# Patient Record
Sex: Female | Born: 1935
Health system: Southern US, Community
[De-identification: ages and names within clinical notes are randomized; demographics above are authoritative.]

## PROBLEM LIST (undated history)

## (undated) DIAGNOSIS — R233 Spontaneous ecchymoses: Secondary | ICD-10-CM

## (undated) DIAGNOSIS — M1990 Unspecified osteoarthritis, unspecified site: Secondary | ICD-10-CM

## (undated) DIAGNOSIS — E785 Hyperlipidemia, unspecified: Secondary | ICD-10-CM

## (undated) DIAGNOSIS — I2510 Atherosclerotic heart disease of native coronary artery without angina pectoris: Secondary | ICD-10-CM

## (undated) DIAGNOSIS — R5383 Other fatigue: Secondary | ICD-10-CM

## (undated) DIAGNOSIS — R0602 Shortness of breath: Secondary | ICD-10-CM

## (undated) DIAGNOSIS — I10 Essential (primary) hypertension: Secondary | ICD-10-CM

## (undated) DIAGNOSIS — R002 Palpitations: Secondary | ICD-10-CM

## (undated) DIAGNOSIS — R238 Other skin changes: Secondary | ICD-10-CM

## (undated) DIAGNOSIS — M25569 Pain in unspecified knee: Secondary | ICD-10-CM

## (undated) DIAGNOSIS — E669 Obesity, unspecified: Secondary | ICD-10-CM

## (undated) DIAGNOSIS — M549 Dorsalgia, unspecified: Secondary | ICD-10-CM

## (undated) HISTORY — DX: Essential (primary) hypertension: I10

## (undated) HISTORY — DX: Palpitations: R00.2

## (undated) HISTORY — DX: Other fatigue: R53.83

## (undated) HISTORY — DX: Hyperlipidemia, unspecified: E78.5

## (undated) HISTORY — PX: VAGINAL HYSTERECTOMY: SUR661

## (undated) HISTORY — DX: Spontaneous ecchymoses: R23.3

## (undated) HISTORY — DX: Other skin changes: R23.8

## (undated) HISTORY — DX: Dorsalgia, unspecified: M54.9

## (undated) HISTORY — DX: Pain in unspecified knee: M25.569

## (undated) HISTORY — DX: Unspecified osteoarthritis, unspecified site: M19.90

## (undated) HISTORY — DX: Atherosclerotic heart disease of native coronary artery without angina pectoris: I25.10

## (undated) HISTORY — PX: BREAST BIOPSY: SHX20

## (undated) HISTORY — DX: Shortness of breath: R06.02

## (undated) HISTORY — DX: Obesity, unspecified: E66.9

---

## 1980-12-18 HISTORY — PX: OTHER SURGICAL HISTORY: SHX169

## 1998-04-22 ENCOUNTER — Other Ambulatory Visit: Admission: RE | Admit: 1998-04-22 | Discharge: 1998-04-22 | Payer: Self-pay | Admitting: *Deleted

## 1998-08-17 ENCOUNTER — Ambulatory Visit (HOSPITAL_COMMUNITY): Admission: RE | Admit: 1998-08-17 | Discharge: 1998-08-17 | Payer: Self-pay | Admitting: *Deleted

## 1999-08-22 ENCOUNTER — Emergency Department (HOSPITAL_COMMUNITY): Admission: EM | Admit: 1999-08-22 | Discharge: 1999-08-22 | Payer: Self-pay | Admitting: Emergency Medicine

## 1999-08-22 ENCOUNTER — Encounter: Payer: Self-pay | Admitting: Emergency Medicine

## 1999-11-25 ENCOUNTER — Ambulatory Visit (HOSPITAL_COMMUNITY): Admission: RE | Admit: 1999-11-25 | Discharge: 1999-11-25 | Payer: Self-pay | Admitting: *Deleted

## 1999-11-25 ENCOUNTER — Encounter: Payer: Self-pay | Admitting: *Deleted

## 2000-06-12 ENCOUNTER — Ambulatory Visit (HOSPITAL_COMMUNITY): Admission: RE | Admit: 2000-06-12 | Discharge: 2000-06-12 | Payer: Self-pay | Admitting: Gastroenterology

## 2000-11-27 ENCOUNTER — Ambulatory Visit (HOSPITAL_COMMUNITY): Admission: RE | Admit: 2000-11-27 | Discharge: 2000-11-27 | Payer: Self-pay | Admitting: *Deleted

## 2000-12-26 ENCOUNTER — Ambulatory Visit (HOSPITAL_COMMUNITY): Admission: RE | Admit: 2000-12-26 | Discharge: 2000-12-26 | Payer: Self-pay | Admitting: Gastroenterology

## 2001-12-02 ENCOUNTER — Ambulatory Visit (HOSPITAL_COMMUNITY): Admission: RE | Admit: 2001-12-02 | Discharge: 2001-12-02 | Payer: Self-pay | Admitting: *Deleted

## 2002-12-03 ENCOUNTER — Ambulatory Visit (HOSPITAL_COMMUNITY): Admission: RE | Admit: 2002-12-03 | Discharge: 2002-12-03 | Payer: Self-pay | Admitting: Internal Medicine

## 2002-12-03 ENCOUNTER — Encounter: Payer: Self-pay | Admitting: Internal Medicine

## 2003-02-24 ENCOUNTER — Encounter: Payer: Self-pay | Admitting: Internal Medicine

## 2003-02-24 ENCOUNTER — Encounter: Admission: RE | Admit: 2003-02-24 | Discharge: 2003-02-24 | Payer: Self-pay | Admitting: Internal Medicine

## 2003-12-07 ENCOUNTER — Ambulatory Visit (HOSPITAL_COMMUNITY): Admission: RE | Admit: 2003-12-07 | Discharge: 2003-12-07 | Payer: Self-pay | Admitting: Gynecology

## 2004-12-08 ENCOUNTER — Ambulatory Visit (HOSPITAL_COMMUNITY): Admission: RE | Admit: 2004-12-08 | Discharge: 2004-12-08 | Payer: Self-pay | Admitting: Internal Medicine

## 2005-03-29 ENCOUNTER — Other Ambulatory Visit: Admission: RE | Admit: 2005-03-29 | Discharge: 2005-03-29 | Payer: Self-pay | Admitting: Gynecology

## 2006-01-26 ENCOUNTER — Ambulatory Visit (HOSPITAL_COMMUNITY): Admission: RE | Admit: 2006-01-26 | Discharge: 2006-01-26 | Payer: Self-pay | Admitting: Internal Medicine

## 2006-02-22 ENCOUNTER — Encounter: Admission: RE | Admit: 2006-02-22 | Discharge: 2006-02-22 | Payer: Self-pay | Admitting: Internal Medicine

## 2006-05-11 ENCOUNTER — Inpatient Hospital Stay (HOSPITAL_BASED_OUTPATIENT_CLINIC_OR_DEPARTMENT_OTHER): Admission: RE | Admit: 2006-05-11 | Discharge: 2006-05-11 | Payer: Self-pay | Admitting: Cardiology

## 2006-05-11 HISTORY — PX: CARDIAC CATHETERIZATION: SHX172

## 2007-03-19 ENCOUNTER — Encounter: Admission: RE | Admit: 2007-03-19 | Discharge: 2007-03-19 | Payer: Self-pay | Admitting: Internal Medicine

## 2008-03-19 ENCOUNTER — Encounter: Admission: RE | Admit: 2008-03-19 | Discharge: 2008-03-19 | Payer: Self-pay | Admitting: Internal Medicine

## 2008-08-29 ENCOUNTER — Encounter: Admission: RE | Admit: 2008-08-29 | Discharge: 2008-08-29 | Payer: Self-pay | Admitting: Orthopaedic Surgery

## 2008-09-18 ENCOUNTER — Encounter: Admission: RE | Admit: 2008-09-18 | Discharge: 2008-09-18 | Payer: Self-pay | Admitting: Orthopaedic Surgery

## 2008-09-22 ENCOUNTER — Ambulatory Visit (HOSPITAL_BASED_OUTPATIENT_CLINIC_OR_DEPARTMENT_OTHER): Admission: RE | Admit: 2008-09-22 | Discharge: 2008-09-22 | Payer: Self-pay | Admitting: Orthopaedic Surgery

## 2008-09-22 HISTORY — PX: KNEE ARTHROSCOPY: SUR90

## 2008-11-04 ENCOUNTER — Ambulatory Visit: Payer: Self-pay | Admitting: Vascular Surgery

## 2009-05-12 ENCOUNTER — Encounter: Admission: RE | Admit: 2009-05-12 | Discharge: 2009-05-12 | Payer: Self-pay | Admitting: Internal Medicine

## 2010-05-25 ENCOUNTER — Encounter: Admission: RE | Admit: 2010-05-25 | Discharge: 2010-05-25 | Payer: Self-pay | Admitting: Internal Medicine

## 2011-03-13 ENCOUNTER — Other Ambulatory Visit: Payer: Self-pay | Admitting: Cardiology

## 2011-03-16 NOTE — Telephone Encounter (Signed)
[facility] °

## 2011-04-14 ENCOUNTER — Other Ambulatory Visit: Payer: Self-pay | Admitting: Cardiology

## 2011-04-14 DIAGNOSIS — I10 Essential (primary) hypertension: Secondary | ICD-10-CM

## 2011-04-14 MED ORDER — METOPROLOL TARTRATE 100 MG PO TABS: 100.0000 mg | ORAL_TABLET | Freq: Two times a day (BID) | ORAL | Status: DC

## 2011-04-14 NOTE — Telephone Encounter (Signed)
LM on Home number that prescription for Metoprolol was called in to [facility] in [place].

## 2011-04-14 NOTE — Telephone Encounter (Signed)
PATIENT OUT OF METOPROLOL USES [facility] IN [place] [phone]. CALL PT AND LET HER KNOW. SAID SHE HAD ALREADY MENTIONED IT TO DR Deborah Chalk.

## 2011-05-02 NOTE — Op Note (Signed)
Tanya Ho, Tanya Ho                  ACCOUNT NO.:  [id number]   MEDICAL RECORD NO.:  [id number]          PATIENT TYPE:  AMB   LOCATION:  DSC                          FACILITY:  [facility]   PHYSICIAN:  Lubertha Basque. Dalldorf, M.D.DATE OF BIRTH:  [birth year]   DATE OF PROCEDURE:  09/22/2008  DATE OF DISCHARGE:                               OPERATIVE REPORT   PREOPERATIVE DIAGNOSES:  1. Right knee torn lateral and torn medial menisci.  2. Right knee chondromalacia patella.   POSTOPERATIVE DIAGNOSES:  1. Right knee torn lateral and torn medial menisci.  2. Right knee chondromalacia patella.   PROCEDURES:  1. Right knee partial medial and partial lateral meniscectomy.  2. Right knee abrasion chondroplasty, patellofemoral.   ANESTHESIA:  General.   ATTENDING SURGEON:  Lubertha Basque. Jerl Santos, MD   ASSISTANT:  Lindwood Qua, PA   INDICATIONS FOR PROCEDURE:  The patient is an 77 year old woman with a  long history of right knee pain.  This has persisted despite oral anti-  inflammatories and 2 injections.  By MRI scan, she has things amenable  to arthroscopy, though she does have some mild arthritic change on plain  films.  She has pain which limits her ability to rest and walk and she  is offered an arthroscopy.  Informed operative consent was obtained  after discussion of possible complications including reaction to  anesthesia and infection.   SUMMARY/FINDINGS/PROCEDURE:  Under general anesthesia, a right knee  arthroscopy was performed.  Suprapatellar pouch was benign while the  patellofemoral joint exhibited some grade 3 change across broad areas  and focal grade 4 change at the apex of the patella addressed with an  abrasion chondroplasty to bleeding bone in tiny areas.  The medial  compartment exhibited a degenerative tear of the posterior horn of the  medial meniscus addressed with a 5% partial medial meniscectomy.  The  lateral compartment exhibited a significant tear involving most of  the  meniscus.  The entire posterior horn was removed and a portion of the  middle horn taking about 50% of the lateral meniscus.  She had some  grade 4 change focally on the posterior aspect of the femoral condyle  addressed with a chondroplasty.  The ACL appeared to be intact.   DESCRIPTION OF PROCEDURE:  The patient was taken to the operating suite  where general anesthetic was applied without difficulty.  She was  positioned supine and prepped and draped in normal sterile fashion.  After the administration of IV Kefzol, an arthroscopy of the right knee  was performed through total of 2 portals.  Findings were as noted above  and procedure consisted of the medial and partial lateral meniscectomies  as described.  This was done with baskets and shavers.  I also performed  the chondroplasty and abrasion to bleeding bone, patellofemoral.  The  knee was thoroughly irrigated followed by placement of Marcaine with  epinephrine and morphine.  Adaptic was placed over the portals followed  by dry gauze and loose Ace wrap.  Estimated blood loss and  intraoperative fluids can be obtained from anesthesia  records.   DISPOSITION:  The patient was extubated in the operating room and taken  to recovery in stable addition.  She wished to go home same-day and  follow up in the office closely.  I will contact her by phone tonight.      Lubertha Basque Jerl Santos, M.D.  Electronically Signed     PGD/MEDQ  D:  09/22/2008  T:  09/22/2008  Job:  [id number]

## 2011-05-02 NOTE — Procedures (Signed)
CAROTID DUPLEX EXAM   INDICATION:  Right carotid bruit (per encounter form).   HISTORY:  Diabetes:  No.  Cardiac:  No.  Hypertension:  Yes.  Smoking:  No.  Previous Surgery:  No.  CV History:  Asymptomatic.  Amaurosis Fugax No, Paresthesias No, Hemiparesis No.                                       RIGHT             LEFT  Brachial systolic pressure:         138               142  Brachial Doppler waveforms:         Normal            Normal  Vertebral direction of flow:        Antegrade         Antegrade  DUPLEX VELOCITIES (cm/sec)  CCA peak systolic                   105               79  ECA peak systolic                   79                60  ICA peak systolic                   76                103  ICA end diastolic                   20                20  PLAQUE MORPHOLOGY:                  Mixed             Mixed  PLAQUE AMOUNT:                      Mild              Mild  PLAQUE LOCATION:                    ICA/ECA           ICA/ECA   IMPRESSION:  1. 1-39% stenosis of the bilateral internal carotid arteries.  2. No significant change in Doppler velocities noted when compared to      the previous examination on 07/31/2006.   Preliminary report was faxed to Dr. Ferd Hibbs office on 11/04/2008.   ___________________________________________  Larina Earthly, M.D.   CH/MEDQ  D:  11/04/2008  T:  11/04/2008  Job:  [id number]

## 2011-05-04 ENCOUNTER — Encounter: Payer: Self-pay | Admitting: Cardiology

## 2011-05-04 DIAGNOSIS — R233 Spontaneous ecchymoses: Secondary | ICD-10-CM | POA: Insufficient documentation

## 2011-05-04 DIAGNOSIS — I2510 Atherosclerotic heart disease of native coronary artery without angina pectoris: Secondary | ICD-10-CM | POA: Insufficient documentation

## 2011-05-04 DIAGNOSIS — R5383 Other fatigue: Secondary | ICD-10-CM | POA: Insufficient documentation

## 2011-05-04 DIAGNOSIS — M549 Dorsalgia, unspecified: Secondary | ICD-10-CM | POA: Insufficient documentation

## 2011-05-04 DIAGNOSIS — R238 Other skin changes: Secondary | ICD-10-CM | POA: Insufficient documentation

## 2011-05-04 DIAGNOSIS — M1990 Unspecified osteoarthritis, unspecified site: Secondary | ICD-10-CM | POA: Insufficient documentation

## 2011-05-04 DIAGNOSIS — E785 Hyperlipidemia, unspecified: Secondary | ICD-10-CM | POA: Insufficient documentation

## 2011-05-04 DIAGNOSIS — I10 Essential (primary) hypertension: Secondary | ICD-10-CM | POA: Insufficient documentation

## 2011-05-04 DIAGNOSIS — R002 Palpitations: Secondary | ICD-10-CM | POA: Insufficient documentation

## 2011-05-04 DIAGNOSIS — M25569 Pain in unspecified knee: Secondary | ICD-10-CM | POA: Insufficient documentation

## 2011-05-04 DIAGNOSIS — R0602 Shortness of breath: Secondary | ICD-10-CM | POA: Insufficient documentation

## 2011-05-04 DIAGNOSIS — E669 Obesity, unspecified: Secondary | ICD-10-CM | POA: Insufficient documentation

## 2011-05-05 ENCOUNTER — Encounter: Payer: Self-pay | Admitting: Cardiology

## 2011-05-05 ENCOUNTER — Ambulatory Visit (INDEPENDENT_AMBULATORY_CARE_PROVIDER_SITE_OTHER): Payer: Medicare Other | Admitting: Cardiology

## 2011-05-05 DIAGNOSIS — I2510 Atherosclerotic heart disease of native coronary artery without angina pectoris: Secondary | ICD-10-CM

## 2011-05-05 DIAGNOSIS — R002 Palpitations: Secondary | ICD-10-CM

## 2011-05-05 DIAGNOSIS — E669 Obesity, unspecified: Secondary | ICD-10-CM

## 2011-05-05 NOTE — Assessment & Plan Note (Signed)
She will remain on Plavix therapy.

## 2011-05-05 NOTE — Assessment & Plan Note (Signed)
I encouraged her to lose weight.

## 2011-05-05 NOTE — H&P (Signed)
Tanya Ho, Tanya Ho                  ACCOUNT NO.:  [id number]   MEDICAL RECORD NO.:  [id number]           PATIENT TYPE:   LOCATION:                                 FACILITY:   PHYSICIAN:  Colleen Can. Deborah Chalk, M.D.    DATE OF BIRTH:   DATE OF ADMISSION:  05/11/2006  DATE OF DISCHARGE:                                HISTORY & PHYSICAL   CHIEF COMPLAINT:  Palpitations and jaw pain.   HISTORY OF PRESENT ILLNESS:  Patient is a very pleasant 77 year old female  who has multiple cardiovascular risk factors.  She presented to our office  as a work-in appointment on May 09, 2006 from the gynecology office.  She  has had complaints of palpitations over the past two weeks off and on.  She  felt like it was secondary to stress and nerves.  It basically subsides with  the use of Xanax.  She has not been lightheaded or dizzy.  She has noted a  significant degree of weakness and fatigue.  She has also had some  intermittent left jaw pain.  She is not sure if it has been exertional in  nature.  She was subsequently seen in the office and referred for repeat  catheterization.   PAST MEDICAL HISTORY:  1.  Known ischemic heart disease.  She had catheterization in 1996 which      showed the right coronary to have a 40-50% narrowing in the proximal      portion.  There was mild disease otherwise with normal LV function.  2.  Chronic history of palpitations.  3.  Obesity.  4.  Hyperlipidemia.  5.  Chronic fatigue.  6.  History of childbirth.  7.  Nephrolithotomy in 1982.  8.  Breast biopsy in 1980.  9.  Vaginal hysterectomy in 1979.   ALLERGIES:  DEMEROL, MORPHINE, and CODEINE.   CURRENT MEDICATIONS:  1.  Reglan p.r.n.  2.  Xanax p.r.n.  3.  Nexium 40 mg a day.  4.  Lipitor 10 mg a day.  5.  Dyazide 37.5/25 a day.  6.  Aspirin daily.  7.  Lopressor 100 b.i.d.   FAMILY HISTORY:  Father died at 15 due to acute myocardial infarction.  Her  mother died at the age of 70.   SOCIAL HISTORY:  She  is married.  She has no current alcohol or tobacco use.   REVIEW OF SYSTEMS:  As noted above, is otherwise unremarkable.   PHYSICAL EXAMINATION:  GENERAL:  She is a very pleasant, somewhat anxious  white female.  She is overweight.  VITAL SIGNS:  Blood pressure 122/80 sitting, 130/80 standing, heart rate 65  and regular, respirations 18.  She is afebrile.  SKIN:  Warm and dry.  Color is unremarkable.  LUNGS:  Clear.  HEART:  Regular rhythm.  She is obese.  ABDOMEN:  Soft.  Positive bowel sounds.  Nontender.  EXTREMITIES:  Full with no significant edema.   Pertinent laboratories are pending.   OVERALL IMPRESSION:  1.  Multiple of somatic complaints.  2.  History of known ischemic  heart disease with previous catheterization 11      years ago.  3.  Obesity.  4.  Hyperlipidemia.  5.  Chronic palpitations.   PLAN:  Will proceed on with cardiac catheterization.  The procedure has been  reviewed in full detail and she is willing to proceed on Friday, May 11, 2006.      Sharlee Blew, N.P.      Colleen Can. Deborah Chalk, M.D.  Electronically Signed    LC/MEDQ  D:  05/09/2006  T:  05/09/2006  Job:  [id number]   cc:   Gwen Pounds, MD  Fax: [phone]

## 2011-05-05 NOTE — Assessment & Plan Note (Signed)
She will remain on the metoprololl 100 mg twice a day.in general, palpitations or nonsignificant on her medications.

## 2011-05-05 NOTE — Procedures (Signed)
[place]  Patient:    Tanya Ho, Tanya Ho                           MRN: [id number] Proc. Date: 06/12/00 Adm. Date:  [id number] Attending:  Orland Mustard CC:         Georg Ruddle. Viviann Spare, M.D.                           Procedure Report  PROCEDURE PERFORMED:  Colonoscopy and biopsy.  ENDOSCOPIST:  Llana Aliment. Randa Evens, M.D.  MEDICATIONS USED:  Fentanyl 140 mcg, Versed 10 mg IV.  INSTRUMENT:  Adult Olympus video colonoscope.  INDICATIONS:  Rectal bleeding.  DESCRIPTION OF PROCEDURE:  The procedure had been explained to the patient and consent obtained.  With the patient in the left lateral decubitus position, the Olympus adult video colonoscope was inserted and advanced under direct visualization.  The prep was quite good and we were able to advance to the sigmoid colon.  The patient had moderate diverticulosis in the sigmoid colon. After we advanced this, we advanced over the hepatic flexure and using abdominal pressure, we were able to advance down in cecum.   The ileocecal valve was seen.  The scope was withdrawn.  The cecum, ascending colon were normal.  The mucosa was completely normal throughout the entire colonoscopy, with no evidence of colitis, edema, etc.  No polyps were seen in the ascending colon, transverse colon, descending or sigmoid colon.  Moderate diverticular disease in the sigmoid colon.  Multiple random biopsies were taken throughout the entire colon.  The scope was withdrawn into the rectum.  Internal hemorrhoids were seen in the rectum upon removal of the scope.  Scope withdrawn, patient tolerated the procedure well.  Maintained on low flow oxygen and pulse oximeter throughout the procedure with no obvious problem.  ASSESSMENT: 1. Rectal bleeding probably due to internal hemorrhoids. 2. Diverticulosis of a moderate nature. 3. Colon grossly normal, no obvious cause for chronic diarrhea.  PLAN: 1. Will check the pathology  report for the random colon biopsies and see back    in the office in three to four weeks. 2. Will give instructions for hemorrhoids. DD:  06/12/00 TD:  06/13/00 Job: [id number]

## 2011-05-05 NOTE — Progress Notes (Signed)
Subjective:   Is as follows seen today for followup visit. In general, she's been doing reasonably well she does have occasional palpitations that have improved with chronic beta blocker therapy. She's been on chronic Plavix therapy for history of moderate diffuse coronary atherosclerosis with catheterization in 2007. Overall, she's done reasonably well since that time. She is history of hypertension with known LVH a 2-D echocardiogram since 2006. Followup 2-D echo in 2010 showed a septum of 13 mm and posterior wall thickness of 12 mm with mild aortic valve thickening. She also has a history of hyperlipemia and has been on  Atorvastatin.overall, she's feeling well except for some shortness of breath.  Current Outpatient Prescriptions  Medication Sig Dispense Refill  . ALPRAZolam (XANAX) 0.5 MG tablet Take 0.5 mg by mouth at bedtime.        . Ascorbic Acid (VITAMIN C PO) Take by mouth daily.        Marland Kitchen aspirin 81 MG tablet Take 81 mg by mouth daily.        Marland Kitchen atorvastatin (LIPITOR) 20 MG tablet Take 20 mg by mouth daily.        Marland Kitchen CALCIUM PO Take by mouth 2 (two) times daily.        . clopidogrel (PLAVIX) 75 MG tablet Take 75 mg by mouth daily.        . ergocalciferol (VITAMIN D2) 50000 UNITS capsule Take 50,000 Units by mouth once a week.        . esomeprazole (NEXIUM) 40 MG capsule Take 40 mg by mouth daily before breakfast.        . furosemide (LASIX) 20 MG tablet Take 20 mg by mouth 2 (two) times daily.        . Ibuprofen (ADVIL PO) Take 400 mg by mouth as needed.       . metoprolol (LOPRESSOR) 100 MG tablet Take 1 tablet (100 mg total) by mouth 2 (two) times daily.  60 tablet  6  . triamterene-hydrochlorothiazide (DYAZIDE) 37.5-25 MG per capsule Take 1 capsule by mouth every morning.        Marland Kitchen DISCONTD: Metoclopramide HCl (REGLAN PO) Take by mouth as needed.          Allergies  Allergen Reactions  . Codeine   . Demerol   . Morphine And Related     Patient Active Problem List  Diagnoses  .  SOB (shortness of breath)  . Coronary artery disease  . Palpitations  . Fatigue  . Back pain  . Knee pain  . Hyperlipidemia  . Bruises easily  . OA (osteoarthritis)  . Obesity  . Hypertension    History  Smoking status  . Never Smoker   Smokeless tobacco  . Never Used    History  Alcohol Use No    Family History  Problem Relation Age of Onset  . Heart disease Mother   . Stroke Mother   . Heart attack Father     Review of Systems:   The patient denies any heat or cold intolerance.  No weight gain or weight loss.  The patient denies headaches or blurry vision.  There is no cough or sputum production.  The patient denies dizziness.  There is no hematuria or hematochezia.  The patient denies any muscle aches or arthritis.  The patient denies any rash.  The patient denies frequent falling or instability.  There is no history of depression or anxiety.  All other systems were reviewed and are negative.  Physical Exam:   Blood pressure 118/66. R. Rate 64. Weight is 202 pounds. BMI is 32.6.The head is normocephalic and atraumatic.  Pupils are equally round and reactive to light.  Sclerae nonicteric.  Conjunctiva is clear.  Oropharynx is unremarkable.  There's adequate oral airway.  Neck is supple there are no masses.  Thyroid is not enlarged.  There is no lymphadenopathy.  Lungs are clear.  Chest is symmetric.  Heart shows a regular rate and rhythm.  S1 and S2 are normal.  There is a soft systolic outflow murmur.  Abdomen is soft normal bowel sounds.  There is no organomegaly.  Genital and rectal deferred.  Extremities are without edema.  Peripheral pulses are adequate.  Neurologically intact.  Full range of motion.  The patient is not depressed.  Skin is warm and dry. Assessment / Plan:

## 2011-05-05 NOTE — Cardiovascular Report (Signed)
Tanya Ho, Tanya Ho                  ACCOUNT NO.:  [id number]   MEDICAL RECORD NO.:  [id number]          PATIENT TYPE:  OIB   LOCATION:  1961                         FACILITY:  [facility]   PHYSICIAN:  Colleen Can. Deborah Chalk, M.D.DATE OF BIRTH:  06-05-1926   DATE OF PROCEDURE:  05/11/2006  DATE OF DISCHARGE:  05/11/2006                              CARDIAC CATHETERIZATION   HISTORY:  Tanya Ho presents with atypical chest pain, weakness and fatigue.  She had known coronary disease on a catheterization in 1996.  She has a  history of chronic palpitations, hypertension, obesity, chronic fatigue and  anxiety.  She had a negative stress test but continue to have symptoms.   PROCEDURE:  Left heart catheterization with selective coronary angiography,  left ventricular angiography.   CARDIOLOGIST:  Colleen Can. Deborah Chalk, M.D.   TYPE AND SITE OF ENTRY:  Percutaneous right femoral artery.   CATHETERS:  4-French four curved Judkins right coronary catheters, 4-French  pigtail ventriculographic catheter.   CONTRAST MATERIAL:  Omnipaque.   MEDICATIONS PRIOR TO PROCEDURE:  Medications given prior to the procedure  are Valium 10 mg p.o.   MEDICATIONS DURING PROCEDURE:  Verse 4 mg IV.   COMMENTS:  The patient tolerated the procedure well.   HEMODYNAMIC DATA:  The aortic pressure was 141/67, LV pressure 142/5-10.  There was no aortic valve gradient in all pullback although it was difficult  to cross the aortic valve.   LEFT VENTRICULAR ANGIOGRAM:  Left ventricular angiogram was performed in the  RAO position.  Overall cardiac size and silhouette are normal.  Overall  global ejection fraction can be estimated in the 60% range.  It is felt to  be regional wall motion.  There is no intracardiac calcification or abnormal  contraction pattern.   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   1.  Left anterior  descending.  The left anterior descending is a moderately      large vessel that crosses the apex.  There are two high diagonal      vessels.  There is lumpy-bumpy somewhat diffuse coronary      atherosclerosis scattered throughout the entire left anterior      descending.  There is no greater than 30-40% narrowing in the body of      the left anterior descending.  The second diagonal vessel may have a 60-      70% focal narrowing.  It is somewhat of a small vessel.   1.  Right coronary artery:  The right coronary artery is a dominant vessel.      There is calcification of the right coronary artery.  It has a somewhat      diffuse coronary atherosclerosis and calcification once again and      somewhat of a lump-bumpy fashion.  There appears to be satisfactory      flow and no greater than 50% narrowing as the vessels extends beyond the      acute margin of the heart before  the crux.   1.  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  It arises from the right      coronary artery ostium.  We could not selectively engage that.  Flow      appeared to be satisfactory in the multiple pictures.  There was normal      flow without obstructive disease.   OVERALL IMPRESSION:  1.  Normal left ventricular function.  2.  There is extensive somewhat diffuse coronary atherosclerosis with      calcification in the left anterior descending and right coronary artery      with somewhat diffuse scattered irregularities, with calcification.   DISCUSSION:  It is felt that Tanya Ho can be best managed medically.  It may  be that she would be a candidate for Plavix in addition to her aspirin  because of the somewhat diffuseness of her coronary atherosclerosis.  It is  not felt that she would have obstructive disease leading to angina but one  cannot exclude the possibility of platelet emboli given the diffuse nature  of the  atherosclerosis.      Colleen Can. Deborah Chalk, M.D.  Electronically Signed     SNT/MEDQ  D:  05/11/2006  T:  05/12/2006  Job:  [id number]

## 2011-05-05 NOTE — Procedures (Signed)
[facility]  Patient:    Tanya Ho, ARPS                      MRN: [id number] Proc. Date: 12/26/00 Adm. Date:  [id number] Attending:  Orland Mustard CC:         Lilia Pro, M.D.   Procedure Report  PROCEDURE:  Esophagogastroduodenoscopy with biopsy.  MEDICATIONS:  Fentanyl 67.5 mcg, Versed 6 mg IV, Cetacaine spray.  INDICATIONS:  Persistent dyspepsia, despite aggressive therapy with Nexium (was sent back for endoscopy).  DESCRIPTION OF PROCEDURE:  The procedure had been explained to the patient, consent was obtained.  With the patient in the left lateral decubitus position, the Olympus video endoscope was inserted and advanced under direct visualization.  The stomach was entered, the pylorus identified and passed, the duodenum, the bulb and the second portion were seen well and were unremarkable.  The scope was withdrawn back into the stomach.  There were several gastric erosions and marked erythema and irritation consistent with gastritis.  No ulcers were seen. The body was basically normal.  A biopsy was taken for rapid urease test for Helicobacter.  The fundus and cardia were seen well in the retroflex view and were normal.  There was a hiatal hernia, 2-3 cm in size, with a small ulcer right at the junction between the hiatal hernia and esophagus.  This was a very small ulcer but clearly was there and did appear to be healing.  The distal esophagus was somewhat reddened.  The scope was withdrawn.  The proximal esophagus was seen well upon withdrawal and was normal.  The patient tolerated the procedure well and was maintained on low-flow oxygen and pulse oximetry.  Throughout the procedure there were no obvious problems.  ASSESSMENT: 1. Esophageal ulcer, with esophagitis probably due to esophageal reflux    disease. 2. Gastritis with erosions.  Rule out Helicobacter.  PLAN:  Will continue Nexium.  Will add Reglan.  Give antireflux  instructions and see her back in our office in four to six weeks.  ASSESSMENT: 1. Normal colonoscopy, other than occasional internal hemorrhoids.  RECOMMEND:  Repeating colonoscopy in five years due to her family history. DD:  12/26/00 TD:  12/26/00 Job: [id number]

## 2011-05-10 ENCOUNTER — Other Ambulatory Visit: Payer: Self-pay | Admitting: Internal Medicine

## 2011-05-10 DIAGNOSIS — Z1231 Encounter for screening mammogram for malignant neoplasm of breast: Secondary | ICD-10-CM

## 2011-05-29 ENCOUNTER — Ambulatory Visit: Payer: Medicare Other

## 2011-05-29 ENCOUNTER — Ambulatory Visit
Admission: RE | Admit: 2011-05-29 | Discharge: 2011-05-29 | Disposition: A | Discharge status: Home / Self Care | Payer: Medicare Other | Source: Ambulatory Visit | Attending: Internal Medicine | Admitting: Internal Medicine

## 2011-05-29 DIAGNOSIS — Z1231 Encounter for screening mammogram for malignant neoplasm of breast: Secondary | ICD-10-CM

## 2011-09-19 LAB — BASIC METABOLIC PANEL
Calcium: 9.1
Chloride: 104
GFR calc Af Amer: >60
GFR calc non Af Amer: >60
Glucose, Bld: 134 — ABNORMAL HIGH
Potassium: 4.2

## 2011-12-18 ENCOUNTER — Other Ambulatory Visit: Payer: Self-pay | Admitting: Cardiology

## 2012-01-09 ENCOUNTER — Other Ambulatory Visit: Payer: Self-pay | Admitting: Dermatology

## 2012-01-09 DIAGNOSIS — D0460 Carcinoma in situ of skin of unspecified upper limb, including shoulder: Secondary | ICD-10-CM | POA: Diagnosis not present

## 2012-01-09 DIAGNOSIS — L570 Actinic keratosis: Secondary | ICD-10-CM | POA: Diagnosis not present

## 2012-01-09 DIAGNOSIS — D485 Neoplasm of uncertain behavior of skin: Secondary | ICD-10-CM | POA: Diagnosis not present

## 2012-01-09 DIAGNOSIS — L821 Other seborrheic keratosis: Secondary | ICD-10-CM | POA: Diagnosis not present

## 2012-01-09 DIAGNOSIS — Z85828 Personal history of other malignant neoplasm of skin: Secondary | ICD-10-CM | POA: Diagnosis not present

## 2012-03-04 DIAGNOSIS — D0460 Carcinoma in situ of skin of unspecified upper limb, including shoulder: Secondary | ICD-10-CM | POA: Diagnosis not present

## 2012-05-13 ENCOUNTER — Other Ambulatory Visit: Payer: Self-pay | Admitting: Nurse Practitioner

## 2012-05-14 DIAGNOSIS — R82998 Other abnormal findings in urine: Secondary | ICD-10-CM | POA: Diagnosis not present

## 2012-05-14 DIAGNOSIS — I10 Essential (primary) hypertension: Secondary | ICD-10-CM | POA: Diagnosis not present

## 2012-05-14 DIAGNOSIS — H3530 Unspecified macular degeneration: Secondary | ICD-10-CM | POA: Diagnosis not present

## 2012-05-14 DIAGNOSIS — R300 Dysuria: Secondary | ICD-10-CM | POA: Diagnosis not present

## 2012-05-14 DIAGNOSIS — R7301 Impaired fasting glucose: Secondary | ICD-10-CM | POA: Diagnosis not present

## 2012-05-14 DIAGNOSIS — E785 Hyperlipidemia, unspecified: Secondary | ICD-10-CM | POA: Diagnosis not present

## 2012-05-14 DIAGNOSIS — E669 Obesity, unspecified: Secondary | ICD-10-CM | POA: Diagnosis not present

## 2012-06-06 ENCOUNTER — Encounter: Payer: Self-pay | Admitting: Cardiovascular Disease

## 2012-06-06 ENCOUNTER — Ambulatory Visit (INDEPENDENT_AMBULATORY_CARE_PROVIDER_SITE_OTHER): Payer: Medicare Other | Admitting: Cardiovascular Disease

## 2012-06-06 VITALS — BP 134/72 | HR 75 | Ht 66.0 in | Wt 198.8 lb

## 2012-06-06 DIAGNOSIS — I2510 Atherosclerotic heart disease of native coronary artery without angina pectoris: Secondary | ICD-10-CM

## 2012-06-06 MED ORDER — METOPROLOL TARTRATE 100 MG PO TABS: 100.0000 mg | ORAL_TABLET | Freq: Two times a day (BID) | ORAL | Status: DC

## 2012-06-06 NOTE — Progress Notes (Addendum)
Tanya Ho Date of Birth  1926/01/28       [facility]    [place] [address]  [address] [place], [place]  [address]   [place], [place]  [address] [phone]     [phone]   Fax  [phone]    Fax [phone]  Problem List: 1. Mild coronary artery disease 2. Hypertension 3. Hyperlipidemia 4. Obesity 5. Osteoarthritis 6. Hiatal Hernia /  GERD  History of Present Illness:  Tanya Ho is an 77 y.o. female with hx as noted above.  She does her normal chores without difficulties but does not do any regular exercise.  She denies any chest pain or shortness breath. She denies any syncope or presyncope.  Current Outpatient Prescriptions on File Prior to Visit  Medication Sig Dispense Refill  . ALPRAZolam (XANAX) 0.5 MG tablet Take 0.5 mg by mouth at bedtime.        Marland Kitchen aspirin 81 MG tablet Take 81 mg by mouth daily.        Marland Kitchen atorvastatin (LIPITOR) 20 MG tablet Take 20 mg by mouth daily.        . clopidogrel (PLAVIX) 75 MG tablet TAKE 1 TABLET BY MOUTH ONCE A DAY  30 tablet  1  . ergocalciferol (VITAMIN D2) 50000 UNITS capsule Take 50,000 Units by mouth once a week.        . furosemide (LASIX) 20 MG tablet Take 20 mg by mouth 2 (two) times daily.        . Ibuprofen (ADVIL PO) Take 400 mg by mouth as needed.       . triamterene-hydrochlorothiazide (DYAZIDE) 37.5-25 MG per capsule Take 1 capsule by mouth every morning.        . metoprolol (LOPRESSOR) 100 MG tablet Take 1 tablet (100 mg total) by mouth 2 (two) times daily.  60 tablet  6    Allergies  Allergen Reactions  . Codeine   . Demerol   . Morphine And Related     Past Medical History  Diagnosis Date  . SOB (shortness of breath)   . Coronary artery disease     NONOBSTRUCTIVE  . Palpitations   . Fatigue   . Back pain   . Knee pain   . Hyperlipidemia   . Bruises easily   . OA (osteoarthritis)   . Obesity   . Hypertension     Past Surgical History  Procedure Date   . Cardiac catheterization 05/11/2006    OVERALL CARDIAC SIZE AND SILHOUETTE ARE NORMAL. EF ESTIMATED 60%  . Kidney stones 1982  . Breast biopsy     LEFT BREAST  . Vaginal hysterectomy   . Knee arthroscopy 09/22/2008    History  Smoking status  . Never Smoker   Smokeless tobacco  . Never Used    History  Alcohol Use No    Family History  Problem Relation Age of Onset  . Heart disease Mother   . Stroke Mother   . Heart attack Father     Reviw of Systems:  Reviewed in the HPI.  All other systems are negative.  Physical Exam: Blood pressure 134/72, pulse 75, height 5\' 6"  (1.676 m), weight 198 lb 12.8 oz (90.175 kg). General: Well developed, well nourished, in no acute distress.  Head: Normocephalic, atraumatic, sclera non-icteric, mucus membranes are moist,   Neck: Supple. Carotids are 2 + without bruits. No JVD  Lungs: Clear bilaterally to auscultation.  Heart: regular  rate.  normal  S1 S2. No murmurs, gallops or rubs.  Abdomen: Soft, non-tender, non-distended with normal bowel sounds. No hepatomegaly. No rebound/guarding. No masses.  Msk:  Strength and tone are normal  Extremities: No clubbing or cyanosis. No edema.  Distal pedal pulses are 2+ and equal bilaterally.  Neuro: Alert and oriented X 3. Moves all extremities spontaneously.  Psych:  Responds to questions appropriately with a normal affect.  ECG: 06/06/2012-normal sinus rhythm at 75 beats a minute. She has no ST or T wave changes.  Assessment / Plan:

## 2012-06-06 NOTE — Patient Instructions (Addendum)
Your physician wants you to follow-up in: 1 YEAR.  You will receive a reminder letter in the mail two months in advance. If you don't receive a letter, please call our office to schedule the follow-up appointment.  Your physician recommends that you continue on your current medications as directed. Please refer to the Current Medication list given to you today.  

## 2012-06-06 NOTE — Assessment & Plan Note (Signed)
Tanya Ho has moderate coronary artery disease but is relatively asymptomatic. She's had to go all of her normal activities without any significant problems. We'll continue with her same medications. I'll see her again in one year for followup office visit, EKG, and fasting lipids, and hepatic profile, and basic metabolic profile.

## 2012-07-09 ENCOUNTER — Other Ambulatory Visit: Payer: Self-pay | Admitting: Nurse Practitioner

## 2012-07-10 ENCOUNTER — Other Ambulatory Visit: Payer: Self-pay | Admitting: Internal Medicine

## 2012-07-10 DIAGNOSIS — Z1231 Encounter for screening mammogram for malignant neoplasm of breast: Secondary | ICD-10-CM

## 2012-07-19 DIAGNOSIS — H353190 Nonexudative age-related macular degeneration, unspecified eye, stage unspecified: Secondary | ICD-10-CM | POA: Diagnosis not present

## 2012-07-26 ENCOUNTER — Ambulatory Visit
Admission: RE | Admit: 2012-07-26 | Discharge: 2012-07-26 | Disposition: A | Discharge status: Home / Self Care | Payer: Medicare Other | Source: Ambulatory Visit | Attending: Internal Medicine | Admitting: Internal Medicine

## 2012-07-26 DIAGNOSIS — Z1231 Encounter for screening mammogram for malignant neoplasm of breast: Secondary | ICD-10-CM | POA: Diagnosis not present

## 2012-09-15 DIAGNOSIS — Z23 Encounter for immunization: Secondary | ICD-10-CM | POA: Diagnosis not present

## 2012-09-16 ENCOUNTER — Other Ambulatory Visit: Payer: Self-pay | Admitting: Cardiovascular Disease

## 2012-09-17 NOTE — Telephone Encounter (Signed)
Fax Received. Refill Completed. Tanya Ho (R.M.A)   

## 2012-11-13 DIAGNOSIS — I10 Essential (primary) hypertension: Secondary | ICD-10-CM | POA: Diagnosis not present

## 2012-11-13 DIAGNOSIS — M949 Disorder of cartilage, unspecified: Secondary | ICD-10-CM | POA: Diagnosis not present

## 2012-11-13 DIAGNOSIS — E785 Hyperlipidemia, unspecified: Secondary | ICD-10-CM | POA: Diagnosis not present

## 2012-11-13 DIAGNOSIS — I2510 Atherosclerotic heart disease of native coronary artery without angina pectoris: Secondary | ICD-10-CM | POA: Diagnosis not present

## 2012-11-13 DIAGNOSIS — R7301 Impaired fasting glucose: Secondary | ICD-10-CM | POA: Diagnosis not present

## 2012-11-13 DIAGNOSIS — R82998 Other abnormal findings in urine: Secondary | ICD-10-CM | POA: Diagnosis not present

## 2012-11-13 DIAGNOSIS — M899 Disorder of bone, unspecified: Secondary | ICD-10-CM | POA: Diagnosis not present

## 2012-11-18 ENCOUNTER — Other Ambulatory Visit: Payer: Self-pay | Admitting: *Deleted

## 2012-11-18 NOTE — Telephone Encounter (Signed)
Opened in Error.

## 2012-11-21 DIAGNOSIS — Z1212 Encounter for screening for malignant neoplasm of rectum: Secondary | ICD-10-CM | POA: Diagnosis not present

## 2012-11-25 DIAGNOSIS — K219 Gastro-esophageal reflux disease without esophagitis: Secondary | ICD-10-CM | POA: Diagnosis not present

## 2012-11-25 DIAGNOSIS — Z0000 Encounter for general adult medical examination without abnormal findings: Secondary | ICD-10-CM | POA: Diagnosis not present

## 2012-11-25 DIAGNOSIS — R7301 Impaired fasting glucose: Secondary | ICD-10-CM | POA: Diagnosis not present

## 2012-11-25 DIAGNOSIS — Z1331 Encounter for screening for depression: Secondary | ICD-10-CM | POA: Diagnosis not present

## 2013-03-10 DIAGNOSIS — M1710 Unilateral primary osteoarthritis, unspecified knee: Secondary | ICD-10-CM | POA: Diagnosis not present

## 2013-03-19 ENCOUNTER — Other Ambulatory Visit: Payer: Self-pay | Admitting: *Deleted

## 2013-03-19 ENCOUNTER — Other Ambulatory Visit: Payer: Self-pay | Admitting: Nurse Practitioner

## 2013-03-19 MED ORDER — CLOPIDOGREL BISULFATE 75 MG PO TABS: 75.0000 mg | ORAL_TABLET | Freq: Every day | ORAL | Status: DC

## 2013-04-29 DIAGNOSIS — C44529 Squamous cell carcinoma of skin of other part of trunk: Secondary | ICD-10-CM | POA: Diagnosis not present

## 2013-04-29 DIAGNOSIS — L570 Actinic keratosis: Secondary | ICD-10-CM | POA: Diagnosis not present

## 2013-04-29 DIAGNOSIS — D485 Neoplasm of uncertain behavior of skin: Secondary | ICD-10-CM | POA: Diagnosis not present

## 2013-04-29 DIAGNOSIS — L821 Other seborrheic keratosis: Secondary | ICD-10-CM | POA: Diagnosis not present

## 2013-05-13 DIAGNOSIS — C44529 Squamous cell carcinoma of skin of other part of trunk: Secondary | ICD-10-CM | POA: Diagnosis not present

## 2013-05-13 DIAGNOSIS — L570 Actinic keratosis: Secondary | ICD-10-CM | POA: Diagnosis not present

## 2013-05-26 DIAGNOSIS — K219 Gastro-esophageal reflux disease without esophagitis: Secondary | ICD-10-CM | POA: Diagnosis not present

## 2013-05-26 DIAGNOSIS — E669 Obesity, unspecified: Secondary | ICD-10-CM | POA: Diagnosis not present

## 2013-05-26 DIAGNOSIS — E785 Hyperlipidemia, unspecified: Secondary | ICD-10-CM | POA: Diagnosis not present

## 2013-05-26 DIAGNOSIS — M810 Age-related osteoporosis without current pathological fracture: Secondary | ICD-10-CM | POA: Diagnosis not present

## 2013-05-26 DIAGNOSIS — I2510 Atherosclerotic heart disease of native coronary artery without angina pectoris: Secondary | ICD-10-CM | POA: Diagnosis not present

## 2013-05-26 DIAGNOSIS — I10 Essential (primary) hypertension: Secondary | ICD-10-CM | POA: Diagnosis not present

## 2013-05-26 DIAGNOSIS — H3530 Unspecified macular degeneration: Secondary | ICD-10-CM | POA: Diagnosis not present

## 2013-05-26 DIAGNOSIS — F411 Generalized anxiety disorder: Secondary | ICD-10-CM | POA: Diagnosis not present

## 2013-06-04 DIAGNOSIS — M810 Age-related osteoporosis without current pathological fracture: Secondary | ICD-10-CM | POA: Diagnosis not present

## 2013-06-04 DIAGNOSIS — E559 Vitamin D deficiency, unspecified: Secondary | ICD-10-CM | POA: Diagnosis not present

## 2013-06-09 ENCOUNTER — Ambulatory Visit (INDEPENDENT_AMBULATORY_CARE_PROVIDER_SITE_OTHER): Payer: Medicare Other | Admitting: Nurse Practitioner

## 2013-06-09 ENCOUNTER — Encounter: Payer: Self-pay | Admitting: Nurse Practitioner

## 2013-06-09 VITALS — BP 136/85 | HR 72 | Ht 65.0 in | Wt 200.0 lb

## 2013-06-09 DIAGNOSIS — I2510 Atherosclerotic heart disease of native coronary artery without angina pectoris: Secondary | ICD-10-CM | POA: Diagnosis not present

## 2013-06-09 MED ORDER — NITROGLYCERIN 0.4 MG SL SUBL: 0.4000 mg | SUBLINGUAL_TABLET | SUBLINGUAL | Status: DC | PRN

## 2013-06-09 NOTE — Progress Notes (Addendum)
Tanya Ho Date of Birth: 1926-03-24 Medical Record #[id number]  History of Present Illness: Tanya Ho is seen back today for her one year check. Seen for Dr. Elease Hashimoto. Former patient of Dr. Ronnald Nian. Now 79 years of age. Has known mild/moderate CAD (maintained on chronic Plavix), HTN, HLD, palpitations, obesity, OA, hiatal hernia, GERD. She has her labs checked by Dr. Timothy Lasso.   Last seen in June of 2013 and was doing ok.   Comes in today. She is here alone. Doing well. Remains very active despite her age. Able to still keep her house, do her own shopping, cooking, cleaning, etc. Does not exercise due to knee pain but does remain active. Limited by back and knee pain. No falls. Tolerating her medicines. Does have lots of GI issues with chronic indigestion - mostly due to what she has eaten. Has used NTG on 2 occasions over the past 6 months - the last one was last night. She had eaten sausage and gravy however, then had some burping but the NTG did help. She is not interested in having "lots of tests" done. Overall, she feels like she is doing well and "does all she feels like doing".    Current Outpatient Prescriptions  Medication Sig Dispense Refill  . ALPRAZolam (XANAX) 0.5 MG tablet Take 1 mg by mouth at bedtime.       Marland Kitchen aspirin 81 MG tablet Take 81 mg by mouth daily.        . clopidogrel (PLAVIX) 75 MG tablet Take 1 tablet (75 mg total) by mouth daily.  30 tablet  1  . ergocalciferol (VITAMIN D2) 50000 UNITS capsule Take 50,000 Units by mouth once a week.        . furosemide (LASIX) 20 MG tablet Take 20 mg by mouth 2 (two) times daily.        . Ibuprofen (ADVIL PO) Take 400 mg by mouth as needed.       . metoprolol (LOPRESSOR) 100 MG tablet Take 1 tablet (100 mg total) by mouth 2 (two) times daily.  180 tablet  3  . pantoprazole (PROTONIX) 20 MG tablet Take 20 mg by mouth daily.      . simvastatin (ZOCOR) 20 MG tablet Take 20 mg by mouth every evening.      . triamterene-hydrochlorothiazide  (DYAZIDE) 37.5-25 MG per capsule Take 1 capsule by mouth every morning.         No current facility-administered medications for this visit.    Allergies  Allergen Reactions  . Codeine   . Demerol   . Morphine And Related     Past Medical History  Diagnosis Date  . SOB (shortness of breath)   . Coronary artery disease     NONOBSTRUCTIVE  . Palpitations   . Fatigue   . Back pain   . Knee pain   . Hyperlipidemia   . Bruises easily   . OA (osteoarthritis)   . Obesity   . Hypertension     Past Surgical History  Procedure Laterality Date  . Cardiac catheterization  05/11/2006    OVERALL CARDIAC SIZE AND SILHOUETTE ARE NORMAL. EF ESTIMATED 60%  . Kidney stones  1982  . Breast biopsy      LEFT BREAST  . Vaginal hysterectomy    . Knee arthroscopy  09/22/2008    History  Smoking status  . Never Smoker   Smokeless tobacco  . Never Used    History  Alcohol Use No    Family  History  Problem Relation Age of Onset  . Heart disease Mother   . Stroke Mother   . Heart attack Father     Review of Systems: The review of systems is per the HPI. All other systems were reviewed and are negative.  Physical Exam: BP 136/85  Pulse 72  Ht 5\' 5"  (1.651 m)  Wt 200 lb (90.719 kg)  BMI 33.28 kg/m2 Patient is very pleasant and in no acute distress. Skin is warm and dry. Color is normal.  HEENT is unremarkable. Normocephalic/atraumatic. PERRL. Sclera are nonicteric. Neck is supple. No masses. No JVD. Lungs are clear. Cardiac exam shows a regular rate and rhythm. Abdomen is soft. Extremities are without edema. Gait and ROM are intact. No gross neurologic deficits noted.  LABORATORY DATA:   Assessment / Plan: 1. CAD - managed medically. Has had some "indigestion" - back on her PPI therapy. This could be GI or heart related. Uses NTG on rare occasion of her husband's. I have sent a RX of her own in. She is to let me know if she has more spells. I will see her back in 6 months.    2. HTN - BP looks ok.   3. Palpitations - currently quiescent.   4. Advancing age - she looks good and overall is felt to be doing well. I will see her back in 6 months.   Patient is agreeable to this plan and will call if any problems develop in the interim.   Rosalio Macadamia, RN, ANP-C  [facility] [address] [place], [place]  [address]   EKG today is normal.

## 2013-06-09 NOTE — Patient Instructions (Addendum)
We will check an EKG today  For now, stay on your current medicines - I have sent in a prescription for NTG today  Use your NTG under your tongue for recurrent chest pain. May take one tablet every 5 minutes. If you are still having discomfort after 3 tablets in 15 minutes, call 911.  Let me know if you have more spells of chest discomfort and have to use more NTG  I will see you in 6 months.  Call the [facility] office at [phone] if you have any questions, problems or concerns.

## 2013-07-15 DIAGNOSIS — C44529 Squamous cell carcinoma of skin of other part of trunk: Secondary | ICD-10-CM | POA: Diagnosis not present

## 2013-07-15 DIAGNOSIS — L910 Hypertrophic scar: Secondary | ICD-10-CM | POA: Diagnosis not present

## 2013-08-19 DIAGNOSIS — L910 Hypertrophic scar: Secondary | ICD-10-CM | POA: Diagnosis not present

## 2013-09-22 DIAGNOSIS — Z23 Encounter for immunization: Secondary | ICD-10-CM | POA: Diagnosis not present

## 2013-10-02 ENCOUNTER — Other Ambulatory Visit: Payer: Self-pay

## 2013-10-02 DIAGNOSIS — Z1231 Encounter for screening mammogram for malignant neoplasm of breast: Secondary | ICD-10-CM

## 2013-10-23 ENCOUNTER — Ambulatory Visit
Admission: RE | Admit: 2013-10-23 | Discharge: 2013-10-23 | Disposition: A | Discharge status: Home / Self Care | Payer: Medicare Other | Source: Ambulatory Visit

## 2013-10-23 DIAGNOSIS — Z1231 Encounter for screening mammogram for malignant neoplasm of breast: Secondary | ICD-10-CM | POA: Diagnosis not present

## 2013-11-19 ENCOUNTER — Encounter: Payer: Self-pay | Admitting: Cardiology

## 2013-11-24 DIAGNOSIS — R7309 Other abnormal glucose: Secondary | ICD-10-CM | POA: Diagnosis not present

## 2013-11-24 DIAGNOSIS — I10 Essential (primary) hypertension: Secondary | ICD-10-CM | POA: Diagnosis not present

## 2013-11-24 DIAGNOSIS — E785 Hyperlipidemia, unspecified: Secondary | ICD-10-CM | POA: Diagnosis not present

## 2013-11-24 DIAGNOSIS — R82998 Other abnormal findings in urine: Secondary | ICD-10-CM | POA: Diagnosis not present

## 2013-11-24 DIAGNOSIS — E559 Vitamin D deficiency, unspecified: Secondary | ICD-10-CM | POA: Diagnosis not present

## 2013-11-24 DIAGNOSIS — R809 Proteinuria, unspecified: Secondary | ICD-10-CM | POA: Diagnosis not present

## 2013-11-26 ENCOUNTER — Encounter: Payer: Self-pay | Admitting: Nurse Practitioner

## 2013-11-26 ENCOUNTER — Encounter (INDEPENDENT_AMBULATORY_CARE_PROVIDER_SITE_OTHER): Payer: Self-pay

## 2013-11-26 ENCOUNTER — Ambulatory Visit (INDEPENDENT_AMBULATORY_CARE_PROVIDER_SITE_OTHER): Payer: Medicare Other | Admitting: Nurse Practitioner

## 2013-11-26 VITALS — BP 140/80 | HR 71 | Ht 66.0 in | Wt 195.1 lb

## 2013-11-26 DIAGNOSIS — I2510 Atherosclerotic heart disease of native coronary artery without angina pectoris: Secondary | ICD-10-CM

## 2013-11-26 NOTE — Patient Instructions (Signed)
Stay on your current medicines  I will see you in 6 months  Call the [facility] office at [phone] if you have any questions, problems or concerns.

## 2013-11-26 NOTE — Progress Notes (Signed)
Tanya Ho Date of Birth: 15-May-1926 Medical Record #[id number]  History of Present Illness: Tanya Ho is seen back today for a 6 month check. Seen for Dr. Elease Hashimoto. Former patient of Dr. Ronnald Nian. Now 79 years of age. Has known mild/moderate CAD (maintained on chronic Plavix), HTN, HLD, palpitations, obesity, OA, hiatal hernia, GERD. She has her labs checked by Dr. Timothy Lasso.   Last seen in June of 2014 and was doing ok for the most part.   Comes back today. Here with her husband. Doing ok. Has had 2 falls since she was last here. One she was actually going down the ladder from her hidden stairs. Pretty bruised up but no significant injury. No chest pain. Limited by her arthritis. Still doing her own housework, shopping, etc. Does fatigue easily. Feels like she is doing ok for her age. Sees Dr. Timothy Lasso next week and just had all of her labs done.  Current Outpatient Prescriptions  Medication Sig Dispense Refill  . ALPRAZolam (XANAX) 0.5 MG tablet Take 1 mg by mouth at bedtime.       Marland Kitchen aspirin 81 MG tablet Take 81 mg by mouth daily.        . clopidogrel (PLAVIX) 75 MG tablet Take 1 tablet (75 mg total) by mouth daily.  30 tablet  1  . ergocalciferol (VITAMIN D2) 50000 UNITS capsule Take 50,000 Units by mouth once a week.        . furosemide (LASIX) 20 MG tablet Take 20 mg by mouth 2 (two) times daily.        . Ibuprofen (ADVIL PO) Take 400 mg by mouth as needed.       . metoprolol (LOPRESSOR) 100 MG tablet Take 1 tablet (100 mg total) by mouth 2 (two) times daily.  180 tablet  3  . nitroGLYCERIN (NITROSTAT) 0.4 MG SL tablet Place 1 tablet (0.4 mg total) under the tongue every 5 (five) minutes as needed for chest pain.  25 tablet  3  . pantoprazole (PROTONIX) 20 MG tablet Take 20 mg by mouth daily.      . simvastatin (ZOCOR) 20 MG tablet Take 20 mg by mouth every evening.      . triamterene-hydrochlorothiazide (DYAZIDE) 37.5-25 MG per capsule Take 1 capsule by mouth every morning.         No current  facility-administered medications for this visit.    Allergies  Allergen Reactions  . Codeine   . Demerol   . Morphine And Related     Past Medical History  Diagnosis Date  . SOB (shortness of breath)   . Coronary artery disease     NONOBSTRUCTIVE  . Palpitations   . Fatigue   . Back pain   . Knee pain   . Hyperlipidemia   . Bruises easily   . OA (osteoarthritis)   . Obesity   . Hypertension     Past Surgical History  Procedure Laterality Date  . Cardiac catheterization  05/11/2006    OVERALL CARDIAC SIZE AND SILHOUETTE ARE NORMAL. EF ESTIMATED 60%  . Kidney stones  1982  . Breast biopsy      LEFT BREAST  . Vaginal hysterectomy    . Knee arthroscopy  09/22/2008    History  Smoking status  . Never Smoker   Smokeless tobacco  . Never Used    History  Alcohol Use No    Family History  Problem Relation Age of Onset  . Heart disease Mother   . Stroke Mother   .  Heart attack Father     Review of Systems: The review of systems is per the HPI.  All other systems were reviewed and are negative.  Physical Exam: BP 140/80  Pulse 71  Ht 5\' 6"  (1.676 m)  Wt 195 lb 1.9 oz (88.506 kg)  BMI 31.51 kg/m2  SpO2 95% Patient is very pleasant and in no acute distress. Skin is warm and dry. Color is normal.  HEENT is unremarkable. Normocephalic/atraumatic. PERRL. Sclera are nonicteric. Neck is supple. No masses. No JVD. Lungs are clear. Cardiac exam shows a regular rate and rhythm. Abdomen is soft. Extremities are without edema. Gait and ROM are intact. No gross neurologic deficits noted.  Wt Readings from Last 3 Encounters:  11/26/13 195 lb 1.9 oz (88.506 kg)  06/09/13 200 lb (90.719 kg)  06/06/12 198 lb 12.8 oz (90.175 kg)     LABORATORY DATA: Checked by PCP Lab Results  Component Value Date   HGB 13.3 POINT OF CARE RESULT 09/22/2008   GLUCOSE 134* 09/18/2008   NA 138 09/18/2008   K 4.2 09/18/2008   CL 104 09/18/2008   CREATININE 0.90 09/18/2008   BUN 11  09/18/2008   CO2 28 09/18/2008     Assessment / Plan: 1. CAD - no active chest pain. Managed medically.  2. HTN - BP is ok. No change in therapy  3. Palpitations - currently not an issue  4. Advancing age - falls - discussed in depth today.   5. HLD - labs checked by Dr. Timothy Lasso.  See her back in 6 months. No change with her medicines.   Patient is agreeable to this plan and will call if any problems develop in the interim.   Rosalio Macadamia, RN, ANP-C [facility] [address] [place], [place]  [address]

## 2013-12-01 DIAGNOSIS — I6529 Occlusion and stenosis of unspecified carotid artery: Secondary | ICD-10-CM | POA: Diagnosis not present

## 2013-12-01 DIAGNOSIS — R7309 Other abnormal glucose: Secondary | ICD-10-CM | POA: Diagnosis not present

## 2013-12-01 DIAGNOSIS — E669 Obesity, unspecified: Secondary | ICD-10-CM | POA: Diagnosis not present

## 2013-12-01 DIAGNOSIS — M1990 Unspecified osteoarthritis, unspecified site: Secondary | ICD-10-CM | POA: Diagnosis not present

## 2013-12-01 DIAGNOSIS — Z125 Encounter for screening for malignant neoplasm of prostate: Secondary | ICD-10-CM | POA: Diagnosis not present

## 2013-12-01 DIAGNOSIS — M810 Age-related osteoporosis without current pathological fracture: Secondary | ICD-10-CM | POA: Diagnosis not present

## 2013-12-01 DIAGNOSIS — Z0000 Encounter for general adult medical examination without abnormal findings: Secondary | ICD-10-CM | POA: Diagnosis not present

## 2013-12-01 DIAGNOSIS — Z23 Encounter for immunization: Secondary | ICD-10-CM | POA: Diagnosis not present

## 2013-12-01 DIAGNOSIS — E739 Lactose intolerance, unspecified: Secondary | ICD-10-CM | POA: Diagnosis not present

## 2013-12-01 DIAGNOSIS — I2510 Atherosclerotic heart disease of native coronary artery without angina pectoris: Secondary | ICD-10-CM | POA: Diagnosis not present

## 2013-12-01 DIAGNOSIS — Z1212 Encounter for screening for malignant neoplasm of rectum: Secondary | ICD-10-CM | POA: Diagnosis not present

## 2013-12-16 ENCOUNTER — Other Ambulatory Visit: Payer: Self-pay | Admitting: Cardiovascular Disease

## 2014-01-19 ENCOUNTER — Other Ambulatory Visit: Payer: Self-pay | Admitting: Cardiovascular Disease

## 2014-04-16 DIAGNOSIS — L570 Actinic keratosis: Secondary | ICD-10-CM | POA: Diagnosis not present

## 2014-04-16 DIAGNOSIS — D485 Neoplasm of uncertain behavior of skin: Secondary | ICD-10-CM | POA: Diagnosis not present

## 2014-04-17 DIAGNOSIS — B079 Viral wart, unspecified: Secondary | ICD-10-CM | POA: Diagnosis not present

## 2014-05-27 ENCOUNTER — Encounter: Payer: Self-pay | Admitting: Nurse Practitioner

## 2014-05-27 ENCOUNTER — Ambulatory Visit (INDEPENDENT_AMBULATORY_CARE_PROVIDER_SITE_OTHER): Payer: Medicare Other | Admitting: Nurse Practitioner

## 2014-05-27 VITALS — BP 150/90 | HR 67 | Ht 66.0 in | Wt 194.4 lb

## 2014-05-27 DIAGNOSIS — R002 Palpitations: Secondary | ICD-10-CM | POA: Diagnosis not present

## 2014-05-27 DIAGNOSIS — I2510 Atherosclerotic heart disease of native coronary artery without angina pectoris: Secondary | ICD-10-CM | POA: Diagnosis not present

## 2014-05-27 NOTE — Progress Notes (Signed)
Tanya Ho Date of Birth: 03-13-1926 Medical Record #[id number]  History of Present Illness: Tanya Ho is seen back today for a 6 month check. Seen for Dr. Acie Fredrickson. Former patient of Dr. Susa Simmonds. Now 80 years of age. Has known mild/moderate CAD (maintained on chronic Plavix), HTN, HLD, palpitations, obesity, OA, hiatal hernia, GERD. She has her labs checked by Dr. Virgina Jock.   Last seen in December of 2014 and was doing ok for the most part. Had had 2 falls.   Comes back today. Here with her husband. Doing ok. No chest pain. Limited by her arthritis/spasms in her back - worse with activity and relieved with rest and Advil.  Still doing her own housework, shopping, etc. Does fatigue easily. Feels like she is doing ok for her age. Sees Dr. Virgina Jock next week. BP ok and lower at home.  Very rare chest pain and very rarely uses NTG. Her biggest issue is her back.  Current Outpatient Prescriptions  Medication Sig Dispense Refill  . ALPRAZolam (XANAX) 0.5 MG tablet Take 1 mg by mouth at bedtime.       Marland Kitchen aspirin 81 MG tablet Take 81 mg by mouth daily.        . clopidogrel (PLAVIX) 75 MG tablet TAKE 1 TABLET BY MOUTH ONCE A DAY  30 tablet  4  . ergocalciferol (VITAMIN D2) 50000 UNITS capsule Take 50,000 Units by mouth once a week.        . furosemide (LASIX) 20 MG tablet Take 20 mg by mouth 2 (two) times daily.        . Ibuprofen (ADVIL PO) Take 400 mg by mouth as needed.       . metoprolol (LOPRESSOR) 100 MG tablet Take 1 tablet (100 mg total) by mouth 2 (two) times daily.  180 tablet  3  . nitroGLYCERIN (NITROSTAT) 0.4 MG SL tablet Place 1 tablet (0.4 mg total) under the tongue every 5 (five) minutes as needed for chest pain.  25 tablet  3  . pantoprazole (PROTONIX) 20 MG tablet Take 20 mg by mouth daily.      . simvastatin (ZOCOR) 20 MG tablet Take 20 mg by mouth every evening.      . triamterene-hydrochlorothiazide (DYAZIDE) 37.5-25 MG per capsule Take 1 capsule by mouth every morning.         No  current facility-administered medications for this visit.    Allergies  Allergen Reactions  . Codeine   . Demerol   . Morphine And Related     Past Medical History  Diagnosis Date  . SOB (shortness of breath)   . Coronary artery disease     NONOBSTRUCTIVE  . Palpitations   . Fatigue   . Back pain   . Knee pain   . Hyperlipidemia   . Bruises easily   . OA (osteoarthritis)   . Obesity   . Hypertension     Past Surgical History  Procedure Laterality Date  . Cardiac catheterization  05/11/2006    OVERALL CARDIAC SIZE AND SILHOUETTE ARE NORMAL. EF ESTIMATED 60%  . Kidney stones  1982  . Breast biopsy      LEFT BREAST  . Vaginal hysterectomy    . Knee arthroscopy  09/22/2008    History  Smoking status  . Never Smoker   Smokeless tobacco  . Never Used    History  Alcohol Use No    Family History  Problem Relation Age of Onset  . Heart disease Mother   .  Stroke Mother   . Heart attack Father     Review of Systems: The review of systems is per the HPI.  All other systems were reviewed and are negative.  Physical Exam: BP 150/90  Pulse 67  Ht 5\' 6"  (1.676 m)  Wt 194 lb 6.4 oz (88.179 kg)  BMI 31.39 kg/m2  SpO2 95% Patient is very pleasant and in no acute distress. Weight down 6 pounds over the past year.Skin is warm and dry. Color is normal.  HEENT is unremarkable. Normocephalic/atraumatic. PERRL. Sclera are nonicteric. Neck is supple. No masses. No JVD. Lungs are clear. Cardiac exam shows a regular rate and rhythm. Abdomen is soft. Extremities are without edema. Gait and ROM are intact. No gross neurologic deficits noted.  Wt Readings from Last 3 Encounters:  05/27/14 194 lb 6.4 oz (88.179 kg)  11/26/13 195 lb 1.9 oz (88.506 kg)  06/09/13 200 lb (90.719 kg)     LABORATORY DATA:  Lab Results  Component Value Date   HGB 13.3 POINT OF CARE RESULT 09/22/2008   GLUCOSE 134* 09/18/2008   NA 138 09/18/2008   K 4.2 09/18/2008   CL 104 09/18/2008   CREATININE  0.90 09/18/2008   BUN 11 09/18/2008   CO2 28 09/18/2008    Assessment / Plan:  1. CAD - no active chest pain. Managed medically. EKG today ok.   2. HTN - BP is ok. No change in therapy   3. Palpitations - currently not an issue   4. Advancing age   58. Back pain - this seems to be her most pressing issue - she will discuss with Dr. Virgina Jock next week.   6. HLD - labs checked by Dr. Virgina Jock.  See her back in 6 months. No change with her medicines.   Patient is agreeable to this plan and will call if any problems develop in the interim.   Burtis Junes, RN, [place]  [address] [place]  [place], [place] [address]

## 2014-05-27 NOTE — Patient Instructions (Signed)
Stay on your current medicines  See me in 6 months  Stay safe  Call the [place] office at [phone] if you have any questions, problems or concerns.

## 2014-05-28 DIAGNOSIS — Z6831 Body mass index (BMI) 31.0-31.9, adult: Secondary | ICD-10-CM | POA: Diagnosis not present

## 2014-05-28 DIAGNOSIS — I10 Essential (primary) hypertension: Secondary | ICD-10-CM | POA: Diagnosis not present

## 2014-05-28 DIAGNOSIS — K219 Gastro-esophageal reflux disease without esophagitis: Secondary | ICD-10-CM | POA: Diagnosis not present

## 2014-06-02 DIAGNOSIS — Z1331 Encounter for screening for depression: Secondary | ICD-10-CM | POA: Diagnosis not present

## 2014-06-02 DIAGNOSIS — R059 Cough, unspecified: Secondary | ICD-10-CM | POA: Diagnosis not present

## 2014-06-02 DIAGNOSIS — I2510 Atherosclerotic heart disease of native coronary artery without angina pectoris: Secondary | ICD-10-CM | POA: Diagnosis not present

## 2014-06-02 DIAGNOSIS — R05 Cough: Secondary | ICD-10-CM | POA: Diagnosis not present

## 2014-06-02 DIAGNOSIS — H3530 Unspecified macular degeneration: Secondary | ICD-10-CM | POA: Diagnosis not present

## 2014-06-02 DIAGNOSIS — E785 Hyperlipidemia, unspecified: Secondary | ICD-10-CM | POA: Diagnosis not present

## 2014-06-02 DIAGNOSIS — I10 Essential (primary) hypertension: Secondary | ICD-10-CM | POA: Diagnosis not present

## 2014-06-02 DIAGNOSIS — G4700 Insomnia, unspecified: Secondary | ICD-10-CM | POA: Diagnosis not present

## 2014-06-02 DIAGNOSIS — K219 Gastro-esophageal reflux disease without esophagitis: Secondary | ICD-10-CM | POA: Diagnosis not present

## 2014-06-02 DIAGNOSIS — R7309 Other abnormal glucose: Secondary | ICD-10-CM | POA: Diagnosis not present

## 2014-06-02 DIAGNOSIS — R0602 Shortness of breath: Secondary | ICD-10-CM | POA: Diagnosis not present

## 2014-06-11 DIAGNOSIS — H353190 Nonexudative age-related macular degeneration, unspecified eye, stage unspecified: Secondary | ICD-10-CM | POA: Diagnosis not present

## 2014-07-07 DIAGNOSIS — Z85828 Personal history of other malignant neoplasm of skin: Secondary | ICD-10-CM | POA: Diagnosis not present

## 2014-07-07 DIAGNOSIS — L570 Actinic keratosis: Secondary | ICD-10-CM | POA: Diagnosis not present

## 2014-07-07 DIAGNOSIS — L820 Inflamed seborrheic keratosis: Secondary | ICD-10-CM | POA: Diagnosis not present

## 2014-08-20 ENCOUNTER — Other Ambulatory Visit: Payer: Self-pay | Admitting: Cardiovascular Disease

## 2014-08-27 ENCOUNTER — Ambulatory Visit: Payer: Self-pay | Admitting: Ophthalmology

## 2014-08-27 DIAGNOSIS — Z01812 Encounter for preprocedural laboratory examination: Secondary | ICD-10-CM | POA: Diagnosis not present

## 2014-08-27 DIAGNOSIS — H2510 Age-related nuclear cataract, unspecified eye: Secondary | ICD-10-CM | POA: Diagnosis not present

## 2014-08-27 LAB — POTASSIUM: POTASSIUM: 4 mmol/L (ref 3.5–5.1)

## 2014-09-08 ENCOUNTER — Ambulatory Visit: Payer: Self-pay | Admitting: Ophthalmology

## 2014-09-08 DIAGNOSIS — Z85828 Personal history of other malignant neoplasm of skin: Secondary | ICD-10-CM | POA: Diagnosis not present

## 2014-09-08 DIAGNOSIS — H2510 Age-related nuclear cataract, unspecified eye: Secondary | ICD-10-CM | POA: Diagnosis not present

## 2014-09-08 DIAGNOSIS — Z885 Allergy status to narcotic agent status: Secondary | ICD-10-CM | POA: Diagnosis not present

## 2014-09-08 DIAGNOSIS — I10 Essential (primary) hypertension: Secondary | ICD-10-CM | POA: Diagnosis not present

## 2014-09-08 DIAGNOSIS — K219 Gastro-esophageal reflux disease without esophagitis: Secondary | ICD-10-CM | POA: Diagnosis not present

## 2014-09-08 DIAGNOSIS — H269 Unspecified cataract: Secondary | ICD-10-CM | POA: Diagnosis not present

## 2014-09-24 ENCOUNTER — Ambulatory Visit: Payer: Self-pay | Admitting: Ophthalmology

## 2014-09-24 DIAGNOSIS — H2513 Age-related nuclear cataract, bilateral: Secondary | ICD-10-CM | POA: Diagnosis not present

## 2014-09-24 DIAGNOSIS — Z01812 Encounter for preprocedural laboratory examination: Secondary | ICD-10-CM | POA: Diagnosis not present

## 2014-09-24 DIAGNOSIS — H2510 Age-related nuclear cataract, unspecified eye: Secondary | ICD-10-CM | POA: Diagnosis not present

## 2014-09-24 LAB — POTASSIUM: Potassium: 4.3 mmol/L (ref 3.5–5.1)

## 2014-09-29 ENCOUNTER — Ambulatory Visit: Payer: Self-pay | Admitting: Ophthalmology

## 2014-09-29 DIAGNOSIS — H2512 Age-related nuclear cataract, left eye: Secondary | ICD-10-CM | POA: Diagnosis not present

## 2014-09-29 DIAGNOSIS — I10 Essential (primary) hypertension: Secondary | ICD-10-CM | POA: Diagnosis not present

## 2014-09-29 DIAGNOSIS — Z85828 Personal history of other malignant neoplasm of skin: Secondary | ICD-10-CM | POA: Diagnosis not present

## 2014-09-29 DIAGNOSIS — Z885 Allergy status to narcotic agent status: Secondary | ICD-10-CM | POA: Diagnosis not present

## 2014-09-29 DIAGNOSIS — H269 Unspecified cataract: Secondary | ICD-10-CM | POA: Diagnosis not present

## 2014-10-05 DIAGNOSIS — Z23 Encounter for immunization: Secondary | ICD-10-CM | POA: Diagnosis not present

## 2014-11-24 ENCOUNTER — Ambulatory Visit (INDEPENDENT_AMBULATORY_CARE_PROVIDER_SITE_OTHER): Payer: Medicare Other | Admitting: Nurse Practitioner

## 2014-11-24 VITALS — BP 110/60 | HR 76 | Ht 66.5 in | Wt 195.0 lb

## 2014-11-24 DIAGNOSIS — I10 Essential (primary) hypertension: Secondary | ICD-10-CM

## 2014-11-24 DIAGNOSIS — R002 Palpitations: Secondary | ICD-10-CM

## 2014-11-24 DIAGNOSIS — I2510 Atherosclerotic heart disease of native coronary artery without angina pectoris: Secondary | ICD-10-CM | POA: Diagnosis not present

## 2014-11-24 NOTE — Progress Notes (Signed)
[name] Date of Birth: 1926-04-08 Medical Record #[id number]  History of Present Illness: Tanya Ho is seen back today for a 6 month check. Seen for Dr. Acie Fredrickson. Former patient of Dr. Susa Simmonds. Soon to be 80 years of age. Has known mild/moderate CAD (maintained on chronic Plavix), HTN, HLD, palpitations, obesity, OA, hiatal hernia, GERD. She has her labs checked by Dr. Virgina Jock.   Last seen in June of 2015 and was doing ok for the most part. Mostly limited by arthritis - especially in her back.   Comes back today. Here with her husband. Doing ok. She remains just as active as ever. Cooking, cleaning, shopping - all as she had before. Limited by her back. No falls. BP is good. Overall she is happy with how she is doing.  Current Outpatient Prescriptions  Medication Sig Dispense Refill  . ALPRAZolam (XANAX) 0.5 MG tablet Take 1 mg by mouth at bedtime.     . clopidogrel (PLAVIX) 75 MG tablet TAKE 1 TABLET BY MOUTH ONCE A DAY 30 tablet 6  . ergocalciferol (VITAMIN D2) 50000 UNITS capsule Take 50,000 Units by mouth once a week.      . furosemide (LASIX) 20 MG tablet Take 20 mg by mouth 2 (two) times daily.      . Ibuprofen (ADVIL PO) Take 400 mg by mouth as needed.     . metoprolol (LOPRESSOR) 100 MG tablet Take 1 tablet (100 mg total) by mouth 2 (two) times daily. 180 tablet 3  . nitroGLYCERIN (NITROSTAT) 0.4 MG SL tablet Place 1 tablet (0.4 mg total) under the tongue every 5 (five) minutes as needed for chest pain. 25 tablet 3  . pantoprazole (PROTONIX) 20 MG tablet Take 20 mg by mouth daily.    . ranitidine (ZANTAC) 150 MG tablet Take 150 mg by mouth at bedtime.    . simvastatin (ZOCOR) 20 MG tablet Take 20 mg by mouth every evening.    . triamterene-hydrochlorothiazide (DYAZIDE) 37.5-25 MG per capsule Take 1 capsule by mouth every morning.       No current facility-administered medications for this visit.    Allergies  Allergen Reactions  . Codeine   . Demerol   . Morphine And Related       Past Medical History  Diagnosis Date  . SOB (shortness of breath)   . Coronary artery disease     NONOBSTRUCTIVE  . Palpitations   . Fatigue   . Back pain   . Knee pain   . Hyperlipidemia   . Bruises easily   . OA (osteoarthritis)   . Obesity   . Hypertension     Past Surgical History  Procedure Laterality Date  . Cardiac catheterization  05/11/2006    OVERALL CARDIAC SIZE AND SILHOUETTE ARE NORMAL. EF ESTIMATED 60%  . Kidney stones  1982  . Breast biopsy      LEFT BREAST  . Vaginal hysterectomy    . Knee arthroscopy  09/22/2008    History  Smoking status  . Never Smoker   Smokeless tobacco  . Never Used    History  Alcohol Use No    Family History  Problem Relation Age of Onset  . Heart disease Mother   . Stroke Mother   . Heart attack Father     Review of Systems: The review of systems is per the HPI.  All other systems were reviewed and are negative.  Physical Exam: BP 110/60 mmHg  Pulse 76  Ht 5' 6.5" (1.689  m)  Wt 195 lb (88.451 kg)  BMI 31.01 kg/m2  SpO2 97%  BP is 130/60 by me. Patient is very pleasant and in no acute distress. She is obese. Weight is stable.  Skin is warm and dry. Color is normal.  HEENT is unremarkable. Normocephalic/atraumatic. PERRL. Sclera are nonicteric. Neck is supple. No masses. No JVD. Lungs are clear. Cardiac exam shows a regular rate and rhythm. Abdomen is soft. Extremities are without edema. Gait and ROM are intact. No gross neurologic deficits noted.  Wt Readings from Last 3 Encounters:  11/24/14 195 lb (88.451 kg)  05/27/14 194 lb 6.4 oz (88.179 kg)  11/26/13 195 lb 1.9 oz (88.506 kg)    LABORATORY DATA/PROCEDURES:  Lab Results  Component Value Date   HGB 13.3 POINT OF CARE RESULT 09/22/2008   GLUCOSE 134* 09/18/2008   NA 138 09/18/2008   K 4.2 09/18/2008   CL 104 09/18/2008   CREATININE 0.90 09/18/2008   BUN 11 09/18/2008   CO2 28 09/18/2008    BNP (last 3 results) No results for input(s): PROBNP  in the last 8760 hours.   Assessment / Plan:  1. CAD - no active chest pain. Managed medically. EKG today ok.   2. HTN - BP looks great. No change in therapy   3. Palpitations - currently not an issue   4. Advancing age   75. Back pain - still her most pressing issue.   6. HLD - labs checked by Dr. Virgina Jock.  See back in 6 months. Continue with her current treatment plan.   Patient is agreeable to this plan and will call if any problems develop in the interim.   Burtis Junes, RN, [place] [address] [place], [place]  [address] [phone]

## 2014-11-24 NOTE — Patient Instructions (Signed)
Stay on your current medicines  See me in 6 months  Call the [facility] office at [phone] if you have any questions, problems or concerns.   

## 2015-01-06 ENCOUNTER — Other Ambulatory Visit: Payer: Self-pay

## 2015-01-06 MED ORDER — NITROGLYCERIN 0.4 MG SL SUBL: 0.4000 mg | SUBLINGUAL_TABLET | SUBLINGUAL | Status: DC | PRN

## 2015-02-16 DIAGNOSIS — M1712 Unilateral primary osteoarthritis, left knee: Secondary | ICD-10-CM | POA: Diagnosis not present

## 2015-02-16 DIAGNOSIS — M1711 Unilateral primary osteoarthritis, right knee: Secondary | ICD-10-CM | POA: Diagnosis not present

## 2015-02-16 DIAGNOSIS — M25561 Pain in right knee: Secondary | ICD-10-CM | POA: Diagnosis not present

## 2015-02-16 DIAGNOSIS — M25562 Pain in left knee: Secondary | ICD-10-CM | POA: Diagnosis not present

## 2015-03-11 ENCOUNTER — Ambulatory Visit (HOSPITAL_COMMUNITY)
Admission: RE | Admit: 2015-03-11 | Discharge: 2015-03-11 | Disposition: A | Discharge status: Home / Self Care | Payer: Medicare Other | Source: Ambulatory Visit | Attending: Vascular Surgery | Admitting: Vascular Surgery

## 2015-03-11 ENCOUNTER — Other Ambulatory Visit (HOSPITAL_COMMUNITY): Payer: Self-pay | Admitting: Internal Medicine

## 2015-03-11 DIAGNOSIS — R600 Localized edema: Secondary | ICD-10-CM

## 2015-03-17 DIAGNOSIS — M25562 Pain in left knee: Secondary | ICD-10-CM | POA: Diagnosis not present

## 2015-03-17 DIAGNOSIS — M25561 Pain in right knee: Secondary | ICD-10-CM | POA: Diagnosis not present

## 2015-03-17 DIAGNOSIS — S86812A Strain of other muscle(s) and tendon(s) at lower leg level, left leg, initial encounter: Secondary | ICD-10-CM | POA: Diagnosis not present

## 2015-03-17 DIAGNOSIS — M1711 Unilateral primary osteoarthritis, right knee: Secondary | ICD-10-CM | POA: Diagnosis not present

## 2015-04-10 NOTE — Op Note (Signed)
PATIENT NAME:  Tanya Ho, Tanya Ho MR#:  [id number] DATE OF BIRTH:  [birth year]  DATE OF PROCEDURE:  09/29/2014  PREOPERATIVE DIAGNOSIS:  Visually significant cataract of the left eye.   POSTOPERATIVE DIAGNOSIS:  Visually significant cataract of the left eye.   OPERATIVE PROCEDURE:  Cataract extraction by phacoemulsification with implant of intraocular lens to left eye.   SURGEON:  Birder Robson, MD  ANESTHESIA:  1. Managed anesthesia care.  2. Topical tetracaine drops followed by 2% Xylocaine jelly applied in the preoperative holding area.   COMPLICATIONS:  None.   TECHNIQUE:  Stop and chop.   DESCRIPTION OF PROCEDURE: The patient was examined and consented in the preoperative holding area where the aforementioned topical anesthesia was applied to the left eye and then brought back to the Operating Room where the left eye was prepped and draped in the usual sterile ophthalmic fashion and a lid speculum was placed. A paracentesis was created with the side port blade and the anterior chamber was filled with viscoelastic. A near clear corneal incision was performed with the steel keratome. A continuous curvilinear capsulorrhexis was performed with a cystotome followed by the capsulorrhexis forceps. Hydrodissection and hydrodelineation were carried out with BSS on a blunt cannula. The lens was removed in a stop and chop technique and the remaining cortical material was removed with the irrigation-aspiration handpiece. The capsular bag was inflated with viscoelastic and the Tecnis ZCB00, 21.5-diopter lens, serial number [id number], was placed in the capsular bag without complication. The remaining viscoelastic was removed from the eye with the irrigation-aspiration handpiece. The wounds were hydrated. The anterior chamber was flushed with Miostat and the eye was inflated to physiologic pressure. 0.1 mL of cefuroxime concentration 10 mg/mL was placed in the anterior chamber. The wounds were found to be  water tight. The eye was dressed with Vigamox. The patient was given protective glasses to wear throughout the day and a shield with which to sleep tonight. The patient was also given drops with which to begin a drop regimen today and will follow-up with me in one day.    ____________________________ Livingston Diones. Ronte Parker, MD wlp:nb D: 09/29/2014 21:42:42 ET T: 09/30/2014 00:13:45 ET JOB#: [id number]  cc: Aymee Fomby L. Arvie Villarruel, MD, <Dictator> Livingston Diones Jareli Highland MD ELECTRONICALLY SIGNED 09/30/2014 10:19

## 2015-04-10 NOTE — Op Note (Signed)
PATIENT NAME:  Tanya Ho, Tanya Ho MR#:  [id number] DATE OF BIRTH:  1926/06/18  DATE OF PROCEDURE:  09/08/2014  PREOPERATIVE DIAGNOSIS: Visually significant cataract of the right eye.   POSTOPERATIVE DIAGNOSIS: Visually significant cataract of the right eye.   OPERATIVE PROCEDURE: Cataract extraction by phacoemulsification with implant of intraocular lens to right eye.   SURGEON: Birder Robson, MD.   ANESTHESIA:  1. Managed anesthesia care.  2. Topical tetracaine drops followed by 2% Xylocaine jelly applied in the preoperative holding area.   COMPLICATIONS: None.   TECHNIQUE:  Stop and chop.   DESCRIPTION OF PROCEDURE: The patient was examined and consented in the preoperative holding area where the aforementioned topical anesthesia was applied to the right eye and then brought back to the Operating Room where the right eye was prepped and draped in the usual sterile ophthalmic fashion and a lid speculum was placed. A paracentesis was created with the side port blade and the anterior chamber was filled with viscoelastic. A near clear corneal incision was performed with the steel keratome. A continuous curvilinear capsulorrhexis was performed with a cystotome followed by the capsulorrhexis forceps. Hydrodissection and hydrodelineation were carried out with BSS on a blunt cannula. The lens was removed in a stop and chop technique and the remaining cortical material was removed with the irrigation-aspiration handpiece. The capsular bag was inflated with viscoelastic and the Tecnis ZCB00 21.5-diopter lens, serial number [id number] was placed in the capsular bag without complication. The remaining viscoelastic was removed from the eye with the irrigation-aspiration handpiece. The wounds were hydrated. The anterior chamber was flushed with Miostat and the eye was inflated to physiologic pressure. 0.1 mL of cefuroxime concentration 10 mg/mL was placed in the anterior chamber. The wounds were found to be  water tight. The eye was dressed with Vigamox. The patient was given protective glasses to wear throughout the day and a shield with which to sleep tonight. The patient was also given drops with which to begin a drop regimen today and will follow-up with me in one day.    ____________________________ Livingston Diones. Kalum Minner, MD wlp:lt D: 09/08/2014 21:12:00 ET T: 09/08/2014 22:07:36 ET JOB#: [id number]  cc: Dawsyn Ramsaran L. Ryland Smoots, MD, <Dictator>    Livingston Diones Marykatherine Sherwood MD ELECTRONICALLY SIGNED 09/09/2014 10:43

## 2015-06-02 ENCOUNTER — Ambulatory Visit
Admission: RE | Admit: 2015-06-02 | Discharge: 2015-06-02 | Disposition: A | Discharge status: Home / Self Care | Payer: Medicare Other | Source: Ambulatory Visit | Attending: Nurse Practitioner | Admitting: Nurse Practitioner

## 2015-06-02 ENCOUNTER — Encounter: Payer: Self-pay | Admitting: Nurse Practitioner

## 2015-06-02 ENCOUNTER — Ambulatory Visit (INDEPENDENT_AMBULATORY_CARE_PROVIDER_SITE_OTHER): Payer: Medicare Other | Admitting: Nurse Practitioner

## 2015-06-02 VITALS — BP 140/70 | HR 66 | Ht 65.0 in | Wt 192.8 lb

## 2015-06-02 DIAGNOSIS — W19XXXA Unspecified fall, initial encounter: Secondary | ICD-10-CM

## 2015-06-02 DIAGNOSIS — S299XXA Unspecified injury of thorax, initial encounter: Secondary | ICD-10-CM | POA: Diagnosis not present

## 2015-06-02 DIAGNOSIS — R0781 Pleurodynia: Secondary | ICD-10-CM | POA: Diagnosis not present

## 2015-06-02 DIAGNOSIS — I10 Essential (primary) hypertension: Secondary | ICD-10-CM | POA: Diagnosis not present

## 2015-06-02 NOTE — Progress Notes (Signed)
CARDIOLOGY OFFICE NOTE  Date:  06/02/2015    [name] Date of Birth: Jun 10, 1926 Medical Record #[id number]  PCP:  Precious Reel, MD  Cardiologist:  Nahser    Chief Complaint  Patient presents with  . Hypertension    6 month check - seen for Dr. Acie Fredrickson    History of Present Illness: Tanya STONEROCK is a 81 y.o. female who presents today for a 6 month check. Seen for Dr. Acie Fredrickson. Former patient of Dr. Susa Simmonds.  Has known mild/moderate CAD (maintained on chronic Plavix), HTN, HLD, palpitations, obesity, OA, hiatal hernia, GERD. She has her labs checked by Dr. Virgina Jock.   Last seen in December of 2015 and was doing ok for the most part. Mostly limited by arthritis - especially in her back.  Her husband has just passed away earlier this month.    Comes back today. Here with her daughter today [name]. Her husband died a little over a week ago. She is just devastated. He was at [facility] - only there about 3 hours - sounds like had massive MI with multisystem organ failure. She fell actually in the ER - she did not seek medical attention due to the nature of the circumstances. She is now having more pleuritic chest pain over the left lumbar area. Using some aspirin and Tramadol. No active chest pain. Kids are sleeping with her at night. She has not been alone. Has not seen Dr. Virgina Jock in quite some time.    Past Medical History  Diagnosis Date  . SOB (shortness of breath)   . Coronary artery disease     NONOBSTRUCTIVE  . Palpitations   . Fatigue   . Back pain   . Knee pain   . Hyperlipidemia   . Bruises easily   . OA (osteoarthritis)   . Obesity   . Hypertension     Past Surgical History  Procedure Laterality Date  . Cardiac catheterization  05/11/2006    OVERALL CARDIAC SIZE AND SILHOUETTE ARE NORMAL. EF ESTIMATED 60%  . Kidney stones  1982  . Breast biopsy      LEFT BREAST  . Vaginal hysterectomy    . Knee arthroscopy  09/22/2008     Medications: Current Outpatient  Prescriptions  Medication Sig Dispense Refill  . ALPRAZolam (XANAX) 0.5 MG tablet Take 1 mg by mouth at bedtime.     . clopidogrel (PLAVIX) 75 MG tablet TAKE 1 TABLET BY MOUTH ONCE A DAY 30 tablet 6  . ergocalciferol (VITAMIN D2) 50000 UNITS capsule Take 50,000 Units by mouth once a week.      . furosemide (LASIX) 20 MG tablet Take 20 mg by mouth 2 (two) times daily.      . Ibuprofen (ADVIL PO) Take 400 mg by mouth as needed.     . metoprolol (LOPRESSOR) 100 MG tablet Take 1 tablet (100 mg total) by mouth 2 (two) times daily. 180 tablet 3  . nitroGLYCERIN (NITROSTAT) 0.4 MG SL tablet Place 1 tablet (0.4 mg total) under the tongue every 5 (five) minutes as needed for chest pain. 25 tablet 3  . pantoprazole (PROTONIX) 20 MG tablet Take 20 mg by mouth daily.    . ranitidine (ZANTAC) 150 MG tablet Take 150 mg by mouth at bedtime.    . simvastatin (ZOCOR) 20 MG tablet Take 20 mg by mouth every evening.    . traMADol (ULTRAM) 50 MG tablet TAKE 1-2 TABLETS BY MOUTH EVERY 6-8 HOURS AS NEEDED  FOR PAIN  0  . triamterene-hydrochlorothiazide (DYAZIDE) 37.5-25 MG per capsule Take 1 capsule by mouth every morning.       No current facility-administered medications for this visit.    Allergies: Allergies  Allergen Reactions  . Codeine   . Demerol   . Morphine And Related     Social History: The patient  reports that she has never smoked. She has never used smokeless tobacco. She reports that she does not drink alcohol or use illicit drugs.   Family History: The patient's family history includes Heart attack in her father; Heart disease in her mother; Stroke in her mother.   Review of Systems: Please see the history of present illness.   Otherwise, the review of systems is positive for none.   All other systems are reviewed and negative.   Physical Exam: VS:  BP 140/70 mmHg  Pulse 66  Ht 5\' 5"  (1.651 m)  Wt 192 lb 12.8 oz (87.454 kg)  BMI 32.08 kg/m2  SpO2 97% .  BMI Body mass index is 32.08  kg/(m^2).  Wt Readings from Last 3 Encounters:  06/02/15 192 lb 12.8 oz (87.454 kg)  11/24/14 195 lb (88.451 kg)  05/27/14 194 lb 6.4 oz (88.179 kg)    General: Pleasant. Well developed, well nourished and in no acute distress.  HEENT: Normal. Neck: Supple, no JVD, carotid bruits, or masses noted.  Cardiac: Regular rate and rhythm. No murmurs, rubs, or gallops. No edema.  Respiratory:  Lungs are clear to auscultation bilaterally with normal work of breathing.  GI: Soft and nontender.  MS: No deformity or atrophy. Gait and ROM intact. Skin: Warm and dry. Color is normal. She has tenderness over the left lumbar area.  Neuro:  Strength and sensation are intact and no gross focal deficits noted.  Psych: Alert, appropriate and with normal affect.   LABORATORY DATA:  EKG:  EKG is not ordered today.   Lab Results  Component Value Date   HGB 13.3 POINT OF CARE RESULT 09/22/2008   GLUCOSE 134* 09/18/2008   NA 138 09/18/2008   K 4.3 09/24/2014   CL 104 09/18/2008   CREATININE 0.90 09/18/2008   BUN 11 09/18/2008   CO2 28 09/18/2008    BNP (last 3 results) No results for input(s): BNP in the last 8760 hours.  ProBNP (last 3 results) No results for input(s): PROBNP in the last 8760 hours.   Other Studies Reviewed Today:   Assessment/Plan: 1. CAD - no active chest pain. Managed medically.   2. HTN - BP looks great. No change in therapy   3. Situational stress - discussed at length.  4. Fall - ?rib fracture - will send for Xrays today - she has Tramadol.   Current medicines are reviewed with the patient today.  The patient does not have concerns regarding medicines other than what has been noted above.  The following changes have been made:  See above.  Labs/ tests ordered today include:    Orders Placed This Encounter  Procedures  . DG Chest 2 View  . DG Ribs Bilateral     Disposition:   FU with me in 2 months.   Patient is agreeable to this plan and will  call if any problems develop in the interim.   Signed: Burtis Junes, RN, ANP-C 06/02/2015 12:45 PM  [facility] [address] [place], [place]  [address] Phone: [phone] Fax: [phone]

## 2015-06-02 NOTE — Patient Instructions (Signed)
We will be checking the following labs today - NONE   Medication Instructions:    Continue with your current medicines.     Testing/Procedures To Be Arranged:  Go to [facility] for Xrays - you may walk in  Follow-Up:   I will see you back at the end of August    Other Special Instructions:   N/A  Call the [place] office at [phone] if you have any questions, problems or concerns.

## 2015-06-08 DIAGNOSIS — M25562 Pain in left knee: Secondary | ICD-10-CM | POA: Diagnosis not present

## 2015-06-08 DIAGNOSIS — M1712 Unilateral primary osteoarthritis, left knee: Secondary | ICD-10-CM | POA: Diagnosis not present

## 2015-06-08 DIAGNOSIS — G8929 Other chronic pain: Secondary | ICD-10-CM | POA: Diagnosis not present

## 2015-06-08 DIAGNOSIS — M25561 Pain in right knee: Secondary | ICD-10-CM | POA: Diagnosis not present

## 2015-06-08 DIAGNOSIS — M170 Bilateral primary osteoarthritis of knee: Secondary | ICD-10-CM | POA: Diagnosis not present

## 2015-06-18 DIAGNOSIS — M1712 Unilateral primary osteoarthritis, left knee: Secondary | ICD-10-CM | POA: Diagnosis not present

## 2015-06-22 DIAGNOSIS — M1712 Unilateral primary osteoarthritis, left knee: Secondary | ICD-10-CM | POA: Diagnosis not present

## 2015-07-20 DIAGNOSIS — M25562 Pain in left knee: Secondary | ICD-10-CM | POA: Diagnosis not present

## 2015-07-20 DIAGNOSIS — M79605 Pain in left leg: Secondary | ICD-10-CM | POA: Diagnosis not present

## 2015-07-20 DIAGNOSIS — M1712 Unilateral primary osteoarthritis, left knee: Secondary | ICD-10-CM | POA: Diagnosis not present

## 2015-07-28 DIAGNOSIS — G8929 Other chronic pain: Secondary | ICD-10-CM | POA: Diagnosis not present

## 2015-07-28 DIAGNOSIS — M25562 Pain in left knee: Secondary | ICD-10-CM | POA: Diagnosis not present

## 2015-07-28 DIAGNOSIS — M79605 Pain in left leg: Secondary | ICD-10-CM | POA: Diagnosis not present

## 2015-08-06 DIAGNOSIS — M25562 Pain in left knee: Secondary | ICD-10-CM | POA: Diagnosis not present

## 2015-08-06 DIAGNOSIS — M1712 Unilateral primary osteoarthritis, left knee: Secondary | ICD-10-CM | POA: Diagnosis not present

## 2015-08-06 DIAGNOSIS — M79605 Pain in left leg: Secondary | ICD-10-CM | POA: Diagnosis not present

## 2015-08-06 DIAGNOSIS — S83272A Complex tear of lateral meniscus, current injury, left knee, initial encounter: Secondary | ICD-10-CM | POA: Diagnosis not present

## 2015-08-11 ENCOUNTER — Ambulatory Visit (INDEPENDENT_AMBULATORY_CARE_PROVIDER_SITE_OTHER): Payer: Medicare Other | Admitting: Nurse Practitioner

## 2015-08-11 ENCOUNTER — Encounter: Payer: Self-pay | Admitting: Nurse Practitioner

## 2015-08-11 VITALS — BP 160/80 | HR 64 | Ht 66.0 in | Wt 190.8 lb

## 2015-08-11 DIAGNOSIS — I2510 Atherosclerotic heart disease of native coronary artery without angina pectoris: Secondary | ICD-10-CM | POA: Diagnosis not present

## 2015-08-11 DIAGNOSIS — I10 Essential (primary) hypertension: Secondary | ICD-10-CM | POA: Diagnosis not present

## 2015-08-11 DIAGNOSIS — F4321 Adjustment disorder with depressed mood: Secondary | ICD-10-CM | POA: Diagnosis not present

## 2015-08-11 MED ORDER — NITROGLYCERIN 0.4 MG SL SUBL: 0.4000 mg | SUBLINGUAL_TABLET | SUBLINGUAL | Status: DC | PRN

## 2015-08-11 NOTE — Progress Notes (Signed)
CARDIOLOGY OFFICE NOTE  Date:  08/11/2015    Tanya Ho Date of Birth: January 15, 1926 Medical Record #[id number]  PCP:  Precious Reel, MD  Cardiologist:  Nahser    Chief Complaint  Patient presents with  . Coronary Artery Disease    Follow up visit - seen for Dr. Acie Fredrickson    History of Present Illness: Tanya Ho is a 81 y.o. female who presents today for a follow up visit. Seen for Dr. Acie Fredrickson. Former patient of Dr. Susa Simmonds. Has known mild/moderate CAD (maintained on chronic Plavix), HTN, HLD, palpitations, obesity, OA, hiatal hernia, GERD. She has her labs checked by Dr. Virgina Jock.   Last seen in December of 2015 and was doing ok for the most part. Mostly limited by arthritis - especially in her back.  I saw her back in June. Her husband had just passed away earlier that month. I saw her back shortly thereafter.   Comes back today. Here alone today. Kids are taking turns spending the night with her. She just does not like being alone at night - gets too scared. She will have some fleeting chest pain - does use NTG on rare occasion - needs refill. Breathing is ok. She is lonely. Her knees hurt. Has seen Dr. Delilah Shan recently and had MRI of her knee - has a tear noted. She is not interested in surgery. Has not seen Dr. Virgina Jock yet.   Past Medical History  Diagnosis Date  . SOB (shortness of breath)   . Coronary artery disease     NONOBSTRUCTIVE  . Palpitations   . Fatigue   . Back pain   . Knee pain   . Hyperlipidemia   . Bruises easily   . OA (osteoarthritis)   . Obesity   . Hypertension     Past Surgical History  Procedure Laterality Date  . Cardiac catheterization  05/11/2006    OVERALL CARDIAC SIZE AND SILHOUETTE ARE NORMAL. EF ESTIMATED 60%  . Kidney stones  1982  . Breast biopsy      LEFT BREAST  . Vaginal hysterectomy    . Knee arthroscopy  09/22/2008     Medications: Current Outpatient Prescriptions  Medication Sig Dispense Refill  . ALPRAZolam (XANAX) 0.5  MG tablet Take 1 mg by mouth at bedtime.     . clopidogrel (PLAVIX) 75 MG tablet TAKE 1 TABLET BY MOUTH ONCE A DAY 30 tablet 6  . ergocalciferol (VITAMIN D2) 50000 UNITS capsule Take 50,000 Units by mouth once a week.      . furosemide (LASIX) 20 MG tablet Take 20 mg by mouth 2 (two) times daily.      . Ibuprofen (ADVIL PO) Take 400 mg by mouth as needed.     . metoprolol (LOPRESSOR) 100 MG tablet Take 1 tablet (100 mg total) by mouth 2 (two) times daily. 180 tablet 3  . nitroGLYCERIN (NITROSTAT) 0.4 MG SL tablet Place 1 tablet (0.4 mg total) under the tongue every 5 (five) minutes as needed for chest pain. 25 tablet 3  . pantoprazole (PROTONIX) 20 MG tablet Take 20 mg by mouth daily.    . ranitidine (ZANTAC) 150 MG tablet Take 150 mg by mouth at bedtime.    . simvastatin (ZOCOR) 20 MG tablet Take 20 mg by mouth every evening.    . traMADol (ULTRAM) 50 MG tablet TAKE 1-2 TABLETS BY MOUTH EVERY 6-8 HOURS AS NEEDED FOR PAIN  0  . triamterene-hydrochlorothiazide (DYAZIDE) 37.5-25 MG per capsule Take  1 capsule by mouth every morning.       No current facility-administered medications for this visit.    Allergies: Allergies  Allergen Reactions  . Codeine   . Demerol   . Morphine And Related     Social History: The patient  reports that she has never smoked. She has never used smokeless tobacco. She reports that she does not drink alcohol or use illicit drugs.   Family History: The patient's family history includes Heart attack in her father; Heart disease in her mother; Stroke in her mother.   Review of Systems: Please see the history of present illness.   Otherwise, the review of systems is positive for none.   All other systems are reviewed and negative.   Physical Exam: VS:  BP 160/80 mmHg  Pulse 64  Ht 5\' 6"  (1.676 m)  Wt 190 lb 12.8 oz (86.546 kg)  BMI 30.81 kg/m2  SpO2 97% .  BMI Body mass index is 30.81 kg/(m^2).  Wt Readings from Last 3 Encounters:  08/11/15 190 lb 12.8  oz (86.546 kg)  06/02/15 192 lb 12.8 oz (87.454 kg)  11/24/14 195 lb (88.451 kg)    General: Pleasant. Well developed, well nourished and in no acute distress.  HEENT: Normal. Neck: Supple, no JVD, carotid bruits, or masses noted.  Cardiac: Regular rate and rhythm. No murmurs, rubs, or gallops. No edema.  Respiratory:  Lungs are clear to auscultation bilaterally with normal work of breathing.  GI: Soft and nontender.  MS: No deformity or atrophy. Gait and ROM intact. Skin: Warm and dry. Color is normal.  Neuro:  Strength and sensation are intact and no gross focal deficits noted.  Psych: Alert, appropriate and with normal affect.   LABORATORY DATA:  EKG:  EKG is not ordered today.   Lab Results  Component Value Date   HGB 13.3 POINT OF CARE RESULT 09/22/2008   GLUCOSE 134* 09/18/2008   NA 138 09/18/2008   K 4.3 09/24/2014   CL 104 09/18/2008   CREATININE 0.90 09/18/2008   BUN 11 09/18/2008   CO2 28 09/18/2008    BNP (last 3 results) No results for input(s): BNP in the last 8760 hours.  ProBNP (last 3 results) No results for input(s): PROBNP in the last 8760 hours.   Other Studies Reviewed Today:   Assessment/Plan: 1. CAD - Managed medically.   2. HTN - BP looks ok - recheck by me is 135/85.  No change in therapy   3. Situational stress/grief - she seems to be doing ok for the most part.   4. OA with recent MRI findings.   Current medicines are reviewed with the patient today.  The patient does not have concerns regarding medicines other than what has been noted above.  The following changes have been made:  See above.  Labs/ tests ordered today include:   No orders of the defined types were placed in this encounter.     Disposition:   FU with me in 6 months.   Patient is agreeable to this plan and will call if any problems develop in the interim.   Signed: Burtis Junes, RN, ANP-C 08/11/2015 12:16 PM  [facility] [address] [place], [place]  [address] Phone: [phone] Fax: [phone]

## 2015-08-11 NOTE — Patient Instructions (Addendum)
We will be checking the following labs today - NONE   Medication Instructions:    Continue with your current medicines.     Testing/Procedures To Be Arranged:  N/A  Follow-Up:   See me in 6 months    Other Special Instructions:   Call Dr. Virgina Jock for an appointment!  Call the [place] office at [phone] if you have any questions, problems or concerns.

## 2015-08-24 DIAGNOSIS — H3530 Unspecified macular degeneration: Secondary | ICD-10-CM | POA: Diagnosis not present

## 2015-08-24 DIAGNOSIS — Z6831 Body mass index (BMI) 31.0-31.9, adult: Secondary | ICD-10-CM | POA: Diagnosis not present

## 2015-08-24 DIAGNOSIS — R7302 Impaired glucose tolerance (oral): Secondary | ICD-10-CM | POA: Diagnosis not present

## 2015-08-24 DIAGNOSIS — E669 Obesity, unspecified: Secondary | ICD-10-CM | POA: Diagnosis not present

## 2015-08-24 DIAGNOSIS — R739 Hyperglycemia, unspecified: Secondary | ICD-10-CM | POA: Diagnosis not present

## 2015-08-24 DIAGNOSIS — M1990 Unspecified osteoarthritis, unspecified site: Secondary | ICD-10-CM | POA: Diagnosis not present

## 2015-08-24 DIAGNOSIS — F4320 Adjustment disorder, unspecified: Secondary | ICD-10-CM | POA: Diagnosis not present

## 2015-08-24 DIAGNOSIS — I10 Essential (primary) hypertension: Secondary | ICD-10-CM | POA: Diagnosis not present

## 2015-08-24 DIAGNOSIS — M810 Age-related osteoporosis without current pathological fracture: Secondary | ICD-10-CM | POA: Diagnosis not present

## 2015-08-24 DIAGNOSIS — E785 Hyperlipidemia, unspecified: Secondary | ICD-10-CM | POA: Diagnosis not present

## 2015-08-24 DIAGNOSIS — I119 Hypertensive heart disease without heart failure: Secondary | ICD-10-CM | POA: Diagnosis not present

## 2015-08-24 DIAGNOSIS — Z23 Encounter for immunization: Secondary | ICD-10-CM | POA: Diagnosis not present

## 2015-08-27 DIAGNOSIS — Z961 Presence of intraocular lens: Secondary | ICD-10-CM | POA: Diagnosis not present

## 2015-09-10 DIAGNOSIS — R7302 Impaired glucose tolerance (oral): Secondary | ICD-10-CM | POA: Diagnosis not present

## 2015-09-10 DIAGNOSIS — I2510 Atherosclerotic heart disease of native coronary artery without angina pectoris: Secondary | ICD-10-CM | POA: Diagnosis not present

## 2015-09-10 DIAGNOSIS — I10 Essential (primary) hypertension: Secondary | ICD-10-CM | POA: Diagnosis not present

## 2015-09-10 DIAGNOSIS — E785 Hyperlipidemia, unspecified: Secondary | ICD-10-CM | POA: Diagnosis not present

## 2015-09-10 DIAGNOSIS — M810 Age-related osteoporosis without current pathological fracture: Secondary | ICD-10-CM | POA: Diagnosis not present

## 2015-09-29 DIAGNOSIS — Z1212 Encounter for screening for malignant neoplasm of rectum: Secondary | ICD-10-CM | POA: Diagnosis not present

## 2016-01-14 ENCOUNTER — Encounter: Payer: Self-pay | Admitting: *Deleted

## 2016-02-15 ENCOUNTER — Ambulatory Visit: Payer: Medicare Other | Admitting: Nurse Practitioner

## 2016-02-23 ENCOUNTER — Ambulatory Visit (INDEPENDENT_AMBULATORY_CARE_PROVIDER_SITE_OTHER): Payer: Medicare Other | Admitting: Nurse Practitioner

## 2016-02-23 ENCOUNTER — Encounter: Payer: Self-pay | Admitting: Nurse Practitioner

## 2016-02-23 VITALS — BP 140/70 | HR 68 | Ht 66.0 in | Wt 185.8 lb

## 2016-02-23 DIAGNOSIS — I2510 Atherosclerotic heart disease of native coronary artery without angina pectoris: Secondary | ICD-10-CM | POA: Diagnosis not present

## 2016-02-23 DIAGNOSIS — I10 Essential (primary) hypertension: Secondary | ICD-10-CM | POA: Diagnosis not present

## 2016-02-23 DIAGNOSIS — F4321 Adjustment disorder with depressed mood: Secondary | ICD-10-CM

## 2016-02-23 NOTE — Progress Notes (Addendum)
CARDIOLOGY OFFICE NOTE  Date:  02/23/2016    Tanya Ho Date of Birth: 1926-04-22 Medical Record [id number]  PCP:  Tanya Reel, MD  Cardiologist:  Tanya Ho    Chief Complaint  Patient presents with  . Coronary Artery Disease  . Hyperlipidemia  . Hypertension    Follow up visit - seen for Tanya Ho    History of Present Illness: Tanya Ho is a 82 y.o. female who presents today for a 6 month check. Seen for Tanya Ho. Former patient of Tanya Ho. Has known mild/moderate CAD (maintained on chronic Plavix), HTN, HLD, palpitations, obesity, OA, hiatal hernia, GERD. She has her labs checked by Tanya Ho.   I saw her back in June of 2016. Her husband had just passed away earlier that month. I saw her back shortly thereafter. She has had issues with grief/coping. Last seen by me back in August.    Comes back today. Here alone today. She will have some fleeting chest pain - does use NTG on rare occasion - maybe twice since last seen here in August. Lots of GI issues as well. Breathing is ok. She is lonely. Some days she does not get out of her pajamas. Not sleeping well. She does not feel like is crying as much. Still limited by her knees/arthritis. Notes that she has gotten more forgetful - has actually gotten "mixed up" while driving. Sees Tanya Ho next month.   Past Medical History  Diagnosis Date  . SOB (shortness of breath)   . Coronary artery disease     NONOBSTRUCTIVE  . Palpitations   . Fatigue   . Back pain   . Knee pain   . Hyperlipidemia   . Bruises easily   . OA (osteoarthritis)   . Obesity   . Hypertension     Past Surgical History  Procedure Laterality Date  . Cardiac catheterization  05/11/2006    OVERALL CARDIAC SIZE AND SILHOUETTE ARE NORMAL. EF ESTIMATED 60%  . Kidney stones  1982  . Breast biopsy      LEFT BREAST  . Vaginal hysterectomy    . Knee arthroscopy  09/22/2008     Medications: Current Outpatient Prescriptions  Medication Sig  Dispense Refill  . ALPRAZolam (XANAX) 0.5 MG tablet Take 1 mg by mouth at bedtime.     . clopidogrel (PLAVIX) 75 MG tablet TAKE 1 TABLET BY MOUTH ONCE A DAY 30 tablet 6  . ergocalciferol (VITAMIN D2) 50000 UNITS capsule Take 50,000 Units by mouth once a week.      . furosemide (LASIX) 20 MG tablet Take 20 mg by mouth 2 (two) times daily.      . Ibuprofen (ADVIL PO) Take 400 mg by mouth as needed.     . metoprolol (LOPRESSOR) 100 MG tablet Take 1 tablet (100 mg total) by mouth 2 (two) times daily. 180 tablet 3  . nitroGLYCERIN (NITROSTAT) 0.4 MG SL tablet Place 1 tablet (0.4 mg total) under the tongue every 5 (five) minutes as needed for chest pain. 100 tablet 3  . pantoprazole (PROTONIX) 20 MG tablet Take 20 mg by mouth daily.    . pantoprazole (PROTONIX) 40 MG tablet Take 40 mg by mouth daily.     . ranitidine (ZANTAC) 300 MG tablet Take 300 mg by mouth at bedtime.     . simvastatin (ZOCOR) 20 MG tablet Take 20 mg by mouth every evening.    . traMADol (ULTRAM) 50 MG tablet TAKE 1-2  TABLETS BY MOUTH EVERY 6-8 HOURS AS NEEDED FOR PAIN  0  . triamterene-hydrochlorothiazide (DYAZIDE) 37.5-25 MG per capsule Take 1 capsule by mouth every morning.       No current facility-administered medications for this visit.    Allergies: Allergies  Allergen Reactions  . Codeine   . Demerol   . Morphine And Related     Social History: The patient  reports that she has never smoked. She has never used smokeless tobacco. She reports that she does not drink alcohol or use illicit drugs.   Family History: The patient's family history includes Heart attack in her father; Heart disease in her mother; Stroke in her mother.   Review of Systems: Please see the history of present illness.   Otherwise, the review of systems is positive for none.   All other systems are reviewed and negative.   Physical Exam: VS:  BP 140/70 mmHg  Pulse 68  Ht 5\' 6"  (1.676 m)  Wt 185 lb 12.8 oz (84.278 kg)  BMI 30.00 kg/m2  .  BMI Body mass index is 30 kg/(m^2).  Wt Readings from Last 3 Encounters:  02/23/16 185 lb 12.8 oz (84.278 kg)  08/11/15 190 lb 12.8 oz (86.546 kg)  06/02/15 192 lb 12.8 oz (87.454 kg)    General: Pleasant but she is depressed. She is alert and in no acute distress. She has lost weight - down 5 pounds since last visit.   HEENT: Normal. Neck: Supple, no JVD, carotid bruits, or masses noted.  Cardiac: Regular rate and rhythm. No murmurs, rubs, or gallops. No edema.  Respiratory:  Lungs are clear to auscultation bilaterally with normal work of breathing.  GI: Soft and nontender.  MS: No deformity or atrophy. Gait and ROM intact. Skin: Warm and dry. Color is normal.  Neuro:  Strength and sensation are intact and no gross focal deficits noted.  Psych: Alert, appropriate and with normal affect.   LABORATORY DATA:  EKG:  EKG is not ordered today.  Lab Results  Component Value Date   HGB 13.3 POINT OF CARE RESULT 09/22/2008   GLUCOSE 134* 09/18/2008   NA 138 09/18/2008   K 4.3 09/24/2014   CL 104 09/18/2008   CREATININE 0.90 09/18/2008   BUN 11 09/18/2008   CO2 28 09/18/2008    BNP (last 3 results) No results for input(s): BNP in the last 8760 hours.  ProBNP (last 3 results) No results for input(s): PROBNP in the last 8760 hours.   Other Studies Reviewed Today:   Assessment/Plan: 1. CAD - Managed medically. No real change in her symptoms. Would continue with her current regimen.   2. HTN - BP looks ok. No change in therapy   3. Situational stress/grief - she seems more depressed to me today - seeing Tanya Ho next month - I asked her to talk to him about antidepressant therapy.   4. OA with recent MRI findings.   Current medicines are reviewed with the patient today.  The patient does not have concerns regarding medicines other than what has been noted above.  The following changes have been made:  See above.  Labs/ tests ordered today include:   No orders of  the defined types were placed in this encounter.     Disposition:   FU with me in 6 months.   Patient is agreeable to this plan and will call if any problems develop in the interim.   Signed: Burtis Junes, RN, ANP-C 02/23/2016 12:08  [facility] [address] [place], [place]  [address] Phone: [phone] Fax: [phone]

## 2016-02-23 NOTE — Patient Instructions (Addendum)
We will be checking the following labs today - NONE   Medication Instructions:    Continue with your current medicines.     Testing/Procedures To Be Arranged:  N/A  Follow-Up:   See me in 6 months.     Other Special Instructions:   I will send a note to Dr. Virgina Jock today    If you need a refill on your cardiac medications before your next appointment, please call your pharmacy.   Call the [place] office at [phone] if you have any questions, problems or concerns.

## 2016-03-02 DIAGNOSIS — D225 Melanocytic nevi of trunk: Secondary | ICD-10-CM | POA: Diagnosis not present

## 2016-03-02 DIAGNOSIS — L728 Other follicular cysts of the skin and subcutaneous tissue: Secondary | ICD-10-CM | POA: Diagnosis not present

## 2016-03-02 DIAGNOSIS — L570 Actinic keratosis: Secondary | ICD-10-CM | POA: Diagnosis not present

## 2016-03-02 DIAGNOSIS — L821 Other seborrheic keratosis: Secondary | ICD-10-CM | POA: Diagnosis not present

## 2016-08-08 ENCOUNTER — Encounter: Payer: Self-pay | Admitting: Nurse Practitioner

## 2016-08-08 DIAGNOSIS — M1712 Unilateral primary osteoarthritis, left knee: Secondary | ICD-10-CM | POA: Diagnosis not present

## 2016-08-08 DIAGNOSIS — M1711 Unilateral primary osteoarthritis, right knee: Secondary | ICD-10-CM | POA: Diagnosis not present

## 2016-08-08 DIAGNOSIS — M25561 Pain in right knee: Secondary | ICD-10-CM | POA: Diagnosis not present

## 2016-08-08 DIAGNOSIS — M25562 Pain in left knee: Secondary | ICD-10-CM | POA: Diagnosis not present

## 2016-08-23 ENCOUNTER — Encounter: Payer: Self-pay | Admitting: Nurse Practitioner

## 2016-08-23 ENCOUNTER — Ambulatory Visit (INDEPENDENT_AMBULATORY_CARE_PROVIDER_SITE_OTHER): Payer: Medicare Other | Admitting: Nurse Practitioner

## 2016-08-23 VITALS — BP 140/76 | HR 66 | Wt 186.4 lb

## 2016-08-23 DIAGNOSIS — I2510 Atherosclerotic heart disease of native coronary artery without angina pectoris: Secondary | ICD-10-CM

## 2016-08-23 DIAGNOSIS — I10 Essential (primary) hypertension: Secondary | ICD-10-CM | POA: Diagnosis not present

## 2016-08-23 NOTE — Patient Instructions (Addendum)
We will be checking the following labs today - NONE   Medication Instructions:    Continue with your current medicines.     Testing/Procedures To Be Arranged:  N/A  Follow-Up:   See me in 6 months.    Other Special Instructions:   N/A    If you need a refill on your cardiac medications before your next appointment, please call your pharmacy.   Call the [facility] office at [phone] if you have any questions, problems or concerns.      

## 2016-08-23 NOTE — Progress Notes (Signed)
CARDIOLOGY OFFICE NOTE  Date:  08/23/2016    Tanya Ho Date of Birth: November 13, 1926 Medical Record [id number]  PCP:  Tanya Reel, MD  Cardiologist:  Tanya Ho  Chief Complaint  Patient presents with  . Coronary Artery Disease    Follow up visit - seen for Dr. Acie Ho    History of Present Illness: Tanya Ho is a 82 y.o. female who presents today for a follow up visit. Seen for Dr. Acie Ho. Former patient of Dr. Susa Ho.   Has known mild/moderate CAD (maintained on chronic Plavix), HTN, HLD, palpitations, obesity, OA, hiatal hernia, GERD. She has her labs checked by Dr. Virgina Ho.   I saw her back in June of 2016. Her husband had just passed away earlier that month. I saw her back shortly thereafter. She has had issues with grief/coping. Last seen by me back in March and she was doing ok. Less grief. Was getting more forgetful.    Comes back today. Here alone today. Still nervous about staying by herself at night. Lots of body aches. Had some knee injections. Does not really know her medicines. Just got back from a week at the beach. Has good days and bad days. Her back seems to be her most limiting factor. Seeing Dr. Virgina Ho later this month. Says he gave her something for depression - she does not know what it was called - a nurse friend told her it would cause hallucinations and advised something else. She only took it one night and thought she heard some noises behind her bed. She has not taken it anymore. Her friend did not feel like should be on this. She tends to always hear things at night - has even without taking the medicine. Getting by without it.   Past Medical History:  Diagnosis Date  . Back pain   . Bruises easily   . Coronary artery disease    NONOBSTRUCTIVE  . Fatigue   . Hyperlipidemia   . Hypertension   . Knee pain   . OA (osteoarthritis)   . Obesity   . Palpitations   . SOB (shortness of breath)     Past Surgical History:  Procedure  Laterality Date  . BREAST BIOPSY     LEFT BREAST  . CARDIAC CATHETERIZATION  05/11/2006   OVERALL CARDIAC SIZE AND SILHOUETTE ARE NORMAL. EF ESTIMATED 60%  . KIDNEY STONES  1982  . KNEE ARTHROSCOPY  09/22/2008  . VAGINAL HYSTERECTOMY       Medications: Current Outpatient Prescriptions  Medication Sig Dispense Refill  . ALPRAZolam (XANAX) 0.5 MG tablet Take 1 mg by mouth at bedtime.     . clopidogrel (PLAVIX) 75 MG tablet TAKE 1 TABLET BY MOUTH ONCE A DAY 30 tablet 6  . ergocalciferol (VITAMIN D2) 50000 UNITS capsule Take 50,000 Units by mouth once a week.      . furosemide (LASIX) 20 MG tablet Take 20 mg by mouth 2 (two) times daily.      . Ibuprofen (ADVIL PO) Take 400 mg by mouth as needed.     . metoprolol (LOPRESSOR) 100 MG tablet Take 1 tablet (100 mg total) by mouth 2 (two) times daily. 180 tablet 3  . nitroGLYCERIN (NITROSTAT) 0.4 MG SL tablet Place 1 tablet (0.4 mg total) under the tongue every 5 (five) minutes as needed for chest pain. 100 tablet 3  . pantoprazole (PROTONIX) 20 MG tablet Take 20 mg by mouth daily.    . pantoprazole (PROTONIX)  40 MG tablet Take 40 mg by mouth daily.     . ranitidine (ZANTAC) 300 MG tablet Take 300 mg by mouth at bedtime.     . simvastatin (ZOCOR) 20 MG tablet Take 20 mg by mouth every evening.    . traMADol (ULTRAM) 50 MG tablet TAKE 1-2 TABLETS BY MOUTH EVERY 6-8 HOURS AS NEEDED FOR PAIN  0  . triamterene-hydrochlorothiazide (DYAZIDE) 37.5-25 MG per capsule Take 1 capsule by mouth every morning.       No current facility-administered medications for this visit.     Allergies: Allergies  Allergen Reactions  . Codeine   . Demerol   . Morphine And Related     Social History: The patient  reports that she has never smoked. She has never used smokeless tobacco. She reports that she does not drink alcohol or use drugs.   Family History: The patient's family history includes Heart attack in her father; Heart disease in her mother; Stroke in  her mother.   Review of Systems: Please see the history of present illness.   Otherwise, the review of systems is positive for none.   All other systems are reviewed and negative.   Physical Exam: VS:  BP 140/76   Pulse 66   Wt 186 lb 6.4 oz (84.6 kg)   SpO2 97% Comment: at rest  BMI 30.09 kg/m  .  BMI Body mass index is 30.09 kg/m.  Wt Readings from Last 3 Encounters:  08/23/16 186 lb 6.4 oz (84.6 kg)  02/23/16 185 lb 12.8 oz (84.3 kg)  08/11/15 190 lb 12.8 oz (86.5 kg)    General: Pleasant. Elderly female who is alert and in no acute distress.   HEENT: Normal.  Neck: Supple, no JVD, carotid bruits, or masses noted.  Cardiac: Regular rate and rhythm. No murmurs, rubs, or gallops. No edema.  Respiratory:  Lungs are clear to auscultation bilaterally with normal work of breathing.  GI: Soft and nontender.  MS: No deformity or atrophy. Gait and ROM intact.  Skin: Warm and dry. Color is normal.  Neuro:  Strength and sensation are intact and no gross focal deficits noted.  Psych: Alert, appropriate and with normal affect.   LABORATORY DATA:  EKG:  EKG is not ordered today.  Lab Results  Component Value Date   HGB 13.3 POINT OF CARE RESULT 09/22/2008   GLUCOSE 134 (H) 09/18/2008   NA 138 09/18/2008   K 4.3 09/24/2014   CL 104 09/18/2008   CREATININE 0.90 09/18/2008   BUN 11 09/18/2008   CO2 28 09/18/2008    BNP (last 3 results) No results for input(s): BNP in the last 8760 hours.  ProBNP (last 3 results) No results for input(s): PROBNP in the last 8760 hours.   Other Studies Reviewed Today:   Assessment/Plan: 1. CAD - Managed medically. No real change in her symptoms. Would continue with her current regimen.   2. HTN - BP looks ok. No change in therapy   3. Situational stress/grief - seems a little better today. Will defer further treatment to Dr. Virgina Ho.   4. OA with recent MRI findings.   Current medicines are reviewed with the patient today.  The  patient does not have concerns regarding medicines other than what has been noted above.  The following changes have been made:  See above.  Labs/ tests ordered today include:   No orders of the defined types were placed in this encounter.    Disposition:  FU with me in 6 months.   Patient is agreeable to this plan and will call if any problems develop in the interim.   Signed: Burtis Junes, RN, ANP-C 08/23/2016 11:54 AM  [facility] [address] [place], [place]  [address] Phone: [phone] Fax: [phone]

## 2016-08-28 DIAGNOSIS — H43813 Vitreous degeneration, bilateral: Secondary | ICD-10-CM | POA: Diagnosis not present

## 2016-09-11 DIAGNOSIS — E784 Other hyperlipidemia: Secondary | ICD-10-CM | POA: Diagnosis not present

## 2016-09-11 DIAGNOSIS — E559 Vitamin D deficiency, unspecified: Secondary | ICD-10-CM | POA: Diagnosis not present

## 2016-09-11 DIAGNOSIS — I10 Essential (primary) hypertension: Secondary | ICD-10-CM | POA: Diagnosis not present

## 2016-09-11 DIAGNOSIS — R8299 Other abnormal findings in urine: Secondary | ICD-10-CM | POA: Diagnosis not present

## 2016-09-11 DIAGNOSIS — R7302 Impaired glucose tolerance (oral): Secondary | ICD-10-CM | POA: Diagnosis not present

## 2016-09-11 DIAGNOSIS — R7881 Bacteremia: Secondary | ICD-10-CM | POA: Diagnosis not present

## 2016-12-23 IMAGING — CR DG RIBS BILAT 3V
4 series · 4 of 4 positions shown · non-contrast
Comparison: Chest x-ray of 09/18/2008

CLINICAL DATA: Fell on 05/24/2015 with left lateral posterior rib
pain

EXAM:
BILATERAL RIBS - 3+ VIEW

[view not recorded (1 of 4)]
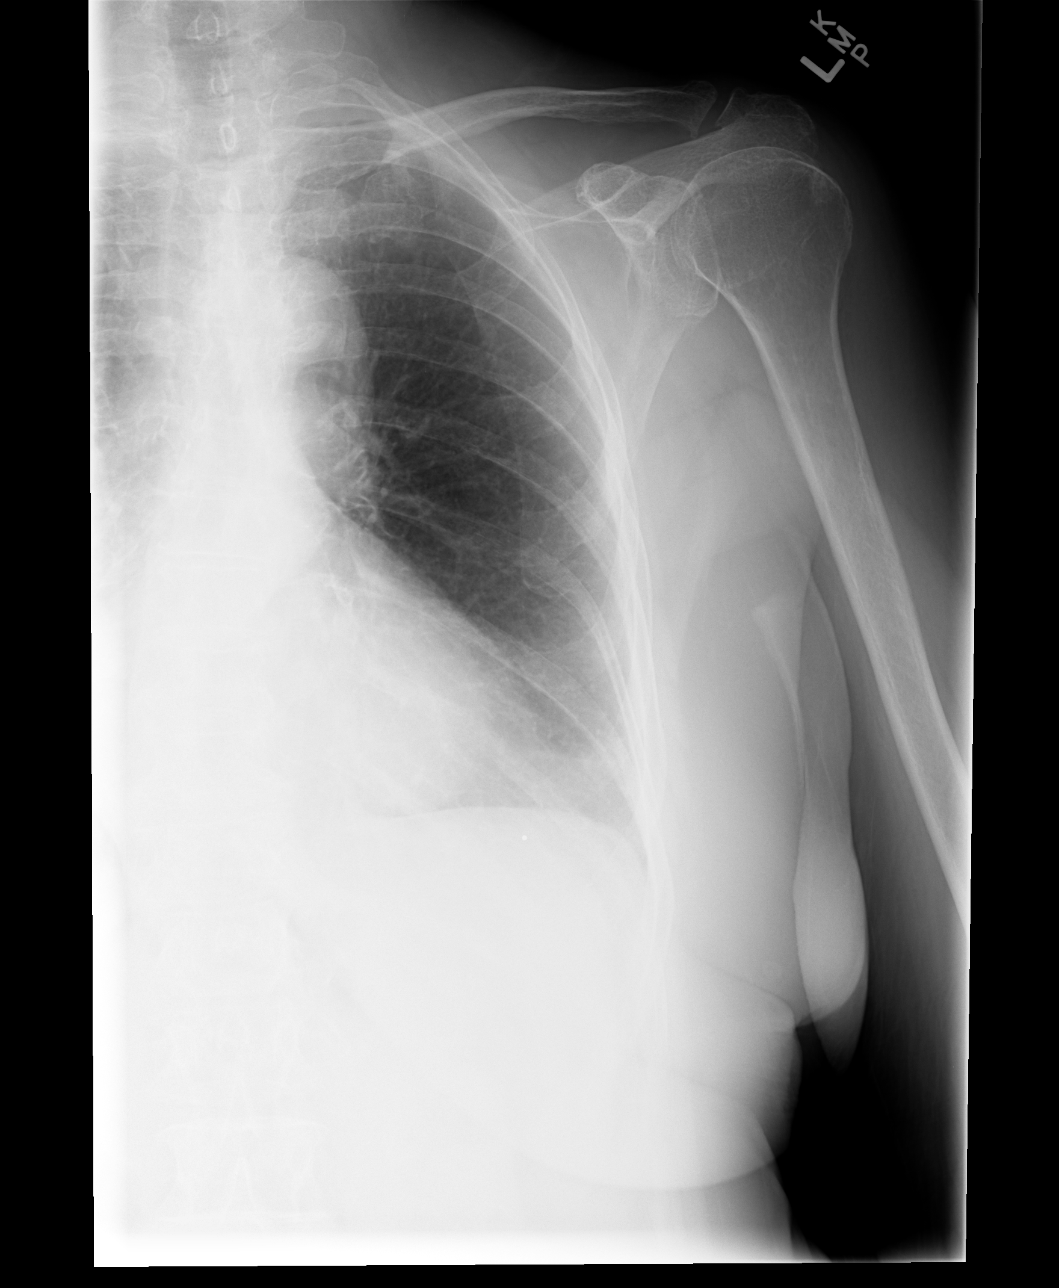

[view not recorded (2 of 4)]
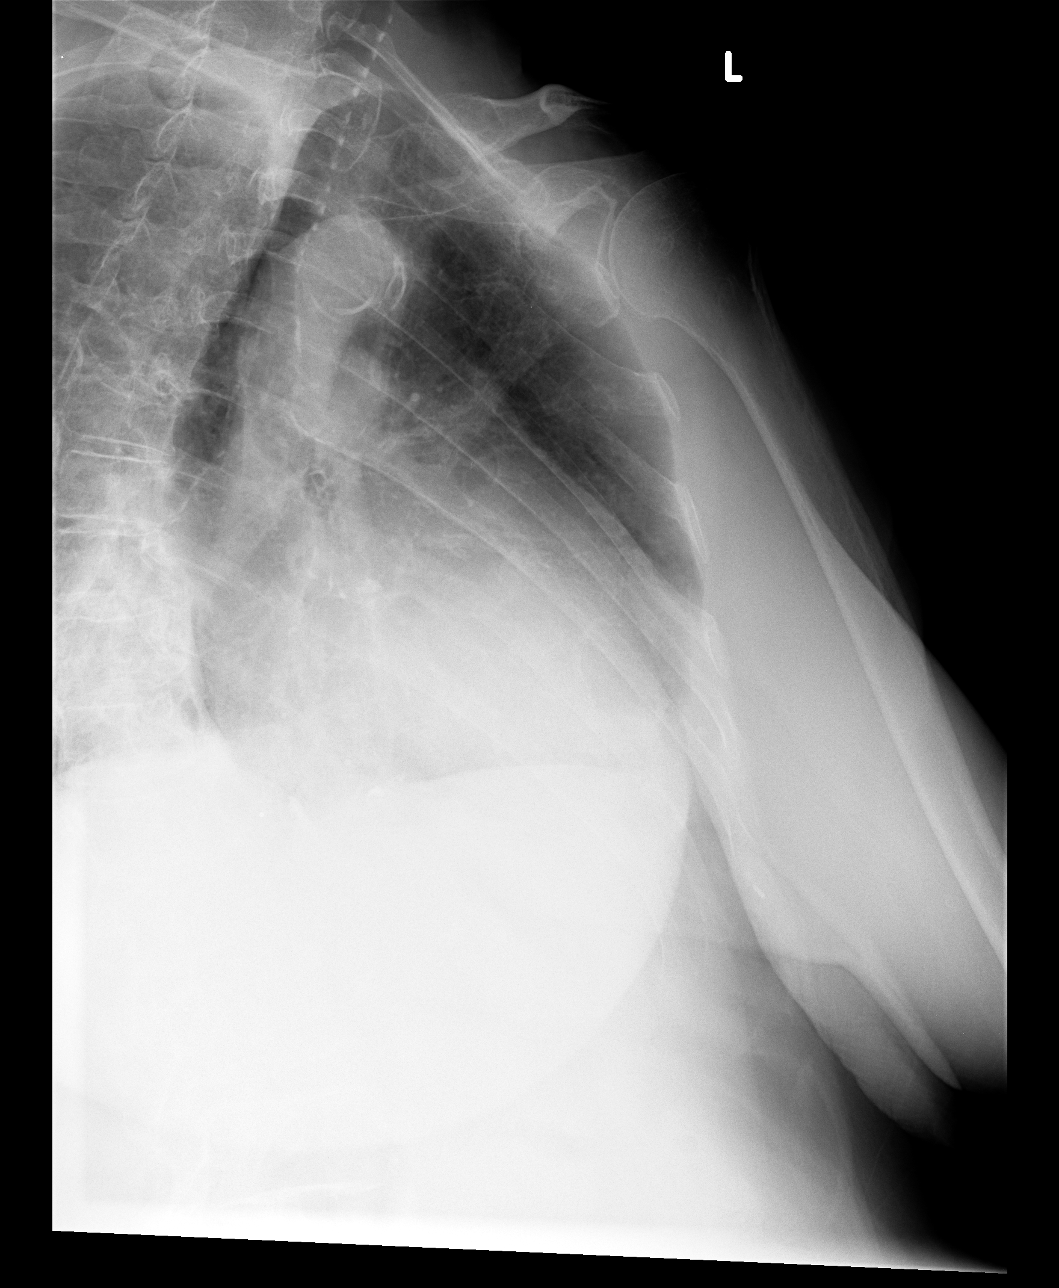

[view not recorded (3 of 4)]
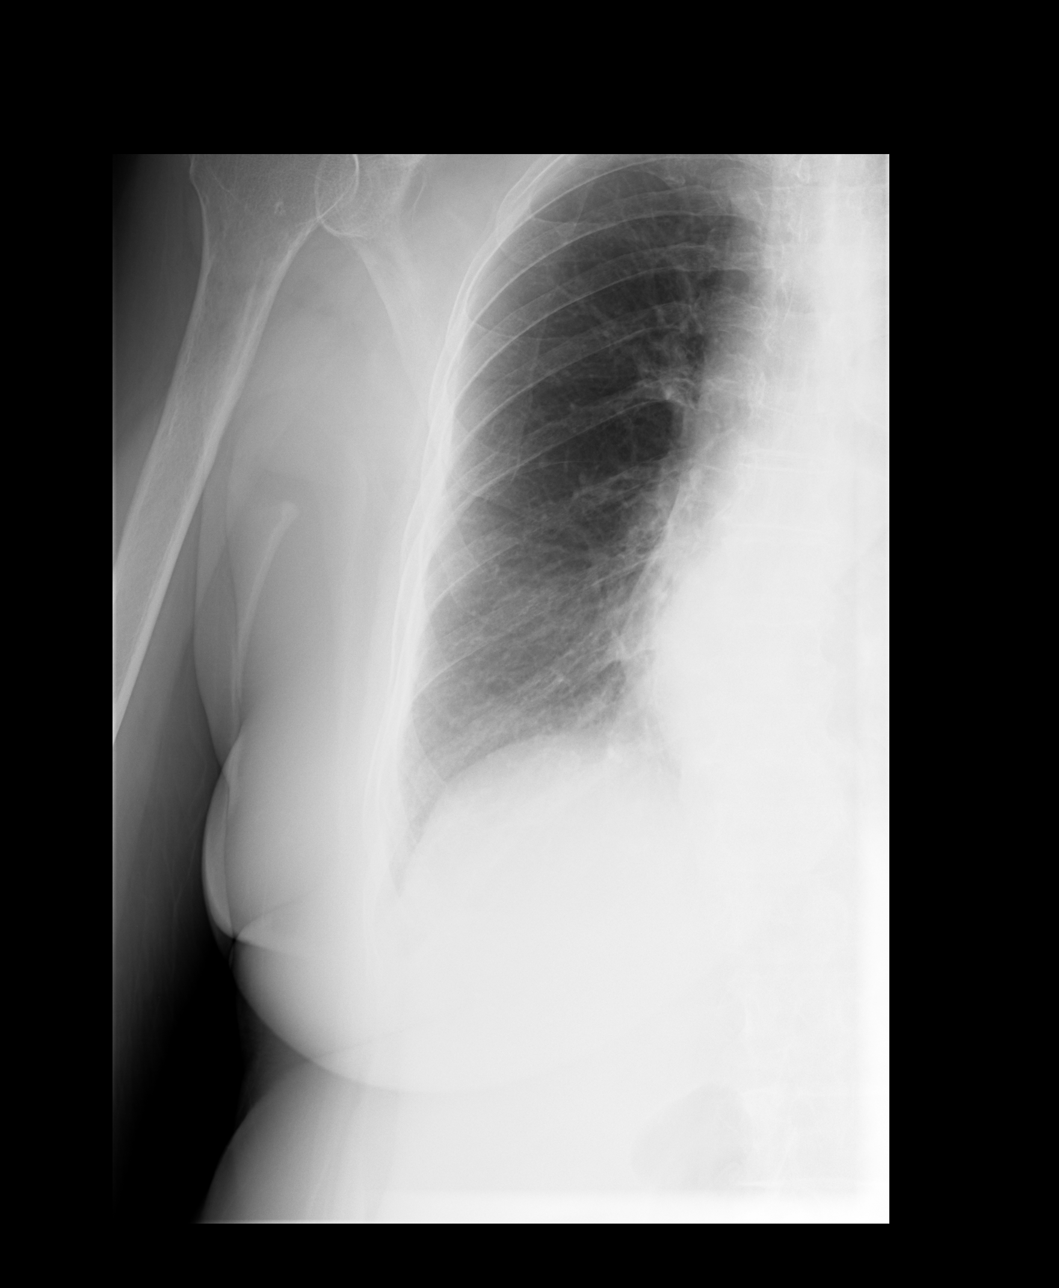

[view not recorded (4 of 4)]
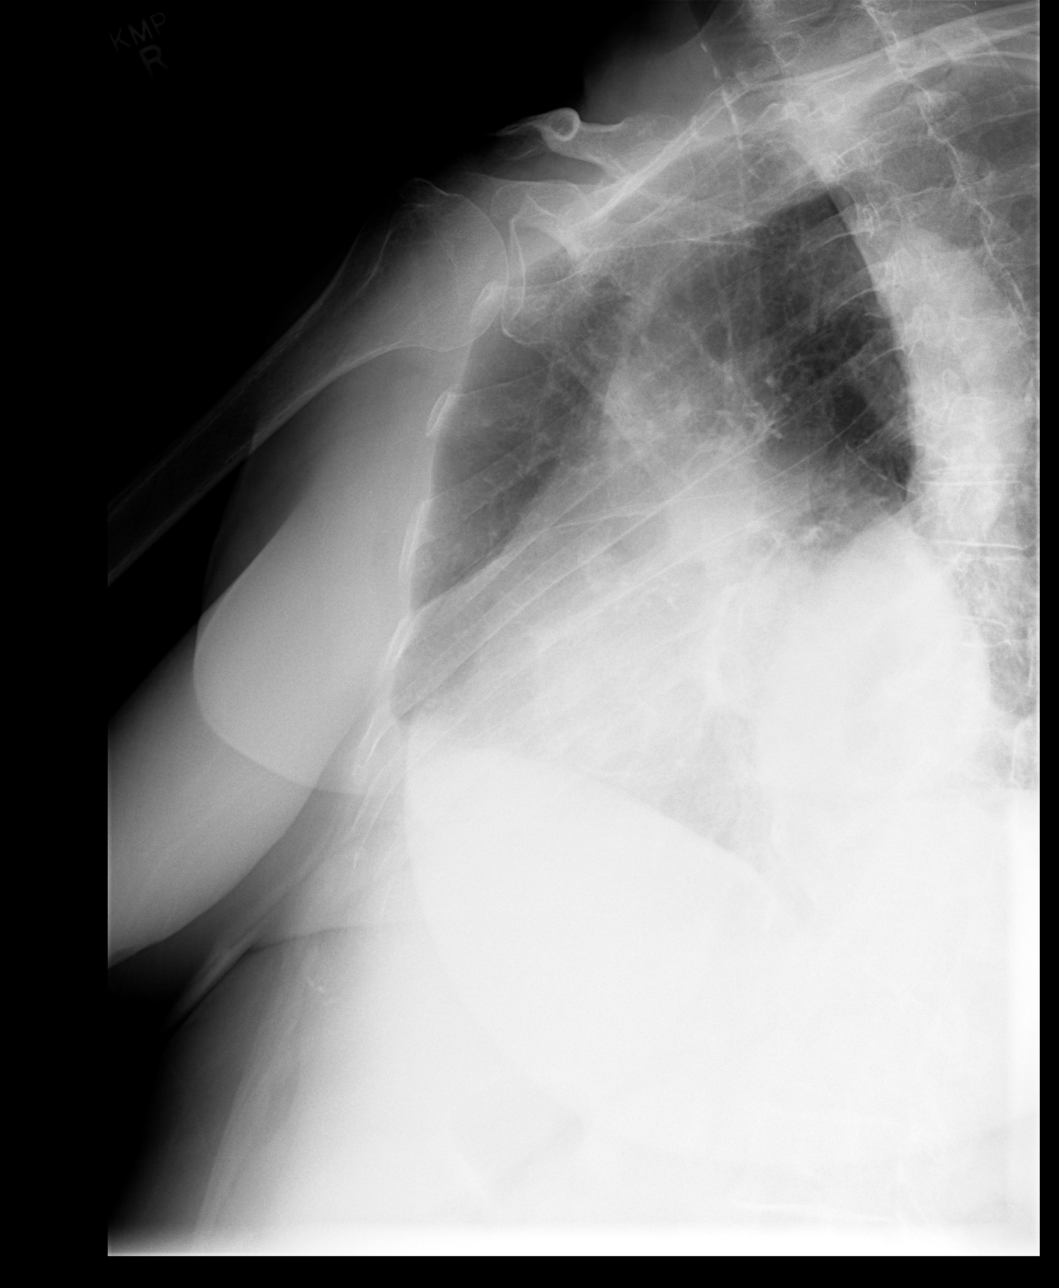

[4 of 4 positions shown; findings below may reference images not displayed]

FINDINGS: Bilateral rib detail films are rather light in technique. However no
acute fracture is seen. The lungs appear clear.
IMPRESSION: Negative bilateral rib detail.

## 2017-02-26 NOTE — Progress Notes (Deleted)
CARDIOLOGY OFFICE NOTE  Date:  02/27/2017    [name] Date of Birth: 22-Jan-1926 Medical Record #[id number]  PCP:  Precious Reel, MD  Cardiologist:  [facility]  No chief complaint on file.   History of Present Illness: Tanya Ho is a 83 y.o. female who presents today for a 6 month check. Seen for Dr. Acie Fredrickson. Former patient of Dr. Susa Simmonds.   Has known mild/moderate CAD (maintained on chronic Plavix), HTN, HLD, palpitations, obesity, OA, hiatal hernia, GERD. She has her labs checked by Dr. Virgina Jock.   I saw her back in June of 2016. Her husband had just passed away earlier that month. I saw her back shortly thereafter. She has had issues with grief/coping. Last seen by me back in September and she was doing ok for the most part. Less grief. Was getting more forgetful. Still nervous about staying by herself at night. Seemed more limited by back pain and general health issues - her cardiac status was ok.   Comes back today. Here alone today.   Past Medical History:  Diagnosis Date  . Back pain   . Bruises easily   . Coronary artery disease    NONOBSTRUCTIVE  . Fatigue   . Hyperlipidemia   . Hypertension   . Knee pain   . OA (osteoarthritis)   . Obesity   . Palpitations   . SOB (shortness of breath)     Past Surgical History:  Procedure Laterality Date  . BREAST BIOPSY     LEFT BREAST  . CARDIAC CATHETERIZATION  05/11/2006   OVERALL CARDIAC SIZE AND SILHOUETTE ARE NORMAL. EF ESTIMATED 60%  . KIDNEY STONES  1982  . KNEE ARTHROSCOPY  09/22/2008  . VAGINAL HYSTERECTOMY       Medications: Current Outpatient Prescriptions  Medication Sig Dispense Refill  . ALPRAZolam (XANAX) 0.5 MG tablet Take 1 mg by mouth at bedtime.     . clopidogrel (PLAVIX) 75 MG tablet TAKE 1 TABLET BY MOUTH ONCE A DAY 30 tablet 6  . ergocalciferol (VITAMIN D2) 50000 UNITS capsule Take 50,000 Units by mouth once a week.      . furosemide (LASIX) 20 MG tablet Take 20 mg by mouth  2 (two) times daily.      . Ibuprofen (ADVIL PO) Take 400 mg by mouth as needed.     . metoprolol (LOPRESSOR) 100 MG tablet Take 1 tablet (100 mg total) by mouth 2 (two) times daily. 180 tablet 3  . nitroGLYCERIN (NITROSTAT) 0.4 MG SL tablet Place 1 tablet (0.4 mg total) under the tongue every 5 (five) minutes as needed for chest pain. 100 tablet 3  . pantoprazole (PROTONIX) 20 MG tablet Take 20 mg by mouth daily.    . pantoprazole (PROTONIX) 40 MG tablet Take 40 mg by mouth daily.     . ranitidine (ZANTAC) 300 MG tablet Take 300 mg by mouth at bedtime.     . simvastatin (ZOCOR) 20 MG tablet Take 20 mg by mouth every evening.    . traMADol (ULTRAM) 50 MG tablet TAKE 1-2 TABLETS BY MOUTH EVERY 6-8 HOURS AS NEEDED FOR PAIN  0  . triamterene-hydrochlorothiazide (DYAZIDE) 37.5-25 MG per capsule Take 1 capsule by mouth every morning.       No current facility-administered medications for this visit.     Allergies: Allergies  Allergen Reactions  . Codeine   . Demerol   . Morphine And Related     Social History: The  patient  reports that she has never smoked. She has never used smokeless tobacco. She reports that she does not drink alcohol or use drugs.   Family History: The patient's family history includes Heart attack in her father; Heart disease in her mother; Stroke in her mother.   Review of Systems: Please see the history of present illness.   Otherwise, the review of systems is positive for none.   All other systems are reviewed and negative.   Physical Exam: VS:  There were no vitals taken for this visit. Marland Kitchen  BMI There is no height or weight on file to calculate BMI.  Wt Readings from Last 3 Encounters:  08/23/16 186 lb 6.4 oz (84.6 kg)  02/23/16 185 lb 12.8 oz (84.3 kg)  08/11/15 190 lb 12.8 oz (86.5 kg)    General: Pleasant. Well developed, well nourished and in no acute distress.   HEENT: Normal.  Neck: Supple, no JVD, carotid bruits, or masses noted.  Cardiac: Regular  rate and rhythm. No murmurs, rubs, or gallops. No edema.  Respiratory:  Lungs are clear to auscultation bilaterally with normal work of breathing.  GI: Soft and nontender.  MS: No deformity or atrophy. Gait and ROM intact.  Skin: Warm and dry. Color is normal.  Neuro:  Strength and sensation are intact and no gross focal deficits noted.  Psych: Alert, appropriate and with normal affect.   LABORATORY DATA:  EKG:  EKG is not ordered today.  Lab Results  Component Value Date   HGB 13.3 POINT OF CARE RESULT 09/22/2008   GLUCOSE 134 (H) 09/18/2008   NA 138 09/18/2008   K 4.3 09/24/2014   CL 104 09/18/2008   CREATININE 0.90 09/18/2008   BUN 11 09/18/2008   CO2 28 09/18/2008    BNP (last 3 results) No results for input(s): BNP in the last 8760 hours.  ProBNP (last 3 results) No results for input(s): PROBNP in the last 8760 hours.   Other Studies Reviewed Today:   Assessment/Plan:  1. CAD - Managed medically. No real change in her symptoms. Would continue with her current regimen.   2. HTN - BP looks ok. No change in therapy   3. Situational stress/grief - seems a little better today. Will defer further treatment to Dr. Virgina Jock.   4. OA with recent MRI findings.   Current medicines are reviewed with the patient today.  The patient does not have concerns regarding medicines other than what has been noted above.  The following changes have been made:  See above.  Labs/ tests ordered today include:   No orders of the defined types were placed in this encounter.    Disposition:   FU with me in 6 months.   Patient is agreeable to this plan and will call if any problems develop in the interim.   SignedTruitt Merle, NP  02/27/2017 10:53 AM  [facility] [address] [place], [place]  [address] Phone: [phone] Fax: [phone]

## 2017-02-27 ENCOUNTER — Ambulatory Visit: Payer: Medicare Other | Admitting: Nurse Practitioner

## 2017-03-13 NOTE — Progress Notes (Signed)
CARDIOLOGY OFFICE NOTE  Date:  03/14/2017    Tanya Ho Date of Birth: 1926-03-04 Medical Record #[id number]  PCP:  Precious Reel, MD  Cardiologist:  [facility]    Chief Complaint  Patient presents with  . Coronary Artery Disease    6 month check - seen for Dr. Acie Fredrickson    History of Present Illness: Tanya Ho is a 83 y.o. female who presents today for a 6 month check. Seen for Dr. Acie Fredrickson. Former patient of Dr. Susa Simmonds.   Has known mild/moderate CAD (maintained on chronic Plavix) from cardiac cath back in 2007, HTN, HLD, palpitations, obesity, OA, hiatal hernia, GERD. She has her labs checked by Dr. Virgina Jock.   I saw her back in June of 2016. Her husband had just passed away earlier that month. I saw her back shortly thereafter. She has had issues with grief/coping. Last seen by me back in March and she was doing ok. Less grief. Was getting more forgetful. Last seen back in September and overall she was doing ok - still quite depressed.   Comes back today. Here alone today. She just saw Dr. Virgina Jock and had labs. She has stopped her Lasix - says it "hurts my bones" and is tired of the urinary frequency that it causes. Limited by shoulder pain, back pain - very debilitating for her - has to rest and uses ice/pain medicines. Sounds like she may be having more chest pain. She will use NTG. Had chest pain yesterday - this was after eating barbeque - she took 4 TUMS - ended up using NTG and got relief. Has used several NTGs since last here - admits she gets scared easily. She is on chronic PPI and Zantac. BP typically high during these spells. She is calling her family during the night to see if she needs to go to the hospital. No headache with NTG noted. She still hears things at night and is frightened easily. Dr. Virgina Jock gave her something (sounds like Trazadone) but she is afraid to take.   Past Medical History:  Diagnosis Date  . Back pain   . Bruises easily   . Coronary  artery disease    NONOBSTRUCTIVE  . Fatigue   . Hyperlipidemia   . Hypertension   . Knee pain   . OA (osteoarthritis)   . Obesity   . Palpitations   . SOB (shortness of breath)     Past Surgical History:  Procedure Laterality Date  . BREAST BIOPSY     LEFT BREAST  . CARDIAC CATHETERIZATION  05/11/2006   OVERALL CARDIAC SIZE AND SILHOUETTE ARE NORMAL. EF ESTIMATED 60%  . KIDNEY STONES  1982  . KNEE ARTHROSCOPY  09/22/2008  . VAGINAL HYSTERECTOMY       Medications: Current Outpatient Prescriptions  Medication Sig Dispense Refill  . ALPRAZolam (XANAX) 0.5 MG tablet Take 1 mg by mouth at bedtime.     . clopidogrel (PLAVIX) 75 MG tablet TAKE 1 TABLET BY MOUTH ONCE A DAY 30 tablet 6  . ergocalciferol (VITAMIN D2) 50000 UNITS capsule Take 50,000 Units by mouth once a week.      . Ibuprofen (ADVIL PO) Take 400 mg by mouth as needed.     . metoprolol (LOPRESSOR) 100 MG tablet Take 1 tablet (100 mg total) by mouth 2 (two) times daily. 180 tablet 3  . nitroGLYCERIN (NITROSTAT) 0.4 MG SL tablet Place 1 tablet (0.4 mg total) under the tongue every 5 (five) minutes  as needed for chest pain. 100 tablet 3  . pantoprazole (PROTONIX) 40 MG tablet Take 40 mg by mouth daily.     . ranitidine (ZANTAC) 300 MG tablet Take 300 mg by mouth at bedtime.     . simvastatin (ZOCOR) 20 MG tablet Take 20 mg by mouth every evening.    . traMADol (ULTRAM) 50 MG tablet TAKE 1-2 TABLETS BY MOUTH EVERY 6-8 HOURS AS NEEDED FOR PAIN  0  . triamterene-hydrochlorothiazide (DYAZIDE) 37.5-25 MG per capsule Take 1 capsule by mouth every morning.      . isosorbide mononitrate (IMDUR) 30 MG 24 hr tablet Take 1 tablet (30 mg total) by mouth daily. 90 tablet 3   No current facility-administered medications for this visit.     Allergies: Allergies  Allergen Reactions  . Codeine   . Demerol   . Morphine And Related     Social History: The patient  reports that she has never smoked. She has never used smokeless  tobacco. She reports that she does not drink alcohol or use drugs.   Family History: The patient's family history includes Heart attack in her father; Heart disease in her mother; Stroke in her mother.   Review of Systems: Please see the history of present illness.   Otherwise, the review of systems is positive for none.   All other systems are reviewed and negative.   Physical Exam: VS:  BP 130/84   Pulse 65   Ht 5\' 7"  (1.702 m)   Wt 188 lb 1.9 oz (85.3 kg)   BMI 29.46 kg/m  .  BMI Body mass index is 29.46 kg/m.  Wt Readings from Last 3 Encounters:  03/14/17 188 lb 1.9 oz (85.3 kg)  08/23/16 186 lb 6.4 oz (84.6 kg)  02/23/16 185 lb 12.8 oz (84.3 kg)    General: Pleasant. Elderly female who is alert and in no acute distress.   HEENT: Normal.  Neck: Supple, no JVD, carotid bruits, or masses noted.  Cardiac: Regular rate and rhythm. No murmurs, rubs, or gallops. No edema.  Respiratory:  Lungs are clear to auscultation bilaterally with normal work of breathing.  GI: Soft and nontender.  MS: No deformity or atrophy. Gait and ROM intact.  Skin: Warm and dry. Color is normal.  Neuro:  Strength and sensation are intact and no gross focal deficits noted.  Psych: Alert, appropriate and with normal affect.   LABORATORY DATA:  EKG:  EKG is ordered today. This shows NSR.   Lab Results  Component Value Date   HGB 13.3 POINT OF CARE RESULT 09/22/2008   GLUCOSE 134 (H) 09/18/2008   NA 138 09/18/2008   K 4.3 09/24/2014   CL 104 09/18/2008   CREATININE 0.90 09/18/2008   BUN 11 09/18/2008   CO2 28 09/18/2008    BNP (last 3 results) No results for input(s): BNP in the last 8760 hours.  ProBNP (last 3 results) No results for input(s): PROBNP in the last 8760 hours.   Other Studies Reviewed Today:   Assessment/Plan:  1. CAD - Managed medically. She may be having more symptoms. She is on Zantac as well as chronic PPI - should not be having reflux in my opinion. Will add low  dose Imdur and see back in about a month.   2. HTN - BP looks ok. No change in therapy   3. Situational stress/grief - [name] has been dead now almost 2 years. She still struggles. This probably drives a lot of her  symptoms.  I don't have an issue with her trying Trazadone.   4. OA   Current medicines are reviewed with the patient today.  The patient does not have concerns regarding medicines other than what has been noted above.  The following changes have been made:  See above.  Labs/ tests ordered today include:    Orders Placed This Encounter  Procedures  . EKG 12-Lead     Disposition:   FU with me in 6 months.   Patient is agreeable to this plan and will call if any problems develop in the interim.   SignedTruitt Merle, NP  03/14/2017 12:08 PM  [facility] [address] [place], [place]  [address] Phone: [phone] Fax: [phone]

## 2017-03-14 ENCOUNTER — Encounter: Payer: Self-pay | Admitting: Nurse Practitioner

## 2017-03-14 ENCOUNTER — Ambulatory Visit (INDEPENDENT_AMBULATORY_CARE_PROVIDER_SITE_OTHER): Payer: Medicare Other | Admitting: Nurse Practitioner

## 2017-03-14 VITALS — BP 130/84 | HR 65 | Ht 67.0 in | Wt 188.1 lb

## 2017-03-14 DIAGNOSIS — I10 Essential (primary) hypertension: Secondary | ICD-10-CM

## 2017-03-14 DIAGNOSIS — I2510 Atherosclerotic heart disease of native coronary artery without angina pectoris: Secondary | ICD-10-CM

## 2017-03-14 DIAGNOSIS — F4320 Adjustment disorder, unspecified: Secondary | ICD-10-CM

## 2017-03-14 DIAGNOSIS — F4321 Adjustment disorder with depressed mood: Secondary | ICD-10-CM

## 2017-03-14 MED ORDER — ISOSORBIDE MONONITRATE ER 30 MG PO TB24: 30.0000 mg | ORAL_TABLET | Freq: Every day | ORAL | 3 refills | Status: DC

## 2017-03-14 NOTE — Patient Instructions (Addendum)
We will be checking the following labs today - NONE   Medication Instructions:    Continue with your current medicines. BUT  I am adding Imdur 30 mg to take once a dose.   I would consider taking the medicine that Dr. Virgina Jock gave you for depression.     Testing/Procedures To Be Arranged:  N/A  Follow-Up:   See me in 1 month   Other Special Instructions:   N/A    If you need a refill on your cardiac medications before your next appointment, please call your pharmacy.   Call the [place] office at [phone] if you have any questions, problems or concerns.

## 2017-03-23 ENCOUNTER — Encounter: Payer: Self-pay | Admitting: Nurse Practitioner

## 2017-04-09 ENCOUNTER — Telehealth: Payer: Self-pay | Admitting: Nurse Practitioner

## 2017-04-09 NOTE — Telephone Encounter (Signed)
04/09/2017 10:45 Writer spoke with Truitt Merle who stated it was ok for patient to stop Imdur and return to office as scheduled. Georgana Curio MHA RN CCM

## 2017-04-09 NOTE — Telephone Encounter (Signed)
New Message    Pt c/o medication issue:  1. Name of Medication:  isosorbide mononitrate (IMDUR) 30 MG 24 hr tablet Take 1 tablet (30 mg total) by mouth daily     2. How are you currently taking this medication (dosage and times per day)? Stopped taking it   3. Are you having a reaction (difficulty breathing--STAT)? Severe headache  4. What is your medication issue? Pt stopped taking the medication it gave her severe headache and make her very doped up , that she was staggering and could not get up out of bed and depression

## 2017-04-11 ENCOUNTER — Ambulatory Visit: Payer: Medicare Other | Admitting: Nurse Practitioner

## 2017-04-17 NOTE — Progress Notes (Signed)
CARDIOLOGY OFFICE NOTE  Date:  04/18/2017    Tanya Ho Date of Birth: 1926/10/10 Medical Record #[id number]  PCP:  Tanya Reel, MD  Cardiologist:  [facility]are [facility]    Chief Complaint  Patient presents with  . Coronary Artery Disease    One month check - seen for Dr. Acie Fredrickson    History of Present Illness: Tanya Ho is a 83 y.o. female who presents today for a follow up visit. Seen for Dr. Acie Fredrickson. Former patient of Dr. Susa Simmonds.   Has known mild/moderate CAD (maintained on chronic Plavix) from cardiac cath back in 2007, HTN, HLD, palpitations, obesity, OA, hiatal hernia, GERD. She has her labs checked by Dr. Virgina Jock.   I saw her back in June of 2016. Her husband had just passed away earlier that month. I saw her back shortly thereafter. She has had issues with grief/coping. Last seen by me back in March and she was doing ok. Less grief. Was getting more forgetful. Has remained pretty depressed.   I saw her back towards the end of this past March - lots of symptoms - sounded like she was having more chest pain - tried to add Imdur - this was not tolerated due to headache. I saw her daughter in the interim who told me that she thought polypharmacy was playing a big issue with her mom's care. She had taken some Advil PM in the daytime on the day I last saw her.   Comes back today. Here alone today. She says she remains anxious. Weight is up. Did not tolerate the Imdur. Had headache and did not feel well while taking - thought she might have gotten dizzy. Only tried for 2 days.  Still has periodic chest pain. Still has some indigestion. She is on both Protonix and Zantac. Diet is bad. Does have the head of her bed elevated. Did use some NTG a few nights ago but this "made me burn all over" and then she got anxious. Overall, she feels like she is doing ok - says she is "making it". She is asking about trying 1/2 dose of the Imdur.   Past Medical History:  Diagnosis Date  . Back  pain   . Bruises easily   . Coronary artery disease    NONOBSTRUCTIVE  . Fatigue   . Hyperlipidemia   . Hypertension   . Knee pain   . OA (osteoarthritis)   . Obesity   . Palpitations   . SOB (shortness of breath)     Past Surgical History:  Procedure Laterality Date  . BREAST BIOPSY     LEFT BREAST  . CARDIAC CATHETERIZATION  05/11/2006   OVERALL CARDIAC SIZE AND SILHOUETTE ARE NORMAL. EF ESTIMATED 60%  . KIDNEY STONES  1982  . KNEE ARTHROSCOPY  09/22/2008  . VAGINAL HYSTERECTOMY       Medications: Current Outpatient Prescriptions  Medication Sig Dispense Refill  . ALPRAZolam (XANAX) 0.5 MG tablet Take 1 mg by mouth at bedtime.     . clopidogrel (PLAVIX) 75 MG tablet TAKE 1 TABLET BY MOUTH ONCE A DAY 30 tablet 6  . ergocalciferol (VITAMIN D2) 50000 UNITS capsule Take 50,000 Units by mouth once a week.      . Ibuprofen (ADVIL PO) Take 400 mg by mouth as needed.     . metoprolol (LOPRESSOR) 100 MG tablet Take 1 tablet (100 mg total) by mouth 2 (two) times daily. 180 tablet 3  . nitroGLYCERIN (NITROSTAT) 0.4  MG SL tablet Place 1 tablet (0.4 mg total) under the tongue every 5 (five) minutes as needed for chest pain. 100 tablet 3  . pantoprazole (PROTONIX) 40 MG tablet Take 40 mg by mouth daily.     . ranitidine (ZANTAC) 300 MG tablet Take 300 mg by mouth at bedtime.     . simvastatin (ZOCOR) 20 MG tablet Take 20 mg by mouth every evening.    . traMADol (ULTRAM) 50 MG tablet TAKE 1-2 TABLETS BY MOUTH EVERY 6-8 HOURS AS NEEDED FOR PAIN  0  . triamterene-hydrochlorothiazide (DYAZIDE) 37.5-25 MG per capsule Take 1 capsule by mouth every morning.      . isosorbide mononitrate (IMDUR) 30 MG 24 hr tablet Take 0.5 tablets (15 mg total) by mouth daily. 90 tablet 3   No current facility-administered medications for this visit.     Allergies: Allergies  Allergen Reactions  . Codeine   . Demerol   . Morphine And Related     Social History: The patient  reports that she has never  smoked. She has never used smokeless tobacco. She reports that she does not drink alcohol or use drugs.   Family History: The patient's family history includes Heart attack in her father; Heart disease in her mother; Stroke in her mother.   Review of Systems: Please see the history of present illness.   Otherwise, the review of systems is positive for none.   All other systems are reviewed and negative.   Physical Exam: VS:  BP (!) 150/80 (BP Location: Left Arm, Patient Position: Sitting, Cuff Size: Large)   Pulse 75   Ht 5\' 7"  (1.702 m)   Wt 193 lb 6.4 oz (87.7 kg)   SpO2 95% Comment: at rest  BMI 30.29 kg/m  .  BMI Body mass index is 30.29 kg/m.  Wt Readings from Last 3 Encounters:  04/18/17 193 lb 6.4 oz (87.7 kg)  03/14/17 188 lb 1.9 oz (85.3 kg)  08/23/16 186 lb 6.4 oz (84.6 kg)    General: Pleasant. Well developed, well nourished and in no acute distress.   HEENT: Normal.  Neck: Supple, no JVD, carotid bruits, or masses noted.  Cardiac: Regular rate and rhythm. No murmurs, rubs, or gallops. No edema.  Respiratory:  Lungs are clear to auscultation bilaterally with normal work of breathing.  GI: Soft and nontender.  MS: No deformity or atrophy. Gait and ROM intact.  Skin: Warm and dry. Color is normal.  Neuro:  Strength and sensation are intact and no gross focal deficits noted.  Psych: Alert, appropriate and with normal affect.   LABORATORY DATA:  EKG:  EKG is not ordered today.  Lab Results  Component Value Date   HGB 13.3 POINT OF CARE RESULT 09/22/2008   GLUCOSE 134 (H) 09/18/2008   NA 138 09/18/2008   K 4.3 09/24/2014   CL 104 09/18/2008   CREATININE 0.90 09/18/2008   BUN 11 09/18/2008   CO2 28 09/18/2008    BNP (last 3 results) No results for input(s): BNP in the last 8760 hours.  ProBNP (last 3 results) No results for input(s): PROBNP in the last 8760 hours.   Other Studies Reviewed Today:   Assessment/Plan:  1. CAD - Managed medically. She  has not tolerated Imdur - but willing to try 1/2 dose. She will use NTG prn - reminded that if she was to ever use 3 NTG she is advised to call EMS. I think our overall options are limited. Try conservative  management.   2. HTN - BP is fair. No change in therapy   3. Situational stress/grief - [name] has been dead now almost 2 years. She still struggles. This probably drives a lot of her symptoms.  I don't have an issue with her trying Trazadone but there is a concern for polypharmacy. This was not discussed today.   4. OA     Current medicines are reviewed with the patient today.  The patient does not have concerns regarding medicines other than what has been noted above.  Current medicines are reviewed with the patient today.  The patient does not have concerns regarding medicines other than what has been noted above.  The following changes have been made:  See above.  Labs/ tests ordered today include:   No orders of the defined types were placed in this encounter.    Disposition:   FU with me in 3 to 4 months.   Patient is agreeable to this plan and will call if any problems develop in the interim.   SignedTruitt Merle, NP  04/18/2017 2:01 PM  [facility] [address] [place], [place]  [address] Phone: [phone] Fax: [phone]

## 2017-04-18 ENCOUNTER — Encounter: Payer: Self-pay | Admitting: Nurse Practitioner

## 2017-04-18 ENCOUNTER — Ambulatory Visit (INDEPENDENT_AMBULATORY_CARE_PROVIDER_SITE_OTHER): Payer: Medicare Other | Admitting: Nurse Practitioner

## 2017-04-18 ENCOUNTER — Encounter (INDEPENDENT_AMBULATORY_CARE_PROVIDER_SITE_OTHER): Payer: Self-pay

## 2017-04-18 VITALS — BP 150/80 | HR 75 | Ht 67.0 in | Wt 193.4 lb

## 2017-04-18 DIAGNOSIS — I2510 Atherosclerotic heart disease of native coronary artery without angina pectoris: Secondary | ICD-10-CM | POA: Diagnosis not present

## 2017-04-18 DIAGNOSIS — F4321 Adjustment disorder with depressed mood: Secondary | ICD-10-CM

## 2017-04-18 DIAGNOSIS — F4320 Adjustment disorder, unspecified: Secondary | ICD-10-CM | POA: Diagnosis not present

## 2017-04-18 DIAGNOSIS — I10 Essential (primary) hypertension: Secondary | ICD-10-CM

## 2017-04-18 MED ORDER — ISOSORBIDE MONONITRATE ER 30 MG PO TB24: 15.0000 mg | ORAL_TABLET | Freq: Every day | ORAL | 3 refills | Status: DC

## 2017-04-18 NOTE — Patient Instructions (Addendum)
We will be checking the following labs today - NONE   Medication Instructions:    Continue with your current medicines.   Ok to try 1/2 Imdur in the morning     Testing/Procedures To Be Arranged:  N/A  Follow-Up:   See me in 3 to 4 months    Other Special Instructions:   N/A    If you need a refill on your cardiac medications before your next appointment, please call your pharmacy.   Call the [place] office at [phone] if you have any questions, problems or concerns.

## 2017-04-25 DIAGNOSIS — R600 Localized edema: Secondary | ICD-10-CM | POA: Diagnosis not present

## 2017-04-25 DIAGNOSIS — R339 Retention of urine, unspecified: Secondary | ICD-10-CM | POA: Diagnosis not present

## 2017-04-25 DIAGNOSIS — N390 Urinary tract infection, site not specified: Secondary | ICD-10-CM | POA: Diagnosis not present

## 2017-04-25 DIAGNOSIS — Z6831 Body mass index (BMI) 31.0-31.9, adult: Secondary | ICD-10-CM | POA: Diagnosis not present

## 2017-04-25 DIAGNOSIS — R8290 Unspecified abnormal findings in urine: Secondary | ICD-10-CM | POA: Diagnosis not present

## 2017-04-25 DIAGNOSIS — R5383 Other fatigue: Secondary | ICD-10-CM | POA: Diagnosis not present

## 2017-05-03 DIAGNOSIS — M1712 Unilateral primary osteoarthritis, left knee: Secondary | ICD-10-CM | POA: Diagnosis not present

## 2017-05-03 DIAGNOSIS — M1711 Unilateral primary osteoarthritis, right knee: Secondary | ICD-10-CM | POA: Diagnosis not present

## 2017-05-03 DIAGNOSIS — M170 Bilateral primary osteoarthritis of knee: Secondary | ICD-10-CM | POA: Diagnosis not present

## 2017-08-08 ENCOUNTER — Encounter: Payer: Self-pay | Admitting: Nurse Practitioner

## 2017-08-29 ENCOUNTER — Encounter: Payer: Self-pay | Admitting: Nurse Practitioner

## 2017-08-29 ENCOUNTER — Ambulatory Visit (INDEPENDENT_AMBULATORY_CARE_PROVIDER_SITE_OTHER): Payer: Medicare Other | Admitting: Nurse Practitioner

## 2017-08-29 VITALS — BP 130/80 | HR 89 | Ht 66.0 in | Wt 194.1 lb

## 2017-08-29 DIAGNOSIS — F4321 Adjustment disorder with depressed mood: Secondary | ICD-10-CM

## 2017-08-29 DIAGNOSIS — I2510 Atherosclerotic heart disease of native coronary artery without angina pectoris: Secondary | ICD-10-CM | POA: Diagnosis not present

## 2017-08-29 DIAGNOSIS — F4320 Adjustment disorder, unspecified: Secondary | ICD-10-CM

## 2017-08-29 DIAGNOSIS — I10 Essential (primary) hypertension: Secondary | ICD-10-CM | POA: Diagnosis not present

## 2017-08-29 NOTE — Progress Notes (Signed)
CARDIOLOGY OFFICE NOTE  Date:  08/29/2017    Tanya Ho Date of Birth: 01-27-1926 Medical Record #[id number]  PCP:  Shon Baton, MD  Cardiologist:  Servando Snare Nahser    Chief Complaint  Patient presents with  . Coronary Artery Disease    4 month check - seen for Dr. Acie Fredrickson    History of Present Illness: Tanya Ho is a 83 y.o. female who presents today for a 4 month check. Seen for Dr. Acie Fredrickson. Former patient of Dr. Susa Simmonds.   Has known mild/moderate CAD (maintained on chronic Plavix) from cardiac cath back in 2007, HTN, HLD, palpitations, obesity, OA, hiatal hernia, GERD. She has her labs checked by Dr. Virgina Jock.   I saw her back in June of 2016. Her husband had just passed away earlier that month. I saw her back shortly thereafter. She has had issues with grief/coping. Last seen by me back in March and she was doing ok. Less grief. Was getting more forgetful. Has remained pretty depressed.   I saw her back towards the end of this past March - lots of symptoms - sounded like she was having more chest pain - tried to add Imdur - this was not tolerated due to headache. I saw her daughter in the interim who told me that she thought polypharmacy was playing a big issue with her mom's care. She had taken some Advil PM in the daytime on the day I last saw her. Last seen by me back in May - remained anxious in a general fashion. Did not tolerate Imdur but was willing to try 1/2 dose. Still with some periodic chest pain - our options felt to be limited.   Comes back today. Here alone today. She says she is doing ok. Still anxious but better than she has been. Still will have a night or so - heart "doesn't beat right" and BP up.  Only had maybe 2 NTG since last visit here. Not able to tolerate the Imdur again. Having her physical with Dr. Virgina Jock later this month.   Past Medical History:  Diagnosis Date  . Back pain   . Bruises easily   . Coronary artery disease    NONOBSTRUCTIVE    . Fatigue   . Hyperlipidemia   . Hypertension   . Knee pain   . OA (osteoarthritis)   . Obesity   . Palpitations   . SOB (shortness of breath)     Past Surgical History:  Procedure Laterality Date  . BREAST BIOPSY     LEFT BREAST  . CARDIAC CATHETERIZATION  05/11/2006   OVERALL CARDIAC SIZE AND SILHOUETTE ARE NORMAL. EF ESTIMATED 60%  . KIDNEY STONES  1982  . KNEE ARTHROSCOPY  09/22/2008  . VAGINAL HYSTERECTOMY       Medications: Current Meds  Medication Sig  . ALPRAZolam (XANAX) 0.5 MG tablet Take 1 mg by mouth at bedtime.   . clopidogrel (PLAVIX) 75 MG tablet TAKE 1 TABLET BY MOUTH ONCE A DAY  . ergocalciferol (VITAMIN D2) 50000 UNITS capsule Take 50,000 Units by mouth once a week.    . Ibuprofen (ADVIL PO) Take 400 mg by mouth as needed.   . metoprolol (LOPRESSOR) 100 MG tablet Take 1 tablet (100 mg total) by mouth 2 (two) times daily.  . nitroGLYCERIN (NITROSTAT) 0.4 MG SL tablet Place 1 tablet (0.4 mg total) under the tongue every 5 (five) minutes as needed for chest pain.  . pantoprazole (PROTONIX) 40 MG  tablet Take 40 mg by mouth daily.   . ranitidine (ZANTAC) 300 MG tablet Take 300 mg by mouth at bedtime.   . simvastatin (ZOCOR) 20 MG tablet Take 20 mg by mouth every evening.  . traMADol (ULTRAM) 50 MG tablet TAKE 1-2 TABLETS BY MOUTH EVERY 6-8 HOURS AS NEEDED FOR PAIN  . triamterene-hydrochlorothiazide (DYAZIDE) 37.5-25 MG per capsule Take 1 capsule by mouth every morning.       Allergies: Allergies  Allergen Reactions  . Codeine   . Demerol   . Morphine And Related     Social History: The patient  reports that she has never smoked. She has never used smokeless tobacco. She reports that she does not drink alcohol or use drugs.   Family History: The patient's family history includes Heart attack in her father; Heart disease in her mother; Stroke in her mother.   Review of Systems: Please see the history of present illness.   Otherwise, the review of  systems is positive for none.   All other systems are reviewed and negative.   Physical Exam: VS:  BP 130/80   Pulse 89   Ht 5\' 6"  (1.676 m)   Wt 194 lb 1.9 oz (88.1 kg)   BMI 31.33 kg/m  .  BMI Body mass index is 31.33 kg/m.  Wt Readings from Last 3 Encounters:  08/29/17 194 lb 1.9 oz (88.1 kg)  04/18/17 193 lb 6.4 oz (87.7 kg)  03/14/17 188 lb 1.9 oz (85.3 kg)    General: Pleasant. Elderly lady - looks younger than her stated age. She is alert and in no acute distress.   HEENT: Normal.  Neck: Supple, no JVD, carotid bruits, or masses noted.  Cardiac: Regular rate and rhythm. No murmurs, rubs, or gallops. No edema.  Respiratory:  Lungs are clear to auscultation bilaterally with normal work of breathing.  GI: Soft and nontender.  MS: No deformity or atrophy. Gait and ROM intact.  Skin: Warm and dry. Color is normal.  Neuro:  Strength and sensation are intact and no gross focal deficits noted.  Psych: Alert, appropriate and with normal affect.   LABORATORY DATA:  EKG:  EKG is not ordered today.  Lab Results  Component Value Date   HGB 13.3 POINT OF CARE RESULT 09/22/2008   GLUCOSE 134 (H) 09/18/2008   NA 138 09/18/2008   K 4.3 09/24/2014   CL 104 09/18/2008   CREATININE 0.90 09/18/2008   BUN 11 09/18/2008   CO2 28 09/18/2008     BNP (last 3 results) No results for input(s): BNP in the last 8760 hours.  ProBNP (last 3 results) No results for input(s): PROBNP in the last 8760 hours.   Other Studies Reviewed Today:   Assessment/Plan: 1. CAD - Managed medically. She has not tolerated Imdur - she does use NTG on a rare occasion. I think overall this is acceptable. Would favor more of a conservative management and her options are limited.   2. HTN - BP is ok here today. No change in therapy   3. Situational stress/grief - [name] has been dead now over 2 years. She still struggles. This probably drives a lot of her symptoms. Still worry about concern for  polypharmacy. This was not discussed today.   4. OA - seems to be her most limiting factor.    Current medicines are reviewed with the patient today.  The patient does not have concerns regarding medicines other than what has been noted above.  The  following changes have been made:  See above.  Labs/ tests ordered today include:   No orders of the defined types were placed in this encounter.    Disposition:   FU with me in 6 months.   Patient is agreeable to this plan and will call if any problems develop in the interim.   SignedTruitt Merle, NP  08/29/2017 2:30 PM  [facility] [address] [place],   [address] Phone: [phone] Fax: [phone]

## 2017-08-29 NOTE — Patient Instructions (Addendum)
We will be checking the following labs today - NONE   Medication Instructions:    Continue with your current medicines.     Testing/Procedures To Be Arranged:  N/A  Follow-Up:   See me in 6 months.    Other Special Instructions:   N/A    If you need a refill on your cardiac medications before your next appointment, please call your pharmacy.   Call the [facility] office at [phone] if you have any questions, problems or concerns.      

## 2017-09-17 DIAGNOSIS — E7849 Other hyperlipidemia: Secondary | ICD-10-CM | POA: Diagnosis not present

## 2017-09-17 DIAGNOSIS — R8271 Bacteriuria: Secondary | ICD-10-CM | POA: Diagnosis not present

## 2017-09-17 DIAGNOSIS — R7302 Impaired glucose tolerance (oral): Secondary | ICD-10-CM | POA: Diagnosis not present

## 2017-09-17 DIAGNOSIS — I10 Essential (primary) hypertension: Secondary | ICD-10-CM | POA: Diagnosis not present

## 2017-09-17 DIAGNOSIS — E559 Vitamin D deficiency, unspecified: Secondary | ICD-10-CM | POA: Diagnosis not present

## 2017-09-21 DIAGNOSIS — Z23 Encounter for immunization: Secondary | ICD-10-CM | POA: Diagnosis not present

## 2017-11-12 DIAGNOSIS — D485 Neoplasm of uncertain behavior of skin: Secondary | ICD-10-CM | POA: Diagnosis not present

## 2017-11-12 DIAGNOSIS — L570 Actinic keratosis: Secondary | ICD-10-CM | POA: Diagnosis not present

## 2017-11-12 DIAGNOSIS — Z23 Encounter for immunization: Secondary | ICD-10-CM | POA: Diagnosis not present

## 2017-11-12 DIAGNOSIS — L821 Other seborrheic keratosis: Secondary | ICD-10-CM | POA: Diagnosis not present

## 2017-12-07 DIAGNOSIS — H353131 Nonexudative age-related macular degeneration, bilateral, early dry stage: Secondary | ICD-10-CM | POA: Diagnosis not present

## 2018-01-08 DIAGNOSIS — M170 Bilateral primary osteoarthritis of knee: Secondary | ICD-10-CM | POA: Diagnosis not present

## 2018-02-21 DIAGNOSIS — L821 Other seborrheic keratosis: Secondary | ICD-10-CM | POA: Diagnosis not present

## 2018-02-21 DIAGNOSIS — L570 Actinic keratosis: Secondary | ICD-10-CM | POA: Diagnosis not present

## 2018-02-21 DIAGNOSIS — Z23 Encounter for immunization: Secondary | ICD-10-CM | POA: Diagnosis not present

## 2018-02-26 ENCOUNTER — Ambulatory Visit: Payer: Medicare Other | Admitting: Nurse Practitioner

## 2018-02-26 DIAGNOSIS — R0989 Other specified symptoms and signs involving the circulatory and respiratory systems: Secondary | ICD-10-CM

## 2018-02-26 NOTE — Progress Notes (Deleted)
CARDIOLOGY OFFICE NOTE  Date:  02/26/2018    Tanya Ho Date of Birth: March 22, 1926 Medical Record #[id number]  PCP:  Shon Baton, MD  Cardiologist:  Servando Snare & Nahser    No chief complaint on file.   History of Present Illness: Tanya Ho is a 84 y.o. female who presents today for a 6 month check. Seen for Dr. Acie Fredrickson. Former patient of Dr. Susa Simmonds.   Has known mild/moderate CAD (maintained on chronic Plavix) from cardiac cath back in 03/25/2006, HTN, HLD, palpitations, obesity, OA, hiatal hernia, GERD. She has her labs checked by Dr. Virgina Jock. She is chronically anxious.   I have seen her over the past several years. Her husband died in 2015-03-26 - she has had a really hard time coping with this and depression/grief are very significant. Back in March 25, 2017 - lots of symptoms - sounded like chest pain - tried to add Imdur - not tolerated. I saw her daughter in the interim who shared with me that she thought polypharmacy was playing a big role. Our options for her chest pain felt to be limited. She was not interested in further testing/evaluation.  Comes back today. Here alone today.   Past Medical History:  Diagnosis Date  . Back pain   . Bruises easily   . Coronary artery disease    NONOBSTRUCTIVE  . Fatigue   . Hyperlipidemia   . Hypertension   . Knee pain   . OA (osteoarthritis)   . Obesity   . Palpitations   . SOB (shortness of breath)     Past Surgical History:  Procedure Laterality Date  . BREAST BIOPSY     LEFT BREAST  . CARDIAC CATHETERIZATION  05/11/2006   OVERALL CARDIAC SIZE AND SILHOUETTE ARE NORMAL. EF ESTIMATED 60%  . KIDNEY STONES  1982  . KNEE ARTHROSCOPY  09/22/2008  . VAGINAL HYSTERECTOMY       Medications: No outpatient medications have been marked as taking for the 02/26/18 encounter (Appointment) with Burtis Junes, NP.     Allergies: Allergies  Allergen Reactions  . Codeine   . Demerol   . Morphine And Related     Social  History: The patient  reports that  has never smoked. she has never used smokeless tobacco. She reports that she does not drink alcohol or use drugs.   Family History: The patient's family history includes Heart attack in her father; Heart disease in her mother; Stroke in her mother.   Review of Systems: Please see the history of present illness.   Otherwise, the review of systems is positive for none.   All other systems are reviewed and negative.   Physical Exam: VS:  There were no vitals taken for this visit. Marland Kitchen  BMI There is no height or weight on file to calculate BMI.  Wt Readings from Last 3 Encounters:  08/29/17 194 lb 1.9 oz (88.1 kg)  04/18/17 193 lb 6.4 oz (87.7 kg)  03/14/17 188 lb 1.9 oz (85.3 kg)    General: Pleasant. Well developed, well nourished and in no acute distress.   HEENT: Normal.  Neck: Supple, no JVD, carotid bruits, or masses noted.  Cardiac: Regular rate and rhythm. No murmurs, rubs, or gallops. No edema.  Respiratory:  Lungs are clear to auscultation bilaterally with normal work of breathing.  GI: Soft and nontender.  MS: No deformity or atrophy. Gait and ROM intact.  Skin: Warm and dry. Color is normal.  Neuro:  Strength and sensation are intact and no gross focal deficits noted.  Psych: Alert, appropriate and with normal affect.   LABORATORY DATA:  EKG:  EKG is ordered today. This demonstrates .  Lab Results  Component Value Date   HGB 13.3 POINT OF CARE RESULT 09/22/2008   GLUCOSE 134 (H) 09/18/2008   NA 138 09/18/2008   K 4.3 09/24/2014   CL 104 09/18/2008   CREATININE 0.90 09/18/2008   BUN 11 09/18/2008   CO2 28 09/18/2008     BNP (last 3 results) No results for input(s): BNP in the last 8760 hours.  ProBNP (last 3 results) No results for input(s): PROBNP in the last 8760 hours.   Other Studies Reviewed Today:   Assessment/Plan:  1. CAD - Managed medically. She has not tolerated Imdur - she does use NTG on a rare occasion. I  think overall this is acceptable. Would favor more of a conservative management and her options are limited.   2. HTN - BP is ok here today. No change in therapy   3. Situational stress/grief - [name] has been dead now over 2 years. She still struggles. This probably drives a lot of her symptoms. Still worry about concern for polypharmacy. This was not discussed today.   4. OA - seems to be her most limiting factor.   Current medicines are reviewed with the patient today.  The patient does not have concerns regarding medicines other than what has been noted above.  The following changes have been made:  See above.  Labs/ tests ordered today include:   No orders of the defined types were placed in this encounter.    Disposition:   FU with me in 6 months.   Patient is agreeable to this plan and will call if any problems develop in the interim.   SignedTruitt Merle, NP  02/26/2018 7:47 AM  [facility] [address] [place], [place]  [address] Phone: [phone] Fax: [phone]

## 2018-02-27 ENCOUNTER — Encounter: Payer: Self-pay | Admitting: Nurse Practitioner

## 2018-02-27 ENCOUNTER — Telehealth: Payer: Self-pay | Admitting: *Deleted

## 2018-02-27 NOTE — Telephone Encounter (Signed)
S/w pt, pt missed appt yesterday and [name] wanted to make sure pt was ok.  Pt did call in office today and t/w Jeanann Lewandowsky, RMA, and made appt. Switched pt's appt due to being to early.  Pt is having HR issues.  Sent message to [name] to [place].

## 2018-03-04 ENCOUNTER — Encounter: Payer: Self-pay | Admitting: Nurse Practitioner

## 2018-03-04 ENCOUNTER — Ambulatory Visit (INDEPENDENT_AMBULATORY_CARE_PROVIDER_SITE_OTHER): Payer: Medicare Other | Admitting: Nurse Practitioner

## 2018-03-04 ENCOUNTER — Ambulatory Visit: Payer: Medicare Other | Admitting: Nurse Practitioner

## 2018-03-04 VITALS — BP 130/80 | HR 65 | Ht 66.0 in | Wt 191.8 lb

## 2018-03-04 DIAGNOSIS — I2510 Atherosclerotic heart disease of native coronary artery without angina pectoris: Secondary | ICD-10-CM | POA: Diagnosis not present

## 2018-03-04 DIAGNOSIS — I480 Paroxysmal atrial fibrillation: Secondary | ICD-10-CM

## 2018-03-04 DIAGNOSIS — I10 Essential (primary) hypertension: Secondary | ICD-10-CM | POA: Diagnosis not present

## 2018-03-04 MED ORDER — APIXABAN 5 MG PO TABS: 5.0000 mg | ORAL_TABLET | Freq: Two times a day (BID) | ORAL | 6 refills | Status: DC

## 2018-03-04 NOTE — Progress Notes (Signed)
CARDIOLOGY OFFICE NOTE  Date:  03/04/2018    Tanya Ho Date of Birth: 11/10/1926 Medical Record #[id number]  PCP:  Tanya Baton, MD  Cardiologist:  Tanya Ho    Chief Complaint  Patient presents with  . Coronary Artery Disease  . Hypertension  . Hyperlipidemia    Follow up visit - seen for Dr. Acie Ho    History of Present Illness: Tanya Ho is a 84 y.o. female who presents today for a follow up visit. Seen for Dr. Acie Ho. Former patient of Dr. Susa Ho.   Has known mild/moderate CAD (maintained on chronic Plavix) from cardiac cath back in 2007, HTN, HLD, palpitations, obesity, OA, hiatal hernia, GERD. She has her labs checked by Dr. Virgina Ho.   I saw her back in June of 2016. Her husband had just passed away earlier that month. I saw her back shortly thereafter. She has had issues with grief/coping. Last seen by me back in March and she was doing ok. Less grief. Was getting more forgetful. Has remained pretty depressed.   I saw her back towards the end of this pastMarch - lots of symptoms - sounded like she was having more chest pain - tried to add Imdur - this was not tolerated due to headache. I saw her daughter in the interim who told me that she thought polypharmacy was playing a big issue with her mom's care. She had taken some Advil PM in the daytime on the day I last saw her. Last seen by me back in May - remained anxious in a general fashion. Did not tolerate Imdur but was willing to try 1/2 dose. Still with some periodic chest pain - our options felt to be limited.   Comes back today. Here alone today. She says she is doing ok. Still anxious but better than she has been. Still will have a night or so - heart "doesn't beat right" and BP up.  Only had maybe 2 NTG since last visit here. Not able to tolerate the Imdur again. Having her physical with Dr. Virgina Ho later this month.   Comes in today. Here alone. Tanya Ho is in the waiting room. She says she is doing  ok but her heart was acting up "on the right side" yesterday. She tried to take a nap yesterday but noted the discomfort - then moved over more to the middle of her chest. She took NTG with no relief - she is not sure if that was an old tablet - she then took one of her new one's - that seemed to help but she is still not sure. Ended up taking her nighttime medicines and tried to go on to bed. Poor sleeper. Worse since Tanya Ho died - she stays scared and frightened "all night long". Typically has not been using NTG. Then tells me that she had several days where she felt "uncomfortable" and could not get a deep breath. This "un nerved me". She tried taking deep breaths and that helped but she did not feel well for a few days. BP was up during this time.   Admits her memory is an issue - "can't think as well". She admits she gets scared and this just makes the situation worse. She has never had to use 3 NTG. She did not tolerate Imdur. She has been less active with the winter. Not getting out much. Still limited by back pain.   She notes that after the EKG - she endorses palpitations.  Past Medical History:  Diagnosis Date  . Back pain   . Bruises easily   . Coronary artery disease    NONOBSTRUCTIVE  . Fatigue   . Hyperlipidemia   . Hypertension   . Knee pain   . OA (osteoarthritis)   . Obesity   . Palpitations   . SOB (shortness of breath)     Past Surgical History:  Procedure Laterality Date  . BREAST BIOPSY     LEFT BREAST  . CARDIAC CATHETERIZATION  05/11/2006   OVERALL CARDIAC SIZE AND SILHOUETTE ARE NORMAL. EF ESTIMATED 60%  . KIDNEY STONES  1982  . KNEE ARTHROSCOPY  09/22/2008  . VAGINAL HYSTERECTOMY       Medications: Current Meds  Medication Sig  . ALPRAZolam (XANAX) 0.5 MG tablet Take 1 mg by mouth at bedtime.   . ergocalciferol (VITAMIN D2) 50000 UNITS capsule Take 50,000 Units by mouth once a week.    . Ibuprofen (ADVIL PO) Take 400 mg by mouth as needed.   . metoprolol  (LOPRESSOR) 100 MG tablet Take 1 tablet (100 mg total) by mouth 2 (two) times daily.  . nitroGLYCERIN (NITROSTAT) 0.4 MG SL tablet Place 1 tablet (0.4 mg total) under the tongue every 5 (five) minutes as needed for chest pain.  . pantoprazole (PROTONIX) 40 MG tablet Take 40 mg by mouth daily.   . ranitidine (ZANTAC) 300 MG tablet Take 300 mg by mouth at bedtime.   . simvastatin (ZOCOR) 20 MG tablet Take 20 mg by mouth every evening.  . traMADol (ULTRAM) 50 MG tablet TAKE 1-2 TABLETS BY MOUTH EVERY 6-8 HOURS AS NEEDED FOR PAIN  . triamterene-hydrochlorothiazide (DYAZIDE) 37.5-25 MG per capsule Take 1 capsule by mouth every morning.    . [DISCONTINUED] clopidogrel (PLAVIX) 75 MG tablet TAKE 1 TABLET BY MOUTH ONCE A DAY     Allergies: Allergies  Allergen Reactions  . Codeine   . Demerol   . Morphine And Related     Social History: The patient  reports that  has never smoked. she has never used smokeless tobacco. She reports that she does not drink alcohol or use drugs.   Family History: The patient's family history includes Heart attack in her father; Heart disease in her mother; Stroke in her mother.   Review of Systems: Please see the history of present illness.   Otherwise, the review of systems is positive for none.   All other systems are reviewed and negative.   Physical Exam: VS:  BP 130/80 (BP Location: Left Arm, Patient Position: Sitting, Cuff Size: Normal)   Pulse 65   Ht 5\' 6"  (1.676 m)   Wt 191 lb 12.8 oz (87 kg)   SpO2 97% Comment: at rest  BMI 30.96 kg/m  .  BMI Body mass index is 30.96 kg/m.  Wt Readings from Last 3 Encounters:  03/04/18 191 lb 12.8 oz (87 kg)  08/29/17 194 lb 1.9 oz (88.1 kg)  04/18/17 193 lb 6.4 oz (87.7 kg)    General: Pleasant. Elderly female. She looks younger than her stated age. She is alert and in no acute distress.   HEENT: Normal.  Neck: Supple, no JVD, carotid bruits, or masses noted.  Cardiac: Somewhat irregular rhythm. Her rate  is fine.  No murmurs, rubs, or gallops. No edema.  Respiratory:  Lungs are clear to auscultation bilaterally with normal work of breathing.  GI: Soft and nontender.  MS: No deformity or atrophy. Gait and ROM intact.  Skin: Warm  and dry. Color is normal.  Neuro:  Strength and sensation are intact and no gross focal deficits noted.  Psych: Alert, appropriate and with normal affect.   LABORATORY DATA:  EKG:  EKG is ordered today. This demonstrates new onset AF with controlled VR - reviewed with Dr. Saunders Revel.   Lab Results  Component Value Date   HGB 13.3 POINT OF CARE RESULT 09/22/2008   GLUCOSE 134 (H) 09/18/2008   NA 138 09/18/2008   K 4.3 09/24/2014   CL 104 09/18/2008   CREATININE 0.90 09/18/2008   BUN 11 09/18/2008   CO2 28 09/18/2008     BNP (last 3 results) No results for input(s): BNP in the last 8760 hours.  ProBNP (last 3 results) No results for input(s): PROBNP in the last 8760 hours.   Other Studies Reviewed Today:   Assessment/Plan:  1. CAD - Managed medically. She has not tolerated Imdur - she does use NTG on occasion. It is hard to get an accurate picture on how she is doing. I think her anxiety is a big driving factor. She does not wish to have further testing and has opted for a conservative approach. We have agreed that in the event she ever had to take 3 NTG without relief, then to call EMS.   2. HTN - BP is ok here today. Her readings from home are basically ok. No changes made today.   3. Situational stress/grief - Tanya Ho has been dead almost 3 years. She still struggles. This probably drives a lot of her symptoms. Still worry about concern for polypharmacy. I think this is still the biggest factor. I spoke to her daughter about this as well - she agrees.   4. OA - chronic issue.  5. New onset AF - hard to say how symptomatic she is - discussed with Dr. Saunders Revel - will stop Plavix, start Eliquis 5 mg BID. Echo to be obtained. Lab today to include TSH. She is  not interested in cardioversion but will address on return.   Current medicines are reviewed with the patient today.  The patient does not have concerns regarding medicines other than what has been noted above.  The following changes have been made:  See above.  Labs/ tests ordered today include:    Orders Placed This Encounter  Procedures  . Basic metabolic panel  . CBC  . Hepatic function panel  . Lipid panel  . TSH  . EKG 12-Lead  . ECHOCARDIOGRAM COMPLETE     Disposition:   FU with me in 4 weeks.  Patient is agreeable to this plan and will call if any problems develop in the interim.   SignedTruitt Merle, NP  03/04/2018 3:17 PM  [facility] [address] [place], [place]  [address] Phone: [phone] Fax: [phone]

## 2018-03-04 NOTE — Patient Instructions (Addendum)
We will be checking the following labs today - BMET, CBC, HPF, TSH and lipids   Medication Instructions:    Continue with your current medicines. BUT  I am stopping the Plavix  I am adding Eliquis 5 mg to take twice a day - I've sent this to the pharmacy    Testing/Procedures To Be Arranged:  Echocardiogram  Follow-Up:   See me in 1 months   Other Special Instructions:   If you were to need 3 NTG and still did not have relief - then call EMS    If you need a refill on your cardiac medications before your next appointment, please call your pharmacy.     Apixaban oral tablets What is this medicine? APIXABAN (a PIX a ban) is an anticoagulant (blood thinner). It is used to lower the chance of stroke in people with a medical condition called atrial fibrillation. It is also used to treat or prevent blood clots in the lungs or in the veins. This medicine may be used for other purposes; ask your health care provider or pharmacist if you have questions. COMMON BRAND NAME(S): Eliquis What should I tell my health care provider before I take this medicine? They need to know if you have any of these conditions: -bleeding disorders -bleeding in the brain -blood in your stools (black or tarry stools) or if you have blood in your vomit -history of stomach bleeding -kidney disease -liver disease -mechanical heart valve -an unusual or allergic reaction to apixaban, other medicines, foods, dyes, or preservatives -pregnant or trying to get pregnant -breast-feeding How should I use this medicine? Take this medicine by mouth with a glass of water. Follow the directions on the prescription label. You can take it with or without food. If it upsets your stomach, take it with food. Take your medicine at regular intervals. Do not take it more often than directed. Do not stop taking except on your doctor's advice. Stopping this medicine may increase your risk of a blot clot. Be sure to refill  your prescription before you run out of medicine. Talk to your pediatrician regarding the use of this medicine in children. Special care may be needed. Overdosage: If you think you have taken too much of this medicine contact a poison control center or emergency room at once. NOTE: This medicine is only for you. Do not share this medicine with others. What if I miss a dose? If you miss a dose, take it as soon as you can. If it is almost time for your next dose, take only that dose. Do not take double or extra doses. What may interact with this medicine? This medicine may interact with the following: -aspirin and aspirin-like medicines -certain medicines for fungal infections like ketoconazole and itraconazole -certain medicines for seizures like carbamazepine and phenytoin -certain medicines that treat or prevent blood clots like warfarin, enoxaparin, and dalteparin -clarithromycin -NSAIDs, medicines for pain and inflammation, like ibuprofen or naproxen -rifampin -ritonavir -St. John's wort This list may not describe all possible interactions. Give your health care provider a list of all the medicines, herbs, non-prescription drugs, or dietary supplements you use. Also tell them if you smoke, drink alcohol, or use illegal drugs. Some items may interact with your medicine. What should I watch for while using this medicine? Visit your doctor or health care professional for regular checks on your progress. Notify your doctor or health care professional and seek emergency treatment if you develop breathing problems; changes in vision; chest  pain; severe, sudden headache; pain, swelling, warmth in the leg; trouble speaking; sudden numbness or weakness of the face, arm or leg. These can be signs that your condition has gotten worse. If you are going to have surgery or other procedure, tell your doctor that you are taking this medicine. What side effects may I notice from receiving this  medicine? Side effects that you should report to your doctor or health care professional as soon as possible: -allergic reactions like skin rash, itching or hives, swelling of the face, lips, or tongue -signs and symptoms of bleeding such as bloody or black, tarry stools; red or dark-brown urine; spitting up blood or brown material that looks like coffee grounds; red spots on the skin; unusual bruising or bleeding from the eye, gums, or nose This list may not describe all possible side effects. Call your doctor for medical advice about side effects. You may report side effects to FDA at 1-800-FDA-1088. Where should I keep my medicine? Keep out of the reach of children. Store at room temperature between 20 and 25 degrees C (68 and 77 degrees F). Throw away any unused medicine after the expiration date. NOTE: This sheet is a summary. It may not cover all possible information. If you have questions about this medicine, talk to your doctor, pharmacist, or health care provider.  2018 Elsevier/Gold Standard (2016-06-26 11:54:23)   Call the [place] office at [phone] if you have any questions, problems or concerns.

## 2018-03-05 ENCOUNTER — Telehealth: Payer: Self-pay | Admitting: *Deleted

## 2018-03-05 LAB — LIPID PANEL
Chol/HDL Ratio: 4.3 ratio (ref 0.0–4.4)
Cholesterol, Total: 187 mg/dL (ref 100–199)
HDL: 44 mg/dL (ref >39)
LDL Calculated: 90 mg/dL (ref 0–99)
Triglycerides: 267 mg/dL — ABNORMAL HIGH (ref 0–149)
VLDL Cholesterol Cal: 53 mg/dL — ABNORMAL HIGH (ref 5–40)

## 2018-03-05 LAB — BASIC METABOLIC PANEL
BUN/Creatinine Ratio: 17 (ref 12–28)
BUN: 19 mg/dL (ref 10–36)
CO2: 22 mmol/L (ref 20–29)
Calcium: 9.8 mg/dL (ref 8.7–10.3)
Chloride: 98 mmol/L (ref 96–106)
Creatinine, Ser: 1.09 mg/dL — ABNORMAL HIGH (ref 0.57–1.00)
GFR calc Af Amer: 52 mL/min/{1.73_m2} — ABNORMAL LOW (ref >59)
GFR calc non Af Amer: 45 mL/min/{1.73_m2} — ABNORMAL LOW (ref >59)
Glucose: 95 mg/dL (ref 65–99)
Potassium: 4 mmol/L (ref 3.5–5.2)
Sodium: 140 mmol/L (ref 134–144)

## 2018-03-05 LAB — CBC
Hematocrit: 46.4 % (ref 34.0–46.6)
Hemoglobin: 15.1 g/dL (ref 11.1–15.9)
MCH: 30.3 pg (ref 26.6–33.0)
MCHC: 32.5 g/dL (ref 31.5–35.7)
MCV: 93 fL (ref 79–97)
Platelets: 233 10*3/uL (ref 150–379)
RBC: 4.99 x10E6/uL (ref 3.77–5.28)
RDW: 14.3 % (ref 12.3–15.4)
WBC: 9.6 10*3/uL (ref 3.4–10.8)

## 2018-03-05 LAB — HEPATIC FUNCTION PANEL
ALT: 20 IU/L (ref 0–32)
AST: 23 IU/L (ref 0–40)
Albumin: 4.3 g/dL (ref 3.2–4.6)
Alkaline Phosphatase: 54 IU/L (ref 39–117)
Bilirubin Total: 0.4 mg/dL (ref 0.0–1.2)
Bilirubin, Direct: 0.12 mg/dL (ref 0.00–0.40)
Total Protein: 7.2 g/dL (ref 6.0–8.5)

## 2018-03-05 LAB — TSH: TSH: 2.92 u[IU]/mL (ref 0.450–4.500)

## 2018-03-05 NOTE — Telephone Encounter (Signed)
Pt called in to see if pt can take advil pm, pt is advised per [name] to only take tylenol pm.

## 2018-03-14 ENCOUNTER — Other Ambulatory Visit: Payer: Self-pay

## 2018-03-14 ENCOUNTER — Ambulatory Visit (HOSPITAL_COMMUNITY): Payer: Medicare Other | Attending: Cardiology

## 2018-03-14 DIAGNOSIS — I2510 Atherosclerotic heart disease of native coronary artery without angina pectoris: Secondary | ICD-10-CM | POA: Insufficient documentation

## 2018-03-14 DIAGNOSIS — E785 Hyperlipidemia, unspecified: Secondary | ICD-10-CM | POA: Insufficient documentation

## 2018-03-14 DIAGNOSIS — I071 Rheumatic tricuspid insufficiency: Secondary | ICD-10-CM | POA: Diagnosis not present

## 2018-03-14 DIAGNOSIS — R0600 Dyspnea, unspecified: Secondary | ICD-10-CM | POA: Insufficient documentation

## 2018-03-14 DIAGNOSIS — I10 Essential (primary) hypertension: Secondary | ICD-10-CM | POA: Diagnosis not present

## 2018-03-14 DIAGNOSIS — I480 Paroxysmal atrial fibrillation: Secondary | ICD-10-CM | POA: Diagnosis not present

## 2018-03-14 DIAGNOSIS — E669 Obesity, unspecified: Secondary | ICD-10-CM | POA: Insufficient documentation

## 2018-03-14 DIAGNOSIS — R002 Palpitations: Secondary | ICD-10-CM | POA: Insufficient documentation

## 2018-03-18 ENCOUNTER — Telehealth: Payer: Self-pay | Admitting: *Deleted

## 2018-03-18 NOTE — Telephone Encounter (Signed)
Pt called in due to echo results.  When given results stated was given a different appt date.  Did not see anything listed in pt charts but advised pt of 4/30 appt.

## 2018-03-21 DIAGNOSIS — I1310 Hypertensive heart and chronic kidney disease without heart failure, with stage 1 through stage 4 chronic kidney disease, or unspecified chronic kidney disease: Secondary | ICD-10-CM | POA: Diagnosis not present

## 2018-03-21 DIAGNOSIS — N390 Urinary tract infection, site not specified: Secondary | ICD-10-CM | POA: Diagnosis not present

## 2018-03-25 DIAGNOSIS — L570 Actinic keratosis: Secondary | ICD-10-CM | POA: Diagnosis not present

## 2018-04-16 ENCOUNTER — Ambulatory Visit (INDEPENDENT_AMBULATORY_CARE_PROVIDER_SITE_OTHER): Payer: Medicare Other | Admitting: Nurse Practitioner

## 2018-04-16 ENCOUNTER — Encounter (INDEPENDENT_AMBULATORY_CARE_PROVIDER_SITE_OTHER): Payer: Self-pay

## 2018-04-16 ENCOUNTER — Encounter: Payer: Self-pay | Admitting: Nurse Practitioner

## 2018-04-16 VITALS — BP 130/90 | HR 71 | Ht 66.0 in | Wt 195.8 lb

## 2018-04-16 DIAGNOSIS — I10 Essential (primary) hypertension: Secondary | ICD-10-CM

## 2018-04-16 DIAGNOSIS — I480 Paroxysmal atrial fibrillation: Secondary | ICD-10-CM

## 2018-04-16 DIAGNOSIS — I2510 Atherosclerotic heart disease of native coronary artery without angina pectoris: Secondary | ICD-10-CM | POA: Diagnosis not present

## 2018-04-16 NOTE — Patient Instructions (Addendum)
We will be checking the following labs today - BMET & CBC   Medication Instructions:    Continue with your current medicines.     Testing/Procedures To Be Arranged:  N/A  Follow-Up:   See me in 3 months    Other Special Instructions:   N/A    If you need a refill on your cardiac medications before your next appointment, please call your pharmacy.   Call the [place] office at [phone] if you have any questions, problems or concerns.

## 2018-04-16 NOTE — Progress Notes (Signed)
CARDIOLOGY OFFICE NOTE  Date:  04/16/2018    Tanya Ho Date of Birth: [birth year] Medical Record #[id number]  PCP:  Shon Baton, MD  Cardiologist:  Servando Snare Nahser    Chief Complaint  Patient presents with  . Atrial Fibrillation    Follow up visit - seen for Dr. Acie Fredrickson    History of Present Illness: Tanya Ho is a 84 y.o. female who presents today for a follow up visit. Seen for Dr. Acie Fredrickson.  Former patient of Dr. Susa Simmonds.   She has known mild/moderate CAD (maintained on chronic Plavix) from cardiac cath back in 2007, HTN, HLD, palpitations, obesity, OA, hiatal hernia, GERD. She has her labs checked by Dr. Virgina Jock.   I saw her back in June of 2016. Her husband had just passed away earlier that month. I saw her back shortly thereafter. She has had issues with grief/coping. She has been getting more forgetful. Has remained pretty depressed. She has tended to have issues with polypharmacy. She has had intermittent chest pain - has not tolerated Imdur. Remains anxious as a general rule.   Last seen in March - found to have new onset AF - started on Eliquis.   Comes back today. Here alone today - Tanya Ho her daughter - is in the waiting room. Tanya Ho seems to be doing ok. She is tired. She says she is doing what she wants and when she wants. She is eating what she wants. Says I do "no more than I have to". Only had one spell of chest pain/indigestion - she had had hotdogs and pistachios among other foods. No relief with NTG but used multiple TUMS and her anxiety medicine. She had a fall this past Saturday - was in her yard - in her slippers - fell backwards with just trying to move backwards. She did not pass out. She did not seek medical attention. She did not sustain any injury. Still admits that she gets pretty fearful at night. Otherwise, she seems to be ok.   Past Medical History:  Diagnosis Date  . Back pain   . Bruises easily   . Coronary artery disease    NONOBSTRUCTIVE  . Fatigue   . Hyperlipidemia   . Hypertension   . Knee pain   . OA (osteoarthritis)   . Obesity   . Palpitations   . SOB (shortness of breath)     Past Surgical History:  Procedure Laterality Date  . BREAST BIOPSY     LEFT BREAST  . CARDIAC CATHETERIZATION  05/11/2006   OVERALL CARDIAC SIZE AND SILHOUETTE ARE NORMAL. EF ESTIMATED 60%  . KIDNEY STONES  1982  . KNEE ARTHROSCOPY  09/22/2008  . VAGINAL HYSTERECTOMY       Medications: Current Meds  Medication Sig  . ALPRAZolam (XANAX) 0.5 MG tablet Take 1 mg by mouth at bedtime.   . ALPRAZolam (XANAX) 1 MG tablet Take 1 mg by mouth at bedtime as needed.  Marland Kitchen apixaban (ELIQUIS) 5 MG TABS tablet Take 1 tablet (5 mg total) by mouth 2 (two) times daily.  . ergocalciferol (VITAMIN D2) 50000 UNITS capsule Take 50,000 Units by mouth once a week.    . Ibuprofen (ADVIL PO) Take 400 mg by mouth as needed.   . metoprolol (LOPRESSOR) 100 MG tablet Take 1 tablet (100 mg total) by mouth 2 (two) times daily.  . nitroGLYCERIN (NITROSTAT) 0.4 MG SL tablet Place 1 tablet (0.4 mg total) under the tongue every 5 (five) minutes  as needed for chest pain.  . pantoprazole (PROTONIX) 40 MG tablet Take 40 mg by mouth daily.   . ranitidine (ZANTAC) 300 MG tablet Take 300 mg by mouth at bedtime.   . simvastatin (ZOCOR) 20 MG tablet Take 20 mg by mouth every evening.  . simvastatin (ZOCOR) 40 MG tablet Take 40 mg by mouth daily at 6 PM.   . traMADol (ULTRAM) 50 MG tablet TAKE 1-2 TABLETS BY MOUTH EVERY 6-8 HOURS AS NEEDED FOR PAIN  . triamterene-hydrochlorothiazide (DYAZIDE) 37.5-25 MG per capsule Take 1 capsule by mouth every morning.       Allergies: Allergies  Allergen Reactions  . Codeine   . Demerol   . Morphine And Related     Social History: The patient  reports that she has never smoked. She has never used smokeless tobacco. She reports that she does not drink alcohol or use drugs.   Family History: The patient's family  history includes Heart attack in her father; Heart disease in her mother; Stroke in her mother.   Review of Systems: Please see the history of present illness.   Otherwise, the review of systems is positive for none.   All other systems are reviewed and negative.   Physical Exam: VS:  BP 130/90 (BP Location: Left Arm, Patient Position: Sitting, Cuff Size: Normal)   Pulse 71   Ht 5\' 6"  (1.676 m)   Wt 195 lb 12.8 oz (88.8 kg)   BMI 31.60 kg/m  .  BMI Body mass index is 31.6 kg/m.  Wt Readings from Last 3 Encounters:  04/16/18 195 lb 12.8 oz (88.8 kg)  03/04/18 191 lb 12.8 oz (87 kg)  08/29/17 194 lb 1.9 oz (88.1 kg)    General: Pleasant. Well developed, well nourished and in no acute distress.   HEENT: Normal.  Neck: Supple, no JVD, carotid bruits, or masses noted.  Cardiac: Irregular irregular rhythm. Rate is ok. No murmurs, rubs, or gallops. No edema.  Respiratory:  Lungs are clear to auscultation bilaterally with normal work of breathing.  GI: Soft and nontender.  MS: No deformity or atrophy. Gait and ROM intact.  Skin: Warm and dry. Color is normal.  Neuro:  Strength and sensation are intact and no gross focal deficits noted.  Psych: Alert, appropriate and with normal affect.   LABORATORY DATA:  EKG:  EKG is ordered today. This demonstrates AF with controlled VR.  Lab Results  Component Value Date   WBC 9.6 03/04/2018   HGB 15.1 03/04/2018   HCT 46.4 03/04/2018   PLT 233 03/04/2018   GLUCOSE 95 03/04/2018   CHOL 187 03/04/2018   TRIG 267 (H) 03/04/2018   HDL 44 03/04/2018   LDLCALC 90 03/04/2018   ALT 20 03/04/2018   AST 23 03/04/2018   NA 140 03/04/2018   K 4.0 03/04/2018   CL 98 03/04/2018   CREATININE 1.09 (H) 03/04/2018   BUN 19 03/04/2018   CO2 22 03/04/2018   TSH 2.920 03/04/2018     BNP (last 3 results) No results for input(s): BNP in the last 8760 hours.  ProBNP (last 3 results) No results for input(s): PROBNP in the last 8760 hours.   Other  Studies Reviewed Today:  Echo Study Conclusions 02/2018  - Left ventricle: The cavity size was normal. Wall thickness was   normal. Systolic function was normal. The estimated ejection   fraction was in the range of 55% to 60%. - Mitral valve: Calcified annulus. - Right atrium: The  atrium was mildly dilated. - Tricuspid valve: There was mild-moderate regurgitation. - Pulmonary arteries: PA peak pressure: 37 mm Hg (S).   Assessment/Plan:  1. Recent fall - fortunately no injury - she was reminded about seeking medical attention/CT scan if hits her head. She is cautioned about what to look for.   2. Persistent AF - managed with rate control and continued anticoagulation with Eliquis  3. High risk medicine - checking labs  4. Situational stress/grief - always an issue.   5. CAD - managed medically - has not tolerated Imdur in the past - would favor conservative management.   6. HTN - BP ok today.   Current medicines are reviewed with the patient today.  The patient does not have concerns regarding medicines other than what has been noted above.  The following changes have been made:  See above.  Labs/ tests ordered today include:    Orders Placed This Encounter  Procedures  . Basic metabolic panel  . CBC  . EKG 12-Lead     Disposition:   FU with me in 3 months.   Patient is agreeable to this plan and will call if any problems develop in the interim.   SignedTruitt Merle, NP  04/16/2018 2:32 PM  [facility] [address] [place], [place]  [address] Phone: [phone] Fax: [phone]

## 2018-04-17 LAB — BASIC METABOLIC PANEL
BUN/Creatinine Ratio: 12 (ref 12–28)
BUN: 14 mg/dL (ref 10–36)
CO2: 24 mmol/L (ref 20–29)
Calcium: 9.4 mg/dL (ref 8.7–10.3)
Chloride: 98 mmol/L (ref 96–106)
Creatinine, Ser: 1.21 mg/dL — ABNORMAL HIGH (ref 0.57–1.00)
GFR calc Af Amer: 46 mL/min/{1.73_m2} — ABNORMAL LOW (ref >59)
GFR calc non Af Amer: 39 mL/min/{1.73_m2} — ABNORMAL LOW (ref >59)
Glucose: 208 mg/dL — ABNORMAL HIGH (ref 65–99)
Potassium: 4.7 mmol/L (ref 3.5–5.2)
Sodium: 141 mmol/L (ref 134–144)

## 2018-04-17 LAB — CBC
Hematocrit: 42.5 % (ref 34.0–46.6)
Hemoglobin: 14.4 g/dL (ref 11.1–15.9)
MCH: 30.6 pg (ref 26.6–33.0)
MCHC: 33.9 g/dL (ref 31.5–35.7)
MCV: 90 fL (ref 79–97)
Platelets: 235 10*3/uL (ref 150–379)
RBC: 4.71 x10E6/uL (ref 3.77–5.28)
RDW: 14 % (ref 12.3–15.4)
WBC: 9.7 10*3/uL (ref 3.4–10.8)

## 2018-06-26 DIAGNOSIS — Z6833 Body mass index (BMI) 33.0-33.9, adult: Secondary | ICD-10-CM | POA: Diagnosis not present

## 2018-06-26 DIAGNOSIS — N3020 Other chronic cystitis without hematuria: Secondary | ICD-10-CM | POA: Diagnosis not present

## 2018-06-26 DIAGNOSIS — K219 Gastro-esophageal reflux disease without esophagitis: Secondary | ICD-10-CM | POA: Diagnosis not present

## 2018-06-26 DIAGNOSIS — R600 Localized edema: Secondary | ICD-10-CM | POA: Diagnosis not present

## 2018-06-26 DIAGNOSIS — I10 Essential (primary) hypertension: Secondary | ICD-10-CM | POA: Diagnosis not present

## 2018-06-26 DIAGNOSIS — R05 Cough: Secondary | ICD-10-CM | POA: Diagnosis not present

## 2018-07-01 ENCOUNTER — Ambulatory Visit: Payer: Medicare Other | Admitting: Nurse Practitioner

## 2018-07-08 DIAGNOSIS — R300 Dysuria: Secondary | ICD-10-CM | POA: Diagnosis not present

## 2018-07-08 DIAGNOSIS — N390 Urinary tract infection, site not specified: Secondary | ICD-10-CM | POA: Diagnosis not present

## 2018-07-16 ENCOUNTER — Encounter (INDEPENDENT_AMBULATORY_CARE_PROVIDER_SITE_OTHER): Payer: Self-pay

## 2018-07-16 ENCOUNTER — Telehealth: Payer: Self-pay | Admitting: *Deleted

## 2018-07-16 ENCOUNTER — Ambulatory Visit (INDEPENDENT_AMBULATORY_CARE_PROVIDER_SITE_OTHER): Payer: Medicare Other | Admitting: Nurse Practitioner

## 2018-07-16 ENCOUNTER — Encounter: Payer: Self-pay | Admitting: Nurse Practitioner

## 2018-07-16 ENCOUNTER — Other Ambulatory Visit: Payer: Self-pay | Admitting: *Deleted

## 2018-07-16 VITALS — BP 130/80 | HR 77 | Ht 66.0 in | Wt 200.4 lb

## 2018-07-16 DIAGNOSIS — I480 Paroxysmal atrial fibrillation: Secondary | ICD-10-CM | POA: Diagnosis not present

## 2018-07-16 DIAGNOSIS — I481 Persistent atrial fibrillation: Secondary | ICD-10-CM | POA: Diagnosis not present

## 2018-07-16 DIAGNOSIS — I10 Essential (primary) hypertension: Secondary | ICD-10-CM

## 2018-07-16 DIAGNOSIS — F4321 Adjustment disorder with depressed mood: Secondary | ICD-10-CM

## 2018-07-16 DIAGNOSIS — I4819 Other persistent atrial fibrillation: Secondary | ICD-10-CM

## 2018-07-16 DIAGNOSIS — I2510 Atherosclerotic heart disease of native coronary artery without angina pectoris: Secondary | ICD-10-CM | POA: Diagnosis not present

## 2018-07-16 NOTE — Patient Instructions (Addendum)
We will be checking the following labs today - NONE  Medication Instructions:    Continue with your current medicines. BUT  I am stopping the Simvastatin    Testing/Procedures To Be Arranged:  N/A  Follow-Up:   See me in October    Other Special Instructions:   Call us with all your medicines when you get home.     If you need a refill on your cardiac medications before your next appointment, please call your pharmacy.   Call the [place] office at [phone] if you have any questions, problems or concerns.

## 2018-07-16 NOTE — Progress Notes (Signed)
CARDIOLOGY OFFICE NOTE  Date:  07/16/2018    Tanya Ho Date of Birth: 02/04/1926 Medical Record #[id number]  PCP:  Tanya Baton, MD  Cardiologist:  Tanya Ho    Chief Complaint  Patient presents with  . Hypertension  . Atrial Fibrillation    3 month check - seen for Dr. Acie Ho    History of Present Illness: Tanya Ho is a 84 y.o. female who presents today for a follow up visit. This is a 3 month check. Seen for Dr. Acie Ho. Former patient of Dr. Susa Ho.   She has known mild/moderate CAD (maintained on chronic Plavix) from cardiac cath back in 2007, HTN, HLD, palpitations, obesity, OA, hiatal hernia, GERD. She has her labs checked by Dr. Virgina Ho.   I saw her back in June of 2016. Her husband had just passed away earlier that month. I saw her back shortly thereafter. She has had issues with grief/coping. She has been getting more forgetful. Has remained pretty depressed. She has tended to have issues with polypharmacy. She has had intermittent chest pain - has not tolerated Imdur. Remains anxious as a general rule.   When seen in March of 2019 - found to have new onset AF - started on Eliquis. Last seen back in April - she was doing ok. Had had a fall. Still pretty anxious and fearful. She has had intermittent bouts of chest pain/indigestion. She has not tolerated Imdur in the past.   Comes back today. Here with Tanya Ho her daughter today.  She has had recent UTI and bronchitis. She has finished antibiotics. She is quite unsure about her medicines. Says she is alternating her Dyazide with Lasix?? But that the dyazide (she thinks) hurts her bladder. Her legs are swelling. She is eating out - just finished at [employer]. She notes her memory is getting worse. She is unsure about the dose of Zocor. She has lots of joint issues. She thinks the Eliquis makes her tired. She says she knows that she is "slowing down". Doing less. Getting out less.   Past Medical History:    Diagnosis Date  . Back pain   . Bruises easily   . Coronary artery disease    NONOBSTRUCTIVE  . Fatigue   . Hyperlipidemia   . Hypertension   . Knee pain   . OA (osteoarthritis)   . Obesity   . Palpitations   . SOB (shortness of breath)     Past Surgical History:  Procedure Laterality Date  . BREAST BIOPSY     LEFT BREAST  . CARDIAC CATHETERIZATION  05/11/2006   OVERALL CARDIAC SIZE AND SILHOUETTE ARE NORMAL. EF ESTIMATED 60%  . KIDNEY STONES  1982  . KNEE ARTHROSCOPY  09/22/2008  . VAGINAL HYSTERECTOMY       Medications: Current Meds  Medication Sig  . ALPRAZolam (XANAX) 1 MG tablet Take 1 mg by mouth at bedtime as needed.  Marland Kitchen apixaban (ELIQUIS) 5 MG TABS tablet Take 1 tablet (5 mg total) by mouth 2 (two) times daily.  . ergocalciferol (VITAMIN D2) 50000 UNITS capsule Take 50,000 Units by mouth once a week.    . Ibuprofen (ADVIL PO) Take 400 mg by mouth as needed.   . metoprolol (LOPRESSOR) 100 MG tablet Take 1 tablet (100 mg total) by mouth 2 (two) times daily.  . nitroGLYCERIN (NITROSTAT) 0.4 MG SL tablet Place 1 tablet (0.4 mg total) under the tongue every 5 (five) minutes as needed for chest pain.  Marland Kitchen  pantoprazole (PROTONIX) 40 MG tablet Take 40 mg by mouth daily.   . ranitidine (ZANTAC) 300 MG tablet Take 300 mg by mouth at bedtime.   . traMADol (ULTRAM) 50 MG tablet TAKE 1-2 TABLETS BY MOUTH EVERY 6-8 HOURS AS NEEDED FOR PAIN  . triamterene-hydrochlorothiazide (DYAZIDE) 37.5-25 MG per capsule Take 1 capsule by mouth every morning.    . [DISCONTINUED] simvastatin (ZOCOR) 20 MG tablet Take 20 mg by mouth every evening.  . [DISCONTINUED] simvastatin (ZOCOR) 40 MG tablet Take 40 mg by mouth daily at 6 PM.      Allergies: Allergies  Allergen Reactions  . Codeine   . Demerol   . Morphine And Related     Social History: The patient  reports that she has never smoked. She has never used smokeless tobacco. She reports that she does not drink alcohol or use drugs.    Family History: The patient's family history includes Heart attack in her father; Heart disease in her mother; Stroke in her mother.   Review of Systems: Please see the history of present illness.   Otherwise, the review of systems is positive for none.   All other systems are reviewed and negative.   Physical Exam: VS:  BP 130/80 (BP Location: Left Arm, Patient Position: Sitting, Cuff Size: Normal)   Pulse 77   Ht 5\' 6"  (1.676 m)   Wt 200 lb 6.4 oz (90.9 kg)   SpO2 98% Comment: at rest  BMI 32.35 kg/m  .  BMI Body mass index is 32.35 kg/m.  Wt Readings from Last 3 Encounters:  07/16/18 200 lb 6.4 oz (90.9 kg)  04/16/18 195 lb 12.8 oz (88.8 kg)  03/04/18 191 lb 12.8 oz (87 kg)    General: Pleasant. Well developed, well nourished and in no acute distress.  She has gained weight.  HEENT: Normal.  Neck: Supple, no JVD, carotid bruits, or masses noted.  Cardiac: Irregular irregular rhythm. Rate is ok. Trace edema.  Respiratory:  Lungs are clear to auscultation bilaterally with normal work of breathing.  GI: Soft and nontender.  MS: No deformity or atrophy. Gait and ROM intact.  Skin: Warm and dry. Color is normal.  Neuro:  Strength and sensation are intact and no gross focal deficits noted.  Psych: Alert, appropriate and with normal affect.   LABORATORY DATA:  EKG:  EKG is not ordered today.  Lab Results  Component Value Date   WBC 9.7 04/16/2018   HGB 14.4 04/16/2018   HCT 42.5 04/16/2018   PLT 235 04/16/2018   GLUCOSE 208 (H) 04/16/2018   CHOL 187 03/04/2018   TRIG 267 (H) 03/04/2018   HDL 44 03/04/2018   LDLCALC 90 03/04/2018   ALT 20 03/04/2018   AST 23 03/04/2018   NA 141 04/16/2018   K 4.7 04/16/2018   CL 98 04/16/2018   CREATININE 1.21 (H) 04/16/2018   BUN 14 04/16/2018   CO2 24 04/16/2018   TSH 2.920 03/04/2018     BNP (last 3 results) No results for input(s): BNP in the last 8760 hours.  ProBNP (last 3 results) No results for input(s): PROBNP in  the last 8760 hours.   Other Studies Reviewed Today:   Echo Study Conclusions 02/2018  - Left ventricle: The cavity size was normal. Wall thickness was normal. Systolic function was normal. The estimated ejection fraction was in the range of 55% to 60%. - Mitral valve: Calcified annulus. - Right atrium: The atrium was mildly dilated. - Tricuspid valve:  There was mild-moderate regurgitation. - Pulmonary arteries: PA peak pressure: 37 mm Hg (S).   Assessment/Plan:  1. Persistent AF - managed with rate control and continued anticoagulation with Eliquis - I think she is doing ok. No changes made today - we do need to get her medicines clarified.   2. High risk medicine - need to clarify. She is going to call back when she gets home.   3. Situational stress/grief - always an issue. I think she is depressed - I do not see this changing.   4. CAD - managed medically - has not tolerated Imdur in the past - would favor conservative management. She does not really mention this today.   5. HTN - BP ok today. No changes made  6. HLD - with lots of somatic complaints - will stop the statin.    Current medicines are reviewed with the patient today.  The patient does not have concerns regarding medicines other than what has been noted above.  The following changes have been made:  See above.  Labs/ tests ordered today include:   No orders of the defined types were placed in this encounter.    Disposition:   FU with me in October as planned.   Patient is agreeable to this plan and will call if any problems develop in the interim.   SignedTruitt Merle, NP  07/16/2018 2:16 PM  [facility] [address] [place], [place]  [address] Phone: [phone] Fax: [phone]

## 2018-07-16 NOTE — Telephone Encounter (Signed)
Pt calling in today to clarify medications.  All medications updated.

## 2018-09-17 DIAGNOSIS — M1711 Unilateral primary osteoarthritis, right knee: Secondary | ICD-10-CM | POA: Diagnosis not present

## 2018-09-17 DIAGNOSIS — M1712 Unilateral primary osteoarthritis, left knee: Secondary | ICD-10-CM | POA: Diagnosis not present

## 2018-09-20 DIAGNOSIS — R82998 Other abnormal findings in urine: Secondary | ICD-10-CM | POA: Diagnosis not present

## 2018-09-20 DIAGNOSIS — M810 Age-related osteoporosis without current pathological fracture: Secondary | ICD-10-CM | POA: Diagnosis not present

## 2018-09-20 DIAGNOSIS — E7849 Other hyperlipidemia: Secondary | ICD-10-CM | POA: Diagnosis not present

## 2018-09-20 DIAGNOSIS — R7302 Impaired glucose tolerance (oral): Secondary | ICD-10-CM | POA: Diagnosis not present

## 2018-09-20 DIAGNOSIS — I10 Essential (primary) hypertension: Secondary | ICD-10-CM | POA: Diagnosis not present

## 2018-09-27 DIAGNOSIS — Z6831 Body mass index (BMI) 31.0-31.9, adult: Secondary | ICD-10-CM | POA: Diagnosis not present

## 2018-09-27 DIAGNOSIS — D692 Other nonthrombocytopenic purpura: Secondary | ICD-10-CM | POA: Diagnosis not present

## 2018-09-27 DIAGNOSIS — E7849 Other hyperlipidemia: Secondary | ICD-10-CM | POA: Diagnosis not present

## 2018-09-27 DIAGNOSIS — Z23 Encounter for immunization: Secondary | ICD-10-CM | POA: Diagnosis not present

## 2018-09-27 DIAGNOSIS — N183 Chronic kidney disease, stage 3 (moderate): Secondary | ICD-10-CM | POA: Diagnosis not present

## 2018-09-27 DIAGNOSIS — I2721 Secondary pulmonary arterial hypertension: Secondary | ICD-10-CM | POA: Diagnosis not present

## 2018-09-27 DIAGNOSIS — I1310 Hypertensive heart and chronic kidney disease without heart failure, with stage 1 through stage 4 chronic kidney disease, or unspecified chronic kidney disease: Secondary | ICD-10-CM | POA: Diagnosis not present

## 2018-09-27 DIAGNOSIS — I480 Paroxysmal atrial fibrillation: Secondary | ICD-10-CM | POA: Diagnosis not present

## 2018-09-27 DIAGNOSIS — Z1389 Encounter for screening for other disorder: Secondary | ICD-10-CM | POA: Diagnosis not present

## 2018-09-27 DIAGNOSIS — Z0000 Encounter for general adult medical examination without abnormal findings: Secondary | ICD-10-CM | POA: Diagnosis not present

## 2018-09-27 DIAGNOSIS — E668 Other obesity: Secondary | ICD-10-CM | POA: Diagnosis not present

## 2018-09-27 DIAGNOSIS — I2510 Atherosclerotic heart disease of native coronary artery without angina pectoris: Secondary | ICD-10-CM | POA: Diagnosis not present

## 2018-09-27 DIAGNOSIS — E119 Type 2 diabetes mellitus without complications: Secondary | ICD-10-CM | POA: Diagnosis not present

## 2018-10-02 ENCOUNTER — Other Ambulatory Visit: Payer: Self-pay | Admitting: Nurse Practitioner

## 2018-10-02 NOTE — Telephone Encounter (Signed)
Pt saw Tera Helper. NP on 04/16/18. Weight at that visit was 90.9Kg. Last noted SCr was 1.21 on 04/16/18. With specified criteria will order Eliquis 5mg  BID.

## 2018-10-14 NOTE — Progress Notes (Signed)
CARDIOLOGY OFFICE NOTE  Date:  10/15/2018    Tanya Ho Date of Birth: 08-14-1926 Medical Record #[id number]  PCP:  Shon Baton, MD  Cardiologist:  Servando Snare Nahser    Chief Complaint  Patient presents with  . Hypertension  . Hyperlipidemia  . Chest Pain    Follow up visit - seen for Dr. Acie Fredrickson    History of Present Illness: Tanya Ho is a 84 y.o. female who presents today for a 6 month check.  Seen for Dr. Acie Fredrickson.Former patient of Dr. Susa Simmonds.Primarily follows with me.    She has known mild/moderate CAD (maintained on chronic Plavix) from cardiac cath back in 2007, HTN, HLD, palpitations, obesity, OA, hiatal hernia, GERD. She has her labs checked by Dr. Virgina Jock.   I saw her back in June of 2016. Her husband had just passed away earlier that month. I saw her back shortly thereafter. She has had issues with grief/coping. She has beengetting more forgetful. Has remained pretty depressed. She has tended to have issues with polypharmacy. She has had intermittent chest pain - has not tolerated Imdur. Remains anxious as a general rule.  When seen in March of 2019 - found to have new onset AF - started on Eliquis.Still pretty anxious and fearful. She has had intermittent bouts of chest pain/indigestion. She has not tolerated Imdur in the past. Last seen in July - cardiac status stable but seemed to be in a general decline overall.   Comes back today. Here with Normaher daughter today. She "has popped a NTG" a few nights ago. She will do this on an occasion. Certainly not out of her pattern. Took a Benadryl this morning for her allergies - very unsteady on her feet - has fallen - almost fell on the scales today. She tries to be as active as she can - her back limits her. She brought me copies of her recent labs. She is no longer on statin. No real major concerns noted.   Past Medical History:  Diagnosis Date  . Back pain   . Bruises easily   . Coronary artery disease     NONOBSTRUCTIVE  . Fatigue   . Hyperlipidemia   . Hypertension   . Knee pain   . OA (osteoarthritis)   . Obesity   . Palpitations   . SOB (shortness of breath)     Past Surgical History:  Procedure Laterality Date  . BREAST BIOPSY     LEFT BREAST  . CARDIAC CATHETERIZATION  05/11/2006   OVERALL CARDIAC SIZE AND SILHOUETTE ARE NORMAL. EF ESTIMATED 60%  . KIDNEY STONES  1982  . KNEE ARTHROSCOPY  09/22/2008  . VAGINAL HYSTERECTOMY       Medications: Current Meds  Medication Sig  . ALPRAZolam (XANAX) 1 MG tablet Take 1 mg by mouth at bedtime as needed.  . ergocalciferol (VITAMIN D2) 50000 UNITS capsule Take 50,000 Units by mouth once a week.    . Ibuprofen (ADVIL PO) Take 400 mg by mouth as needed.   . metoprolol (LOPRESSOR) 100 MG tablet Take 1 tablet (100 mg total) by mouth 2 (two) times daily.  . nitroGLYCERIN (NITROSTAT) 0.4 MG SL tablet Place 1 tablet (0.4 mg total) under the tongue every 5 (five) minutes as needed for chest pain.  . pantoprazole (PROTONIX) 40 MG tablet Take 40 mg by mouth daily.   . ranitidine (ZANTAC) 300 MG tablet Take 300 mg by mouth at bedtime.   . traMADol (ULTRAM) 50  MG tablet TAKE 1-2 TABLETS BY MOUTH EVERY 6-8 HOURS AS NEEDED FOR PAIN  . triamterene-hydrochlorothiazide (DYAZIDE) 37.5-25 MG per capsule Take 1 capsule by mouth every morning.    . [DISCONTINUED] ELIQUIS 5 MG TABS tablet TAKE 1 TABLET BY MOUTH TWICE A DAY     Allergies: Allergies  Allergen Reactions  . Codeine   . Demerol   . Morphine And Related     Social History: The patient  reports that she has never smoked. She has never used smokeless tobacco. She reports that she does not drink alcohol or use drugs.   Family History: The patient's family history includes Heart attack in her father; Heart disease in her mother; Stroke in her mother.   Review of Systems: Please see the history of present illness.   Otherwise, the review of systems is positive for none.   All other  systems are reviewed and negative.   Physical Exam: VS:  BP 120/80 (BP Location: Left Arm, Patient Position: Sitting, Cuff Size: Large)   Pulse 64   Ht 5\' 6"  (1.676 m)   Wt 197 lb 6.4 oz (89.5 kg)   SpO2 96% Comment: at rest  BMI 31.86 kg/m  .  BMI Body mass index is 31.86 kg/m.  Wt Readings from Last 3 Encounters:  10/15/18 197 lb 6.4 oz (89.5 kg)  07/16/18 200 lb 6.4 oz (90.9 kg)  04/16/18 195 lb 12.8 oz (88.8 kg)    General: Pleasant. Elderly - she looks younger than his stated age. Alert and in no acute distress.   HEENT: Normal.  Neck: Supple, no JVD, carotid bruits, or masses noted.  Cardiac: Irregular irregular rhythm. Rate is ok. Trace edema.  Respiratory:  Lungs are clear to auscultation bilaterally with normal work of breathing.  GI: Soft and nontender.  MS: No deformity or atrophy. Gait and ROM intact.  Skin: Warm and dry. Color is normal.  Neuro:  Strength and sensation are intact and no gross focal deficits noted.  Psych: Alert, appropriate and with normal affect.   LABORATORY DATA:  EKG:  EKG is not ordered today.  Lab Results  Component Value Date   WBC 9.7 04/16/2018   HGB 14.4 04/16/2018   HCT 42.5 04/16/2018   PLT 235 04/16/2018   GLUCOSE 208 (H) 04/16/2018   CHOL 187 03/04/2018   TRIG 267 (H) 03/04/2018   HDL 44 03/04/2018   LDLCALC 90 03/04/2018   ALT 20 03/04/2018   AST 23 03/04/2018   NA 141 04/16/2018   K 4.7 04/16/2018   CL 98 04/16/2018   CREATININE 1.21 (H) 04/16/2018   BUN 14 04/16/2018   CO2 24 04/16/2018   TSH 2.920 03/04/2018       BNP (last 3 results) No results for input(s): BNP in the last 8760 hours.  ProBNP (last 3 results) No results for input(s): PROBNP in the last 8760 hours.   Other Studies Reviewed Today:  EchoStudy Conclusions3/2019  - Left ventricle: The cavity size was normal. Wall thickness was normal. Systolic function was normal. The estimated ejection fraction was in the range of 55% to  60%. - Mitral valve: Calcified annulus. - Right atrium: The atrium was mildly dilated. - Tricuspid valve: There was mild-moderate regurgitation. - Pulmonary arteries: PA peak pressure: 37 mm Hg (S).   Assessment/Plan:  1. Persistent AF - she is managed with rate control and anticoagulation with Eliquis. No problems noted. No changes made today.   2. High risk medicine - no problems  noted.   3. Situational stress/grief - always an issue. I do not think this will change.   4. CAD - she has been managed medically - she has not tolerated Imdur in the past - she will use NTG on occasion. Her pattern is unchanged.   5. HTN - BP looks ok - no changes made today.   6. HLD - no longer on her statin.   7. Advanced age.   Current medicines are reviewed with the patient today.  The patient does not have concerns regarding medicines other than what has been noted above.  The following changes have been made:  See above.  Labs/ tests ordered today include:   No orders of the defined types were placed in this encounter.    Disposition:   FU with me in 6 months.   Patient is agreeable to this plan and will call if any problems develop in the interim.   SignedTruitt Merle, NP  10/15/2018 12:41 PM  [facility] [address] [place], [place]  [address] Phone: [phone] Fax: [phone]

## 2018-10-15 ENCOUNTER — Other Ambulatory Visit: Payer: Self-pay | Admitting: *Deleted

## 2018-10-15 ENCOUNTER — Encounter: Payer: Self-pay | Admitting: Nurse Practitioner

## 2018-10-15 ENCOUNTER — Ambulatory Visit (INDEPENDENT_AMBULATORY_CARE_PROVIDER_SITE_OTHER): Payer: Medicare Other | Admitting: Nurse Practitioner

## 2018-10-15 VITALS — BP 120/80 | HR 64 | Ht 66.0 in | Wt 197.4 lb

## 2018-10-15 DIAGNOSIS — I480 Paroxysmal atrial fibrillation: Secondary | ICD-10-CM | POA: Diagnosis not present

## 2018-10-15 DIAGNOSIS — I2510 Atherosclerotic heart disease of native coronary artery without angina pectoris: Secondary | ICD-10-CM

## 2018-10-15 DIAGNOSIS — Z79899 Other long term (current) drug therapy: Secondary | ICD-10-CM

## 2018-10-15 DIAGNOSIS — F4321 Adjustment disorder with depressed mood: Secondary | ICD-10-CM

## 2018-10-15 DIAGNOSIS — I10 Essential (primary) hypertension: Secondary | ICD-10-CM | POA: Diagnosis not present

## 2018-10-15 MED ORDER — APIXABAN 5 MG PO TABS: 5.0000 mg | ORAL_TABLET | Freq: Two times a day (BID) | ORAL | 2 refills | Status: DC

## 2018-10-15 NOTE — Patient Instructions (Addendum)
We will be checking the following labs today - NONE    Medication Instructions:    Continue with your current medicines.    If you need a refill on your cardiac medications before your next appointment, please call your pharmacy.     Testing/Procedures To Be Arranged:  N/A  Follow-Up:   See me in 6 months    At [facility], you and your health needs are our priority.  As part of our continuing mission to provide you with exceptional heart care, we have created designated Provider Care Teams.  These Care Teams include your primary Cardiologist (physician) and Advanced Practice Providers (APPs -  Physician Assistants and Nurse Practitioners) who all work together to provide you with the care you need, when you need it.  Special Instructions:  . None  Call the [facility] office at [phone] if you have any questions, problems or concerns.       

## 2019-01-13 ENCOUNTER — Other Ambulatory Visit: Payer: Self-pay

## 2019-01-13 MED ORDER — APIXABAN 5 MG PO TABS: 5.0000 mg | ORAL_TABLET | Freq: Two times a day (BID) | ORAL | 2 refills | Status: DC

## 2019-01-13 NOTE — Telephone Encounter (Signed)
Pt's son called in wanting to know if we could send it a 90 day prescription of Eliquis to [facility]. He states that [facility] called and stated that they have a prescription for eliquis but it is for a 30 day supply. Pt says that she does not like to go pick up her medication every 30 days she prefers to have them come through the mail.

## 2019-01-13 NOTE — Telephone Encounter (Signed)
Pt last saw Truitt Merle, NP on 10/15/18, last labs 09/20/18 Creat 1.1, age 85, weight 89.5kg, based on specified criteria pt is on appropriate dosage of Eliquis 5mg  BID.  Will refill rx.

## 2019-04-10 ENCOUNTER — Telehealth: Payer: Self-pay | Admitting: *Deleted

## 2019-04-10 NOTE — Telephone Encounter (Signed)

## 2019-04-14 NOTE — Progress Notes (Signed)
Telehealth Visit      Virtual Visit via Video Note   This visit type was conducted due to national recommendations for restrictions regarding the COVID-19 Pandemic (e.g. social distancing) in an effort to limit this patient's exposure and mitigate transmission in our community.  Due to her co-morbid illnesses, this patient is at least at moderate risk for complications without adequate follow up.  This format is felt to be most appropriate for this patient at this time.  All issues noted in this document were discussed and addressed.  A limited physical exam was performed with this format.  Please refer to the patient's chart for her consent to telehealth for [facility].   Evaluation Performed:  Follow-up visit  This visit type was conducted due to national recommendations for restrictions regarding the COVID-19 Pandemic (e.g. social distancing).  This format is felt to be most appropriate for this patient at this time.  All issues noted in this document were discussed and addressed.  No physical exam was performed (except for noted visual exam findings with Video Visits).  Please refer to the patient's chart (MyChart message for video visits and phone note for telephone visits) for the patient's consent to telehealth for [facility].  Date:  04/15/2019   ID:  Tanya Ho, DOB [birth year], MRN [id number]  Patient Location:  Home  Provider location:   Home  PCP:  Shon Baton, MD  Cardiologist:  Servando Snare Nahser Electrophysiologist:  None   Chief Complaint:  Follow up  History of Present Illness:    Tanya Ho is a 85 y.o. female who presents via audio/video conferencing for a telehealth visit today.  Seen for Dr. Acie Fredrickson.Former patient of Dr. Susa Simmonds.Primarily follows with me.    She has known mild/moderate CAD (maintained on chronic Plavix) from cardiac cath back in 2007, HTN, HLD, palpitations, obesity, OA, hiatal hernia, GERD. She has her labs checked by Dr. Virgina Jock.   I  saw her back in June of 2016. Her husband had just passed away earlier that month. I saw her back shortly thereafter. She has had issues with grief/coping. She has beengetting more forgetful. Has remained pretty depressed. She has tended to have issues with polypharmacy. She has had intermittent chest pain - has not tolerated Imdur. Remains anxious as a general rule.  When seen in March of 2019 -found to have new onset AF - started on Eliquis.Still pretty anxious and fearful - this has been chronic - worse with her husband's death.  She has had intermittent bouts of chest pain/indigestion. She has not tolerated Imdur in the past.Last seen in October of 2019 - cardiac status ok - more limited by back pain. Had gotten more unsteady. No longer on her statin.   The patient does not have symptoms concerning for COVID-19 infection (fever, chills, cough, or new shortness of breath).   Seen today via Doximity video on her daughter's phone. Tanya Ho has consented for this visit. She will be having her labs with Dr. Virgina Jock in June. She has been on long standing Zantac - she is now going to finish that up and switch over to Pepcid and Protonix due to the recall but she notes Dr. Virgina Jock really was not concerned about this due to her age - I agree. She continues to have issues with not sleeping. Few spells of hard to get her breath and has had noted her heart was beating fast. Last spell was about 2 weeks ago - BP was elevated 174/128 (?  accurate). HR was in the 50's. Today BP is 134/85 with HR 65.  She has had a few spells of where she felt like she had AF - had to call her family during the night - used extra Metoprolol. She is staying home. She still has lots of back issues - this is chronic - she will use heating pad - not using NSAID but will use an occasional baby aspirin. Overall she says she has been a "good girl".   Past Medical History:  Diagnosis Date  . Back pain   . Bruises easily   . Coronary artery  disease    NONOBSTRUCTIVE  . Fatigue   . Hyperlipidemia   . Hypertension   . Knee pain   . OA (osteoarthritis)   . Obesity   . Palpitations   . SOB (shortness of breath)    Past Surgical History:  Procedure Laterality Date  . BREAST BIOPSY     LEFT BREAST  . CARDIAC CATHETERIZATION  05/11/2006   OVERALL CARDIAC SIZE AND SILHOUETTE ARE NORMAL. EF ESTIMATED 60%  . KIDNEY STONES  1982  . KNEE ARTHROSCOPY  09/22/2008  . VAGINAL HYSTERECTOMY       Current Meds  Medication Sig  . ALPRAZolam (XANAX) 1 MG tablet Take 1 mg by mouth at bedtime as needed.  Marland Kitchen apixaban (ELIQUIS) 5 MG TABS tablet Take 1 tablet (5 mg total) by mouth 2 (two) times daily.  . ergocalciferol (VITAMIN D2) 50000 UNITS capsule Take 50,000 Units by mouth once a week.    . famotidine (PEPCID) 20 MG tablet Take 20 mg by mouth daily.   . Ibuprofen (ADVIL PO) Take 400 mg by mouth as needed.   . metoprolol (LOPRESSOR) 100 MG tablet Take 1 tablet (100 mg total) by mouth 2 (two) times daily.  . nitroGLYCERIN (NITROSTAT) 0.4 MG SL tablet Place 1 tablet (0.4 mg total) under the tongue every 5 (five) minutes as needed for chest pain.  . pantoprazole (PROTONIX) 40 MG tablet Take 40 mg by mouth daily.   . traMADol (ULTRAM) 50 MG tablet TAKE 1-2 TABLETS BY MOUTH EVERY 6-8 HOURS AS NEEDED FOR PAIN  . triamterene-hydrochlorothiazide (DYAZIDE) 37.5-25 MG per capsule Take 1 capsule by mouth every morning.       Allergies:   Codeine; Demerol; and Morphine and related   Social History   Tobacco Use  . Smoking status: Never Smoker  . Smokeless tobacco: Never Used  Substance Use Topics  . Alcohol use: No  . Drug use: No     Family Hx: The patient's family history includes Heart attack in her father; Heart disease in her mother; Stroke in her mother.  ROS:   Please see the history of present illness.   All other systems reviewed are negative.    Objective:    Vital Signs:  BP 134/85   Pulse 65   Wt 193 lb 9.6 oz (87.8  kg)   BMI 31.25 kg/m    Wt Readings from Last 3 Encounters:  04/15/19 193 lb 9.6 oz (87.8 kg)  10/15/18 197 lb 6.4 oz (89.5 kg)  07/16/18 200 lb 6.4 oz (90.9 kg)    Alert female in no acute distress. Not short of breath with conversation. Appropriate in responses. She has lost a few pounds.    Labs/Other Tests and Data Reviewed:    Lab Results  Component Value Date   WBC 9.7 04/16/2018   HGB 14.4 04/16/2018   HCT 42.5 04/16/2018  PLT 235 04/16/2018   GLUCOSE 208 (H) 04/16/2018   CHOL 187 03/04/2018   TRIG 267 (H) 03/04/2018   HDL 44 03/04/2018   LDLCALC 90 03/04/2018   ALT 20 03/04/2018   AST 23 03/04/2018   NA 141 04/16/2018   K 4.7 04/16/2018   CL 98 04/16/2018   CREATININE 1.21 (H) 04/16/2018   BUN 14 04/16/2018   CO2 24 04/16/2018   TSH 2.920 03/04/2018       BNP (last 3 results) No results for input(s): BNP in the last 8760 hours.  ProBNP (last 3 results) No results for input(s): PROBNP in the last 8760 hours.    Prior CV studies:    The following studies were reviewed today:  EchoStudy Conclusions3/2019  - Left ventricle: The cavity size was normal. Wall thickness was normal. Systolic function was normal. The estimated ejection fraction was in the range of 55% to 60%. - Mitral valve: Calcified annulus. - Right atrium: The atrium was mildly dilated. - Tricuspid valve: There was mild-moderate regurgitation. - Pulmonary arteries: PA peak pressure: 37 mm Hg (S).    ASSESSMENT & PLAN:     1. Persistent AF - she is managed with rate control and anticoagulation with Eliquis. She has had a few episodes of where she notes heart racing - does not sound like it correlates to what is on her BP machine - for now, no changes with her current regimen.  No changes made today.   2. High risk medicine -no problems noted. No bleeding or excessive bruising noted. She will be having labs in June with Dr. Virgina Jock.   3. Situational stress/grief - always  an issue.I do not think this will change.   4. CAD - she has been managed medically - she has not tolerated Imdur in the past - she will use NTG on occasion. Her pattern is unchanged. Not really endorsed today.   5. HTN - BP today is ok - little lability - will follow for now.   6. HLD - no longer on her statin.   7. Advanced age. Seems more limited now by her orthopedic issues.   8. COVID-19 Education: The signs and symptoms of COVID-19 were discussed with the patient and how to seek care for testing (follow up with PCP or arrange E-visit).  The importance of social distancing, staying at home and hand hygiene were discussed today.  Patient Risk:   After full review of this patient's clinical status, I feel that they are at least moderate risk at this time.  Time:   Today, I have spent 11 minutes with the patient with telehealth technology discussing the above issues.     Medication Adjustments/Labs and Tests Ordered: Current medicines are reviewed at length with the patient today.  Concerns regarding medicines are outlined above.   Tests Ordered: No orders of the defined types were placed in this encounter.   Medication Changes: No orders of the defined types were placed in this encounter.   Disposition:  FU with me in September - will check EKG on return.    Patient is agreeable to this plan and will call if any problems develop in the interim.   Amie Critchley, NP  04/15/2019 10:30 AM    [place]

## 2019-04-15 ENCOUNTER — Other Ambulatory Visit: Payer: Self-pay

## 2019-04-15 ENCOUNTER — Encounter: Payer: Self-pay | Admitting: Nurse Practitioner

## 2019-04-15 ENCOUNTER — Telehealth (INDEPENDENT_AMBULATORY_CARE_PROVIDER_SITE_OTHER): Payer: Medicare Other | Admitting: Nurse Practitioner

## 2019-04-15 VITALS — BP 134/85 | HR 65 | Wt 193.6 lb

## 2019-04-15 DIAGNOSIS — I10 Essential (primary) hypertension: Secondary | ICD-10-CM

## 2019-04-15 DIAGNOSIS — Z7189 Other specified counseling: Secondary | ICD-10-CM

## 2019-04-15 DIAGNOSIS — I2510 Atherosclerotic heart disease of native coronary artery without angina pectoris: Secondary | ICD-10-CM | POA: Diagnosis not present

## 2019-04-15 DIAGNOSIS — I4819 Other persistent atrial fibrillation: Secondary | ICD-10-CM

## 2019-04-15 DIAGNOSIS — Z79899 Other long term (current) drug therapy: Secondary | ICD-10-CM

## 2019-04-15 NOTE — Patient Instructions (Addendum)
After Visit Summary:  We will be checking the following labs today - NONE  Lab with Dr. Virgina Jock in June    Medication Instructions:    Continue with your current medicines.    If you need a refill on your cardiac medications before your next appointment, please call your pharmacy.     Testing/Procedures To Be Arranged:  N/A  Follow-Up:   See me in September with EKG    At [facility], you and your health needs are our priority.  As part of our continuing mission to provide you with exceptional heart care, we have created designated Provider Care Teams.  These Care Teams include your primary Cardiologist (physician) and Advanced Practice Providers (APPs -  Physician Assistants and Nurse Practitioners) who all work together to provide you with the care you need, when you need it.  Special Instructions:  . Stay safe, stay home and wash your hands for at least 20 seconds! . It was good to talk with you today .  Call the [place] office at [phone] if you have any questions, problems or concerns.

## 2019-07-21 DIAGNOSIS — N390 Urinary tract infection, site not specified: Secondary | ICD-10-CM | POA: Diagnosis not present

## 2019-07-21 DIAGNOSIS — R339 Retention of urine, unspecified: Secondary | ICD-10-CM | POA: Diagnosis not present

## 2019-07-21 DIAGNOSIS — I1310 Hypertensive heart and chronic kidney disease without heart failure, with stage 1 through stage 4 chronic kidney disease, or unspecified chronic kidney disease: Secondary | ICD-10-CM | POA: Diagnosis not present

## 2019-07-21 DIAGNOSIS — E1122 Type 2 diabetes mellitus with diabetic chronic kidney disease: Secondary | ICD-10-CM | POA: Diagnosis not present

## 2019-08-18 DIAGNOSIS — R42 Dizziness and giddiness: Secondary | ICD-10-CM | POA: Diagnosis not present

## 2019-08-18 DIAGNOSIS — I1310 Hypertensive heart and chronic kidney disease without heart failure, with stage 1 through stage 4 chronic kidney disease, or unspecified chronic kidney disease: Secondary | ICD-10-CM | POA: Diagnosis not present

## 2019-08-18 DIAGNOSIS — I6521 Occlusion and stenosis of right carotid artery: Secondary | ICD-10-CM | POA: Diagnosis not present

## 2019-08-18 DIAGNOSIS — N183 Chronic kidney disease, stage 3 (moderate): Secondary | ICD-10-CM | POA: Diagnosis not present

## 2019-08-18 DIAGNOSIS — M542 Cervicalgia: Secondary | ICD-10-CM | POA: Diagnosis not present

## 2019-09-01 ENCOUNTER — Telehealth (HOSPITAL_COMMUNITY): Payer: Self-pay | Admitting: Rehabilitation

## 2019-09-01 NOTE — Telephone Encounter (Signed)
Attempted to contact patient to schedule Carotid Duplex ordered by Dr. Virgina Jock. The  Patient did not answer the call, so I left a message with my contact number requesting the patient to call back to make an appointment.

## 2019-09-02 ENCOUNTER — Ambulatory Visit: Payer: Medicare Other | Admitting: Nurse Practitioner

## 2019-09-10 ENCOUNTER — Other Ambulatory Visit: Payer: Self-pay | Admitting: Cardiovascular Disease

## 2019-09-11 NOTE — Telephone Encounter (Signed)
Pt last saw Truitt Merle, NP on 04/15/19, last labs 09/20/18 Creat 1.1, age 85, weight 87.8kg, based on specified criteria pt is on appropriate dosage of Eliquis 5mg  BID.  Will refill rx.

## 2019-09-19 DIAGNOSIS — Z23 Encounter for immunization: Secondary | ICD-10-CM | POA: Diagnosis not present

## 2019-09-23 DIAGNOSIS — M1711 Unilateral primary osteoarthritis, right knee: Secondary | ICD-10-CM | POA: Diagnosis not present

## 2019-09-23 DIAGNOSIS — M1712 Unilateral primary osteoarthritis, left knee: Secondary | ICD-10-CM | POA: Diagnosis not present

## 2019-09-23 DIAGNOSIS — M170 Bilateral primary osteoarthritis of knee: Secondary | ICD-10-CM | POA: Diagnosis not present

## 2019-09-26 DIAGNOSIS — E1122 Type 2 diabetes mellitus with diabetic chronic kidney disease: Secondary | ICD-10-CM | POA: Diagnosis not present

## 2019-09-26 DIAGNOSIS — E7849 Other hyperlipidemia: Secondary | ICD-10-CM | POA: Diagnosis not present

## 2019-09-26 DIAGNOSIS — E559 Vitamin D deficiency, unspecified: Secondary | ICD-10-CM | POA: Diagnosis not present

## 2019-09-26 DIAGNOSIS — E669 Obesity, unspecified: Secondary | ICD-10-CM | POA: Diagnosis not present

## 2019-10-03 DIAGNOSIS — I2721 Secondary pulmonary arterial hypertension: Secondary | ICD-10-CM | POA: Diagnosis not present

## 2019-10-03 DIAGNOSIS — Z0000 Encounter for general adult medical examination without abnormal findings: Secondary | ICD-10-CM | POA: Diagnosis not present

## 2019-10-03 DIAGNOSIS — R600 Localized edema: Secondary | ICD-10-CM | POA: Diagnosis not present

## 2019-10-03 DIAGNOSIS — I480 Paroxysmal atrial fibrillation: Secondary | ICD-10-CM | POA: Diagnosis not present

## 2019-10-03 DIAGNOSIS — I1310 Hypertensive heart and chronic kidney disease without heart failure, with stage 1 through stage 4 chronic kidney disease, or unspecified chronic kidney disease: Secondary | ICD-10-CM | POA: Diagnosis not present

## 2019-10-03 DIAGNOSIS — H3530 Unspecified macular degeneration: Secondary | ICD-10-CM | POA: Diagnosis not present

## 2019-10-03 DIAGNOSIS — R42 Dizziness and giddiness: Secondary | ICD-10-CM | POA: Diagnosis not present

## 2019-10-03 DIAGNOSIS — R82998 Other abnormal findings in urine: Secondary | ICD-10-CM | POA: Diagnosis not present

## 2019-10-03 DIAGNOSIS — N1831 Chronic kidney disease, stage 3a: Secondary | ICD-10-CM | POA: Diagnosis not present

## 2019-10-03 DIAGNOSIS — I2510 Atherosclerotic heart disease of native coronary artery without angina pectoris: Secondary | ICD-10-CM | POA: Diagnosis not present

## 2019-10-03 DIAGNOSIS — I10 Essential (primary) hypertension: Secondary | ICD-10-CM | POA: Diagnosis not present

## 2019-10-03 DIAGNOSIS — D692 Other nonthrombocytopenic purpura: Secondary | ICD-10-CM | POA: Diagnosis not present

## 2019-10-03 DIAGNOSIS — E785 Hyperlipidemia, unspecified: Secondary | ICD-10-CM | POA: Diagnosis not present

## 2019-10-03 DIAGNOSIS — Z1331 Encounter for screening for depression: Secondary | ICD-10-CM | POA: Diagnosis not present

## 2019-10-03 DIAGNOSIS — E1122 Type 2 diabetes mellitus with diabetic chronic kidney disease: Secondary | ICD-10-CM | POA: Diagnosis not present

## 2019-10-06 DIAGNOSIS — Z1212 Encounter for screening for malignant neoplasm of rectum: Secondary | ICD-10-CM | POA: Diagnosis not present

## 2019-10-08 DIAGNOSIS — H353131 Nonexudative age-related macular degeneration, bilateral, early dry stage: Secondary | ICD-10-CM | POA: Diagnosis not present

## 2020-01-05 NOTE — Progress Notes (Signed)
CARDIOLOGY OFFICE NOTE  Date:  01/06/2020    Tanya Ho Date of Birth: 06/12/1926 Medical Record [id number]  PCP:  Shon Baton, MD  Cardiologist:  Servando Snare Nahser  Chief Complaint  Patient presents with  . Follow-up    Seen for Dr. Acie Fredrickson    History of Present Illness: Tanya Ho is a 86 y.o. female who presents today for a work in visit. Seen for Dr. Acie Fredrickson.Former patient of Dr. Susa Simmonds.Primarily follows with me.  She has known mild/moderate CAD (previously was maintained on chronic Plavix) from cardiac cath back in Mar 16, 2006, HTN, HLD, palpitations, obesity, OA, hiatal hernia, GERD. She has her labs checked by Dr. Virgina Jock.   I have followed her thru the years. Husband died in 03-17-15 - she continues to grieve. Family spends the nights withher. She has tended to have issues with polypharmacy. She has developed AF - now on Eliquis and off Plavix. Has had chronic bouts of chest pain - has not tolerated Imdur in the past. Remains anxious as a general rule.   We did a telehealth visit back in April - several somatic issues - still not sleeping. Few spells of hard to breath but primarily limited by her back pain.   I saw her daughter yesterday who had some concerns - thus given appointment for today.   The patient does not have symptoms concerning for COVID-19 infection (fever, chills, cough, or new shortness of breath).   Comes in today. Here with Tanya Ho her daughter today. She actually looks pretty good. She remains limited by her back. She is frustrated with the pandemic and staying at home. She feels more short of breath with wearing a mask. She has lots of GERD - lots of chest pain. Lots of sleep issues. Still pretty anxious - seems worse - especially at night. She is hearing someone knock on her door or make a noise on her window - very frightening for her. Can't get back to sleep - this makes her indigestion and chest feel worse. She is taking TUMS and NTG - she admits she  probably waits too long to take these agents. She is using her Xanax - only one a day is all she is allowed. She admits she is anxious and depressed.   Past Medical History:  Diagnosis Date  . Back pain   . Bruises easily   . Coronary artery disease    NONOBSTRUCTIVE  . Fatigue   . Hyperlipidemia   . Hypertension   . Knee pain   . OA (osteoarthritis)   . Obesity   . Palpitations   . SOB (shortness of breath)     Past Surgical History:  Procedure Laterality Date  . BREAST BIOPSY     LEFT BREAST  . CARDIAC CATHETERIZATION  05/11/2006   OVERALL CARDIAC SIZE AND SILHOUETTE ARE NORMAL. EF ESTIMATED 60%  . KIDNEY STONES  1982  . KNEE ARTHROSCOPY  09/22/2008  . VAGINAL HYSTERECTOMY       Medications: Current Meds  Medication Sig  . ALPRAZolam (XANAX) 1 MG tablet Take 1 mg by mouth at bedtime as needed.  Marland Kitchen ELIQUIS 5 MG TABS tablet TAKE 1 TABLET TWICE DAILY  . ergocalciferol (VITAMIN D2) 50000 UNITS capsule Take 50,000 Units by mouth once a week.    . famotidine (PEPCID) 20 MG tablet Take 20 mg by mouth daily.   . Ibuprofen (ADVIL PO) Take 400 mg by mouth as needed.   . metoprolol (LOPRESSOR) 100  MG tablet Take 1 tablet (100 mg total) by mouth 2 (two) times daily.  . nitroGLYCERIN (NITROSTAT) 0.4 MG SL tablet Place 1 tablet (0.4 mg total) under the tongue every 5 (five) minutes as needed for chest pain.  . pantoprazole (PROTONIX) 40 MG tablet Take 40 mg by mouth daily.   . traMADol (ULTRAM) 50 MG tablet TAKE 1-2 TABLETS BY MOUTH EVERY 6-8 HOURS AS NEEDED FOR PAIN  . triamterene-hydrochlorothiazide (DYAZIDE) 37.5-25 MG per capsule Take 1 capsule by mouth every morning.       Allergies: Allergies  Allergen Reactions  . Codeine   . Demerol   . Morphine And Related     Social History: The patient  reports that she has never smoked. She has never used smokeless tobacco. She reports that she does not drink alcohol or use drugs.   Family History: The patient's family history  includes Heart attack in her father; Heart disease in her mother; Stroke in her mother.   Review of Systems: Please see the history of present illness.   All other systems are reviewed and negative.   Physical Exam: VS:  BP 128/66   Pulse 69   Ht 5\' 6"  (1.676 m)   Wt 193 lb (87.5 kg)   SpO2 96%   BMI 31.15 kg/m  .  BMI Body mass index is 31.15 kg/m.  Wt Readings from Last 3 Encounters:  01/06/20 193 lb (87.5 kg)  04/15/19 193 lb 9.6 oz (87.8 kg)  10/15/18 197 lb 6.4 oz (89.5 kg)    General: Pleasant. She looks good - younger than her stated age and alert and in no acute distress.   HEENT: Normal.  Neck: Supple, no JVD, carotid bruits, or masses noted.  Cardiac: Irregular irregular rhythm. Rate is controlled. Trace edema.  Respiratory:  Lungs are clear to auscultation bilaterally with normal work of breathing.  GI: Soft and nontender.  MS: No deformity or atrophy. Gait and ROM intact.  Skin: Warm and dry. Color is normal.  Neuro:  Strength and sensation are intact and no gross focal deficits noted.  Psych: Alert, appropriate and with normal affect.   LABORATORY DATA:  EKG:  EKG is ordered today. This demonstrates AF with controlled VR - HR is 69.  Lab Results  Component Value Date   WBC 9.7 04/16/2018   HGB 14.4 04/16/2018   HCT 42.5 04/16/2018   PLT 235 04/16/2018   GLUCOSE 208 (H) 04/16/2018   CHOL 187 03/04/2018   TRIG 267 (H) 03/04/2018   HDL 44 03/04/2018   LDLCALC 90 03/04/2018   ALT 20 03/04/2018   AST 23 03/04/2018   NA 141 04/16/2018   K 4.7 04/16/2018   CL 98 04/16/2018   CREATININE 1.21 (H) 04/16/2018   BUN 14 04/16/2018   CO2 24 04/16/2018   TSH 2.920 03/04/2018       BNP (last 3 results) No results for input(s): BNP in the last 8760 hours.  ProBNP (last 3 results) No results for input(s): PROBNP in the last 8760 hours.   Other Studies Reviewed Today:  EchoStudy Conclusions3/2019  - Left ventricle: The cavity size was normal. Wall  thickness was normal. Systolic function was normal. The estimated ejection fraction was in the range of 55% to 60%. - Mitral valve: Calcified annulus. - Right atrium: The atrium was mildly dilated. - Tricuspid valve: There was mild-moderate regurgitation. - Pulmonary arteries: PA peak pressure: 37 mm Hg (S).    ASSESSMENT & PLAN:  1. Persistent AF - managed with rate control and Eliquis - EKG ok today. Rate is ok. I think a lot of her symptoms are more driven from her anxiety and being alone at night.   2. Chronic anticoagulation - lab from October noted.   3. Anxiety and depression - long standing - maybe she needs additional agent - daughter is going to call Dr. Virgina Jock and discuss. I think this is driving most of her symptoms - especially being alone at night - no real good options for anyone to be with her.   4. Long standing grief - unfortunately, I do not see this changing.   5. CAD -she has beenmanaged medically -she has not tolerated Imdur in the past - she will use NTG on occasion. Her pattern really seems unchanged to me - I did encourage her to use her NTG earlier rather than later.   6. HLD -no longer on her statin. Not discussed.   7. Advanced age.Seems more limited now by her orthopedic issues.   8. COVID-19 Education: The signs and symptoms of COVID-19 were discussed with the patient and how to seek care for testing (follow up with PCP or arrange E-visit).  The importance of social distancing, staying at home, hand hygiene and wearing a mask when out in public were discussed today. She is getting her first COVID vaccine tomorrow.   Current medicines are reviewed with the patient today.  The patient does not have concerns regarding medicines other than what has been noted above.  The following changes have been made:  See above.  Labs/ tests ordered today include:    Orders Placed This Encounter  Procedures  . EKG 12-Lead     Disposition:    FU with me in 3 to 4 months.   Patient is agreeable to this plan and will call if any problems develop in the interim.   SignedTruitt Merle, NP  01/06/2020 2:13 PM  [facility] [address] [place], [place]  [address] Phone: [phone] Fax: [phone]

## 2020-01-06 ENCOUNTER — Encounter: Payer: Self-pay | Admitting: Nurse Practitioner

## 2020-01-06 ENCOUNTER — Ambulatory Visit (INDEPENDENT_AMBULATORY_CARE_PROVIDER_SITE_OTHER): Payer: Medicare Other | Admitting: Nurse Practitioner

## 2020-01-06 ENCOUNTER — Other Ambulatory Visit: Payer: Self-pay

## 2020-01-06 VITALS — BP 128/66 | HR 69 | Ht 66.0 in | Wt 193.0 lb

## 2020-01-06 DIAGNOSIS — I2510 Atherosclerotic heart disease of native coronary artery without angina pectoris: Secondary | ICD-10-CM

## 2020-01-06 DIAGNOSIS — I4819 Other persistent atrial fibrillation: Secondary | ICD-10-CM | POA: Diagnosis not present

## 2020-01-06 DIAGNOSIS — Z7189 Other specified counseling: Secondary | ICD-10-CM

## 2020-01-06 DIAGNOSIS — I10 Essential (primary) hypertension: Secondary | ICD-10-CM

## 2020-01-06 DIAGNOSIS — Z79899 Other long term (current) drug therapy: Secondary | ICD-10-CM

## 2020-01-06 NOTE — Patient Instructions (Addendum)
After Visit Summary:  We will be checking the following labs today - NONE   Medication Instructions:    Continue with your current medicines.    If you need a refill on your cardiac medications before your next appointment, please call your pharmacy.     Testing/Procedures To Be Arranged:  N/A  Follow-Up:   See me in about 3 to 4 months.     At [facility] you and your health needs are our priority.  As part of our continuing mission to provide you with exceptional heart care, we have created designated Provider Care Teams.  These Care Teams include your primary Cardiologist (physician) and Advanced Practice Providers (APPs -  Physician Assistants and Nurse Practitioners) who all work together to provide you with the care you need, when you need it.  Special Instructions:  . Stay safe, stay home, wash your hands for at least 20 seconds and wear a mask when out in public.  . It was good to talk with you today.  . Have Norma call Dr. Virgina Jock and talk about what is going on at night.    Call the [place] office at [phone] if you have any questions, problems or concerns.

## 2020-01-07 ENCOUNTER — Ambulatory Visit: Payer: Medicare Other | Attending: Internal Medicine

## 2020-01-07 DIAGNOSIS — Z23 Encounter for immunization: Secondary | ICD-10-CM | POA: Diagnosis not present

## 2020-01-07 NOTE — Progress Notes (Signed)
   Covid-19 Vaccination Clinic  Name:  Tanya Ho    MRN: [id number] DOB: 1926-01-11  01/07/2020  Tanya Ho was observed post Covid-19 immunization for 15 minutes without incidence. She was provided with Vaccine Information Sheet and instruction to access the V-Safe system.   Tanya Ho was instructed to call 911 with any severe reactions post vaccine: Marland Kitchen Difficulty breathing  . Swelling of your face and throat  . A fast heartbeat  . A bad rash all over your body  . Dizziness and weakness    Immunizations Administered    Name Date Dose VIS Date Route   Pfizer COVID-19 Vaccine 01/07/2020  5:58 PM 0.3 mL 11/28/2019 Intramuscular   Manufacturer: [place]   Lot: [id number]   [place]: [id number]

## 2020-01-27 ENCOUNTER — Ambulatory Visit: Payer: Medicare Other | Attending: Internal Medicine

## 2020-01-27 DIAGNOSIS — Z23 Encounter for immunization: Secondary | ICD-10-CM | POA: Insufficient documentation

## 2020-01-27 NOTE — Progress Notes (Signed)
   Covid-19 Vaccination Clinic  Name:  Tanya Ho    MRN: [id number] DOB: 05-Aug-1926  01/27/2020  [name] was observed post Covid-19 immunization for 15 minutes without incidence. She was provided with Vaccine Information Sheet and instruction to access the V-Safe system.   [name] was instructed to call 911 with any severe reactions post vaccine: Marland Kitchen Difficulty breathing  . Swelling of your face and throat  . A fast heartbeat  . A bad rash all over your body  . Dizziness and weakness    Immunizations Administered    Name Date Dose VIS Date Route   Pfizer COVID-19 Vaccine 01/27/2020 11:41 AM 0.3 mL 11/28/2019 Intramuscular   Manufacturer: [place]   Lot: [id number]   [place]: [id number]

## 2020-03-08 ENCOUNTER — Other Ambulatory Visit: Payer: Self-pay | Admitting: Cardiovascular Disease

## 2020-03-08 NOTE — Telephone Encounter (Signed)
Prescription refill request for Eliquis received.  Last office visit: Tanya Ho 01/06/2020 Scr: 1.0 09/26/2019 Age: 86 y.o. Weight: 87.5 kg   Prescription refill sent.

## 2020-03-09 ENCOUNTER — Other Ambulatory Visit: Payer: Self-pay | Admitting: *Deleted

## 2020-03-09 ENCOUNTER — Telehealth: Payer: Self-pay | Admitting: *Deleted

## 2020-03-09 MED ORDER — AMLODIPINE BESYLATE 2.5 MG PO TABS: 2.5000 mg | ORAL_TABLET | Freq: Every day | ORAL | 3 refills | Status: DC

## 2020-03-09 NOTE — Telephone Encounter (Signed)
Pt calling in today due to bp readings, could not give dates but has been going on since last ov. Bp readings are as follows:  165/106, 147/107, 145 101, 132/95.  Pt stated fatigued and leg cramps.  Denies dizziness, lightheaded, SOB,CP and falling. Stated son told pt to call [name].  Woke up numb twice and stated " If I don't move I am going to die".  Pt called son a couple of times due to indigestion, son comes over to sit with pt,  pt takes tums and does not want to take nitro. Asked pt if wanted an ov appt with [name] stated " whatever [name] thinks but does know if pt can get here". Has mentioned son Kasandra Knudsen stated maybe meds need to be changed.  Pt does remain anxious and before bye was said on phone and that would call pt back pt hung up,  This is out of character for pt.  Will send to [name] to advise.

## 2020-03-09 NOTE — Telephone Encounter (Signed)
Let's add low dose Norvasc 2.5 mg a day   Let's try to see her next week.   Tanya Ho

## 2020-03-09 NOTE — Telephone Encounter (Signed)
S/w pt is aware of [name]'s recommendations and agreeable to treatment plan. Pt will start Norvasc one tablet ( 2.5 mg ) daily sent in to requested pharmacy.  Pt will keep log of bp and bring in to ov next week along with bp cuff to check. Appt time and date given.

## 2020-03-10 NOTE — Progress Notes (Signed)
CARDIOLOGY OFFICE NOTE  Date:  03/16/2020    Tanya Ho Date of Birth: December 30, 1925 Medical Record [id number]  PCP:  Tanya Baton, MD  Cardiologist:  Tanya Ho   Chief Complaint  Patient presents with  . Follow-up    Seen for Dr. Acie Ho    History of Present Illness: Tanya Ho is a 86 y.o. female who presents today for a work in visit. Seen for Dr. Acie Ho.Former patient of Dr. Susa Ho.Primarily follows with me.  She has known mild/moderate CAD (previously was maintained on chronic Plavix) from cardiac cath back in Mar 28, 2006, HTN, HLD, palpitations, obesity, OA, hiatal hernia, GERD. She has her labs checked by Tanya Ho.   I have followed her thru the years. Husband died in 03-29-15 - she continues to grieve. Family spends the nights withher. She has tended to have issues with polypharmacy. She has developed AF - now on Eliquis and off Plavix. Has had chronic bouts of chest pain - has not tolerated Imdur in the past. Remains anxious as a general rule.   We did a telehealth visit back in April - several somatic issues - still not sleeping. Few spells of hard to breath but primarily limited by her back pain. Last seen in the office back in January - looked pretty good - still limited by her back - was frustrated with the pandemic. Lots of sleep/anxiety issues which are chronic. Using TUMS and NTG.   Called last week - lots of issues again - BP was up - I started low dose Norvasc.   The patient does not have symptoms concerning for COVID-19 infection (fever, chills, cough, or new shortness of breath).   Comes in today. Here with Tanya Ho. BP seems to be better. Chest pain is better. She had had some neck pain - this has not recurred. She does not some swelling in her legs - she does not really restrict her salt. Her BP cuff is not accurate - running about 20 points higher. She admits she is anxious. She gets very scared at night. This is chronic - I do not see this changing  unfortunately.   Past Medical History:  Diagnosis Date  . Back pain   . Bruises easily   . Coronary artery disease    NONOBSTRUCTIVE  . Fatigue   . Hyperlipidemia   . Hypertension   . Knee pain   . OA (osteoarthritis)   . Obesity   . Palpitations   . SOB (shortness of breath)     Past Surgical History:  Procedure Laterality Date  . BREAST BIOPSY     LEFT BREAST  . CARDIAC CATHETERIZATION  05/11/2006   OVERALL CARDIAC SIZE AND SILHOUETTE ARE NORMAL. EF ESTIMATED 60%  . KIDNEY STONES  1982  . KNEE ARTHROSCOPY  09/22/2008  . VAGINAL HYSTERECTOMY       Medications: Current Meds  Medication Sig  . ALPRAZolam (XANAX) 1 MG tablet Take 1 mg by mouth at bedtime as needed.  Marland Kitchen amLODipine (NORVASC) 2.5 MG tablet Take 1 tablet (2.5 mg total) by mouth daily.  Marland Kitchen ELIQUIS 5 MG TABS tablet TAKE 1 TABLET TWICE DAILY  . ergocalciferol (VITAMIN D2) 50000 UNITS capsule Take 50,000 Units by mouth once a week.    . famotidine (PEPCID) 20 MG tablet Take 20 mg by mouth daily.   . Ibuprofen (ADVIL PO) Take 400 mg by mouth as needed.   . metoprolol (LOPRESSOR) 100 MG tablet Take 1 tablet (100 mg  total) by mouth 2 (two) times daily.  . nitroGLYCERIN (NITROSTAT) 0.4 MG SL tablet Place 1 tablet (0.4 mg total) under the tongue every 5 (five) minutes as needed for chest pain.  . pantoprazole (PROTONIX) 40 MG tablet Take 40 mg by mouth daily.   . traMADol (ULTRAM) 50 MG tablet TAKE 1-2 TABLETS BY MOUTH EVERY 6-8 HOURS AS NEEDED FOR PAIN  . triamterene-hydrochlorothiazide (DYAZIDE) 37.5-25 MG per capsule Take 1 capsule by mouth every morning.       Allergies: Allergies  Allergen Reactions  . Codeine   . Demerol   . Morphine And Related     Social History: The patient  reports that she has never smoked. She has never used smokeless tobacco. She reports that she does not drink alcohol or use drugs.   Family History: The patient's family history includes Heart attack in her father; Heart disease  in her mother; Stroke in her mother.   Review of Systems: Please see the history of present illness.   All other systems are reviewed and negative.   Physical Exam: VS:  BP 126/70   Pulse 62   Ht 5\' 6"  (1.676 m)   Wt 196 lb 8 oz (89.1 kg)   SpO2 96%   BMI 31.72 kg/m  .  BMI Body mass index is 31.72 kg/m.  Wt Readings from Last 3 Encounters:  03/16/20 196 lb 8 oz (89.1 kg)  01/06/20 193 lb (87.5 kg)  04/15/19 193 lb 9.6 oz (87.8 kg)    General: Pleasant. She is alert and in no acute distress.   Cardiac: Regular rate and rhythm. No murmurs, rubs, or gallops. Trace edema.  Respiratory:  Lungs are clear to auscultation bilaterally with normal work of breathing.  GI: Soft and nontender.  MS: No deformity or atrophy. Gait and ROM intact.  Skin: Warm and dry. Color is normal.  Neuro:  Strength and sensation are intact and no gross focal deficits noted.  Psych: Alert, appropriate and with normal affect.   LABORATORY DATA:  EKG:  EKG is not ordered today.    Lab Results  Component Value Date   WBC 9.7 04/16/2018   HGB 14.4 04/16/2018   HCT 42.5 04/16/2018   PLT 235 04/16/2018   GLUCOSE 208 (H) 04/16/2018   CHOL 187 03/04/2018   TRIG 267 (H) 03/04/2018   HDL 44 03/04/2018   LDLCALC 90 03/04/2018   ALT 20 03/04/2018   AST 23 03/04/2018   NA 141 04/16/2018   K 4.7 04/16/2018   CL 98 04/16/2018   CREATININE 1.21 (H) 04/16/2018   BUN 14 04/16/2018   CO2 24 04/16/2018   TSH 2.920 03/04/2018       BNP (last 3 results) No results for input(s): BNP in the last 8760 hours.  ProBNP (last 3 results) No results for input(s): PROBNP in the last 8760 hours.   Other Studies Reviewed Today:  EchoStudy Conclusions3/2019  - Left ventricle: The cavity size was normal. Wall thickness was normal. Systolic function was normal. The estimated ejection fraction was in the range of 55% to 60%. - Mitral valve: Calcified annulus. - Right atrium: The atrium was mildly  dilated. - Tricuspid valve: There was mild-moderate regurgitation. - Pulmonary arteries: PA peak pressure: 37 mm Hg (S).    ASSESSMENT & PLAN:   1. Atypical chest pain - has had some elevation in her BP as well - her machine does not correlate - seems better with the addition of Norvasc. I think  anxiety is the biggest factor.   2. Persistent AF - managed with rate control and continued anticoagulation with Eliquis.   3. HTN - BP looks good - her monitor does not correlate. I think there is so much angst at the thought of her checking her BP at home - would favor not monitoring at home. Not sure she can follow thru with this.   4. Chronic anticoagulation - no problems noted.   5. Anxiety and depression - I think this is the driving factor.   4. Long standing grief - unfortunately, I do not see this changing.   5. CAD - managed medically - has not tolerated Imdur in the past - now on low dose Norvasc.   6. COVID-19 Education: The signs and symptoms of COVID-19 were discussed with the patient and how to seek care for testing (follow up with PCP or arrange E-visit).  The importance of social distancing, staying at home, hand hygiene and wearing a mask when out in public were discussed today. She has been vaccinated.   Current medicines are reviewed with the patient today.  The patient does not have concerns regarding medicines other than what has been noted above.  The following changes have been made:  See above.  Labs/ tests ordered today include:   No orders of the defined types were placed in this encounter.    Disposition:   FU with me as planned in May.    Patient is agreeable to this plan and will call if any problems develop in the interim.   SignedTruitt Merle, NP  03/16/2020 12:15 PM  [facility] [address] [place], [place]  [address] Phone: [phone] Fax: [phone]

## 2020-03-16 ENCOUNTER — Other Ambulatory Visit: Payer: Self-pay

## 2020-03-16 ENCOUNTER — Encounter: Payer: Self-pay | Admitting: Nurse Practitioner

## 2020-03-16 ENCOUNTER — Ambulatory Visit (INDEPENDENT_AMBULATORY_CARE_PROVIDER_SITE_OTHER): Payer: Medicare Other | Admitting: Nurse Practitioner

## 2020-03-16 VITALS — BP 126/70 | HR 62 | Ht 66.0 in | Wt 196.5 lb

## 2020-03-16 DIAGNOSIS — Z7189 Other specified counseling: Secondary | ICD-10-CM

## 2020-03-16 DIAGNOSIS — I4819 Other persistent atrial fibrillation: Secondary | ICD-10-CM

## 2020-03-16 DIAGNOSIS — I10 Essential (primary) hypertension: Secondary | ICD-10-CM

## 2020-03-16 DIAGNOSIS — I2510 Atherosclerotic heart disease of native coronary artery without angina pectoris: Secondary | ICD-10-CM

## 2020-03-16 DIAGNOSIS — Z79899 Other long term (current) drug therapy: Secondary | ICD-10-CM

## 2020-03-16 NOTE — Patient Instructions (Addendum)
After Visit Summary:  We will be checking the following labs today - NONE   Medication Instructions:    Continue with your current medicines.    If you need a refill on your cardiac medications before your next appointment, please call your pharmacy.     Testing/Procedures To Be Arranged:  N/A  Follow-Up:   See me as planned in May     At [facility], you and your health needs are our priority.  As part of our continuing mission to provide you with exceptional heart care, we have created designated Provider Care Teams.  These Care Teams include your primary Cardiologist (physician) and Advanced Practice Providers (APPs -  Physician Assistants and Nurse Practitioners) who all work together to provide you with the care you need, when you need it.  Special Instructions:  . Stay safe, stay home, wash your hands for at least 20 seconds and wear a mask when out in public.  . It was good to talk with you today.    Call the [place] office at [phone] if you have any questions, problems or concerns.

## 2020-04-05 DIAGNOSIS — I2510 Atherosclerotic heart disease of native coronary artery without angina pectoris: Secondary | ICD-10-CM | POA: Diagnosis not present

## 2020-04-05 DIAGNOSIS — N1831 Chronic kidney disease, stage 3a: Secondary | ICD-10-CM | POA: Diagnosis not present

## 2020-04-05 DIAGNOSIS — D692 Other nonthrombocytopenic purpura: Secondary | ICD-10-CM | POA: Diagnosis not present

## 2020-04-05 DIAGNOSIS — R600 Localized edema: Secondary | ICD-10-CM | POA: Diagnosis not present

## 2020-04-05 DIAGNOSIS — I1310 Hypertensive heart and chronic kidney disease without heart failure, with stage 1 through stage 4 chronic kidney disease, or unspecified chronic kidney disease: Secondary | ICD-10-CM | POA: Diagnosis not present

## 2020-04-05 DIAGNOSIS — I2721 Secondary pulmonary arterial hypertension: Secondary | ICD-10-CM | POA: Diagnosis not present

## 2020-04-05 DIAGNOSIS — I480 Paroxysmal atrial fibrillation: Secondary | ICD-10-CM | POA: Diagnosis not present

## 2020-04-05 DIAGNOSIS — E559 Vitamin D deficiency, unspecified: Secondary | ICD-10-CM | POA: Diagnosis not present

## 2020-04-05 DIAGNOSIS — R339 Retention of urine, unspecified: Secondary | ICD-10-CM | POA: Diagnosis not present

## 2020-04-05 DIAGNOSIS — R42 Dizziness and giddiness: Secondary | ICD-10-CM | POA: Diagnosis not present

## 2020-04-05 DIAGNOSIS — E1122 Type 2 diabetes mellitus with diabetic chronic kidney disease: Secondary | ICD-10-CM | POA: Diagnosis not present

## 2020-04-05 DIAGNOSIS — F4320 Adjustment disorder, unspecified: Secondary | ICD-10-CM | POA: Diagnosis not present

## 2020-04-07 NOTE — Progress Notes (Signed)
CARDIOLOGY OFFICE NOTE  Date:  04/20/2020    Tanya Ho Date of Birth: [birth year] Medical Record [id number]  PCP:  Tanya Baton, MD  Cardiologist:  Tanya Ho  Chief Complaint  Patient presents with  . Follow-up    History of Present Illness: Tanya Ho is a 86 y.o. female who presents today for a follow up visit. Seen for Dr. Acie Ho.Former patient of Dr. Susa Ho.Primarily follows with me.  She has known mild/moderate CAD (previously wasmaintained on chronic Plavix) from cardiac cath back in Mar 29, 2006, HTN, HLD, palpitations, obesity, OA, hiatal hernia, GERD. She has her labs checked by Tanya Ho.  I have followed her thru the years. Husband died in 03/30/2015 - she continues to grieve. Family spends the nights withher. She has tended to have issues with polypharmacy. She has developed AF - now on Eliquisand off Plavix. Has had chronic bouts of chest pain -has not tolerated Imdur in the past.Remains anxious as a general rule.   We did a telehealth visit back in April - several somatic issues - still not sleeping. Few spells of hard to breath but primarily limited by her back pain. Last seen in the office back in January - looked pretty good - still limited by her back - was frustrated with the pandemic. Lots of sleep/anxiety issues which are chronic. Using TUMS and NTG. Seen as a work in last month after calling with lots of issues - I had added Norvasc - BP was better at follow up along with her chest pain. BP cuff was not correlating. Remains very anxious and scared as a general rule.   The patient does not have symptoms concerning for COVID-19 infection (fever, chills, cough, or new shortness of breath).   Comes in today. Here with Tanya Ho her daughter. Seems to be about the same. No real chest pain. Sleep pattern is still bad. Nervousness continues. She does seem to be getting out more - fatigued afterward and with lots of back and knee pain. BP is fine. She has seen  Tanya Ho since her visit with me.    Past Medical History:  Diagnosis Date  . Back pain   . Bruises easily   . Coronary artery disease    NONOBSTRUCTIVE  . Fatigue   . Hyperlipidemia   . Hypertension   . Knee pain   . OA (osteoarthritis)   . Obesity   . Palpitations   . SOB (shortness of breath)     Past Surgical History:  Procedure Laterality Date  . BREAST BIOPSY     LEFT BREAST  . CARDIAC CATHETERIZATION  05/11/2006   OVERALL CARDIAC SIZE AND SILHOUETTE ARE NORMAL. EF ESTIMATED 60%  . KIDNEY STONES  1982  . KNEE ARTHROSCOPY  09/22/2008  . VAGINAL HYSTERECTOMY       Medications: Current Meds  Medication Sig  . ALPRAZolam (XANAX) 1 MG tablet Take 1 mg by mouth at bedtime as needed.  Marland Kitchen amLODipine (NORVASC) 2.5 MG tablet Take 1 tablet (2.5 mg total) by mouth daily.  Marland Kitchen ELIQUIS 5 MG TABS tablet TAKE 1 TABLET TWICE DAILY  . ergocalciferol (VITAMIN D2) 50000 UNITS capsule Take 50,000 Units by mouth once a week.    . famotidine (PEPCID) 20 MG tablet Take 20 mg by mouth daily.   . Ibuprofen (ADVIL PO) Take 400 mg by mouth as needed.   . metoprolol (LOPRESSOR) 100 MG tablet Take 1 tablet (100 mg total) by mouth 2 (two) times daily.  Marland Kitchen  nitroGLYCERIN (NITROSTAT) 0.4 MG SL tablet Place 1 tablet (0.4 mg total) under the tongue every 5 (five) minutes as needed for chest pain.  . traMADol (ULTRAM) 50 MG tablet TAKE 1-2 TABLETS BY MOUTH EVERY 6-8 HOURS AS NEEDED FOR PAIN  . triamterene-hydrochlorothiazide (DYAZIDE) 37.5-25 MG per capsule Take 1 capsule by mouth every morning.       Allergies: Allergies  Allergen Reactions  . Codeine   . Demerol   . Morphine And Related     Social History: The patient  reports that she has never smoked. She has never used smokeless tobacco. She reports that she does not drink alcohol or use drugs.   Family History: The patient's family history includes Heart attack in her father; Heart disease in her mother; Stroke in her mother.   Review  of Systems: Please see the history of present illness.   All other systems are reviewed and negative.   Physical Exam: VS:  BP 134/86   Pulse 73   Ht 5' 5.5" (1.664 m)   Wt 196 lb 12.8 oz (89.3 kg)   SpO2 99%   BMI 32.25 kg/m  .  BMI Body mass index is 32.25 kg/m.  Wt Readings from Last 3 Encounters:  04/20/20 196 lb 12.8 oz (89.3 kg)  03/16/20 196 lb 8 oz (89.1 kg)  01/06/20 193 lb (87.5 kg)    General: Alert and in no acute distress.   Cardiac: Regular rate and rhythm. No murmurs, rubs, or gallops. Trace pedal edema.  Respiratory:  Lungs are clear to auscultation bilaterally with normal work of breathing.  GI: Soft and nontender.  MS: No deformity or atrophy. Gait and ROM intact.  Skin: Warm and dry. Color is normal.  Neuro:  Strength and sensation are intact and no gross focal deficits noted.  Psych: Alert, appropriate and with normal affect.   LABORATORY DATA:  EKG:  EKG is not ordered today.   Lab Results  Component Value Date   WBC 9.7 04/16/2018   HGB 14.4 04/16/2018   HCT 42.5 04/16/2018   PLT 235 04/16/2018   GLUCOSE 208 (H) 04/16/2018   CHOL 187 03/04/2018   TRIG 267 (H) 03/04/2018   HDL 44 03/04/2018   LDLCALC 90 03/04/2018   ALT 20 03/04/2018   AST 23 03/04/2018   NA 141 04/16/2018   K 4.7 04/16/2018   CL 98 04/16/2018   CREATININE 1.21 (H) 04/16/2018   BUN 14 04/16/2018   CO2 24 04/16/2018   TSH 2.920 03/04/2018       BNP (last 3 results) No results for input(s): BNP in the last 8760 hours.  ProBNP (last 3 results) No results for input(s): PROBNP in the last 8760 hours.   Other Studies Reviewed Today:  EchoStudy Conclusions3/2019  - Left ventricle: The cavity size was normal. Wall thickness was normal. Systolic function was normal. The estimated ejection fraction was in the range of 55% to 60%. - Mitral valve: Calcified annulus. - Right atrium: The atrium was mildly dilated. - Tricuspid valve: There was mild-moderate  regurgitation. - Pulmonary arteries: PA peak pressure: 37 mm Hg (S).    ASSESSMENT & PLAN:   1. Atypical chest pain - on low dose Norvasc - this seems better but I still think that anxiety is the driving factor.   2. Persistent AF - managed with rate control and continued anticoagulation with Eliquis.   3. HTN - BP fine. Have asked her to stop monitoring due to too much angst.  4. Chronic anticoagulation - no problems noted.   5. Anxiety and depression - I think this is the driving factor.   4. Long standing grief - unfortunately, I do not see this changing.  5. CAD - managed medically - has not tolerated Imdur in the past - now on low dose Norvasc.   6.  COVID-19 Education: The signs and symptoms of COVID-19 were discussed with the patient and how to seek care for testing (follow up with PCP or arrange E-visit).  The importance of social distancing, staying at home, hand hygiene and wearing a mask when out in public were discussed today.  Current medicines are reviewed with the patient today.  The patient does not have concerns regarding medicines other than what has been noted above.  The following changes have been made:  See above.  Labs/ tests ordered today include:   No orders of the defined types were placed in this encounter.    Disposition:   FU with me in 4 months. For now, no changes.    Patient is agreeable to this plan and will call if any problems develop in the interim.   SignedTruitt Merle, NP  04/20/2020 1:03 PM  [facility] [address] [place], [place]  [address] Phone: [phone] Fax: [phone]

## 2020-04-20 ENCOUNTER — Other Ambulatory Visit: Payer: Self-pay

## 2020-04-20 ENCOUNTER — Ambulatory Visit (INDEPENDENT_AMBULATORY_CARE_PROVIDER_SITE_OTHER): Payer: Medicare Other | Admitting: Nurse Practitioner

## 2020-04-20 ENCOUNTER — Encounter: Payer: Self-pay | Admitting: Nurse Practitioner

## 2020-04-20 VITALS — BP 134/86 | HR 73 | Ht 65.5 in | Wt 196.8 lb

## 2020-04-20 DIAGNOSIS — Z7189 Other specified counseling: Secondary | ICD-10-CM

## 2020-04-20 DIAGNOSIS — Z79899 Other long term (current) drug therapy: Secondary | ICD-10-CM | POA: Diagnosis not present

## 2020-04-20 DIAGNOSIS — I480 Paroxysmal atrial fibrillation: Secondary | ICD-10-CM | POA: Diagnosis not present

## 2020-04-20 DIAGNOSIS — I10 Essential (primary) hypertension: Secondary | ICD-10-CM | POA: Diagnosis not present

## 2020-04-20 DIAGNOSIS — I4819 Other persistent atrial fibrillation: Secondary | ICD-10-CM | POA: Diagnosis not present

## 2020-04-20 DIAGNOSIS — I2510 Atherosclerotic heart disease of native coronary artery without angina pectoris: Secondary | ICD-10-CM | POA: Diagnosis not present

## 2020-04-20 NOTE — Patient Instructions (Addendum)
After Visit Summary:  We will be checking the following labs today - NONE   Medication Instructions:    Continue with your current medicines.    If you need a refill on your cardiac medications before your next appointment, please call your pharmacy.     Testing/Procedures To Be Arranged:  N/A  Follow-Up:   See me in 4 months    At [facility], you and your health needs are our priority.  As part of our continuing mission to provide you with exceptional heart care, we have created designated Provider Care Teams.  These Care Teams include your primary Cardiologist (physician) and Advanced Practice Providers (APPs -  Physician Assistants and Nurse Practitioners) who all work together to provide you with the care you need, when you need it.  Special Instructions:  . Stay safe, stay home, wash your hands for at least 20 seconds and wear a mask when out in public.  . It was good to talk with you today.    Call the [facility] office at [phone] if you have any questions, problems or concerns.       

## 2020-06-01 ENCOUNTER — Other Ambulatory Visit: Payer: Self-pay | Admitting: Nurse Practitioner

## 2020-06-02 ENCOUNTER — Ambulatory Visit (INDEPENDENT_AMBULATORY_CARE_PROVIDER_SITE_OTHER): Payer: Medicare Other | Admitting: Nurse Practitioner

## 2020-06-02 ENCOUNTER — Telehealth: Payer: Self-pay | Admitting: Nurse Practitioner

## 2020-06-02 ENCOUNTER — Other Ambulatory Visit: Payer: Self-pay

## 2020-06-02 ENCOUNTER — Encounter: Payer: Self-pay | Admitting: Nurse Practitioner

## 2020-06-02 VITALS — BP 140/82 | HR 67 | Ht 65.5 in | Wt 195.8 lb

## 2020-06-02 DIAGNOSIS — F4321 Adjustment disorder with depressed mood: Secondary | ICD-10-CM | POA: Diagnosis not present

## 2020-06-02 DIAGNOSIS — F419 Anxiety disorder, unspecified: Secondary | ICD-10-CM | POA: Diagnosis not present

## 2020-06-02 DIAGNOSIS — I4819 Other persistent atrial fibrillation: Secondary | ICD-10-CM | POA: Diagnosis not present

## 2020-06-02 DIAGNOSIS — I2510 Atherosclerotic heart disease of native coronary artery without angina pectoris: Secondary | ICD-10-CM

## 2020-06-02 DIAGNOSIS — I10 Essential (primary) hypertension: Secondary | ICD-10-CM | POA: Diagnosis not present

## 2020-06-02 DIAGNOSIS — F32A Depression, unspecified: Secondary | ICD-10-CM

## 2020-06-02 DIAGNOSIS — F329 Major depressive disorder, single episode, unspecified: Secondary | ICD-10-CM | POA: Diagnosis not present

## 2020-06-02 NOTE — Telephone Encounter (Signed)
Patient's daughter called answering service. Received the following message:  Having some pain under left arm, also aching when she turns over in bed, all this began on Sunday also some pain in chest comes and goes wants an appt today

## 2020-06-02 NOTE — Patient Instructions (Addendum)
After Visit Summary:  We will be checking the following labs today - NONE   Medication Instructions:    Continue with your current medicines.    If you need a refill on your cardiac medications before your next appointment, please call your pharmacy.     Testing/Procedures To Be Arranged:  N/A  Follow-Up:   See me back as planned    At [facility], you and your health needs are our priority.  As part of our continuing mission to provide you with exceptional heart care, we have created designated Provider Care Teams.  These Care Teams include your primary Cardiologist (physician) and Advanced Practice Providers (APPs -  Physician Assistants and Nurse Practitioners) who all work together to provide you with the care you need, when you need it.  Special Instructions:  . Stay safe, wash your hands for at least 20 seconds and wear a mask when needed.  . It was good to talk with you today.    Call the [place] office at [phone] if you have any questions, problems or concerns.

## 2020-06-02 NOTE — Telephone Encounter (Signed)
S/w pt's daughter and pt will come in today to see Truitt Merle, NP.

## 2020-06-02 NOTE — Progress Notes (Signed)
CARDIOLOGY OFFICE NOTE  Date:  06/02/2020    Tanya Ho Date of Birth: [birth year] Medical Record #[id number]  PCP:  Shon Baton, MD  Cardiologist:  Servando Snare Nahser   Chief Complaint  Patient presents with  . Chest Pain    History of Present Illness: Tanya Ho is a 86 y.o. female who presents today for a work in visit. Seen for Dr. Acie Fredrickson.Former patient of Dr. Susa Simmonds.Primarily follows with me.  She has known mild/moderate CAD (previously wasmaintained on chronic Plavix) from cardiac cath back in 04/07/06, HTN, HLD, palpitations, obesity, OA, hiatal hernia, GERD. She has her labs checked by Dr. Virgina Jock.  I have followed her thru the years. Husband died in Apr 08, 2015 - she continues to grieve. Family spends the nights withher. She has tended to have issues with polypharmacy. She has developed AF - now on Eliquisand off Plavix. Has had chronic bouts of chest pain -has not tolerated Imdur in the past.Remains anxious as a general rule.   We did a telehealth visit back in April - several somatic issues - still not sleeping. Few spells of hard to breath but primarily limited by her back pain.Last seen in the office back in January - looked pretty good - still limited by her back - was frustrated with the pandemic. Lots of sleep/anxiety issues which are chronic. Using TUMS and NTG. Seen as a work in last month after calling with lots of issues - I had added Norvasc - BP was better at follow up along with her chest pain. BP cuff was not correlating. Remains very anxious and scared as a general rule. Last seen in early May - she seemed about the same.    Phone call today - "Patient's daughter called answering service. Received the following message: Having some pain under left arm, also aching when she turns over in bed, all this began on April 08, 2023 also some pain in chest comes and goes wants an appt today". Thus added to my schedule for today.   The patient does not have symptoms  concerning for COVID-19 infection (fever, chills, cough, or new shortness of breath).   Comes in today. Here with Tanya Ho. Normal augments the history. Has not felt well since 2023/04/08 - more spells of chest pain, arm pain - somewhat atypical. More weak. Anxious. Woke up this morning - could not turn over in the bed - felt like her right side was paralyzed. She could move her right arm and it was not paralyzed. She was able to get up and take her BP. She is weak. She is wobbly. Has had pain under her left arm - more so in the last few days. Feels lifeless. Using Tylenol. Took NTG yesterday - may have helped she is not sure. Tanya Ho says she did this on 04/08/2023. BP in the 160 to 170's. She feels like this is anxiety. Tanya Ho feels this is anxiety - getting worse. Tanya Ho stayed with her Monday - she did fine that night with no issues. She is not happy with the idea of someone coming and staying with her at night. She is not happy with the idea of going to like assisted living. Very difficult situation. She is not interested in having any tests. She does not wish to go to the hospital.   Past Medical History:  Diagnosis Date  . Back pain   . Bruises easily   . Coronary artery disease    NONOBSTRUCTIVE  . Fatigue   .  Hyperlipidemia   . Hypertension   . Knee pain   . OA (osteoarthritis)   . Obesity   . Palpitations   . SOB (shortness of breath)     Past Surgical History:  Procedure Laterality Date  . BREAST BIOPSY     LEFT BREAST  . CARDIAC CATHETERIZATION  05/11/2006   OVERALL CARDIAC SIZE AND SILHOUETTE ARE NORMAL. EF ESTIMATED 60%  . KIDNEY STONES  1982  . KNEE ARTHROSCOPY  09/22/2008  . VAGINAL HYSTERECTOMY       Medications: Current Meds  Medication Sig  . ALPRAZolam (XANAX) 1 MG tablet Take 1 mg by mouth at bedtime as needed.  Marland Kitchen amLODipine (NORVASC) 2.5 MG tablet TAKE 1 TABLET BY MOUTH EVERY DAY  . ELIQUIS 5 MG TABS tablet TAKE 1 TABLET TWICE DAILY  . ergocalciferol (VITAMIN D2) 50000  UNITS capsule Take 50,000 Units by mouth once a week.    . famotidine (PEPCID) 20 MG tablet Take 20 mg by mouth daily.   . Ibuprofen (ADVIL PO) Take 400 mg by mouth as needed.   . metoprolol (LOPRESSOR) 100 MG tablet Take 1 tablet (100 mg total) by mouth 2 (two) times daily.  . nitroGLYCERIN (NITROSTAT) 0.4 MG SL tablet Place 1 tablet (0.4 mg total) under the tongue every 5 (five) minutes as needed for chest pain.  . traMADol (ULTRAM) 50 MG tablet TAKE 1-2 TABLETS BY MOUTH EVERY 6-8 HOURS AS NEEDED FOR PAIN  . triamterene-hydrochlorothiazide (DYAZIDE) 37.5-25 MG per capsule Take 1 capsule by mouth every morning.       Allergies: Allergies  Allergen Reactions  . Codeine   . Demerol   . Morphine And Related     Social History: The patient  reports that she has never smoked. She has never used smokeless tobacco. She reports that she does not drink alcohol and does not use drugs.   Family History: The patient's family history includes Heart attack in her father; Heart disease in her mother; Stroke in her mother.   Review of Systems: Please see the history of present illness.   All other systems are reviewed and negative.   Physical Exam: VS:  BP 140/82   Pulse 67   Ht 5' 5.5" (1.664 m)   Wt 195 lb 12.8 oz (88.8 kg)   SpO2 97%   BMI 32.09 kg/m  .  BMI Body mass index is 32.09 kg/m.  Wt Readings from Last 3 Encounters:  06/02/20 195 lb 12.8 oz (88.8 kg)  04/20/20 196 lb 12.8 oz (89.3 kg)  03/16/20 196 lb 8 oz (89.1 kg)    General: Elderly. Alert and in no acute distress.   HEENT: Normal.  Neck: Supple, no JVD, carotid bruits, or masses noted.  Cardiac: Irregular irregular rhythm. Her rate is ok. Trace edema.  Respiratory:  Lungs are clear to auscultation bilaterally with normal work of breathing.  GI: Soft and nontender.  MS: No deformity or atrophy. Gait and ROM intact.  Skin: Warm and dry. Color is normal.  Neuro:  Strength and sensation are intact and no gross focal  deficits noted.  Psych: Alert, appropriate and with normal affect.   LABORATORY DATA:  EKG:  EKG is ordered today.  Personally reviewed by me. This demonstrates atrial fib with controlled VR. No acute changes.   Lab Results  Component Value Date   WBC 9.7 04/16/2018   HGB 14.4 04/16/2018   HCT 42.5 04/16/2018   PLT 235 04/16/2018   GLUCOSE 208 (H) 04/16/2018  CHOL 187 03/04/2018   TRIG 267 (H) 03/04/2018   HDL 44 03/04/2018   LDLCALC 90 03/04/2018   ALT 20 03/04/2018   AST 23 03/04/2018   NA 141 04/16/2018   K 4.7 04/16/2018   CL 98 04/16/2018   CREATININE 1.21 (H) 04/16/2018   BUN 14 04/16/2018   CO2 24 04/16/2018   TSH 2.920 03/04/2018       BNP (last 3 results) No results for input(s): BNP in the last 8760 hours.  ProBNP (last 3 results) No results for input(s): PROBNP in the last 8760 hours.   Other Studies Reviewed Today:  EchoStudy Conclusions3/2019  - Left ventricle: The cavity size was normal. Wall thickness was normal. Systolic function was normal. The estimated ejection fraction was in the range of 55% to 60%. - Mitral valve: Calcified annulus. - Right atrium: The atrium was mildly dilated. - Tricuspid valve: There was mild-moderate regurgitation. - Pulmonary arteries: PA peak pressure: 37 mm Hg (S).    ASSESSMENT & PLAN:   1. Continued bouts of anxiety/weakness/atypical chest pain - seems driven by anxiety - she does better with someone staying with her - she has not tolerated medicines well. She does not want testing or hospitalization. I think our options are very limited. EKG is not acute. BP is ok here today. She needs to look into options of getting someone to stay with her.   2. Persistent AF - rate is ok - she remains on Eliquis.   3. HTN - BP is ok here today - little higher in the setting of anxiety.   4. Long standing anxiety and depression - I still think this is the driving factor. I am not sure I can help with this.    5. Long standing grief. I do not see this changing.   6. CAD - managed medically - she has not tolerated Imdur. She does not wish to have testing. I think our options are limited.   Current medicines are reviewed with the patient today.  The patient does not have concerns regarding medicines other than what has been noted above.  The following changes have been made:  See above.  Labs/ tests ordered today include:    Orders Placed This Encounter  Procedures  . EKG 12-Lead     Disposition:   FU with me as planned.   Patient is agreeable to this plan and will call if any problems develop in the interim.   SignedTruitt Merle, NP  06/02/2020 3:54 PM  [facility] [address] [place], [place]  [address] Phone: [phone] Fax: [phone]

## 2020-06-29 ENCOUNTER — Other Ambulatory Visit: Payer: Self-pay | Admitting: *Deleted

## 2020-06-29 ENCOUNTER — Telehealth: Payer: Self-pay | Admitting: *Deleted

## 2020-06-29 NOTE — Telephone Encounter (Signed)
Ok to stop the Norvasc - this was very low dose - so not able to cut dose back.  Has not tolerated Imdur in the past.   I think our options are pretty limited.  Will stop the Norvasc and see how she does.   Tanya Ho

## 2020-06-29 NOTE — Telephone Encounter (Signed)
S/w pt is aware of [name]'s recommendations will D/C amlodipine due to swelling.  Pt took bp after last phone conversation 117/80  HR 55.

## 2020-06-29 NOTE — Telephone Encounter (Signed)
Pt calling in due to LE edema due to amlodipine.  Pt would like a different medication.  Pt stated no chest pain, racing heart, or palpitations. Will send to [name] to advise.

## 2020-08-18 ENCOUNTER — Telehealth: Payer: Self-pay | Admitting: Nurse Practitioner

## 2020-08-18 NOTE — Telephone Encounter (Signed)
1. What dental office are you calling from? [facility]  2. What is your office phone number? [phone]  3. What is your fax number? [phone]  4. What type of procedure is the patient having performed? Don't know, may need to pull a tooth  5. What date is procedure scheduled or is the patient there now? Today at 9:20 am (if the patient is at the dentist's office question goes to their cardiologist if he/she is in the office.  If not, question should go to the DOD).   6. What is your question (ex. Antibiotics prior to procedure, holding medication-we need to know how long dentist wants pt to hold med)? Office needs to know whether she will need to hold her eliquis and if she needs to take a pre med

## 2020-08-18 NOTE — Telephone Encounter (Signed)
S/w [place], office does not hold Eliquis for 1 tooth extraction.  Pt does not get prophylaxis prior to any dental procedure. Advised with Truitt Merle, NP.

## 2020-08-30 NOTE — Progress Notes (Signed)
CARDIOLOGY OFFICE NOTE  Date:  09/08/2020    Tanya Ho Date of Birth: 04/07/1926 Medical Record #[id number]  PCP:  Shon Baton, MD  Cardiologist:  Servando Snare Nahser   Chief Complaint  Patient presents with  . Follow-up    Seen for Dr. Acie Fredrickson    History of Present Illness: Tanya Ho is a 86 y.o. female who presents today for a follow up visit. Seen for Dr. Acie Fredrickson.Former patient of Dr. Susa Simmonds.Primarily follows with me.  She has known mild/moderate CAD (previously wasmaintained on chronic Plavix) from cardiac cath back in 03-23-2006, HTN, HLD, palpitations, obesity, OA, hiatal hernia, GERD. She has her labs checked by Dr. Virgina Jock.  I have followed her thru the years. Husband died in 2015/03/24 - she continues to grieve. Family spends the nights withher. She has tended to have issues with polypharmacy. She has developed AF - now on Eliquisand off Plavix. Has had chronic bouts of chest pain -has not tolerated Imdur in the past.Remains anxious as a general rule.   We did a telehealth visit back in April - several somatic issues - still not sleeping. Few spells of hard to breath but primarily limited by her back pain.Last seen in the office back in January - looked pretty good - still limited by her back - was frustrated with the pandemic. Lots of sleep/anxiety issues which are chronic. Using TUMS and NTG.Seen as a work in last month after calling with lots of issues - I had added Norvasc - BP was better at follow up along with her chest pain. BP cuff was not correlating. Remains very anxious and scared as a general rule.   Seen as a work in June - not feeling well - more spells of chest pain and arm pain - more anxious. Very sad and difficult situation. Not interested in having any tests nor to be hospitalized.   Comes in today. Here with Tanya Ho her daughter. Tanya Ho helps augment the history. She seems to be doing ok. Maybe a little better. Needs NTG refilled. Having more knee pain  - thinking of getting another shot in her knee - she has not done this in over a year or more. Tanya Ho thinks things are a little better. Still with some anxiety but perhaps better. She has had some spells of her neck hurting - wondering if her carotids are "getting blocked up". She had a test several years back thru PCP and many years ago with Dr. Doreatha Lew - it has not been updated recently. BP is ok.   Past Medical History:  Diagnosis Date  . Back pain   . Bruises easily   . Coronary artery disease    NONOBSTRUCTIVE  . Fatigue   . Hyperlipidemia   . Hypertension   . Knee pain   . OA (osteoarthritis)   . Obesity   . Palpitations   . SOB (shortness of breath)     Past Surgical History:  Procedure Laterality Date  . BREAST BIOPSY     LEFT BREAST  . CARDIAC CATHETERIZATION  05/11/2006   OVERALL CARDIAC SIZE AND SILHOUETTE ARE NORMAL. EF ESTIMATED 60%  . KIDNEY STONES  1982  . KNEE ARTHROSCOPY  09/22/2008  . VAGINAL HYSTERECTOMY       Medications: Current Meds  Medication Sig  . ALPRAZolam (XANAX) 1 MG tablet Take 1 mg by mouth at bedtime as needed.  Marland Kitchen amLODipine (NORVASC) 2.5 MG tablet   . ELIQUIS 5 MG TABS tablet TAKE  1 TABLET TWICE DAILY  . ergocalciferol (VITAMIN D2) 50000 UNITS capsule Take 50,000 Units by mouth once a week.    . metoprolol (LOPRESSOR) 100 MG tablet Take 1 tablet (100 mg total) by mouth 2 (two) times daily.  . nitroGLYCERIN (NITROSTAT) 0.4 MG SL tablet Place 1 tablet (0.4 mg total) under the tongue every 5 (five) minutes as needed for chest pain.  . traMADol (ULTRAM) 50 MG tablet TAKE 1-2 TABLETS BY MOUTH EVERY 6-8 HOURS AS NEEDED FOR PAIN  . triamterene-hydrochlorothiazide (DYAZIDE) 37.5-25 MG per capsule Take 1 capsule by mouth every morning.    . [DISCONTINUED] nitroGLYCERIN (NITROSTAT) 0.4 MG SL tablet Place 1 tablet (0.4 mg total) under the tongue every 5 (five) minutes as needed for chest pain.     Allergies: Allergies  Allergen Reactions  . Codeine    . Demerol   . Morphine And Related     Social History: The patient  reports that she has never smoked. She has never used smokeless tobacco. She reports that she does not drink alcohol and does not use drugs.   Family History: The patient's family history includes Heart attack in her father; Heart disease in her mother; Stroke in her mother.   Review of Systems: Please see the history of present illness.   All other systems are reviewed and negative.   Physical Exam: VS:  BP 118/76   Pulse 73   Ht 5\' 6"  (1.676 m)   Wt 196 lb 6.4 oz (89.1 kg)   SpO2 94%   BMI 31.70 kg/m  .  BMI Body mass index is 31.7 kg/m.  Wt Readings from Last 3 Encounters:  09/08/20 196 lb 6.4 oz (89.1 kg)  06/02/20 195 lb 12.8 oz (88.8 kg)  04/20/20 196 lb 12.8 oz (89.3 kg)    General: Pleasant. Elderly. Alert and in no acute distress.   HEENT: Normal.  Neck: Supple, no JVD, carotid bruits, or masses noted.  Cardiac: Irregular irregular rhythm. Her rate is fine.  No edema.  Respiratory:  Lungs are clear to auscultation bilaterally with normal work of breathing.  GI: Soft and nontender.  MS: No deformity or atrophy. Gait and ROM intact.  Skin: Warm and dry. Color is normal.  Neuro:  Strength and sensation are intact and no gross focal deficits noted.  Psych: Alert, appropriate and with normal affect.   LABORATORY DATA:  EKG:  EKG is not ordered today.    Lab Results  Component Value Date   WBC 9.7 04/16/2018   HGB 14.4 04/16/2018   HCT 42.5 04/16/2018   PLT 235 04/16/2018   GLUCOSE 208 (H) 04/16/2018   CHOL 187 03/04/2018   TRIG 267 (H) 03/04/2018   HDL 44 03/04/2018   LDLCALC 90 03/04/2018   ALT 20 03/04/2018   AST 23 03/04/2018   NA 141 04/16/2018   K 4.7 04/16/2018   CL 98 04/16/2018   CREATININE 1.21 (H) 04/16/2018   BUN 14 04/16/2018   CO2 24 04/16/2018   TSH 2.920 03/04/2018       BNP (last 3 results) No results for input(s): BNP in the last 8760 hours.  ProBNP  (last 3 results) No results for input(s): PROBNP in the last 8760 hours.   Other Studies Reviewed Today:  EchoStudy Conclusions3/2019  - Left ventricle: The cavity size was normal. Wall thickness was normal. Systolic function was normal. The estimated ejection fraction was in the range of 55% to 60%. - Mitral valve: Calcified annulus. -  Right atrium: The atrium was mildly dilated. - Tricuspid valve: There was mild-moderate regurgitation. - Pulmonary arteries: PA peak pressure: 37 mm Hg (S).    ASSESSMENT & PLAN:   1. Chronic bouts of anxiety/weakness/atypical chest pain - she has had this for many years. Seems stable at this time. Does not tolerate medicines well. Options limited. She has not wanted testing done. I think most of this is driven from anxiety.   2. Persistent AF - rate is fine.   3. Chronic anticoagulation - would continue her current therapy with Eliquis.   4. HTN - BP looks good here today - would continue current regimen.   5. Long standing grief - I do not really see this every changing.   6. CAD - managed medically - does not tolerate Imdur. NTG sl refilled today.   7. Neck pain - she is worried about her carotid disease - last check thru PCP - will get updated.   Current medicines are reviewed with the patient today.  The patient does not have concerns regarding medicines other than what has been noted above.  The following changes have been made:  See above.  Labs/ tests ordered today include:    Orders Placed This Encounter  Procedures  . Basic metabolic panel  . CBC     Disposition:   FU with me in 4 months.      Patient is agreeable to this plan and will call if any problems develop in the interim.   SignedTruitt Merle, NP  09/08/2020 1:58 PM  [facility] [address] [place], [place]  [address] Phone: [phone] Fax: [phone]

## 2020-09-08 ENCOUNTER — Other Ambulatory Visit: Payer: Self-pay

## 2020-09-08 ENCOUNTER — Encounter: Payer: Self-pay | Admitting: Nurse Practitioner

## 2020-09-08 ENCOUNTER — Ambulatory Visit (INDEPENDENT_AMBULATORY_CARE_PROVIDER_SITE_OTHER): Payer: Medicare Other | Admitting: Nurse Practitioner

## 2020-09-08 VITALS — BP 118/76 | HR 73 | Ht 66.0 in | Wt 196.4 lb

## 2020-09-08 DIAGNOSIS — I6529 Occlusion and stenosis of unspecified carotid artery: Secondary | ICD-10-CM | POA: Diagnosis not present

## 2020-09-08 DIAGNOSIS — Z79899 Other long term (current) drug therapy: Secondary | ICD-10-CM

## 2020-09-08 DIAGNOSIS — Z7901 Long term (current) use of anticoagulants: Secondary | ICD-10-CM | POA: Diagnosis not present

## 2020-09-08 DIAGNOSIS — I4819 Other persistent atrial fibrillation: Secondary | ICD-10-CM | POA: Diagnosis not present

## 2020-09-08 MED ORDER — NITROGLYCERIN 0.4 MG SL SUBL: 0.4000 mg | SUBLINGUAL_TABLET | SUBLINGUAL | 3 refills | Status: DC | PRN

## 2020-09-08 NOTE — Patient Instructions (Addendum)
After Visit Summary:  We will be checking the following labs today - BMET & CBC   Medication Instructions:    Continue with your current medicines.    If you need a refill on your cardiac medications before your next appointment, please call your pharmacy.     Testing/Procedures To Be Arranged:  N/A  Follow-Up:   See me in about 4 months.     At [facility], you and your health needs are our priority.  As part of our continuing mission to provide you with exceptional heart care, we have created designated Provider Care Teams.  These Care Teams include your primary Cardiologist (physician) and Advanced Practice Providers (APPs -  Physician Assistants and Nurse Practitioners) who all work together to provide you with the care you need, when you need it.  Special Instructions:  . Stay safe, wash your hands for at least 20 seconds and wear a mask when needed.  . It was good to talk with you today. . Think about getting a shot in your knee - this can help.     Call the [place] office at [phone] if you have any questions, problems or concerns.

## 2020-09-09 LAB — BASIC METABOLIC PANEL
BUN/Creatinine Ratio: 18 (ref 12–28)
BUN: 17 mg/dL (ref 10–36)
CO2: 27 mmol/L (ref 20–29)
Calcium: 9.7 mg/dL (ref 8.7–10.3)
Chloride: 101 mmol/L (ref 96–106)
Creatinine, Ser: 0.97 mg/dL (ref 0.57–1.00)
GFR calc Af Amer: 59 mL/min/{1.73_m2} — ABNORMAL LOW (ref >59)
GFR calc non Af Amer: 51 mL/min/{1.73_m2} — ABNORMAL LOW (ref >59)
Glucose: 110 mg/dL — ABNORMAL HIGH (ref 65–99)
Potassium: 4.6 mmol/L (ref 3.5–5.2)
Sodium: 138 mmol/L (ref 134–144)

## 2020-09-09 LAB — CBC
Hematocrit: 43.9 % (ref 34.0–46.6)
Hemoglobin: 15 g/dL (ref 11.1–15.9)
MCH: 31.3 pg (ref 26.6–33.0)
MCHC: 34.2 g/dL (ref 31.5–35.7)
MCV: 92 fL (ref 79–97)
Platelets: 235 10*3/uL (ref 150–450)
RBC: 4.8 x10E6/uL (ref 3.77–5.28)
RDW: 13 % (ref 11.7–15.4)
WBC: 9.9 10*3/uL (ref 3.4–10.8)

## 2020-09-29 ENCOUNTER — Other Ambulatory Visit: Payer: Self-pay | Admitting: Cardiovascular Disease

## 2020-09-29 NOTE — Telephone Encounter (Signed)
Eliquis 5mg  refill request received. Patient is 86 years old, weight-89.1kg, Crea-0.97 on 09/08/2020, Diagnosis-Afib, and last seen by Truitt Merle on 09/08/2020. Dose is appropriate based on dosing criteria. Will send in refill to requested pharmacy.

## 2020-10-04 DIAGNOSIS — I2721 Secondary pulmonary arterial hypertension: Secondary | ICD-10-CM | POA: Diagnosis not present

## 2020-10-04 DIAGNOSIS — Z23 Encounter for immunization: Secondary | ICD-10-CM | POA: Diagnosis not present

## 2020-10-04 DIAGNOSIS — I1310 Hypertensive heart and chronic kidney disease without heart failure, with stage 1 through stage 4 chronic kidney disease, or unspecified chronic kidney disease: Secondary | ICD-10-CM | POA: Diagnosis not present

## 2020-10-04 DIAGNOSIS — E1122 Type 2 diabetes mellitus with diabetic chronic kidney disease: Secondary | ICD-10-CM | POA: Diagnosis not present

## 2020-10-04 DIAGNOSIS — F4320 Adjustment disorder, unspecified: Secondary | ICD-10-CM | POA: Diagnosis not present

## 2020-10-04 DIAGNOSIS — I2510 Atherosclerotic heart disease of native coronary artery without angina pectoris: Secondary | ICD-10-CM | POA: Diagnosis not present

## 2020-10-04 DIAGNOSIS — I480 Paroxysmal atrial fibrillation: Secondary | ICD-10-CM | POA: Diagnosis not present

## 2020-10-04 DIAGNOSIS — F419 Anxiety disorder, unspecified: Secondary | ICD-10-CM | POA: Diagnosis not present

## 2020-10-04 DIAGNOSIS — R600 Localized edema: Secondary | ICD-10-CM | POA: Diagnosis not present

## 2020-10-04 DIAGNOSIS — Z0000 Encounter for general adult medical examination without abnormal findings: Secondary | ICD-10-CM | POA: Diagnosis not present

## 2020-10-04 DIAGNOSIS — E669 Obesity, unspecified: Secondary | ICD-10-CM | POA: Diagnosis not present

## 2020-10-04 DIAGNOSIS — R54 Age-related physical debility: Secondary | ICD-10-CM | POA: Diagnosis not present

## 2020-10-04 DIAGNOSIS — N1831 Chronic kidney disease, stage 3a: Secondary | ICD-10-CM | POA: Diagnosis not present

## 2020-10-08 DIAGNOSIS — M170 Bilateral primary osteoarthritis of knee: Secondary | ICD-10-CM | POA: Diagnosis not present

## 2020-11-03 DIAGNOSIS — H903 Sensorineural hearing loss, bilateral: Secondary | ICD-10-CM | POA: Diagnosis not present

## 2020-11-05 DIAGNOSIS — I480 Paroxysmal atrial fibrillation: Secondary | ICD-10-CM | POA: Diagnosis not present

## 2020-11-05 DIAGNOSIS — N1831 Chronic kidney disease, stage 3a: Secondary | ICD-10-CM | POA: Diagnosis not present

## 2020-11-05 DIAGNOSIS — E1122 Type 2 diabetes mellitus with diabetic chronic kidney disease: Secondary | ICD-10-CM | POA: Diagnosis not present

## 2020-11-05 DIAGNOSIS — R42 Dizziness and giddiness: Secondary | ICD-10-CM | POA: Diagnosis not present

## 2020-11-05 DIAGNOSIS — I1310 Hypertensive heart and chronic kidney disease without heart failure, with stage 1 through stage 4 chronic kidney disease, or unspecified chronic kidney disease: Secondary | ICD-10-CM | POA: Diagnosis not present

## 2020-11-15 DIAGNOSIS — E1122 Type 2 diabetes mellitus with diabetic chronic kidney disease: Secondary | ICD-10-CM | POA: Diagnosis not present

## 2020-11-18 DIAGNOSIS — Z23 Encounter for immunization: Secondary | ICD-10-CM | POA: Diagnosis not present

## 2020-12-28 NOTE — Progress Notes (Signed)
CARDIOLOGY OFFICE NOTE  Date:  01/11/2021    Tanya Ho Date of Birth: 07-05-1926 Medical Record [id number]  PCP:  Shon Baton, MD  Cardiologist:  Servando Snare Nahser   Chief Complaint  Patient presents with  . Follow-up    History of Present Illness: Tanya Ho is a 87 y.o. female who presents today for a follow up visit.  Seen for Dr. Acie Fredrickson. Former patient of Dr. Susa Simmonds. Primarily follows with me.     She has known mild/moderate CAD (previously was maintained on chronic Plavix) from cardiac cath back in 2006/03/30, HTN, HLD, palpitations, obesity, OA, hiatal hernia, GERD. She has her labs checked by Dr. Virgina Jock.    I have followed her thru the years. Husband died in 03/31/15 - she continues to grieve. Family spends the nights withher. She has tended to have issues with polypharmacy. She has developed AF - now on Eliquis and off Plavix. Has had chronic bouts of chest pain - has not tolerated Imdur in the past. Remains anxious as a general rule.    We did a telehealth visit back in 03/31/2019 - several somatic issues - still not sleeping. Few spells of hard to breath but primarily limited by her back pain. She has remainted limited by her back - was frustrated with the pandemic. Lots of sleep/anxiety issues which are chronic. Using TUMS and NTG. Seen as a work in last May after calling with lots of issues - I had added Norvasc - BP was better at follow up along with her chest pain. BP cuff was not correlating. Remains very anxious and scared as a general rule.   She has continued to not feel well - more spells of chest pain and arm pain - more anxious. Very sad and difficult situation. Not interested in having any tests nor to be hospitalized. Resistant to moving/going to assisted living or getting in home care. Last seen in September - seemed to overall be stable.    Comes in today. Here with [name] - who augments the history. She had a birthday. Doing ok for the most part. She does admit  to getting a little forgetful - has forgotten some locks on the door, was late on her medicines, etc. No real spells of chest pain. Still quite independent - does her own housework, laundry, cooking, etc. No palpitations. Did just have her carotids checked- this was very reassuring - 1 to 39% bilateral stenosis. No need for further imaging.   Past Medical History:  Diagnosis Date  . Back pain   . Bruises easily   . Coronary artery disease    NONOBSTRUCTIVE  . Fatigue   . Hyperlipidemia   . Hypertension   . Knee pain   . OA (osteoarthritis)   . Obesity   . Palpitations   . SOB (shortness of breath)     Past Surgical History:  Procedure Laterality Date  . BREAST BIOPSY     LEFT BREAST  . CARDIAC CATHETERIZATION  05/11/2006   OVERALL CARDIAC SIZE AND SILHOUETTE ARE NORMAL. EF ESTIMATED 60%  . KIDNEY STONES  1982  . KNEE ARTHROSCOPY  09/22/2008  . VAGINAL HYSTERECTOMY       Medications: Current Meds  Medication Sig  . ALPRAZolam (XANAX) 1 MG tablet Take 1 mg by mouth at bedtime as needed.  Marland Kitchen amLODipine (NORVASC) 2.5 MG tablet   . ELIQUIS 5 MG TABS tablet TAKE 1 TABLET TWICE DAILY  . ergocalciferol (VITAMIN D2)  50000 UNITS capsule Take 50,000 Units by mouth once a week.  . metoprolol (LOPRESSOR) 100 MG tablet Take 1 tablet (100 mg total) by mouth 2 (two) times daily.  . nitroGLYCERIN (NITROSTAT) 0.4 MG SL tablet Place 1 tablet (0.4 mg total) under the tongue every 5 (five) minutes as needed for chest pain.  . traMADol (ULTRAM) 50 MG tablet TAKE 1-2 TABLETS BY MOUTH EVERY 6-8 HOURS AS NEEDED FOR PAIN  . triamterene-hydrochlorothiazide (DYAZIDE) 37.5-25 MG per capsule Take 1 capsule by mouth every morning.     Allergies: Allergies  Allergen Reactions  . Codeine   . Demerol   . Morphine And Related     Social History: The patient  reports that she has never smoked. She has never used smokeless tobacco. She reports that she does not drink alcohol and does not use drugs.    Family History: The patient's family history includes Heart attack in her father; Heart disease in her mother; Stroke in her mother.   Review of Systems: Please see the history of present illness.   All other systems are reviewed and negative.   Physical Exam: VS:  BP 140/80 (BP Location: Left Arm, Patient Position: Sitting, Cuff Size: Normal)   Pulse 70   Ht 5\' 6"  (1.676 m)   Wt 192 lb (87.1 kg)   SpO2 96%   BMI 30.99 kg/m  .  BMI Body mass index is 30.99 kg/m.  Wt Readings from Last 3 Encounters:  01/11/21 192 lb (87.1 kg)  09/08/20 196 lb 6.4 oz (89.1 kg)  06/02/20 195 lb 12.8 oz (88.8 kg)    General: Pleasant. She looks younger than her stated age - she is in no acute distress.   Cardiac: Irregular irregular rhythm. Rate is ok. No murmurs, rubs, or gallops. No significant edema.  Respiratory:  Lungs are clear to auscultation bilaterally with normal work of breathing.  GI: Soft and nontender.  MS: No deformity or atrophy. Gait and ROM intact.  Skin: Warm and dry. Color is normal.  Neuro:  Strength and sensation are intact and no gross focal deficits noted.  Psych: Alert, appropriate and with normal affect.   LABORATORY DATA:  EKG:  EKG is not ordered today.   Lab Results  Component Value Date   WBC 9.9 09/08/2020   HGB 15.0 09/08/2020   HCT 43.9 09/08/2020   PLT 235 09/08/2020   GLUCOSE 110 (H) 09/08/2020   CHOL 187 03/04/2018   TRIG 267 (H) 03/04/2018   HDL 44 03/04/2018   LDLCALC 90 03/04/2018   ALT 20 03/04/2018   AST 23 03/04/2018   NA 138 09/08/2020   K 4.6 09/08/2020   CL 101 09/08/2020   CREATININE 0.97 09/08/2020   BUN 17 09/08/2020   CO2 27 09/08/2020   TSH 2.920 03/04/2018       BNP (last 3 results) No results for input(s): BNP in the last 8760 hours.  ProBNP (last 3 results) No results for input(s): PROBNP in the last 8760 hours.   Other Studies Reviewed Today:  Echo Study Conclusions 02/2018   - Left ventricle: The cavity size  was normal. Wall thickness was   normal. Systolic function was normal. The estimated ejection   fraction was in the range of 55% to 60%. - Mitral valve: Calcified annulus. - Right atrium: The atrium was mildly dilated. - Tricuspid valve: There was mild-moderate regurgitation. - Pulmonary arteries: PA peak pressure: 37 mm Hg (S).       ASSESSMENT &  PLAN:     1. Persistent AF - rate is ok - remains on full dose anticoagulation - no changes made today.   2. Chronic bouts of anxiety/weakness/atypical chest pain - seems fairly stable - has not tolerated medicines well - options are limited - she has not wanted any testing - we will continue with conservative therapy.   3. Chronic anticoagulation - still qualifies for the full dose. Lab on return.   4. HTN - BP is fine given her age - no changes made today.   5. Long standing grief - since husband's death - this has not really changed very much.   6. CAD - managed medically - see above.   7. Minimal carotid disease - does not need repeat imaging.   Current medicines are reviewed with the patient today.  The patient does not have concerns regarding medicines other than what has been noted above.  The following changes have been made:  See above.  Labs/ tests ordered today include:   No orders of the defined types were placed in this encounter.    Disposition:   FU with Dr. Johney Frame in 4 months - they are aware that I leave next month.    Patient is agreeable to this plan and will call if any problems develop in the interim.   SignedTruitt Merle, NP  01/11/2021 2:47 PM  [facility] [address] [place], [place]  [address] Phone: [phone] Fax: [phone]

## 2021-01-11 ENCOUNTER — Ambulatory Visit: Payer: Medicare Other | Admitting: Nurse Practitioner

## 2021-01-11 ENCOUNTER — Encounter: Payer: Self-pay | Admitting: Nurse Practitioner

## 2021-01-11 ENCOUNTER — Other Ambulatory Visit: Payer: Self-pay

## 2021-01-11 ENCOUNTER — Ambulatory Visit (HOSPITAL_COMMUNITY)
Admission: RE | Admit: 2021-01-11 | Discharge: 2021-01-11 | Disposition: A | Discharge status: Home / Self Care | Payer: Medicare Other | Source: Ambulatory Visit | Attending: Cardiovascular Disease | Admitting: Cardiovascular Disease

## 2021-01-11 ENCOUNTER — Ambulatory Visit (INDEPENDENT_AMBULATORY_CARE_PROVIDER_SITE_OTHER): Payer: Medicare Other | Admitting: Nurse Practitioner

## 2021-01-11 VITALS — BP 140/80 | HR 70 | Ht 66.0 in | Wt 192.0 lb

## 2021-01-11 DIAGNOSIS — F4321 Adjustment disorder with depressed mood: Secondary | ICD-10-CM | POA: Diagnosis not present

## 2021-01-11 DIAGNOSIS — I2510 Atherosclerotic heart disease of native coronary artery without angina pectoris: Secondary | ICD-10-CM | POA: Diagnosis not present

## 2021-01-11 DIAGNOSIS — F32A Depression, unspecified: Secondary | ICD-10-CM

## 2021-01-11 DIAGNOSIS — I10 Essential (primary) hypertension: Secondary | ICD-10-CM | POA: Diagnosis not present

## 2021-01-11 DIAGNOSIS — I6529 Occlusion and stenosis of unspecified carotid artery: Secondary | ICD-10-CM | POA: Insufficient documentation

## 2021-01-11 DIAGNOSIS — I6523 Occlusion and stenosis of bilateral carotid arteries: Secondary | ICD-10-CM

## 2021-01-11 DIAGNOSIS — I4819 Other persistent atrial fibrillation: Secondary | ICD-10-CM | POA: Diagnosis not present

## 2021-01-11 DIAGNOSIS — F419 Anxiety disorder, unspecified: Secondary | ICD-10-CM

## 2021-01-11 DIAGNOSIS — Z7901 Long term (current) use of anticoagulants: Secondary | ICD-10-CM

## 2021-01-11 NOTE — Patient Instructions (Signed)
After Visit Summary:  We will be checking the following labs today - NONE   Medication Instructions:    Continue with your current medicines.    If you need a refill on your cardiac medications before your next appointment, please call your pharmacy.     Testing/Procedures To Be Arranged:  N/A  Follow-Up:   See Dr. Gwyndolyn Kaufman in 4 months    At [facility], you and your health needs are our priority.  As part of our continuing mission to provide you with exceptional heart care, we have created designated Provider Care Teams.  These Care Teams include your primary Cardiologist (physician) and Advanced Practice Providers (APPs -  Physician Assistants and Nurse Practitioners) who all work together to provide you with the care you need, when you need it.  Special Instructions:  . Stay safe, wash your hands for at least 20 seconds and wear a mask when needed.  . It was good to talk with you today.    Call the [place] office at [phone] if you have any questions, problems or concerns.

## 2021-05-09 NOTE — Progress Notes (Deleted)
Cardiology Office Note:    Date:  05/09/2021   ID:  Tanya Ho, DOB [birth year], MRN [id number]  PCP:  Tanya Baton, MD   [facility] Providers Cardiologist:  None { 1}    Referring MD: Tanya Baton, MD    History of Present Illness:    Tanya Ho is a 87 y.o. female with a hx of CAD, afib on eliquis HTN, HLD, and GERD who was previously followed by Tanya Ho who now returns to clinic for follow-up.  Last saw Tanya Ho on 01/11/21 where she was overall doing okay from a CV standpoint.   Past Medical History:  Diagnosis Date  . Back pain   . Bruises easily   . Coronary artery disease    NONOBSTRUCTIVE  . Fatigue   . Hyperlipidemia   . Hypertension   . Knee pain   . OA (osteoarthritis)   . Obesity   . Palpitations   . SOB (shortness of breath)     Past Surgical History:  Procedure Laterality Date  . BREAST BIOPSY     LEFT BREAST  . CARDIAC CATHETERIZATION  05/11/2006   OVERALL CARDIAC SIZE AND SILHOUETTE ARE NORMAL. EF ESTIMATED 60%  . KIDNEY STONES  1982  . KNEE ARTHROSCOPY  09/22/2008  . VAGINAL HYSTERECTOMY      Current Medications: No outpatient medications have been marked as taking for the 05/11/21 encounter (Appointment) with Freada Bergeron, MD.     Allergies:   Codeine, Demerol, and Morphine and related   Social History   Socioeconomic History  . Marital status: Widowed    Spouse name: Not on file  . Number of children: Not on file  . Years of education: Not on file  . Highest education level: Not on file  Occupational History  . Not on file  Tobacco Use  . Smoking status: Never Smoker  . Smokeless tobacco: Never Used  Vaping Use  . Vaping Use: Never used  Substance and Sexual Activity  . Alcohol use: No  . Drug use: No  . Sexual activity: Not Currently  Other Topics Concern  . Not on file  Social History Narrative  . Not on file   Social Determinants of Health   Financial Resource Strain: Not on file  Food Insecurity: Not on  file  Transportation Needs: Not on file  Physical Activity: Not on file  Stress: Not on file  Social Connections: Not on file     Family History: The patient's ***family history includes Heart attack in her father; Heart disease in her mother; Stroke in her mother.  ROS:   Please see the history of present illness.    *** All other systems reviewed and are negative.  EKGs/Labs/Other Studies Reviewed:    The following studies were reviewed today: EchoStudy Conclusions3/2019  - Left ventricle: The cavity size was normal. Wall thickness was normal. Systolic function was normal. The estimated ejection fraction was in the range of 55% to 60%. - Mitral valve: Calcified annulus. - Right atrium: The atrium was mildly dilated. - Tricuspid valve: There was mild-moderate regurgitation. - Pulmonary arteries: PA peak pressure: 37 mm Hg (S).  EKG:  EKG is *** ordered today.  The ekg ordered today demonstrates ***  Recent Labs: 09/08/2020: BUN 17; Creatinine, Ser 0.97; Hemoglobin 15.0; Platelets 235; Potassium 4.6; Sodium 138  Recent Lipid Panel    Component Value Date/Time   CHOL 187 03/04/2018 1523   TRIG 267 (H) 03/04/2018 1523   HDL 44  03/04/2018 1523   CHOLHDL 4.3 03/04/2018 1523   LDLCALC 90 03/04/2018 1523     Risk Assessment/Calculations:   {Does this patient have ATRIAL FIBRILLATION?[phone]}   Physical Exam:    VS:  There were no vitals taken for this visit.    Wt Readings from Last 3 Encounters:  01/11/21 192 lb (87.1 kg)  09/08/20 196 lb 6.4 oz (89.1 kg)  06/02/20 195 lb 12.8 oz (88.8 kg)     GEN: *** Well nourished, well developed in no acute distress HEENT: Normal NECK: No JVD; No carotid bruits LYMPHATICS: No lymphadenopathy CARDIAC: ***RRR, no murmurs, rubs, gallops RESPIRATORY:  Clear to auscultation without rales, wheezing or rhonchi  ABDOMEN: Soft, non-tender, non-distended MUSCULOSKELETAL:  No edema; No deformity  SKIN: Warm and  dry NEUROLOGIC:  Alert and oriented x 3 PSYCHIATRIC:  Normal affect   ASSESSMENT:    No diagnosis found. PLAN:    In order of problems listed above:  #Persistent Afib: On eliquis without bleeding issues. -Continue apixaban 5mg  BID -Contnue metop 100mg  BID  #CAD: On medical management. No history of PCI. Not on plavix due to need for [facility]. -Continue mtop 100mg  BID  #Mild carotid disease: -No further imaging needed  #HTN: -Continue amlodipine 2.5mg  daily -Continue triamerene-HCTZ 37.5mg -25mg  daily   {Are you ordering a CV Procedure (e.g. stress test, cath, DCCV, TEE, etc)?   Press F2        :[id number]}    Medication Adjustments/Labs and Tests Ordered: Current medicines are reviewed at length with the patient today.  Concerns regarding medicines are outlined above.  No orders of the defined types were placed in this encounter.  No orders of the defined types were placed in this encounter.   There are no Patient Instructions on file for this visit.   Signed, Freada Bergeron, MD  05/09/2021 8:47 PM    [facility]

## 2021-05-11 ENCOUNTER — Other Ambulatory Visit: Payer: Self-pay

## 2021-05-11 ENCOUNTER — Ambulatory Visit (INDEPENDENT_AMBULATORY_CARE_PROVIDER_SITE_OTHER): Payer: Medicare Other | Admitting: Cardiology

## 2021-05-11 ENCOUNTER — Encounter: Payer: Self-pay | Admitting: Cardiology

## 2021-05-11 VITALS — BP 140/82 | HR 66 | Ht 66.0 in | Wt 197.8 lb

## 2021-05-11 DIAGNOSIS — I10 Essential (primary) hypertension: Secondary | ICD-10-CM

## 2021-05-11 NOTE — Progress Notes (Signed)
Cardiology Office Note:    Date:  05/11/2021   ID:  [name], DOB 06-14-1926, MRN [id number]  PCP:  Shon Baton, MD   [facility] Providers Cardiologist:  None     Referring MD: Shon Baton, MD    History of Present Illness:    FUSAYE WACHTEL is a 87 y.o. female with a hx of CAD, afib on eliquis HTN, HLD, and GERD who was previously followed by Truitt Merle who now returns to clinic for follow-up.  Last saw [name] on 01/11/21 where she was overall doing okay from a CV standpoint.   Today, she is doing fairly well but she has some fatigue and weakness due to age. She also has some bilateral LE edema(R>L). Swelling worsens during the summertime. Denies wearing compression stockings or elevating her legs. Her at home systolic blood pressure averages in the 120s-typically higher in the morning but after taking Dyazide and Lopressor it improves symptoms. Her palpitations have improved overall with minimal symptoms throughout the day.  Tolerating apixaban without bleeding issues.  She denies any exertional chest pain, tightness, or pressure, shortness of breath. She denies any lightheadedness, dizziness, syncopal episodes, orthopnea, or PND.   Past Medical History:  Diagnosis Date  . Back pain   . Bruises easily   . Coronary artery disease    NONOBSTRUCTIVE  . Fatigue   . Hyperlipidemia   . Hypertension   . Knee pain   . OA (osteoarthritis)   . Obesity   . Palpitations   . SOB (shortness of breath)     Past Surgical History:  Procedure Laterality Date  . BREAST BIOPSY     LEFT BREAST  . CARDIAC CATHETERIZATION  05/11/2006   OVERALL CARDIAC SIZE AND SILHOUETTE ARE NORMAL. EF ESTIMATED 60%  . KIDNEY STONES  1982  . KNEE ARTHROSCOPY  09/22/2008  . VAGINAL HYSTERECTOMY      Current Medications: Current Meds  Medication Sig  . ALPRAZolam (XANAX) 1 MG tablet Take 1 mg by mouth at bedtime as needed.  Marland Kitchen amLODipine (NORVASC) 2.5 MG tablet   . ELIQUIS 5 MG TABS tablet TAKE 1  TABLET TWICE DAILY  . ergocalciferol (VITAMIN D2) 50000 UNITS capsule Take 50,000 Units by mouth once a week.  . metoprolol (LOPRESSOR) 100 MG tablet Take 1 tablet (100 mg total) by mouth 2 (two) times daily.  . nitroGLYCERIN (NITROSTAT) 0.4 MG SL tablet Place 1 tablet (0.4 mg total) under the tongue every 5 (five) minutes as needed for chest pain.  . traMADol (ULTRAM) 50 MG tablet TAKE 1-2 TABLETS BY MOUTH EVERY 6-8 HOURS AS NEEDED FOR PAIN  . triamterene-hydrochlorothiazide (DYAZIDE) 37.5-25 MG per capsule Take 1 capsule by mouth every morning.     Allergies:   Codeine, Demerol, and Morphine and related   Social History   Socioeconomic History  . Marital status: Widowed    Spouse name: Not on file  . Number of children: Not on file  . Years of education: Not on file  . Highest education level: Not on file  Occupational History  . Not on file  Tobacco Use  . Smoking status: Never Smoker  . Smokeless tobacco: Never Used  Vaping Use  . Vaping Use: Never used  Substance and Sexual Activity  . Alcohol use: No  . Drug use: No  . Sexual activity: Not Currently  Other Topics Concern  . Not on file  Social History Narrative  . Not on file   Social Determinants of  Health   Financial Resource Strain: Not on file  Food Insecurity: Not on file  Transportation Needs: Not on file  Physical Activity: Not on file  Stress: Not on file  Social Connections: Not on file     Family History: The patient's family history includes Heart attack in her father; Heart disease in her mother; Stroke in her mother.  Review of Systems  Constitutional: Positive for malaise/fatigue. Negative for chills and fever.  HENT: Negative for congestion.   Respiratory: Negative for shortness of breath.   Cardiovascular: Positive for palpitations and leg swelling (mild ). Negative for chest pain, orthopnea and PND.  Gastrointestinal: Negative for blood in stool, constipation, nausea and vomiting.   Genitourinary: Negative for dysuria and hematuria.  Skin: Negative for rash.  Neurological: Negative for dizziness and headaches.      EKGs/Labs/Other Studies Reviewed:    The following studies were reviewed today: EchoStudy Conclusions3/2019 - Left ventricle: The cavity size was normal. Wall thickness was normal. Systolic function was normal. The estimated ejection fraction was in the range of 55% to 60%. - Mitral valve: Calcified annulus. - Right atrium: The atrium was mildly dilated. - Tricuspid valve: There was mild-moderate regurgitation. - Pulmonary arteries: PA peak pressure: 37 mm Hg (S).  EKG:  05/11/2021- Atrial fibrillation with HR 64   Recent Labs: 09/08/2020: BUN 17; Creatinine, Ser 0.97; Hemoglobin 15.0; Platelets 235; Potassium 4.6; Sodium 138  Recent Lipid Panel    Component Value Date/Time   CHOL 187 03/04/2018 1523   TRIG 267 (H) 03/04/2018 1523   HDL 44 03/04/2018 1523   CHOLHDL 4.3 03/04/2018 1523   LDLCALC 90 03/04/2018 1523     Risk Assessment/Calculations:    CHA2DS2-VASc Score = 5  {This indicates a 7.2% annual risk of stroke. The patient's score is based upon: CHF History: No HTN History: Yes Diabetes History: No Stroke History: No Vascular Disease History: Yes Age Score: 2 Gender Score: 1    Physical Exam:    VS:  BP 140/82   Pulse 66   Ht 5\' 6"  (1.676 m)   Wt 197 lb 12.8 oz (89.7 kg)   SpO2 95%   BMI 31.93 kg/m     Wt Readings from Last 3 Encounters:  05/11/21 197 lb 12.8 oz (89.7 kg)  01/11/21 192 lb (87.1 kg)  09/08/20 196 lb 6.4 oz (89.1 kg)     GEN:  Elderly female, NAD HEENT: Normal NECK: No JVD; No carotid bruits CARDIAC: Irregularly Irregular rhythm, no murmurs, rubs, gallops RESPIRATORY:  Clear to auscultation without rales, wheezing or rhonchi  ABDOMEN: Soft, non-tender, non-distended MUSCULOSKELETAL:  1+ pitting edema to the ankles ; No deformity  SKIN: Warm and dry NEUROLOGIC:  Alert and oriented x  3 PSYCHIATRIC:  Normal affect   ASSESSMENT:    1. Essential hypertension    PLAN:    In order of problems listed above:  #Permanent Afib: CHADs-vasc 5. On eliquis without bleeding issues. Pursuing rate control strategy as patient is doing well with no symptoms.  -Continue apixaban 5mg  BID -Contnue metop 100mg  BID  #CAD: On medical management. No history of PCI. Was previously on plavix but was stopped due to need for [facility] -Continue mtop 100mg  BID  #Mild carotid disease: Right carotid artery 1-39% 12/2020.  -No further imaging needed  #HTN: Well controlled at home.  -Continue amlodipine 2.5mg  daily -Continue triamerene-HCTZ 37.5mg -25mg  daily   Medication Adjustments/Labs and Tests Ordered: Current medicines are reviewed at length with the patient today.  Concerns regarding medicines are outlined above.  Orders Placed This Encounter  Procedures  . EKG 12-Lead   No orders of the defined types were placed in this encounter.   Patient Instructions  Medication Instructions:   Your physician recommends that you continue on your current medications as directed. Please refer to the Current Medication list given to you today.  *If you need a refill on your cardiac medications before your next appointment, please call your pharmacy*  Follow-Up: At [facility], you and your health needs are our priority.  As part of our continuing mission to provide you with exceptional heart care, we have created designated Provider Care Teams.  These Care Teams include your primary Cardiologist (physician) and Advanced Practice Providers (APPs -  Physician Assistants and Nurse Practitioners) who all work together to provide you with the care you need, when you need it.  We recommend signing up for the patient portal called "MyChart".  Sign up information is provided on this After Visit Summary.  MyChart is used to connect with patients for Virtual Visits (Telemedicine).  Patients are able to  view lab/test results, encounter notes, upcoming appointments, etc.  Non-urgent messages can be sent to your provider as well.   To learn more about what you can do with MyChart, go to [email or web address].    Your next appointment:   6 month(s)  The format for your next appointment:   In Person  Provider:   Richardson Dopp, PA-C          I,Stephanie Williams,acting as a scribe for Freada Bergeron, MD.,have documented all relevant documentation on the behalf of Freada Bergeron, MD,as directed by  Freada Bergeron, MD while in the presence of Freada Bergeron, MD.   I, Freada Bergeron, MD, have reviewed all documentation for this visit. The documentation on 05/11/21 for the exam, diagnosis, procedures, and orders are all accurate and complete.   Signed, Freada Bergeron, MD  05/11/2021 4:22 PM    [place]

## 2021-05-11 NOTE — Patient Instructions (Signed)
Medication Instructions:   Your physician recommends that you continue on your current medications as directed. Please refer to the Current Medication list given to you today.  *If you need a refill on your cardiac medications before your next appointment, please call your pharmacy*  Follow-Up: At [facility], you and your health needs are our priority.  As part of our continuing mission to provide you with exceptional heart care, we have created designated Provider Care Teams.  These Care Teams include your primary Cardiologist (physician) and Advanced Practice Providers (APPs -  Physician Assistants and Nurse Practitioners) who all work together to provide you with the care you need, when you need it.  We recommend signing up for the patient portal called "MyChart".  Sign up information is provided on this After Visit Summary.  MyChart is used to connect with patients for Virtual Visits (Telemedicine).  Patients are able to view lab/test results, encounter notes, upcoming appointments, etc.  Non-urgent messages can be sent to your provider as well.   To learn more about what you can do with MyChart, go to [email or web address].    Your next appointment:   6 month(s)  The format for your next appointment:   In Person  Provider:   Richardson Dopp, PA-C

## 2021-05-31 ENCOUNTER — Other Ambulatory Visit: Payer: Self-pay | Admitting: Cardiovascular Disease

## 2021-05-31 DIAGNOSIS — I4891 Unspecified atrial fibrillation: Secondary | ICD-10-CM

## 2021-05-31 NOTE — Telephone Encounter (Signed)
Eliquis 5mg  refill request received. Patient is 87 years old, weight-89.7kg, Crea-0.97 on 09/08/2020, Diagnosis-Afib, and last seen by Dr. Gwyndolyn Kaufman 05/11/2021. Dose is appropriate based on dosing criteria. Will send in refill to requested pharmacy.

## 2021-08-29 ENCOUNTER — Telehealth: Payer: Self-pay | Admitting: Cardiology

## 2021-08-29 NOTE — Telephone Encounter (Signed)
Pts daughter [name] (on [place]) is calling to schedule the pt an appt to see an APP this week, for bilateral lower extremity edema, high BP readings, and sob. Daughter reports that over the weekend, the pt started experiencing bilateral lower extremity edema and high BP readings.  Daughter states the pts BP's before med administration runs in the high 180s over high 90s.  Daughter states after the pt administers her meds and waits an hour, her pressures come back down to the 150s over 70s. No HR's reported. Daughter states she also experienced swelling over the weekend, but this morning her feet and ankles have somewhat improved. Daughter states that they notice she does get easily sob when exerting or walking from room-to-room. Daughter states this morning the pts BP before meds was 190/95.  After she took her cardiac meds, this came down to 157/73. Daughter states the pt denies chest pain or chest pain on exertion.  Daughter states the pt does not have any CP, N/V, diaphoresis, pre-syncopal or syncopal episodes.  Daughter states the pt is resting comfortably in her chair at this time.  Daughter also adds that the pts anti-anxiety med xanax, has helped her calm down when her numbers are elevated.  Daughter reports the pt does not dry weigh herself, and she refuses to wear compression stockings.  [name] states when her pressures do get high, the pt does complain of mild dizziness and headache. She said the pt has no weakness. Daughter states she would like the pt to be seen for complaints, so she can get labs and EKG done, to further evaluate. Scheduled the pt to come in and see Richardson Dopp PA-C on tomorrow 9/13 at 0945.  Daughter was advised to have pt here 15 mins prior to this appt.  Advised the pts daughter to have her continue her med regimen, limit salt intake.  Advised the pts daughter to continue monitoring her BP/HR one hour after med administration, and record these values.  Also advised her to  dry weigh the pt and record this as well.  Weight parameters reviewed with pts daughter.  Advised daughter to bring these recordings to the pts appt with Nicki Reaper tomorrow.  Advised the pts daughter that she should highly encourage the pt to wear compression stockings during the day.  Also advised her to have the pt elevate her extremities while at rest. Informed the pts daughter that I will route this message to Dr. Jacolyn Reedy in-basket for covering to review, due to Dr. Johney Frame being on maternity leave. ED precautions provided to the pts daughter, if symptoms were to worsen or persist in the meantime between now and her appt tomorrow.  Daughter verbalized understanding and agrees with this plan. Daughter was more than gracious for all the assistance provided.

## 2021-08-29 NOTE — Telephone Encounter (Signed)
Pt c/o swelling: STAT is pt has developed SOB within 24 hours  If swelling, where is the swelling located? Ankles and feet  How much weight have you gained and in what time span? Doesn't know   Have you gained 3 pounds in a day or 5 pounds in a week? Doesn't know   Do you have a log of your daily weights (if so, list)? No   Are you currently taking a fluid pill? Yes  Are you currently SOB? No, has loose call   Have you traveled recently? No  Pt c/o BP issue: STAT if pt c/o blurred vision, one-sided weakness or slurred speech  1. What are your last 5 BP readings?  157/73 191/101 206/109  2. Are you having any other symptoms (ex. Dizziness, headache, blurred vision, passed out)? Hurting and aching on left side   3. What is your BP issue? Hypertension

## 2021-08-30 ENCOUNTER — Other Ambulatory Visit: Payer: Self-pay

## 2021-08-30 ENCOUNTER — Ambulatory Visit (INDEPENDENT_AMBULATORY_CARE_PROVIDER_SITE_OTHER): Payer: Medicare Other | Admitting: Cardiology

## 2021-08-30 ENCOUNTER — Telehealth: Payer: Self-pay | Admitting: *Deleted

## 2021-08-30 ENCOUNTER — Ambulatory Visit: Payer: Medicare Other | Admitting: Physician Assistant

## 2021-08-30 ENCOUNTER — Encounter: Payer: Self-pay | Admitting: Cardiology

## 2021-08-30 VITALS — BP 128/86 | HR 64 | Ht 66.0 in | Wt 200.4 lb

## 2021-08-30 DIAGNOSIS — R600 Localized edema: Secondary | ICD-10-CM

## 2021-08-30 DIAGNOSIS — I6529 Occlusion and stenosis of unspecified carotid artery: Secondary | ICD-10-CM

## 2021-08-30 DIAGNOSIS — R0602 Shortness of breath: Secondary | ICD-10-CM

## 2021-08-30 MED ORDER — FUROSEMIDE 20 MG PO TABS: 20.0000 mg | ORAL_TABLET | Freq: Every day | ORAL | 1 refills | Status: DC | PRN

## 2021-08-30 NOTE — Progress Notes (Signed)
Electrophysiology Office Note   Date:  08/30/2021   ID:  Tanya Ho, DOB 02/12/1926, MRN [id number]  PCP:  Shon Baton, MD  Cardiologist:  Johney Frame  Chief Complaint: HTN, swelling   History of Present Illness: Tanya Ho is a 87 y.o. female who is being seen today for the evaluation of HTN, swelling at the request of Shon Baton, MD. Presenting today for electrophysiology evaluation.  She has a history significant for coronary artery disease, atrial fibrillation, hyperlipidemia, and GERD.  She has intermittent lower extremity edema.  Unfortunately she is been having significant lower extremity edema, hypertension, and shortness of breath over the last few days.  Blood pressures are in the 180s over 90s at home.  After taking her medications, they come down to the 150s over 70s.  She has been experiencing edema over the weekend.  She also gets easily short of breath when exerting herself walking from room to room.  She does not have chest pain, diaphoresis, or near syncope.  Her lower extremity edema started approximately 1 week ago.  This is around the time that she became short of breath.  She is also had a cough over that period of time.  She can find no exacerbating or alleviating factors.  She is unsure what caused her issues.  Today, she denies symptoms of palpitations, chest pain,  orthopnea, PND, claudication, dizziness, presyncope, syncope, bleeding, or neurologic sequela. The patient is tolerating medications without difficulties.    Past Medical History:  Diagnosis Date   Back pain    Bruises easily    Coronary artery disease    NONOBSTRUCTIVE   Fatigue    Hyperlipidemia    Hypertension    Knee pain    OA (osteoarthritis)    Obesity    Palpitations    SOB (shortness of breath)    Past Surgical History:  Procedure Laterality Date   BREAST BIOPSY     LEFT BREAST   CARDIAC CATHETERIZATION  05/11/2006   OVERALL CARDIAC SIZE AND SILHOUETTE ARE NORMAL. EF ESTIMATED  60%   KIDNEY STONES  1982   KNEE ARTHROSCOPY  09/22/2008   VAGINAL HYSTERECTOMY       Current Outpatient Medications  Medication Sig Dispense Refill   ALPRAZolam (XANAX) 1 MG tablet Take 1 mg by mouth at bedtime as needed.  3   amLODipine (NORVASC) 2.5 MG tablet      apixaban (ELIQUIS) 5 MG TABS tablet TAKE 1 TABLET TWICE DAILY 180 tablet 1   ergocalciferol (VITAMIN D2) 50000 UNITS capsule Take 50,000 Units by mouth once a week.     metoprolol (LOPRESSOR) 100 MG tablet Take 1 tablet (100 mg total) by mouth 2 (two) times daily. 180 tablet 3   nitroGLYCERIN (NITROSTAT) 0.4 MG SL tablet Place 1 tablet (0.4 mg total) under the tongue every 5 (five) minutes as needed for chest pain. 25 tablet 3   traMADol (ULTRAM) 50 MG tablet TAKE 1-2 TABLETS BY MOUTH EVERY 6-8 HOURS AS NEEDED FOR PAIN  0   triamterene-hydrochlorothiazide (DYAZIDE) 37.5-25 MG per capsule Take 1 capsule by mouth every morning.     No current facility-administered medications for this visit.    Allergies:   Codeine, Demerol, and Morphine and related   Social History:  The patient  reports that she has never smoked. She has never used smokeless tobacco. She reports that she does not drink alcohol and does not use drugs.   Family History:  The patient's family history includes  Heart attack in her father; Heart disease in her mother; Stroke in her mother.    ROS:  Please see the history of present illness.   Otherwise, review of systems is positive for none.   All other systems are reviewed and negative.    PHYSICAL EXAM: VS:  BP 128/86   Pulse 64   Ht '5\' 6"'$  (1.676 m)   Wt 200 lb 6.4 oz (90.9 kg)   SpO2 96%   BMI 32.35 kg/m  , BMI Body mass index is 32.35 kg/m. GEN: Well nourished, well developed, in no acute distress  HEENT: normal  Neck: no JVD, carotid bruits, or masses Cardiac: irregular; no murmurs, rubs, or gallops, 2+edema  Respiratory:  clear to auscultation bilaterally, normal work of breathing GI: soft,  nontender, nondistended, + BS MS: no deformity or atrophy  Skin: warm and dry Neuro:  Strength and sensation are intact Psych: euthymic mood, full affect  EKG:  EKG is ordered today. Personal review of the ekg ordered shows atrial fibrillation, rate 64   Recent Labs: 09/08/2020: BUN 17; Creatinine, Ser 0.97; Hemoglobin 15.0; Platelets 235; Potassium 4.6; Sodium 138    Lipid Panel     Component Value Date/Time   CHOL 187 03/04/2018 1523   TRIG 267 (H) 03/04/2018 1523   HDL 44 03/04/2018 1523   CHOLHDL 4.3 03/04/2018 1523   LDLCALC 90 03/04/2018 1523     Wt Readings from Last 3 Encounters:  08/30/21 200 lb 6.4 oz (90.9 kg)  05/11/21 197 lb 12.8 oz (89.7 kg)  01/11/21 192 lb (87.1 kg)      Other studies Reviewed: Additional studies/ records that were reviewed today include: TTE 2019  Review of the above records today demonstrates:  - Left ventricle: The cavity size was normal. Wall thickness was    normal. Systolic function was normal. The estimated ejection    fraction was in the range of 55% to 60%.  - Mitral valve: Calcified annulus.  - Right atrium: The atrium was mildly dilated.  - Tricuspid valve: There was mild-moderate regurgitation.  - Pulmonary arteries: PA peak pressure: 37 mm Hg (S).    ASSESSMENT AND PLAN:  1.  Essential hypertension: Blood pressure was significantly elevated over the weekend, though has been better controlled since then.  No changes at this time.  2.  Permanent atrial fibrillation: CHA2DS2-VASc of 5.  Continue Eliquis 5 mg twice daily, metoprolol 100 mg twice daily  3.  Lower extremity edema: Has been having significant edema over the last week or so with associated shortness of breath.  She states that she has been urinating less.  We Johnae Friley start her on Lasix 20 mg a day for the next 4 days.  We Makinley Muscato also give her 20 mg tabs to take on an as-needed basis.  We Baker Moronta check a BNP and basic metabolic.  We Delson Dulworth have her follow-up with general  cardiology in 1 month.  Case discussed with primary cardiology  Current medicines are reviewed at length with the patient today.   The patient does not have concerns regarding her medicines.  The following changes were made today: Start Lasix  Labs/ tests ordered today include:  Orders Placed This Encounter  Procedures   Basic metabolic panel   Pro b natriuretic peptide (BNP)   EKG 12-Lead     Disposition:   FU with general cardiology in 1 month  Signed, Abigail Teall Meredith Leeds, MD  08/30/2021 12:19 PM     [place] [id number]  [address] [place] [address] [phone] (office) [phone] (fax)

## 2021-08-30 NOTE — Telephone Encounter (Signed)
S/w pt's daughter is aware appt got moved due to [place] being sick today. Pt is moved to DOD schedule today for Dr. Curt Bears, Dr. Curt Bears is also aware.

## 2021-08-30 NOTE — Patient Instructions (Addendum)
Medication Instructions:  Your physician has recommended you make the following change in your medication:  TAKE Lasix 20 mg once daily as needed for swelling --- please take it once daily for the next 4 days and then take daily as needed  *If you need a refill on your cardiac medications before your next appointment, please call your pharmacy*   Lab Work: Today: BMET & BNP If you have labs (blood work) drawn today and your tests are completely normal, you will receive your results only by: [place] (if you have MyChart) OR A paper copy in the mail If you have any lab test that is abnormal or we need to change your treatment, we will call you to review the results.   Testing/Procedures: None ordered   Follow-Up: At [facility], you and your health needs are our priority.  As part of our continuing mission to provide you with exceptional heart care, we have created designated Provider Care Teams.  These Care Teams include your primary Cardiologist (physician) and Advanced Practice Providers (APPs -  Physician Assistants and Nurse Practitioners) who all work together to provide you with the care you need, when you need it.  Your next appointment:   1 month(s)  The format for your next appointment:   In Person  Provider:   You will see one of the following Advanced Practice Providers on your designated Care Team:   Richardson Dopp, PA-C [place], [place]    Thank you for choosing [facility]!!   [phone]

## 2021-08-31 LAB — BASIC METABOLIC PANEL
BUN/Creatinine Ratio: 15 (ref 12–28)
BUN: 13 mg/dL (ref 10–36)
CO2: 25 mmol/L (ref 20–29)
Calcium: 8.9 mg/dL (ref 8.7–10.3)
Chloride: 103 mmol/L (ref 96–106)
Creatinine, Ser: 0.89 mg/dL (ref 0.57–1.00)
Glucose: 109 mg/dL — ABNORMAL HIGH (ref 65–99)
Potassium: 3.8 mmol/L (ref 3.5–5.2)
Sodium: 140 mmol/L (ref 134–144)
eGFR: 60 mL/min/{1.73_m2} (ref >59)

## 2021-08-31 LAB — PRO B NATRIURETIC PEPTIDE: NT-Pro BNP: 1496 pg/mL — ABNORMAL HIGH (ref 0–738)

## 2021-08-31 NOTE — Telephone Encounter (Signed)
Pt saw DOD Dr. Curt Bears yesterday, in absence of Agency PA-C. See office visit note from yesterday, for further details.

## 2021-09-13 ENCOUNTER — Telehealth: Payer: Self-pay | Admitting: *Deleted

## 2021-09-13 DIAGNOSIS — R600 Localized edema: Secondary | ICD-10-CM

## 2021-09-13 DIAGNOSIS — Z79899 Other long term (current) drug therapy: Secondary | ICD-10-CM

## 2021-09-13 NOTE — Telephone Encounter (Signed)
Teacher, adult education from [place]. Ordered lab and scheduled. Patient agreeable and verbalized understanding.

## 2021-09-13 NOTE — Telephone Encounter (Signed)
Daughter called straight into [facility] to report that pt's left ankle is more swollen this morning than it was when she was in to see Dr. Curt Bears.. Stated the Lasix has been working but was concerned about the worse swelling in the L ankle.   Pt's daughter states that the pt says she has been taking the Lasix every day.  Had her call and have someone count the Lasix tablets and she called and confirmed that by the # of tablets.  Her daughter stated that it looks a lot better then it did this morning.   She can't say if pt hasn't ate any salt or not, but stated that she probably has.  Her weight was only up 1 lb from yesterday.  She is aware that I will send Richardson Dopp, PA-C a message and it may be after his clinic before he has someone call back, she verbalized understanding.

## 2021-09-13 NOTE — Telephone Encounter (Signed)
She can take 1 extra dose of Furosemide 20 mg. Since she is taking the furosemide every day, she needs f/u labs. Have her come in this week for a BMET. Keep legs elevated. Low Na diet. Richardson Dopp, PA-C    09/13/2021 5:15 PM

## 2021-09-20 ENCOUNTER — Other Ambulatory Visit: Payer: Self-pay

## 2021-09-20 ENCOUNTER — Other Ambulatory Visit: Payer: Medicare Other | Admitting: *Deleted

## 2021-09-20 DIAGNOSIS — R600 Localized edema: Secondary | ICD-10-CM

## 2021-09-20 DIAGNOSIS — Z79899 Other long term (current) drug therapy: Secondary | ICD-10-CM | POA: Diagnosis not present

## 2021-09-20 LAB — BASIC METABOLIC PANEL
BUN/Creatinine Ratio: 14 (ref 12–28)
BUN: 15 mg/dL (ref 10–36)
CO2: 24 mmol/L (ref 20–29)
Calcium: 9.5 mg/dL (ref 8.7–10.3)
Chloride: 99 mmol/L (ref 96–106)
Creatinine, Ser: 1.04 mg/dL — ABNORMAL HIGH (ref 0.57–1.00)
Glucose: 275 mg/dL — ABNORMAL HIGH (ref 70–99)
Potassium: 3.6 mmol/L (ref 3.5–5.2)
Sodium: 140 mmol/L (ref 134–144)
eGFR: 50 mL/min/{1.73_m2} — ABNORMAL LOW (ref >59)

## 2021-09-21 ENCOUNTER — Other Ambulatory Visit: Payer: Self-pay | Admitting: Physician Assistant

## 2021-09-23 ENCOUNTER — Other Ambulatory Visit: Payer: Self-pay | Admitting: *Deleted

## 2021-09-23 MED ORDER — FUROSEMIDE 20 MG PO TABS: 20.0000 mg | ORAL_TABLET | Freq: Two times a day (BID) | ORAL | 3 refills | Status: DC

## 2021-10-04 ENCOUNTER — Encounter (HOSPITAL_BASED_OUTPATIENT_CLINIC_OR_DEPARTMENT_OTHER): Payer: Self-pay | Admitting: Family

## 2021-10-04 ENCOUNTER — Ambulatory Visit (INDEPENDENT_AMBULATORY_CARE_PROVIDER_SITE_OTHER): Payer: Medicare Other | Admitting: Family

## 2021-10-04 ENCOUNTER — Other Ambulatory Visit: Payer: Self-pay

## 2021-10-04 VITALS — BP 120/70 | HR 78 | Ht 66.0 in | Wt 185.0 lb

## 2021-10-04 DIAGNOSIS — D6859 Other primary thrombophilia: Secondary | ICD-10-CM

## 2021-10-04 DIAGNOSIS — Z79899 Other long term (current) drug therapy: Secondary | ICD-10-CM

## 2021-10-04 DIAGNOSIS — R600 Localized edema: Secondary | ICD-10-CM

## 2021-10-04 DIAGNOSIS — I4821 Permanent atrial fibrillation: Secondary | ICD-10-CM | POA: Diagnosis not present

## 2021-10-04 MED ORDER — FUROSEMIDE 20 MG PO TABS: 20.0000 mg | ORAL_TABLET | Freq: Every day | ORAL | 3 refills | Status: DC

## 2021-10-04 NOTE — Progress Notes (Signed)
Office Visit    Patient Name: Tanya Ho Date of Encounter: 10/05/2021  PCP:  Shon Baton, MD   [place]  Cardiologist:  Freada Bergeron, MD  Advanced Practice Provider:  No care team member to display Electrophysiologist:  Will Meredith Leeds, MD   Chief Complaint    Tanya Ho is a 87 y.o. female with a hx of CAD, atrial fibrillation on Eliquis, HTN, HLD, GERD, LE edema, bilateral carotid artery disease, mild to moderate TR presents today for follow up of edema   Past Medical History    Past Medical History:  Diagnosis Date   Back pain    Bruises easily    Coronary artery disease    NONOBSTRUCTIVE   Fatigue    Hyperlipidemia    Hypertension    Knee pain    OA (osteoarthritis)    Obesity    Palpitations    SOB (shortness of breath)    Past Surgical History:  Procedure Laterality Date   BREAST BIOPSY     LEFT BREAST   CARDIAC CATHETERIZATION  05/11/2006   OVERALL CARDIAC SIZE AND SILHOUETTE ARE NORMAL. EF ESTIMATED 60%   KIDNEY STONES  1982   KNEE ARTHROSCOPY  09/22/2008   VAGINAL HYSTERECTOMY      Allergies  Allergies  Allergen Reactions   Codeine    Demerol    Morphine And Related     History of Present Illness    Tanya Ho is a 87 y.o. female with a hx of CAD, atrial fibrillation on Eliquis, HTN, HLD, GERD, LE edema, bilateral carotid artery disease, mild to moderate TR last seen 08/30/21 by Dr. Curt Bears 08/30/21.  Previous patient of Truitt Merle, NP who has since established with Dr. Johney Frame. Most recent echo 2019 with normal LVEF, mild to moderate TR. Carotid duplex 12/2020 with bilateral 1-39% stenosis.   Seen by Dr. Curt Bears 08/30/21 noting increased dyspnea and edema. She was started on Lasix 20mg  QD. Via phone message it was increased to 20mg  BID.   Presents today for follow up with her daughter. Weight down 15 lbs over 1 month. Reports blood pressure has been well controlled at home. Bends over and she feels  she is spinning. Discussed orthostatic hypotension and precautions. Notes dizziness and spinning with certain head movements or laying in bed, has been diagnosed with vertigo by primary care nad recommended to use meclizine OTC. Discussed Epley maneuver. Reports LE edema is resolved. Not wearing compression socks but does try to elevate legs. Drinks less than 2L fluid per day.   EKGs/Labs/Other Studies Reviewed:   The following studies were reviewed today:  Echo 02/2018  - Left ventricle: The cavity size was normal. Wall thickness was    normal. Systolic function was normal. The estimated ejection    fraction was in the range of 55% to 60%.  - Mitral valve: Calcified annulus.  - Right atrium: The atrium was mildly dilated.  - Tricuspid valve: There was mild-moderate regurgitation.  - Pulmonary arteries: PA peak pressure: 37 mm Hg (S).   Carotid duplex 01/11/21  Summary:  Right Carotid: Velocities in the right ICA are consistent with a 1-39%  stenosis.                Non-hemodynamically significant plaque <50% noted in the  CCA.   Left Carotid: Velocities in the left ICA are consistent with a 1-39%  stenosis.   Vertebrals: Bilateral vertebral arteries demonstrate antegrade flow.  Subclavians:  Right subclavian artery was stenotic. Right subclavian artery  flow              was disturbed. Normal flow hemodynamics were seen in the  right              subclavian artery.   *See table(s) above for measurements and observations.  Suggest follow up PRN.  EKG:  No EKG today  Recent Labs: 08/30/2021: NT-Pro BNP 1,496 10/04/2021: BNP 119.3; BUN 26; Creatinine, Ser 1.35; Potassium 3.5; Sodium 141  Recent Lipid Panel    Component Value Date/Time   CHOL 187 03/04/2018 1523   TRIG 267 (H) 03/04/2018 1523   HDL 44 03/04/2018 1523   CHOLHDL 4.3 03/04/2018 1523   LDLCALC 90 03/04/2018 1523    Risk Assessment/Calculations:   CHA2DS2-VASc Score = 5   This indicates a 7.2% annual risk of  stroke. The patient's score is based upon: CHF History: 0 HTN History: 1 Diabetes History: 0 Stroke History: 0 Vascular Disease History: 1 Age Score: 2 Gender Score: 1   Home Medications   Current Meds  Medication Sig   ALPRAZolam (XANAX) 1 MG tablet Take 1 mg by mouth at bedtime as needed.   apixaban (ELIQUIS) 5 MG TABS tablet TAKE 1 TABLET TWICE DAILY   ergocalciferol (VITAMIN D2) 50000 UNITS capsule Take 50,000 Units by mouth once a week.   metoprolol (LOPRESSOR) 100 MG tablet Take 1 tablet (100 mg total) by mouth 2 (two) times daily.   nitroGLYCERIN (NITROSTAT) 0.4 MG SL tablet Place 1 tablet (0.4 mg total) under the tongue every 5 (five) minutes as needed for chest pain.   traMADol (ULTRAM) 50 MG tablet TAKE 1-2 TABLETS BY MOUTH EVERY 6-8 HOURS AS NEEDED FOR PAIN   triamterene-hydrochlorothiazide (DYAZIDE) 37.5-25 MG per capsule Take 1 capsule by mouth every morning.   [DISCONTINUED] furosemide (LASIX) 20 MG tablet Take 1 tablet (20 mg total) by mouth 2 (two) times daily.     Review of Systems      All other systems reviewed and are otherwise negative except as noted above.  Physical Exam    VS:  BP 120/70   Pulse 78   Ht 5\' 6"  (1.676 m)   Wt 185 lb (83.9 kg)   SpO2 96%   BMI 29.86 kg/m  , BMI Body mass index is 29.86 kg/m.  Wt Readings from Last 3 Encounters:  10/04/21 185 lb (83.9 kg)  08/30/21 200 lb 6.4 oz (90.9 kg)  05/11/21 197 lb 12.8 oz (89.7 kg)     GEN: Well nourished, well developed, in no acute distress. HEENT: normal. Neck: Supple, no JVD, carotid bruits, or masses. Cardiac: IRIR, no murmurs, rubs, or gallops. No clubbing, cyanosis. Trace pedal edema bilaterally..  Radials/PT 2+ and equal bilaterally.  Respiratory:  Respirations regular and unlabored, clear to auscultation bilaterally. GI: Soft, nontender, nondistended. MS: No deformity or atrophy. Skin: Warm and dry, no rash. Neuro:  Strength and sensation are intact. Psych: Normal  affect.  Assessment & Plan    LE edema - Weight down 15 lbs over one month. Has been taking Lasix 20mg  BID. Notes symptoms consistent with orthostatic hypotension. Concern for dehydration. BMP, BNP today. Reduce Lasix to 20mg  QD. If significant elevation in creatinine, could consider discontinuation of HCTZ. Elevation of legs, compression socks, low salt diet encouraged.   HTN - BP well controlled. Continue current antihypertensive regimen.    Permanent atrial fib / Chronic anticoagulation - Rate controlled today. Asymptomatic with no palpitations.  CHA2DS2-VASc Score = 5 [CHF History: 0, HTN History: 1, Diabetes History: 0, Stroke History: 0, Vascular Disease History: 1, Age Score: 2, Gender Score: 1].  Therefore, the patient's annual risk of stroke is 7.2 %.   Continue Eliquis 5mg  BID, metoprolol 100mg  BID. Does not meet dose reduction criteria.   Mild to moderate TR - Noted by echo 02/2018. Continue optimal BP and volume control.   CAD - Stable with no anginal symptoms. No indication for ischemic evaluation.  Continue Metoprolol. No aspirin due to chronic anticoagulation. Heart healthy diet and regular cardiovascular exercise encouraged.    Bilateral carotid stenosis - Carotid duplex 12/2020 with bilateral 1-39% stenosis.   Disposition: Follow up in 2 month(s) with Dr. Johney Frame or APP.  Signed, Loel Dubonnet, NP 10/05/2021, 6:59 PM [place]

## 2021-10-04 NOTE — Patient Instructions (Signed)
Medication Instructions:  Your physician has recommended you make the following change in your medication:   CHANGE your Furosemide to one 20mg  tablet in the morning   Double check your medications to ensure you have plenty of Amlodipine (Norvasc) 2.5mg  tablets at home.   *If you need a refill on your cardiac medications before your next appointment, please call your pharmacy*   Lab Work: Your physician recommends that you return for lab work today: BMP, BNP  If you have labs (blood work) drawn today and your tests are completely normal, you will receive your results only by: MyChart Message (if you have MyChart) OR A paper copy in the mail If you have any lab test that is abnormal or we need to change your treatment, we will call you to review the results.   Testing/Procedures: None ordered today.    Follow-Up: At [facility], you and your health needs are our priority.  As part of our continuing mission to provide you with exceptional heart care, we have created designated Provider Care Teams.  These Care Teams include your primary Cardiologist (physician) and Advanced Practice Providers (APPs -  Physician Assistants and Nurse Practitioners) who all work together to provide you with the care you need, when you need it.  We recommend signing up for the patient portal called "MyChart".  Sign up information is provided on this After Visit Summary.  MyChart is used to connect with patients for Virtual Visits (Telemedicine).  Patients are able to view lab/test results, encounter notes, upcoming appointments, etc.  Non-urgent messages can be sent to your provider as well.   To learn more about what you can do with MyChart, go to [email or web address].    Your next appointment:   11/28/2021 with Melina Copa, PA as scheduled   Other Instructions  How to Perform the Epley Maneuver The Epley maneuver is an exercise that relieves symptoms of vertigo. Vertigo is the feeling that you or  your surroundings are moving when they are not. When you feel vertigo, you may feel like the room is spinning and may have trouble walking. The Epley maneuver is used for a type of vertigo caused by a calcium deposit in a part of the inner ear. The maneuver involves changing head positions to help the deposit move out of the area. You can do this maneuver at home whenever you have symptoms of vertigo. You can repeat it in 24 hours if your vertigo has not gone away. Even though the Epley maneuver may relieve your vertigo for a few weeks, it is possible that your symptoms will return. This maneuver relieves vertigo, but it does not relieve dizziness. What are the risks? If it is done correctly, the Epley maneuver is considered safe. Sometimes it can lead to dizziness or nausea that goes away after a short time. If you develop other symptoms--such as changes in vision, weakness, or numbness--stop doing the maneuver and call your health care provider. Supplies needed: A bed or table. A pillow. How to do the Epley maneuver   Sit on the edge of a bed or table with your back straight and your legs extended or hanging over the edge of the bed or table. Turn your head halfway toward the affected ear or side as told by your health care provider. Lie backward quickly with your head turned until you are lying flat on your back. Your head should dangle (head-hanging position). You may want to position a pillow under your shoulders. Hold this  position for at least 30 seconds. If you feel dizzy or have symptoms of vertigo, continue to hold the position until the symptoms stop. Turn your head to the opposite direction until your unaffected ear is facing down. Your head should continue to dangle. Hold this position for at least 30 seconds. If you feel dizzy or have symptoms of vertigo, continue to hold the position until the symptoms stop. Turn your whole body to the same side as your head so that you are positioned  on your side. Your head will now be nearly facedown and no longer needs to dangle. Hold for at least 30 seconds. If you feel dizzy or have symptoms of vertigo, continue to hold the position until the symptoms stop. Sit back up. You can repeat the maneuver in 24 hours if your vertigo does not go away. Follow these instructions at home: For 24 hours after doing the Epley maneuver: Keep your head in an upright position. When lying down to sleep or rest, keep your head raised (elevated) with two or more pillows. Avoid excessive neck movements. Activity Do not drive or use machinery if you feel dizzy. After doing the Epley maneuver, return to your normal activities as told by your health care provider. Ask your health care provider what activities are safe for you. General instructions Drink enough fluid to keep your urine pale yellow. Do not drink alcohol. Take over-the-counter and prescription medicines only as told by your health care provider. Keep all follow-up visits. This is important. Preventing vertigo symptoms Ask your health care provider if there is anything you should do at home to prevent vertigo. He or she may recommend that you: Keep your head elevated with two or more pillows while you sleep. Do not sleep on the side of your affected ear. Get up slowly from bed. Avoid sudden movements during the day. Avoid extreme head positions or movement, such as looking up or bending over. Contact a health care provider if: Your vertigo gets worse. You have other symptoms, including: Nausea. Vomiting. Headache. Get help right away if you: Have vision changes. Have a headache or neck pain that is severe or getting worse. Cannot stop vomiting. Have new numbness or weakness in any part of your body. These symptoms may represent a serious problem that is an emergency. Do not wait to see if the symptoms will go away. Get medical help right away. Call your local emergency services (911 in  the [place] Do not drive yourself to the hospital. Summary Vertigo is the feeling that you or your surroundings are moving when they are not. The Epley maneuver is an exercise that relieves symptoms of vertigo. If the Epley maneuver is done correctly, it is considered safe. This information is not intended to replace advice given to you by your health care provider. Make sure you discuss any questions you have with your health care provider. Document Revised: 11/03/2020 Document Reviewed: 11/03/2020 Elsevier Patient Education  2022 [place].  Orthostatic Hypotension Blood pressure is a measurement of how strongly, or weakly, your circulating blood is pressing against the walls of your arteries. Orthostatic hypotension is a drop in blood pressure that can happen when you change positions, such as when you go from lying down to standing. Arteries are blood vessels that carry blood from your heart throughout your body. When blood pressure is too low, you may not get enough blood to your brain or to the rest of your organs. Orthostatic hypotension can cause light-headedness, sweating,  rapid heartbeat, blurred vision, and fainting. These symptoms require further investigation into the cause. What are the causes? Orthostatic hypotension can be caused by many things, including: Sudden changes in posture, such as standing up quickly after you have been sitting or lying down. Loss of blood (anemia) or loss of body fluids (dehydration). Heart problems, neurologic problems, or hormone problems. Pregnancy. Aging. The risk for this condition increases as you get older. Severe infection (sepsis). Certain medicines, such as medicines for high blood pressure or medicines that make the body lose excess fluids (diuretics). What are the signs or symptoms? Symptoms of this condition may include: Weakness, light-headedness, or dizziness. Sweating. Blurred vision. Tiredness (fatigue). Rapid  heartbeat. Fainting, in severe cases. How is this diagnosed? This condition is diagnosed based on: Your symptoms and medical history. Your blood pressure measurements. Your health care provider will check your blood pressure when you are: Lying down. Sitting. Standing. A blood pressure reading is recorded as two numbers, such as "120 over 80" (or 120/80). The first ("top") number is called the systolic pressure. It is a measure of the pressure in your arteries as your heart beats. The second ("bottom") number is called the diastolic pressure. It is a measure of the pressure in your arteries when your heart relaxes between beats. Blood pressure is measured in a unit called mmHg. Healthy blood pressure for most adults is 120/80 mmHg. Orthostatic hypotension is defined as a 20 mmHg drop in systolic pressure or a 10 mmHg drop in diastolic pressure within 3 minutes of standing. Other information or tests that may be used to diagnose orthostatic hypotension include: Your other vital signs, such as your heart rate and temperature. Blood tests. An electrocardiogram (ECG) or echocardiogram. A Holter monitor. This is a device you wear that records your heart rhythm continuously, usually for 24-48 hours. Tilt table test. For this test, you will be safely secured to a table that moves you from a lying position to an upright position. Your heart rhythm and blood pressure will be monitored during the test. How is this treated? This condition may be treated by: Changing your diet. This may involve eating more salt (sodium) or drinking more water. Changing the dosage of certain medicines you are taking that might be lowering your blood pressure. Correcting the underlying reason for the orthostatic hypotension. Wearing compression stockings. Taking medicines to raise your blood pressure. Avoiding actions that trigger symptoms. Follow these instructions at home: Medicines Take over-the-counter and  prescription medicines only as told by your health care provider. Follow instructions from your health care provider about changing the dosage of your current medicines, if this applies. Do not stop or adjust any of your medicines on your own. Eating and drinking  Drink enough fluid to keep your urine pale yellow. Eat extra salt only as directed. Do not add extra salt to your diet unless advised by your health care provider. Eat frequent, small meals. Avoid standing up suddenly after eating. General instructions  Get up slowly from lying down or sitting positions. This gives your blood pressure a chance to adjust. Avoid hot showers and excessive heat as directed by your health care provider. Engage in regular physical activity as directed by your health care provider. If you have compression stockings, wear them as told. Keep all follow-up visits. This is important. Contact a health care provider if: You have a fever for more than 2-3 days. You feel more thirsty than usual. You feel dizzy or weak. Get help right  away if: You have chest pain. You have a fast or irregular heartbeat. You become sweaty or feel light-headed. You feel short of breath. You faint. You have any symptoms of a stroke. "BE FAST" is an easy way to remember the main warning signs of a stroke: B - Balance. Signs are dizziness, sudden trouble walking, or loss of balance. E - Eyes. Signs are trouble seeing or a sudden change in vision. F - Face. Signs are sudden weakness or numbness of the face, or the face or eyelid drooping on one side. A - Arms. Signs are weakness or numbness in an arm. This happens suddenly and usually on one side of the body. S - Speech. Signs are sudden trouble speaking, slurred speech, or trouble understanding what people say. T - Time. Time to call emergency services. Write down what time symptoms started. You have other signs of a stroke, such as: A sudden, severe headache with no known  cause. Nausea or vomiting. Seizure. These symptoms may represent a serious problem that is an emergency. Do not wait to see if the symptoms will go away. Get medical help right away. Call your local emergency services (911 in the [place] Do not drive yourself to the hospital. Summary Orthostatic hypotension is a sudden drop in blood pressure. It can cause light-headedness, sweating, rapid heartbeat, blurred vision, and fainting. Orthostatic hypotension can be diagnosed by having your blood pressure taken while lying down, sitting, and then standing. Treatment may involve changing your diet, wearing compression stockings, sitting up slowly, adjusting your medicines, or correcting the underlying reason for the orthostatic hypotension. Get help right away if you have chest pain, a fast or irregular heartbeat, or symptoms of a stroke. This information is not intended to replace advice given to you by your health care provider. Make sure you discuss any questions you have with your health care provider. Document Revised: 02/17/2021 Document Reviewed: 02/17/2021 Elsevier Patient Education  [place].

## 2021-10-05 LAB — BASIC METABOLIC PANEL
BUN/Creatinine Ratio: 19 (ref 12–28)
BUN: 26 mg/dL (ref 10–36)
CO2: 21 mmol/L (ref 20–29)
Calcium: 10.3 mg/dL (ref 8.7–10.3)
Chloride: 97 mmol/L (ref 96–106)
Creatinine, Ser: 1.35 mg/dL — ABNORMAL HIGH (ref 0.57–1.00)
Glucose: 155 mg/dL — ABNORMAL HIGH (ref 70–99)
Potassium: 3.5 mmol/L (ref 3.5–5.2)
Sodium: 141 mmol/L (ref 134–144)
eGFR: 37 mL/min/{1.73_m2} — ABNORMAL LOW (ref >59)

## 2021-10-05 LAB — BRAIN NATRIURETIC PEPTIDE: BNP: 119.3 pg/mL — ABNORMAL HIGH (ref 0.0–100.0)

## 2021-10-06 ENCOUNTER — Telehealth: Payer: Self-pay | Admitting: *Deleted

## 2021-10-06 DIAGNOSIS — N289 Disorder of kidney and ureter, unspecified: Secondary | ICD-10-CM

## 2021-10-06 DIAGNOSIS — Z79899 Other long term (current) drug therapy: Secondary | ICD-10-CM

## 2021-10-06 NOTE — Telephone Encounter (Signed)
-----   Message from Loel Dubonnet, NP sent at 10/05/2021  7:20 PM EDT ----- BNP with only very mild volume overload. Kidney function worse than baseline. Normal potassium.   Recommend hold Lasix for 2 days then resume at 20mg  daily with repeat BMP in 1 week.

## 2021-10-06 NOTE — Telephone Encounter (Signed)
The patient has been notified of the result and verbalized understanding.  All questions (if any) were answered. Darrell Jewel, RN 10/06/2021 1:45 PM    Recommend hold Lasix for 2 days then resume at 20mg  daily with repeat BMP in 1 week. Order placed for lab work, patient to come back to [employer]

## 2021-10-13 DIAGNOSIS — E785 Hyperlipidemia, unspecified: Secondary | ICD-10-CM | POA: Diagnosis not present

## 2021-10-13 DIAGNOSIS — E1122 Type 2 diabetes mellitus with diabetic chronic kidney disease: Secondary | ICD-10-CM | POA: Diagnosis not present

## 2021-10-13 DIAGNOSIS — E559 Vitamin D deficiency, unspecified: Secondary | ICD-10-CM | POA: Diagnosis not present

## 2021-11-07 ENCOUNTER — Ambulatory Visit (HOSPITAL_BASED_OUTPATIENT_CLINIC_OR_DEPARTMENT_OTHER): Payer: Medicare Other | Admitting: Family

## 2021-11-09 ENCOUNTER — Other Ambulatory Visit: Payer: Self-pay | Admitting: *Deleted

## 2021-11-09 DIAGNOSIS — I4891 Unspecified atrial fibrillation: Secondary | ICD-10-CM

## 2021-11-09 MED ORDER — APIXABAN 5 MG PO TABS
5.0000 mg | ORAL_TABLET | Freq: Two times a day (BID) | ORAL | 1 refills | Status: DC
Start: 2021-11-09 — End: 2022-07-17

## 2021-11-09 NOTE — Telephone Encounter (Signed)
Eliquis 5mg  paper refill request received. Patient is 87 years old, weight-83.9kg, Crea-1.35 on 10/04/2021, Diagnosis-Afib, and last seen by Laurann Montana, NP on 10/04/2021. Dose is appropriate based on dosing criteria. Will send in refill to requested pharmacy.

## 2021-11-28 ENCOUNTER — Ambulatory Visit: Payer: Medicare Other | Admitting: Physician Assistant

## 2022-04-25 DIAGNOSIS — F419 Anxiety disorder, unspecified: Secondary | ICD-10-CM | POA: Diagnosis not present

## 2022-04-25 DIAGNOSIS — I2510 Atherosclerotic heart disease of native coronary artery without angina pectoris: Secondary | ICD-10-CM | POA: Diagnosis not present

## 2022-04-25 DIAGNOSIS — E785 Hyperlipidemia, unspecified: Secondary | ICD-10-CM | POA: Diagnosis not present

## 2022-04-25 DIAGNOSIS — I6521 Occlusion and stenosis of right carotid artery: Secondary | ICD-10-CM | POA: Diagnosis not present

## 2022-04-25 DIAGNOSIS — I2721 Secondary pulmonary arterial hypertension: Secondary | ICD-10-CM | POA: Diagnosis not present

## 2022-04-25 DIAGNOSIS — I1310 Hypertensive heart and chronic kidney disease without heart failure, with stage 1 through stage 4 chronic kidney disease, or unspecified chronic kidney disease: Secondary | ICD-10-CM | POA: Diagnosis not present

## 2022-04-25 DIAGNOSIS — G4700 Insomnia, unspecified: Secondary | ICD-10-CM | POA: Diagnosis not present

## 2022-04-25 DIAGNOSIS — D692 Other nonthrombocytopenic purpura: Secondary | ICD-10-CM | POA: Diagnosis not present

## 2022-04-25 DIAGNOSIS — E1122 Type 2 diabetes mellitus with diabetic chronic kidney disease: Secondary | ICD-10-CM | POA: Diagnosis not present

## 2022-04-25 DIAGNOSIS — M1990 Unspecified osteoarthritis, unspecified site: Secondary | ICD-10-CM | POA: Diagnosis not present

## 2022-04-25 DIAGNOSIS — I480 Paroxysmal atrial fibrillation: Secondary | ICD-10-CM | POA: Diagnosis not present

## 2022-04-25 DIAGNOSIS — N1831 Chronic kidney disease, stage 3a: Secondary | ICD-10-CM | POA: Diagnosis not present

## 2022-05-29 DIAGNOSIS — E1122 Type 2 diabetes mellitus with diabetic chronic kidney disease: Secondary | ICD-10-CM | POA: Diagnosis not present

## 2022-05-29 DIAGNOSIS — R109 Unspecified abdominal pain: Secondary | ICD-10-CM | POA: Diagnosis not present

## 2022-05-29 DIAGNOSIS — N390 Urinary tract infection, site not specified: Secondary | ICD-10-CM | POA: Diagnosis not present

## 2022-05-29 DIAGNOSIS — D72829 Elevated white blood cell count, unspecified: Secondary | ICD-10-CM | POA: Diagnosis not present

## 2022-05-29 DIAGNOSIS — I480 Paroxysmal atrial fibrillation: Secondary | ICD-10-CM | POA: Diagnosis not present

## 2022-05-29 DIAGNOSIS — R319 Hematuria, unspecified: Secondary | ICD-10-CM | POA: Diagnosis not present

## 2022-05-29 DIAGNOSIS — N1831 Chronic kidney disease, stage 3a: Secondary | ICD-10-CM | POA: Diagnosis not present

## 2022-06-05 DIAGNOSIS — N390 Urinary tract infection, site not specified: Secondary | ICD-10-CM | POA: Diagnosis not present

## 2022-06-05 DIAGNOSIS — N1831 Chronic kidney disease, stage 3a: Secondary | ICD-10-CM | POA: Diagnosis not present

## 2022-06-05 DIAGNOSIS — I480 Paroxysmal atrial fibrillation: Secondary | ICD-10-CM | POA: Diagnosis not present

## 2022-06-05 DIAGNOSIS — K219 Gastro-esophageal reflux disease without esophagitis: Secondary | ICD-10-CM | POA: Diagnosis not present

## 2022-06-05 DIAGNOSIS — R109 Unspecified abdominal pain: Secondary | ICD-10-CM | POA: Diagnosis not present

## 2022-06-05 DIAGNOSIS — E1122 Type 2 diabetes mellitus with diabetic chronic kidney disease: Secondary | ICD-10-CM | POA: Diagnosis not present

## 2022-06-05 DIAGNOSIS — D72829 Elevated white blood cell count, unspecified: Secondary | ICD-10-CM | POA: Diagnosis not present

## 2022-06-11 NOTE — Progress Notes (Deleted)
Cardiology Office Note:    Date:  06/11/2022   ID:  Tanya Ho, DOB 06/09/1926, MRN [id number]  PCP:  Shon Baton, MD   [facility] Providers Cardiologist:  Freada Bergeron, MD Electrophysiologist:  Will Meredith Leeds, MD     Referring MD: Shon Baton, MD    History of Present Illness:    Tanya Ho is a 88 y.o. female with a hx of CAD, afib on eliquis HTN, HLD, and GERD who was previously followed by Truitt Merle who now returns to clinic for follow-up.  Previous patient of Truitt Merle, NP who has since established with me. Most recent echo 2019 with normal LVEF, mild to moderate TR. Carotid duplex 12/2020 with bilateral 1-39% stenosis.  Seen by Dr. Curt Bears 08/30/21 noting increased dyspnea and edema. She was started on Lasix '20mg'$  QD. Via phone message it was increased to '20mg'$  BID.    Was last seen in by Laurann Montana on 09/2021 where her symptoms had significantly improved. Weight was down 15lbs.   Today, ***   Past Medical History:  Diagnosis Date   Back pain    Bruises easily    Coronary artery disease    NONOBSTRUCTIVE   Fatigue    Hyperlipidemia    Hypertension    Knee pain    OA (osteoarthritis)    Obesity    Palpitations    SOB (shortness of breath)     Past Surgical History:  Procedure Laterality Date   BREAST BIOPSY     LEFT BREAST   CARDIAC CATHETERIZATION  05/11/2006   OVERALL CARDIAC SIZE AND SILHOUETTE ARE NORMAL. EF ESTIMATED 60%   KIDNEY STONES  1982   KNEE ARTHROSCOPY  09/22/2008   VAGINAL HYSTERECTOMY      Current Medications: No outpatient medications have been marked as taking for the 06/23/22 encounter (Appointment) with Freada Bergeron, MD.     Allergies:   Codeine, Demerol, and Morphine and related   Social History   Socioeconomic History   Marital status: Widowed    Spouse name: Not on file   Number of children: Not on file   Years of education: Not on file   Highest education level: Not on file  Occupational  History   Not on file  Tobacco Use   Smoking status: Never   Smokeless tobacco: Never  Vaping Use   Vaping Use: Never used  Substance and Sexual Activity   Alcohol use: No   Drug use: No   Sexual activity: Not Currently  Other Topics Concern   Not on file  Social History Narrative   Not on file   Social Determinants of Health   Financial Resource Strain: Not on file  Food Insecurity: Not on file  Transportation Needs: Not on file  Physical Activity: Not on file  Stress: Not on file  Social Connections: Not on file     Family History: The patient's family history includes Heart attack in her father; Heart disease in her mother; Stroke in her mother.  Review of Systems  Constitutional:  Positive for malaise/fatigue. Negative for chills and fever.  HENT:  Negative for congestion.   Respiratory:  Negative for shortness of breath.   Cardiovascular:  Positive for palpitations and leg swelling (mild ). Negative for chest pain, orthopnea and PND.  Gastrointestinal:  Negative for blood in stool, constipation, nausea and vomiting.  Genitourinary:  Negative for dysuria and hematuria.  Skin:  Negative for rash.  Neurological:  Negative for dizziness  and headaches.      EKGs/Labs/Other Studies Reviewed:    The following studies were reviewed today: Echo Study Conclusions 02/2018 - Left ventricle: The cavity size was normal. Wall thickness was   normal. Systolic function was normal. The estimated ejection   fraction was in the range of 55% to 60%. - Mitral valve: Calcified annulus. - Right atrium: The atrium was mildly dilated. - Tricuspid valve: There was mild-moderate regurgitation. - Pulmonary arteries: PA peak pressure: 37 mm Hg (S).  EKG:  05/11/2021- Atrial fibrillation with HR 64   Recent Labs: 08/30/2021: NT-Pro BNP 1,496 10/04/2021: BNP 119.3; BUN 26; Creatinine, Ser 1.35; Potassium 3.5; Sodium 141  Recent Lipid Panel    Component Value Date/Time   CHOL 187  03/04/2018 1523   TRIG 267 (H) 03/04/2018 1523   HDL 44 03/04/2018 1523   CHOLHDL 4.3 03/04/2018 1523   LDLCALC 90 03/04/2018 1523     Risk Assessment/Calculations:    CHA2DS2-VASc Score =    {This indicates a  % annual risk of stroke. The patient's score is based upon:      Physical Exam:    VS:  There were no vitals taken for this visit.    Wt Readings from Last 3 Encounters:  10/04/21 185 lb (83.9 kg)  08/30/21 200 lb 6.4 oz (90.9 kg)  05/11/21 197 lb 12.8 oz (89.7 kg)     GEN:  Elderly female, NAD HEENT: Normal NECK: No JVD; No carotid bruits CARDIAC: Irregularly Irregular rhythm, no murmurs, rubs, gallops RESPIRATORY:  Clear to auscultation without rales, wheezing or rhonchi  ABDOMEN: Soft, non-tender, non-distended MUSCULOSKELETAL:  1+ pitting edema to the ankles ; No deformity  SKIN: Warm and dry NEUROLOGIC:  Alert and oriented x 3 PSYCHIATRIC:  Normal affect   ASSESSMENT:    No diagnosis found.  PLAN:    In order of problems listed above:  #Chronic Diastolic HF: Last TTE 7673 with LVEF 55-60%. Was having worsening LE edema in 08/2021 found to have pro-BNP 1000. Was started on lasix '20mg'$  BID with improvement which was lowered to '20mg'$  daily. Currently, *** -Continue lasix '20mg'$  daily -Low Na diet  #Permanent Afib: CHADs-vasc 5. On eliquis without bleeding issues. Pursuing rate control strategy as patient is doing well with no symptoms.  -Continue apixaban '5mg'$  BID -Contnue metop '100mg'$  BID  #CAD: On medical management. No history of PCI. Was previously on plavix but was stopped due to need for [facility] -Continue mtop '100mg'$  BID  #Mild carotid disease: Right carotid artery 1-39% 12/2020.  -No further imaging needed  #HTN: Well controlled at home.  -Continue amlodipine 2.'5mg'$  daily -Continue triamerene-HCTZ 37.'5mg'$ -'25mg'$  daily   Medication Adjustments/Labs and Tests Ordered: Current medicines are reviewed at length with the patient today.  Concerns  regarding medicines are outlined above.  No orders of the defined types were placed in this encounter.  No orders of the defined types were placed in this encounter.   There are no Patient Instructions on file for this visit.      I,Stephanie Williams,acting as a Education administrator for Freada Bergeron, MD.,have documented all relevant documentation on the behalf of Freada Bergeron, MD,as directed by  Freada Bergeron, MD while in the presence of Freada Bergeron, MD.   I, Freada Bergeron, MD, have reviewed all documentation for this visit. The documentation on 06/11/22 for the exam, diagnosis, procedures, and orders are all accurate and complete.   Signed, Freada Bergeron, MD  06/11/2022 4:13 PM  [facility]

## 2022-06-23 ENCOUNTER — Ambulatory Visit: Payer: Medicare Other | Admitting: Cardiology

## 2022-07-13 NOTE — Progress Notes (Signed)
Cardiology Office Note:    Date:  07/14/2022   ID:  Tanya Ho, DOB [birth year], MRN [id number]  PCP:  Tanya Baton, MD   [facility] Providers Cardiologist:  Tanya Bergeron, MD Electrophysiologist:  Tanya Meredith Leeds, MD      Referring MD: Tanya Baton, MD    History of Present Illness:    Tanya Ho is a 88 y.o. female with a hx of CAD, afib on eliquis HTN, HLD, and GERD who was previously followed by Tanya Ho who now returns to clinic for follow-up.  Previous patient of Tanya Merle, NP who has since established with me. Most recent echo 2019 with normal LVEF, mild to moderate TR. Carotid duplex 12/2020 with bilateral 1-39% stenosis.  Seen by Dr. Curt Bears 08/30/21 noting increased dyspnea and edema. She was started on Lasix '20mg'$  QD. Via phone message it was increased to '20mg'$  BID.    Was last seen in by Laurann Montana on 09/2021 where her symptoms had significantly improved. Weight was down 15lbs.   Today, the patient overall feels okay. Able to complete her ADLs. Has some fatigue with exertion but is able to sit and rest and then resume activity. She is concerned that last Wednesday, she had an episode of pain in her chest. BP at that time was 195/107. Her son came over and noted she was in Afib. She took a xanax and symptoms resolved. Otherwise, her blood pressure is pretty well controlled at home 130/80s. Other than that episode, she has had no significant chest pain, SOB, orthopnea, PND. No significant lightheadedness or dizziness. Has some LE edema that is well controlled on lasix. States she has become more nervous and anxious especially at night and symptoms tend to worsen in the evenings due to living alone. Has a very helpful family who check on her frequently.   Past Medical History:  Diagnosis Date   Back pain    Bruises easily    Coronary artery disease    NONOBSTRUCTIVE   Fatigue    Hyperlipidemia    Hypertension    Knee pain    OA (osteoarthritis)     Obesity    Palpitations    SOB (shortness of breath)     Past Surgical History:  Procedure Laterality Date   BREAST BIOPSY     LEFT BREAST   CARDIAC CATHETERIZATION  05/11/2006   OVERALL CARDIAC SIZE AND SILHOUETTE ARE NORMAL. EF ESTIMATED 60%   KIDNEY STONES  1982   KNEE ARTHROSCOPY  09/22/2008   VAGINAL HYSTERECTOMY      Current Medications: Current Meds  Medication Sig   ALPRAZolam (XANAX) 1 MG tablet Take 1 mg by mouth at bedtime as needed.   amLODipine (NORVASC) 2.5 MG tablet 2.5 mg daily.   apixaban (ELIQUIS) 5 MG TABS tablet Take 1 tablet (5 mg total) by mouth 2 (two) times daily.   ergocalciferol (VITAMIN D2) 50000 UNITS capsule Take 50,000 Units by mouth once a week.   furosemide (LASIX) 20 MG tablet Take 1 tablet (20 mg total) by mouth daily.   metoprolol (LOPRESSOR) 100 MG tablet Take 1 tablet (100 mg total) by mouth 2 (two) times daily.   nitroGLYCERIN (NITROSTAT) 0.4 MG SL tablet Place 1 tablet (0.4 mg total) under the tongue every 5 (five) minutes as needed for chest pain.   traMADol (ULTRAM) 50 MG tablet TAKE 1-2 TABLETS BY MOUTH EVERY 6-8 HOURS AS NEEDED FOR PAIN   triamterene-hydrochlorothiazide (DYAZIDE) 37.5-25 MG per capsule Take 1  capsule by mouth every morning.     Allergies:   Codeine, Demerol, and Morphine and related   Social History   Socioeconomic History   Marital status: Widowed    Spouse name: Not on file   Number of children: Not on file   Years of education: Not on file   Highest education level: Not on file  Occupational History   Not on file  Tobacco Use   Smoking status: Never   Smokeless tobacco: Never  Vaping Use   Vaping Use: Never used  Substance and Sexual Activity   Alcohol use: No   Drug use: No   Sexual activity: Not Currently  Other Topics Concern   Not on file  Social History Narrative   Not on file   Social Determinants of Health   Financial Resource Strain: Not on file  Food Insecurity: Not on file   Transportation Needs: Not on file  Physical Activity: Not on file  Stress: Not on file  Social Connections: Not on file     Family History: The patient's family history includes Heart attack in her father; Heart disease in her mother; Stroke in her mother.  Review of Systems  Constitutional:  Positive for malaise/fatigue. Negative for chills and fever.  HENT:  Negative for congestion.   Respiratory:  Negative for shortness of breath.   Cardiovascular:  Positive for chest pain, palpitations and leg swelling (mild ). Negative for orthopnea and PND.  Gastrointestinal:  Negative for blood in stool, constipation, nausea and vomiting.  Genitourinary:  Negative for dysuria and hematuria.  Skin:  Negative for rash.  Neurological:  Negative for dizziness and headaches.  Psychiatric/Behavioral:  The patient is nervous/anxious.       EKGs/Labs/Other Studies Reviewed:    The following studies were reviewed today: Echo Study Conclusions 02/2018 - Left ventricle: The cavity size was normal. Wall thickness was   normal. Systolic function was normal. The estimated ejection   fraction was in the range of 55% to 60%. - Mitral valve: Calcified annulus. - Right atrium: The atrium was mildly dilated. - Tricuspid valve: There was mild-moderate regurgitation. - Pulmonary arteries: PA peak pressure: 37 mm Hg (S).  EKG:  07/14/22: Afib with HR 53  Recent Labs: 08/30/2021: NT-Pro BNP 1,496 10/04/2021: BNP 119.3; BUN 26; Creatinine, Ser 1.35; Potassium 3.5; Sodium 141  Recent Lipid Panel    Component Value Date/Time   CHOL 187 03/04/2018 1523   TRIG 267 (H) 03/04/2018 1523   HDL 44 03/04/2018 1523   CHOLHDL 4.3 03/04/2018 1523   LDLCALC 90 03/04/2018 1523     Risk Assessment/Calculations:    CHA2DS2-VASc Score =    {This indicates a  % annual risk of stroke. The patient's score is based upon:      Physical Exam:    VS:  BP (!) 144/90   Pulse 67   Ht '5\' 6"'$  (1.676 m)   Wt 179 lb  6.4 oz (81.4 kg)   SpO2 96%   BMI 28.96 kg/m     Wt Readings from Last 3 Encounters:  07/14/22 179 lb 6.4 oz (81.4 kg)  10/04/21 185 lb (83.9 kg)  08/30/21 200 lb 6.4 oz (90.9 kg)     GEN:  Elderly female, NAD HEENT: Normal NECK: No JVD; No carotid bruits CARDIAC: Irregularly Irregular rhythm, no murmurs, rubs, gallops RESPIRATORY:  CTAB ABDOMEN: Soft, non-tender, non-distended MUSCULOSKELETAL: Trace-1+ ankle edema SKIN: Warm and dry NEUROLOGIC:  Alert and oriented x 3 PSYCHIATRIC:  Normal  affect   ASSESSMENT:    1. Chronic diastolic heart failure ([place])   2. Permanent atrial fibrillation ([place])   3. Essential hypertension   4. Chronic anticoagulation   5. Persistent atrial fibrillation ([place])   6. Lower extremity edema   7. Coronary artery disease involving native coronary artery of native heart without angina pectoris     PLAN:    In order of problems listed above:  #Chronic Diastolic HF: Last TTE 8469 with LVEF 55-60%. Was having worsening LE edema in 08/2021 found to have pro-BNP 1000. Was started on lasix '20mg'$  BID with improvement which was lowered to '20mg'$  daily. Currently, doing well with trace ankle edema. -Continue lasix '20mg'$  daily with extra dose as needed -Low Na diet  #Permanent Afib: CHADs-vasc 5. On eliquis without bleeding issues. Pursuing rate control strategy. -Continue apixaban '5mg'$  BID -Contnue metop '100mg'$  BID; can take extra '50mg'$  if palpitations  #CAD: On medical management. No history of PCI. Was previously on plavix but was stopped due to need for [facility] -Continue metop '100mg'$  BID  #Mild carotid disease: Right carotid artery 1-39% 12/2020.  -No further imaging needed  #HTN: Generally well controlled at home.  -Continue amlodipine 2.'5mg'$  daily -Continue triamerene-HCTZ 37.'5mg'$ -'25mg'$  daily   Medication Adjustments/Labs and Tests Ordered: Current medicines are reviewed at length with the patient today.  Concerns regarding medicines are outlined  above.  Orders Placed This Encounter  Procedures   EKG 12-Lead   No orders of the defined types were placed in this encounter.   Patient Instructions  Medication Instructions:   Your physician recommends that you continue on your current medications as directed. Please refer to the Current Medication list given to you today.  *If you need a refill on your cardiac medications before your next appointment, please call your pharmacy*   Follow-Up: At [facility], you and your health needs are our priority.  As part of our continuing mission to provide you with exceptional heart care, we have created designated Provider Care Teams.  These Care Teams include your primary Cardiologist (physician) and Advanced Practice Providers (APPs -  Physician Assistants and Nurse Practitioners) who all work together to provide you with the care you need, when you need it.  We recommend signing up for the patient portal called "MyChart".  Sign up information is provided on this After Visit Summary.  MyChart is used to connect with patients for Virtual Visits (Telemedicine).  Patients are able to view lab/test results, encounter notes, upcoming appointments, etc.  Non-urgent messages can be sent to your provider as well.   To learn more about what you can do with MyChart, go to [email or web address].    Your next appointment:   6 month(s)  The format for your next appointment:   In Person  Provider:   Freada Bergeron, MD {    Important Information About Sugar            I,Stephanie Williams,acting as a scribe for Tanya Bergeron, MD.,have documented all relevant documentation on the behalf of Tanya Bergeron, MD,as directed by  Tanya Bergeron, MD while in the presence of Tanya Bergeron, MD.   I, Tanya Bergeron, MD, have reviewed all documentation for this visit. The documentation on 07/14/22 for the exam, diagnosis, procedures, and orders are all accurate and  complete.   Signed, Tanya Bergeron, MD  07/14/2022 11:36 AM    [place]

## 2022-07-14 ENCOUNTER — Encounter: Payer: Self-pay | Admitting: Cardiology

## 2022-07-14 ENCOUNTER — Ambulatory Visit (INDEPENDENT_AMBULATORY_CARE_PROVIDER_SITE_OTHER): Payer: Medicare Other | Admitting: Cardiology

## 2022-07-14 VITALS — BP 144/90 | HR 67 | Ht 66.0 in | Wt 179.4 lb

## 2022-07-14 DIAGNOSIS — I5032 Chronic diastolic (congestive) heart failure: Secondary | ICD-10-CM

## 2022-07-14 DIAGNOSIS — R600 Localized edema: Secondary | ICD-10-CM

## 2022-07-14 DIAGNOSIS — I10 Essential (primary) hypertension: Secondary | ICD-10-CM

## 2022-07-14 DIAGNOSIS — I4821 Permanent atrial fibrillation: Secondary | ICD-10-CM | POA: Diagnosis not present

## 2022-07-14 DIAGNOSIS — I2510 Atherosclerotic heart disease of native coronary artery without angina pectoris: Secondary | ICD-10-CM | POA: Diagnosis not present

## 2022-07-14 DIAGNOSIS — Z7901 Long term (current) use of anticoagulants: Secondary | ICD-10-CM | POA: Diagnosis not present

## 2022-07-14 DIAGNOSIS — I4819 Other persistent atrial fibrillation: Secondary | ICD-10-CM | POA: Diagnosis not present

## 2022-07-14 NOTE — Patient Instructions (Signed)
Medication Instructions:   Your physician recommends that you continue on your current medications as directed. Please refer to the Current Medication list given to you today.  *If you need a refill on your cardiac medications before your next appointment, please call your pharmacy*   Follow-Up: At [facility], you and your health needs are our priority.  As part of our continuing mission to provide you with exceptional heart care, we have created designated Provider Care Teams.  These Care Teams include your primary Cardiologist (physician) and Advanced Practice Providers (APPs -  Physician Assistants and Nurse Practitioners) who all work together to provide you with the care you need, when you need it.  We recommend signing up for the patient portal called "MyChart".  Sign up information is provided on this After Visit Summary.  MyChart is used to connect with patients for Virtual Visits (Telemedicine).  Patients are able to view lab/test results, encounter notes, upcoming appointments, etc.  Non-urgent messages can be sent to your provider as well.   To learn more about what you can do with MyChart, go to [email or web address].    Your next appointment:   6 month(s)  The format for your next appointment:   In Person  Provider:   Freada Bergeron, MD {    Important Information About Sugar

## 2022-07-17 ENCOUNTER — Other Ambulatory Visit: Payer: Self-pay | Admitting: *Deleted

## 2022-07-17 DIAGNOSIS — I4891 Unspecified atrial fibrillation: Secondary | ICD-10-CM

## 2022-07-17 MED ORDER — APIXABAN 5 MG PO TABS: 5.0000 mg | ORAL_TABLET | Freq: Two times a day (BID) | ORAL | 1 refills | Status: DC

## 2022-07-17 NOTE — Telephone Encounter (Signed)
Eliquis '5mg'$  refill request received. Patient is 88 years old, weight-81.4kg, Crea-1.35 on 10/04/2021,  Diagnosis-Afib, and last seen by Dr. Johney Frame on 07/14/2022. Dose is appropriate based on dosing criteria. Will send in refill to requested pharmacy.

## 2022-09-20 ENCOUNTER — Ambulatory Visit: Payer: Medicare Other | Admitting: Cardiology

## 2022-10-24 ENCOUNTER — Other Ambulatory Visit (HOSPITAL_BASED_OUTPATIENT_CLINIC_OR_DEPARTMENT_OTHER): Payer: Self-pay | Admitting: Family

## 2022-10-24 NOTE — Telephone Encounter (Signed)
Pt of Dr. Johney Frame. Please review for refill. Thank you!

## 2022-11-16 DIAGNOSIS — R5383 Other fatigue: Secondary | ICD-10-CM | POA: Diagnosis not present

## 2022-11-16 DIAGNOSIS — E559 Vitamin D deficiency, unspecified: Secondary | ICD-10-CM | POA: Diagnosis not present

## 2022-11-16 DIAGNOSIS — E1122 Type 2 diabetes mellitus with diabetic chronic kidney disease: Secondary | ICD-10-CM | POA: Diagnosis not present

## 2022-11-16 DIAGNOSIS — E785 Hyperlipidemia, unspecified: Secondary | ICD-10-CM | POA: Diagnosis not present

## 2022-11-23 DIAGNOSIS — D692 Other nonthrombocytopenic purpura: Secondary | ICD-10-CM | POA: Diagnosis not present

## 2022-11-23 DIAGNOSIS — I1310 Hypertensive heart and chronic kidney disease without heart failure, with stage 1 through stage 4 chronic kidney disease, or unspecified chronic kidney disease: Secondary | ICD-10-CM | POA: Diagnosis not present

## 2022-11-23 DIAGNOSIS — I2510 Atherosclerotic heart disease of native coronary artery without angina pectoris: Secondary | ICD-10-CM | POA: Diagnosis not present

## 2022-11-23 DIAGNOSIS — M810 Age-related osteoporosis without current pathological fracture: Secondary | ICD-10-CM | POA: Diagnosis not present

## 2022-11-23 DIAGNOSIS — F419 Anxiety disorder, unspecified: Secondary | ICD-10-CM | POA: Diagnosis not present

## 2022-11-23 DIAGNOSIS — I480 Paroxysmal atrial fibrillation: Secondary | ICD-10-CM | POA: Diagnosis not present

## 2022-11-23 DIAGNOSIS — E1122 Type 2 diabetes mellitus with diabetic chronic kidney disease: Secondary | ICD-10-CM | POA: Diagnosis not present

## 2022-11-23 DIAGNOSIS — Z0000 Encounter for general adult medical examination without abnormal findings: Secondary | ICD-10-CM | POA: Diagnosis not present

## 2022-11-23 DIAGNOSIS — Z1331 Encounter for screening for depression: Secondary | ICD-10-CM | POA: Diagnosis not present

## 2022-11-23 DIAGNOSIS — R600 Localized edema: Secondary | ICD-10-CM | POA: Diagnosis not present

## 2022-11-23 DIAGNOSIS — E785 Hyperlipidemia, unspecified: Secondary | ICD-10-CM | POA: Diagnosis not present

## 2022-11-23 DIAGNOSIS — Z9181 History of falling: Secondary | ICD-10-CM | POA: Diagnosis not present

## 2022-11-23 DIAGNOSIS — N1831 Chronic kidney disease, stage 3a: Secondary | ICD-10-CM | POA: Diagnosis not present

## 2022-11-23 DIAGNOSIS — Z23 Encounter for immunization: Secondary | ICD-10-CM | POA: Diagnosis not present

## 2022-12-19 ENCOUNTER — Telehealth: Payer: Self-pay | Admitting: *Deleted

## 2022-12-19 DIAGNOSIS — I4891 Unspecified atrial fibrillation: Secondary | ICD-10-CM

## 2022-12-19 MED ORDER — APIXABAN 5 MG PO TABS: 5.0000 mg | ORAL_TABLET | Freq: Two times a day (BID) | ORAL | 1 refills | Status: AC

## 2022-12-19 NOTE — Telephone Encounter (Signed)
Prescription refill request for Eliquis received.  Indication: afib  Last office visit: Johney Frame, 07/14/2022 Scr: 1.35, 10/04/2021 Age: 89  Weight:  81.4 kg   Pt is overdue for blood work. Called pt's PCP to see if they had a copy of updated blood work and if so to fax results to [phone]  Pt would like refill sent to [facility] 90 day supply.

## 2022-12-19 NOTE — Telephone Encounter (Signed)
Labs received.  Scr 1.0 on 11/16/22.   Refill sent to requested pharmacy.

## 2023-01-29 NOTE — Progress Notes (Unsigned)
Cardiology Office Note:    Date:  02/01/2023   ID:  Tanya Ho, DOB 1926-02-08, MRN [id number]  PCP:  Shon Baton, MD   [facility] Providers Cardiologist:  Freada Bergeron, MD Electrophysiologist:  Will Meredith Leeds, MD      Referring MD: Shon Baton, MD    History of Present Illness:    Tanya Ho is a 89 y.o. female with a hx of CAD, afib on eliquis HTN, HLD, and GERD who was previously followed by Truitt Merle who now returns to clinic for follow-up.  Previous patient of Truitt Merle, NP who has since established with me. Most recent echo 2019 with normal LVEF, mild to moderate TR. Carotid duplex 12/2020 with bilateral 1-39% stenosis.   Was last seen on 06/2022. Had one episode of chest tightness that correlated with elevated blood pressure during brief episode of Afib. Otherwise was doing well.   Today, the patient overall feels well. Has occasional episodes of Afib with occasional discomfort at that time. Continues to have episodes where her heart races that wakes her up in the middle of the night which makes her feel very anxious. She takes her BP at that time and will run as high as 190/100. Outside of these episodes and she is at her baseline, she states her BP is well controlled.    Past Medical History:  Diagnosis Date   Back pain    Bruises easily    Coronary artery disease    NONOBSTRUCTIVE   Fatigue    Hyperlipidemia    Hypertension    Knee pain    OA (osteoarthritis)    Obesity    Palpitations    SOB (shortness of breath)     Past Surgical History:  Procedure Laterality Date   BREAST BIOPSY     LEFT BREAST   CARDIAC CATHETERIZATION  05/11/2006   OVERALL CARDIAC SIZE AND SILHOUETTE ARE NORMAL. EF ESTIMATED 60%   KIDNEY STONES  1982   KNEE ARTHROSCOPY  09/22/2008   VAGINAL HYSTERECTOMY      Current Medications: Current Meds  Medication Sig   ALPRAZolam (XANAX) 1 MG tablet Take 1 mg by mouth at bedtime as needed.   amLODipine (NORVASC)  2.5 MG tablet 2.5 mg daily.   apixaban (ELIQUIS) 5 MG TABS tablet Take 1 tablet (5 mg total) by mouth 2 (two) times daily.   ergocalciferol (VITAMIN D2) 50000 UNITS capsule Take 50,000 Units by mouth once a week.   famotidine (PEPCID) 20 MG tablet 1 tab Orally twice daily   furosemide (LASIX) 20 MG tablet TAKE 1 TABLET EVERY DAY   nitroGLYCERIN (NITROSTAT) 0.4 MG SL tablet Place 1 tablet (0.4 mg total) under the tongue every 5 (five) minutes as needed for chest pain.   traMADol (ULTRAM) 50 MG tablet TAKE 1-2 TABLETS BY MOUTH EVERY 6-8 HOURS AS NEEDED FOR PAIN   [DISCONTINUED] metoprolol (LOPRESSOR) 100 MG tablet Take 1 tablet (100 mg total) by mouth 2 (two) times daily.   [DISCONTINUED] metoprolol tartrate (LOPRESSOR) 100 MG tablet Take 1 tablet (100 mg total) by mouth 2 (two) times daily. You may take an additional 1/2 tablet (50 mg total) by mouth daily as needed for palpitations.     Allergies:   Codeine, Demerol, and Morphine and related   Social History   Socioeconomic History   Marital status: Widowed    Spouse name: Not on file   Number of children: Not on file   Years of education: Not on  file   Highest education level: Not on file  Occupational History   Not on file  Tobacco Use   Smoking status: Never   Smokeless tobacco: Never  Vaping Use   Vaping Use: Never used  Substance and Sexual Activity   Alcohol use: No   Drug use: No   Sexual activity: Not Currently  Other Topics Concern   Not on file  Social History Narrative   Not on file   Social Determinants of Health   Financial Resource Strain: Not on file  Food Insecurity: Not on file  Transportation Needs: Not on file  Physical Activity: Not on file  Stress: Not on file  Social Connections: Not on file     Family History: The patient's family history includes Heart attack in her father; Heart disease in her mother; Stroke in her mother.  Review of Systems  Constitutional:  Positive for malaise/fatigue.  Negative for chills and fever.  HENT:  Negative for congestion.   Respiratory:  Negative for shortness of breath.   Cardiovascular:  Positive for chest pain, palpitations and leg swelling (mild ). Negative for orthopnea and PND.  Gastrointestinal:  Negative for blood in stool, constipation, nausea and vomiting.  Genitourinary:  Negative for dysuria and hematuria.  Skin:  Negative for rash.  Neurological:  Negative for dizziness and headaches.  Psychiatric/Behavioral:  The patient is nervous/anxious.       EKGs/Labs/Other Studies Reviewed:    The following studies were reviewed today: Echo Study Conclusions 02/2018 - Left ventricle: The cavity size was normal. Wall thickness was   normal. Systolic function was normal. The estimated ejection   fraction was in the range of 55% to 60%. - Mitral valve: Calcified annulus. - Right atrium: The atrium was mildly dilated. - Tricuspid valve: There was mild-moderate regurgitation. - Pulmonary arteries: PA peak pressure: 37 mm Hg (S).  EKG:  07/14/22: Afib with HR 53  Recent Labs: No results found for requested labs within last 365 days.  Recent Lipid Panel    Component Value Date/Time   CHOL 187 03/04/2018 1523   TRIG 267 (H) 03/04/2018 1523   HDL 44 03/04/2018 1523   CHOLHDL 4.3 03/04/2018 1523   LDLCALC 90 03/04/2018 1523     Risk Assessment/Calculations:    CHA2DS2-VASc Score =    {This indicates a  % annual risk of stroke. The patient's score is based upon:      Physical Exam:    VS:  BP (!) 170/92   Pulse 64   Ht 5' 6"$  (1.676 m)   Wt 176 lb 6.4 oz (80 kg)   SpO2 95%   BMI 28.47 kg/m     Wt Readings from Last 3 Encounters:  02/01/23 176 lb 6.4 oz (80 kg)  07/14/22 179 lb 6.4 oz (81.4 kg)  10/04/21 185 lb (83.9 kg)     GEN:  Elderly female, NAD HEENT: Normal NECK: No JVD; No carotid bruits CARDIAC: Irregularly Irregular rhythm, no murmurs, rubs, gallops RESPIRATORY:  CTAB ABDOMEN: Soft, non-tender,  non-distended MUSCULOSKELETAL: Trace-1+ ankle edema SKIN: Warm and dry NEUROLOGIC:  Alert and oriented x 3 PSYCHIATRIC:  Normal affect   ASSESSMENT:    1. Chronic diastolic heart failure ([place])   2. Chronic anticoagulation   3. Permanent atrial fibrillation ([place])   4. Essential hypertension   5. Hypercoagulable state ([place])      PLAN:    In order of problems listed above:  #Chronic Diastolic HF: Last TTE [id number] with LVEF  55-60%. Was having worsening LE edema in 08/2021 found to have pro-BNP 1000. Was started on lasix 3m BID with improvement which was lowered to 268mdaily. Currently, doing well with trace ankle edema. -Continue lasix 2034maily with extra dose as needed -Low Na diet  #Permanent Afib: CHADs-vasc 5. On eliquis without bleeding issues. Pursuing rate control strategy. -Continue apixaban 5mg79mD -Contnue metop 100mg26m; can take extra 50mg 42malpitations  #CAD: On medical management. No history of PCI. Was previously on plavix but was [facility] B18[facility]#Mild carotid disease: Right carotid artery 1-39% 12/2020.  -No further imaging needed  #HTN: Generally well controlled at home. Elevated in MD office. She will send BP log for us to [place]. -Continue amlodipine 2.5mg dai71m-Continue triamerene-HCTZ 37.5mg-25mg29mily -Will send BP log for us to [place]   Medication Adjustments/Labs and Tests Ordered: Current medicines are reviewed at length with the patient today.  Concerns regarding medicines are outlined above.  No orders of the defined types were placed in this encounter.  Meds ordered this encounter  Medications   DISCONTD: metoprolol tartrate (LOPRESSOR) 100 MG tablet    Sig: Take 1 tablet (100 mg total) by mouth 2 (two) times daily. You may take an additional 1/2 tablet (50 mg total) by mouth daily as needed for palpitations.    Dispense:  225 tablet    Refill:  2    Dose increase   metoprolol tartrate  (LOPRESSOR) 100 MG tablet    Sig: Take 1 tablet (100 mg total) by mouth 2 (two) times daily. You may take an additional 1/2 tablet (50 mg total) by mouth daily as needed for palpitations.    Dispense:  225 tablet    Refill:  2    Dose increase    Patient Instructions  Medication Instructions:   TAKE METOPROLOL TARTRATE (LOPRESSOR) 100 MG BY MOUTH TWICE DAILY. YOU MAY TAKE AN ADDITIONAL 1/2 TABLET (50 MG TOTAL) BY MOUTH DAILY AS NEEDED FOR PALPITATIONS  *If you need a refill on your cardiac medications before your next appointment, please call your pharmacy*    Follow-Up: At [facility] your health needs are our priority.  As part of our continuing mission to provide you with exceptional heart care, we have created designated Provider Care Teams.  These Care Teams include your primary Cardiologist (physician) and Advanced Practice Providers (APPs -  Physician Assistants and Nurse Practitioners) who all work together to provide you with the care you need, when you need it.  We recommend signing up for the patient portal called "MyChart".  Sign up information is provided on this After Visit Summary.  MyChart is used to connect with patients for Virtual Visits (Telemedicine).  Patients are able to view lab/test results, encounter notes, upcoming appointments, etc.  Non-urgent messages can be sent to your provider as well.   To learn more about what you can do with MyChart, go to [email or web address] next appointment:   6 month(s)  Provider:   Malala Trenkamp EFreada Bergeron      Signed, Nyeshia Mysliwiec EFreada Bergeron5/2024 12:19 PM    [facility]

## 2023-01-31 ENCOUNTER — Ambulatory Visit: Payer: Medicare Other | Admitting: Cardiology

## 2023-02-01 ENCOUNTER — Ambulatory Visit: Payer: Medicare Other | Attending: Cardiology | Admitting: Cardiology

## 2023-02-01 ENCOUNTER — Encounter: Payer: Self-pay | Admitting: Cardiology

## 2023-02-01 VITALS — BP 170/92 | HR 64 | Ht 66.0 in | Wt 176.4 lb

## 2023-02-01 DIAGNOSIS — D6859 Other primary thrombophilia: Secondary | ICD-10-CM

## 2023-02-01 DIAGNOSIS — I5032 Chronic diastolic (congestive) heart failure: Secondary | ICD-10-CM | POA: Diagnosis not present

## 2023-02-01 DIAGNOSIS — I4821 Permanent atrial fibrillation: Secondary | ICD-10-CM | POA: Diagnosis not present

## 2023-02-01 DIAGNOSIS — I10 Essential (primary) hypertension: Secondary | ICD-10-CM

## 2023-02-01 DIAGNOSIS — Z7901 Long term (current) use of anticoagulants: Secondary | ICD-10-CM | POA: Diagnosis not present

## 2023-02-01 MED ORDER — METOPROLOL TARTRATE 100 MG PO TABS: 100.0000 mg | ORAL_TABLET | Freq: Two times a day (BID) | ORAL | 2 refills | Status: DC

## 2023-02-01 NOTE — Patient Instructions (Signed)
Medication Instructions:   TAKE METOPROLOL TARTRATE (LOPRESSOR) 100 MG BY MOUTH TWICE DAILY. YOU MAY TAKE AN ADDITIONAL 1/2 TABLET (50 MG TOTAL) BY MOUTH DAILY AS NEEDED FOR PALPITATIONS  *If you need a refill on your cardiac medications before your next appointment, please call your pharmacy*    Follow-Up: At [facility], you and your health needs are our priority.  As part of our continuing mission to provide you with exceptional heart care, we have created designated Provider Care Teams.  These Care Teams include your primary Cardiologist (physician) and Advanced Practice Providers (APPs -  Physician Assistants and Nurse Practitioners) who all work together to provide you with the care you need, when you need it.  We recommend signing up for the patient portal called "MyChart".  Sign up information is provided on this After Visit Summary.  MyChart is used to connect with patients for Virtual Visits (Telemedicine).  Patients are able to view lab/test results, encounter notes, upcoming appointments, etc.  Non-urgent messages can be sent to your provider as well.   To learn more about what you can do with MyChart, go to [email or web address].    Your next appointment:   6 month(s)  Provider:   Freada Bergeron, MD

## 2023-03-11 ENCOUNTER — Inpatient Hospital Stay (HOSPITAL_COMMUNITY)
Admission: EM | Admit: 2023-03-11 | Discharge: 2023-03-15 | DRG: 481 | Disposition: A | Discharge status: Skilled Nursing Facility | Payer: Medicare Other | Attending: Family Medicine | Admitting: Family Medicine

## 2023-03-11 ENCOUNTER — Other Ambulatory Visit: Payer: Self-pay

## 2023-03-11 DIAGNOSIS — R339 Retention of urine, unspecified: Secondary | ICD-10-CM | POA: Diagnosis not present

## 2023-03-11 DIAGNOSIS — Z90710 Acquired absence of both cervix and uterus: Secondary | ICD-10-CM | POA: Diagnosis not present

## 2023-03-11 DIAGNOSIS — I130 Hypertensive heart and chronic kidney disease with heart failure and stage 1 through stage 4 chronic kidney disease, or unspecified chronic kidney disease: Secondary | ICD-10-CM | POA: Diagnosis present

## 2023-03-11 DIAGNOSIS — M1990 Unspecified osteoarthritis, unspecified site: Secondary | ICD-10-CM | POA: Diagnosis present

## 2023-03-11 DIAGNOSIS — I4891 Unspecified atrial fibrillation: Secondary | ICD-10-CM | POA: Diagnosis not present

## 2023-03-11 DIAGNOSIS — I5032 Chronic diastolic (congestive) heart failure: Secondary | ICD-10-CM | POA: Diagnosis present

## 2023-03-11 DIAGNOSIS — I160 Hypertensive urgency: Secondary | ICD-10-CM | POA: Diagnosis present

## 2023-03-11 DIAGNOSIS — M25552 Pain in left hip: Secondary | ICD-10-CM | POA: Diagnosis not present

## 2023-03-11 DIAGNOSIS — Z87440 Personal history of urinary (tract) infections: Secondary | ICD-10-CM

## 2023-03-11 DIAGNOSIS — I10 Essential (primary) hypertension: Secondary | ICD-10-CM | POA: Diagnosis not present

## 2023-03-11 DIAGNOSIS — S72142A Displaced intertrochanteric fracture of left femur, initial encounter for closed fracture: Secondary | ICD-10-CM | POA: Diagnosis present

## 2023-03-11 DIAGNOSIS — N1831 Chronic kidney disease, stage 3a: Secondary | ICD-10-CM | POA: Diagnosis present

## 2023-03-11 DIAGNOSIS — S72002A Fracture of unspecified part of neck of left femur, initial encounter for closed fracture: Secondary | ICD-10-CM | POA: Diagnosis not present

## 2023-03-11 DIAGNOSIS — W010XXA Fall on same level from slipping, tripping and stumbling without subsequent striking against object, initial encounter: Secondary | ICD-10-CM | POA: Diagnosis present

## 2023-03-11 DIAGNOSIS — Y9301 Activity, walking, marching and hiking: Secondary | ICD-10-CM | POA: Diagnosis present

## 2023-03-11 DIAGNOSIS — Z7901 Long term (current) use of anticoagulants: Secondary | ICD-10-CM

## 2023-03-11 DIAGNOSIS — R739 Hyperglycemia, unspecified: Secondary | ICD-10-CM | POA: Diagnosis present

## 2023-03-11 DIAGNOSIS — E1122 Type 2 diabetes mellitus with diabetic chronic kidney disease: Secondary | ICD-10-CM | POA: Diagnosis present

## 2023-03-11 DIAGNOSIS — Z885 Allergy status to narcotic agent status: Secondary | ICD-10-CM | POA: Diagnosis not present

## 2023-03-11 DIAGNOSIS — E876 Hypokalemia: Secondary | ICD-10-CM | POA: Diagnosis present

## 2023-03-11 DIAGNOSIS — Z87442 Personal history of urinary calculi: Secondary | ICD-10-CM

## 2023-03-11 DIAGNOSIS — Y92009 Unspecified place in unspecified non-institutional (private) residence as the place of occurrence of the external cause: Secondary | ICD-10-CM | POA: Diagnosis not present

## 2023-03-11 DIAGNOSIS — E1165 Type 2 diabetes mellitus with hyperglycemia: Secondary | ICD-10-CM | POA: Diagnosis present

## 2023-03-11 DIAGNOSIS — I2510 Atherosclerotic heart disease of native coronary artery without angina pectoris: Secondary | ICD-10-CM | POA: Diagnosis present

## 2023-03-11 DIAGNOSIS — S72002D Fracture of unspecified part of neck of left femur, subsequent encounter for closed fracture with routine healing: Secondary | ICD-10-CM | POA: Diagnosis not present

## 2023-03-11 DIAGNOSIS — I4821 Permanent atrial fibrillation: Secondary | ICD-10-CM | POA: Diagnosis present

## 2023-03-11 DIAGNOSIS — R519 Headache, unspecified: Secondary | ICD-10-CM | POA: Diagnosis not present

## 2023-03-11 DIAGNOSIS — Z466 Encounter for fitting and adjustment of urinary device: Secondary | ICD-10-CM | POA: Diagnosis not present

## 2023-03-11 DIAGNOSIS — Z9181 History of falling: Secondary | ICD-10-CM | POA: Diagnosis not present

## 2023-03-11 DIAGNOSIS — R300 Dysuria: Secondary | ICD-10-CM | POA: Diagnosis not present

## 2023-03-11 DIAGNOSIS — Z79899 Other long term (current) drug therapy: Secondary | ICD-10-CM | POA: Diagnosis not present

## 2023-03-11 DIAGNOSIS — R338 Other retention of urine: Secondary | ICD-10-CM | POA: Insufficient documentation

## 2023-03-11 DIAGNOSIS — Z8249 Family history of ischemic heart disease and other diseases of the circulatory system: Secondary | ICD-10-CM | POA: Diagnosis not present

## 2023-03-11 DIAGNOSIS — D62 Acute posthemorrhagic anemia: Secondary | ICD-10-CM | POA: Diagnosis not present

## 2023-03-11 DIAGNOSIS — E785 Hyperlipidemia, unspecified: Secondary | ICD-10-CM | POA: Diagnosis present

## 2023-03-11 DIAGNOSIS — Z7401 Bed confinement status: Secondary | ICD-10-CM | POA: Diagnosis not present

## 2023-03-11 DIAGNOSIS — R0989 Other specified symptoms and signs involving the circulatory and respiratory systems: Secondary | ICD-10-CM | POA: Diagnosis not present

## 2023-03-11 DIAGNOSIS — R531 Weakness: Secondary | ICD-10-CM | POA: Diagnosis not present

## 2023-03-11 MED ORDER — FENTANYL CITRATE PF 50 MCG/ML IJ SOSY
50.0000 ug | PREFILLED_SYRINGE | Freq: Once | INTRAMUSCULAR | Status: AC
  Administered 2023-03-12: 50 ug via INTRAVENOUS
  Filled 2023-03-11: qty 1

## 2023-03-11 NOTE — ED Triage Notes (Signed)
Patient arrives via ems from home secondary  to a fall. Patient states she was walking to the restroom and tripped and fell. No head strike, patient c/o lt hip pain, patient takes eliquis. No rotation noted, c collar placed.

## 2023-03-12 ENCOUNTER — Inpatient Hospital Stay (HOSPITAL_COMMUNITY): Payer: Medicare Other | Admitting: Certified Registered Nurse Anesthetist

## 2023-03-12 ENCOUNTER — Encounter (HOSPITAL_COMMUNITY)
Admission: EM | Disposition: A | Discharge status: Skilled Nursing Facility | Payer: Self-pay | Source: Home / Self Care | Attending: Internal Medicine

## 2023-03-12 ENCOUNTER — Emergency Department (HOSPITAL_COMMUNITY): Payer: Medicare Other

## 2023-03-12 ENCOUNTER — Inpatient Hospital Stay (HOSPITAL_COMMUNITY): Payer: Medicare Other

## 2023-03-12 ENCOUNTER — Other Ambulatory Visit: Payer: Self-pay

## 2023-03-12 ENCOUNTER — Encounter (HOSPITAL_COMMUNITY): Payer: Self-pay

## 2023-03-12 DIAGNOSIS — I5032 Chronic diastolic (congestive) heart failure: Secondary | ICD-10-CM | POA: Diagnosis present

## 2023-03-12 DIAGNOSIS — S72002A Fracture of unspecified part of neck of left femur, initial encounter for closed fracture: Secondary | ICD-10-CM | POA: Diagnosis not present

## 2023-03-12 DIAGNOSIS — I4891 Unspecified atrial fibrillation: Secondary | ICD-10-CM | POA: Diagnosis not present

## 2023-03-12 DIAGNOSIS — I4821 Permanent atrial fibrillation: Secondary | ICD-10-CM | POA: Diagnosis present

## 2023-03-12 DIAGNOSIS — R0989 Other specified symptoms and signs involving the circulatory and respiratory systems: Secondary | ICD-10-CM | POA: Diagnosis not present

## 2023-03-12 DIAGNOSIS — I10 Essential (primary) hypertension: Secondary | ICD-10-CM | POA: Diagnosis not present

## 2023-03-12 DIAGNOSIS — N1831 Chronic kidney disease, stage 3a: Secondary | ICD-10-CM | POA: Diagnosis present

## 2023-03-12 DIAGNOSIS — Z90710 Acquired absence of both cervix and uterus: Secondary | ICD-10-CM | POA: Diagnosis not present

## 2023-03-12 DIAGNOSIS — R531 Weakness: Secondary | ICD-10-CM | POA: Diagnosis not present

## 2023-03-12 DIAGNOSIS — R739 Hyperglycemia, unspecified: Secondary | ICD-10-CM | POA: Diagnosis present

## 2023-03-12 DIAGNOSIS — R519 Headache, unspecified: Secondary | ICD-10-CM | POA: Diagnosis not present

## 2023-03-12 DIAGNOSIS — I130 Hypertensive heart and chronic kidney disease with heart failure and stage 1 through stage 4 chronic kidney disease, or unspecified chronic kidney disease: Secondary | ICD-10-CM | POA: Diagnosis present

## 2023-03-12 DIAGNOSIS — I2510 Atherosclerotic heart disease of native coronary artery without angina pectoris: Secondary | ICD-10-CM

## 2023-03-12 DIAGNOSIS — Z885 Allergy status to narcotic agent status: Secondary | ICD-10-CM | POA: Diagnosis not present

## 2023-03-12 DIAGNOSIS — I160 Hypertensive urgency: Secondary | ICD-10-CM | POA: Diagnosis present

## 2023-03-12 DIAGNOSIS — M1990 Unspecified osteoarthritis, unspecified site: Secondary | ICD-10-CM | POA: Diagnosis present

## 2023-03-12 DIAGNOSIS — Z7901 Long term (current) use of anticoagulants: Secondary | ICD-10-CM | POA: Diagnosis not present

## 2023-03-12 DIAGNOSIS — Y92009 Unspecified place in unspecified non-institutional (private) residence as the place of occurrence of the external cause: Secondary | ICD-10-CM | POA: Diagnosis not present

## 2023-03-12 DIAGNOSIS — Z466 Encounter for fitting and adjustment of urinary device: Secondary | ICD-10-CM | POA: Diagnosis not present

## 2023-03-12 DIAGNOSIS — D62 Acute posthemorrhagic anemia: Secondary | ICD-10-CM | POA: Diagnosis not present

## 2023-03-12 DIAGNOSIS — Z87442 Personal history of urinary calculi: Secondary | ICD-10-CM | POA: Diagnosis not present

## 2023-03-12 DIAGNOSIS — R339 Retention of urine, unspecified: Secondary | ICD-10-CM | POA: Diagnosis not present

## 2023-03-12 DIAGNOSIS — E1122 Type 2 diabetes mellitus with diabetic chronic kidney disease: Secondary | ICD-10-CM | POA: Diagnosis present

## 2023-03-12 DIAGNOSIS — Z8249 Family history of ischemic heart disease and other diseases of the circulatory system: Secondary | ICD-10-CM | POA: Diagnosis not present

## 2023-03-12 DIAGNOSIS — S72002D Fracture of unspecified part of neck of left femur, subsequent encounter for closed fracture with routine healing: Secondary | ICD-10-CM | POA: Diagnosis not present

## 2023-03-12 DIAGNOSIS — S72142A Displaced intertrochanteric fracture of left femur, initial encounter for closed fracture: Secondary | ICD-10-CM

## 2023-03-12 DIAGNOSIS — Y9301 Activity, walking, marching and hiking: Secondary | ICD-10-CM | POA: Diagnosis present

## 2023-03-12 DIAGNOSIS — Z87440 Personal history of urinary (tract) infections: Secondary | ICD-10-CM | POA: Diagnosis not present

## 2023-03-12 DIAGNOSIS — E876 Hypokalemia: Secondary | ICD-10-CM | POA: Diagnosis present

## 2023-03-12 DIAGNOSIS — Z9181 History of falling: Secondary | ICD-10-CM | POA: Diagnosis not present

## 2023-03-12 DIAGNOSIS — M25552 Pain in left hip: Secondary | ICD-10-CM | POA: Diagnosis present

## 2023-03-12 DIAGNOSIS — Z7401 Bed confinement status: Secondary | ICD-10-CM | POA: Diagnosis not present

## 2023-03-12 DIAGNOSIS — Z79899 Other long term (current) drug therapy: Secondary | ICD-10-CM | POA: Diagnosis not present

## 2023-03-12 DIAGNOSIS — W010XXA Fall on same level from slipping, tripping and stumbling without subsequent striking against object, initial encounter: Secondary | ICD-10-CM | POA: Diagnosis present

## 2023-03-12 DIAGNOSIS — R300 Dysuria: Secondary | ICD-10-CM | POA: Diagnosis not present

## 2023-03-12 DIAGNOSIS — E785 Hyperlipidemia, unspecified: Secondary | ICD-10-CM | POA: Diagnosis present

## 2023-03-12 DIAGNOSIS — E1165 Type 2 diabetes mellitus with hyperglycemia: Secondary | ICD-10-CM | POA: Diagnosis present

## 2023-03-12 HISTORY — PX: INTRAMEDULLARY (IM) NAIL INTERTROCHANTERIC: SHX5875

## 2023-03-12 LAB — CBG MONITORING, ED
Glucose-Capillary: 201 mg/dL — ABNORMAL HIGH (ref 70–99)
Glucose-Capillary: 186 mg/dL — ABNORMAL HIGH (ref 70–99)

## 2023-03-12 LAB — CBC WITH DIFFERENTIAL/PLATELET
Abs Immature Granulocytes: 0.17 10*3/uL — ABNORMAL HIGH (ref 0.00–0.07)
Basophils Absolute: 0.1 10*3/uL (ref 0.0–0.1)
Basophils Relative: 1 %
Eosinophils Absolute: 0.1 10*3/uL (ref 0.0–0.5)
Eosinophils Relative: 1 %
HCT: 39.7 % (ref 36.0–46.0)
Hemoglobin: 13.3 g/dL (ref 12.0–15.0)
Immature Granulocytes: 1 %
Lymphocytes Relative: 15 %
Lymphs Abs: 2.2 10*3/uL (ref 0.7–4.0)
MCH: 31.2 pg (ref 26.0–34.0)
MCHC: 33.5 g/dL (ref 30.0–36.0)
MCV: 93.2 fL (ref 80.0–100.0)
Monocytes Absolute: 0.8 10*3/uL (ref 0.1–1.0)
Monocytes Relative: 6 %
Neutro Abs: 11.5 10*3/uL — ABNORMAL HIGH (ref 1.7–7.7)
Neutrophils Relative %: 76 %
Platelets: 235 10*3/uL (ref 150–400)
RBC: 4.26 MIL/uL (ref 3.87–5.11)
RDW: 13 % (ref 11.5–15.5)
WBC: 14.8 10*3/uL — ABNORMAL HIGH (ref 4.0–10.5)
nRBC: 0 % (ref 0.0–0.2)

## 2023-03-12 LAB — BASIC METABOLIC PANEL
Anion gap: 8 (ref 5–15)
Anion gap: 8 (ref 5–15)
BUN: 14 mg/dL (ref 8–23)
BUN: 14 mg/dL (ref 8–23)
CO2: 26 mmol/L (ref 22–32)
CO2: 25 mmol/L (ref 22–32)
Calcium: 8.8 mg/dL — ABNORMAL LOW (ref 8.9–10.3)
Calcium: 8.8 mg/dL — ABNORMAL LOW (ref 8.9–10.3)
Chloride: 102 mmol/L (ref 98–111)
Chloride: 103 mmol/L (ref 98–111)
Creatinine, Ser: 1.02 mg/dL — ABNORMAL HIGH (ref 0.44–1.00)
Creatinine, Ser: 0.93 mg/dL (ref 0.44–1.00)
GFR, Estimated: 51 mL/min — ABNORMAL LOW (ref >60)
GFR, Estimated: 57 mL/min — ABNORMAL LOW (ref >60)
Glucose, Bld: 209 mg/dL — ABNORMAL HIGH (ref 70–99)
Glucose, Bld: 187 mg/dL — ABNORMAL HIGH (ref 70–99)
Potassium: 3.8 mmol/L (ref 3.5–5.1)
Potassium: 3.4 mmol/L — ABNORMAL LOW (ref 3.5–5.1)
Sodium: 136 mmol/L (ref 135–145)
Sodium: 136 mmol/L (ref 135–145)

## 2023-03-12 LAB — CBC
HCT: 38.9 % (ref 36.0–46.0)
Hemoglobin: 12.9 g/dL (ref 12.0–15.0)
MCH: 30.7 pg (ref 26.0–34.0)
MCHC: 33.2 g/dL (ref 30.0–36.0)
MCV: 92.6 fL (ref 80.0–100.0)
Platelets: 228 10*3/uL (ref 150–400)
RBC: 4.2 MIL/uL (ref 3.87–5.11)
RDW: 13 % (ref 11.5–15.5)
WBC: 16.4 10*3/uL — ABNORMAL HIGH (ref 4.0–10.5)
nRBC: 0 % (ref 0.0–0.2)

## 2023-03-12 LAB — GLUCOSE, CAPILLARY
Glucose-Capillary: 153 mg/dL — ABNORMAL HIGH (ref 70–99)
Glucose-Capillary: 164 mg/dL — ABNORMAL HIGH (ref 70–99)
Glucose-Capillary: 223 mg/dL — ABNORMAL HIGH (ref 70–99)
Glucose-Capillary: 231 mg/dL — ABNORMAL HIGH (ref 70–99)

## 2023-03-12 LAB — TYPE AND SCREEN
ABO/RH(D): A POS
Antibody Screen: NEGATIVE

## 2023-03-12 LAB — ABO/RH: ABO/RH(D): A POS

## 2023-03-12 SURGERY — FIXATION, FRACTURE, INTERTROCHANTERIC, WITH INTRAMEDULLARY ROD
Anesthesia: General | Laterality: Left

## 2023-03-12 MED ORDER — INSULIN ASPART 100 UNIT/ML IJ SOLN
0.0000 [IU] | Freq: Three times a day (TID) | INTRAMUSCULAR | Status: DC
  Administered 2023-03-14: 3 [IU] via SUBCUTANEOUS
  Administered 2023-03-14 – 2023-03-15 (×2): 2 [IU] via SUBCUTANEOUS
  Administered 2023-03-15: 3 [IU] via SUBCUTANEOUS
  Administered 2023-03-15: 2 [IU] via SUBCUTANEOUS

## 2023-03-12 MED ORDER — FENTANYL CITRATE (PF) 100 MCG/2ML IJ SOLN
25.0000 ug | INTRAMUSCULAR | Status: DC | PRN
  Administered 2023-03-12 (×2): 25 ug via INTRAVENOUS

## 2023-03-12 MED ORDER — ROCURONIUM BROMIDE 10 MG/ML (PF) SYRINGE
PREFILLED_SYRINGE | INTRAVENOUS | Status: AC
  Filled 2023-03-12: qty 20

## 2023-03-12 MED ORDER — OXYCODONE HCL 5 MG PO TABS
2.5000 mg | ORAL_TABLET | ORAL | Status: DC | PRN
  Administered 2023-03-12: 2.5 mg via ORAL
  Filled 2023-03-12: qty 1

## 2023-03-12 MED ORDER — ONDANSETRON HCL 4 MG/2ML IJ SOLN
INTRAMUSCULAR | Status: AC
  Filled 2023-03-12: qty 2

## 2023-03-12 MED ORDER — DOCUSATE SODIUM 100 MG PO CAPS
100.0000 mg | ORAL_CAPSULE | Freq: Two times a day (BID) | ORAL | Status: DC
  Administered 2023-03-12 – 2023-03-15 (×7): 100 mg via ORAL
  Filled 2023-03-12 (×7): qty 1

## 2023-03-12 MED ORDER — DEXAMETHASONE SODIUM PHOSPHATE 10 MG/ML IJ SOLN
INTRAMUSCULAR | Status: AC
  Filled 2023-03-12: qty 2

## 2023-03-12 MED ORDER — POVIDONE-IODINE 10 % EX SWAB: 2.0000 | Freq: Once | CUTANEOUS | Status: DC

## 2023-03-12 MED ORDER — ONDANSETRON HCL 4 MG/2ML IJ SOLN
INTRAMUSCULAR | Status: AC
  Filled 2023-03-12: qty 4

## 2023-03-12 MED ORDER — FENTANYL CITRATE PF 50 MCG/ML IJ SOSY
12.5000 ug | PREFILLED_SYRINGE | INTRAMUSCULAR | Status: DC | PRN
  Administered 2023-03-12 (×3): 25 ug via INTRAVENOUS
  Administered 2023-03-12: 12.5 ug via INTRAVENOUS
  Administered 2023-03-13: 25 ug via INTRAVENOUS
  Filled 2023-03-12 (×5): qty 1

## 2023-03-12 MED ORDER — ALPRAZOLAM 0.5 MG PO TABS
1.0000 mg | ORAL_TABLET | Freq: Every evening | ORAL | Status: DC | PRN
  Administered 2023-03-12 – 2023-03-14 (×3): 1 mg via ORAL
  Filled 2023-03-12 (×3): qty 2

## 2023-03-12 MED ORDER — INSULIN ASPART 100 UNIT/ML IJ SOLN
0.0000 [IU] | Freq: Every day | INTRAMUSCULAR | Status: DC
  Administered 2023-03-12: 2 [IU] via SUBCUTANEOUS

## 2023-03-12 MED ORDER — PHENYLEPHRINE 80 MCG/ML (10ML) SYRINGE FOR IV PUSH (FOR BLOOD PRESSURE SUPPORT)
PREFILLED_SYRINGE | INTRAVENOUS | Status: DC | PRN
  Administered 2023-03-12: 160 ug via INTRAVENOUS

## 2023-03-12 MED ORDER — PHENYLEPHRINE 80 MCG/ML (10ML) SYRINGE FOR IV PUSH (FOR BLOOD PRESSURE SUPPORT)
PREFILLED_SYRINGE | INTRAVENOUS | Status: AC
  Filled 2023-03-12: qty 10

## 2023-03-12 MED ORDER — TRANEXAMIC ACID-NACL 1000-0.7 MG/100ML-% IV SOLN
1000.0000 mg | Freq: Once | INTRAVENOUS | Status: AC
  Administered 2023-03-12: 1000 mg via INTRAVENOUS
  Filled 2023-03-12: qty 100

## 2023-03-12 MED ORDER — ONDANSETRON HCL 4 MG/2ML IJ SOLN: 4.0000 mg | Freq: Once | INTRAMUSCULAR | Status: DC | PRN

## 2023-03-12 MED ORDER — ONDANSETRON HCL 4 MG/2ML IJ SOLN
4.0000 mg | Freq: Four times a day (QID) | INTRAMUSCULAR | Status: DC | PRN
  Administered 2023-03-12: 4 mg via INTRAVENOUS
  Filled 2023-03-12: qty 2

## 2023-03-12 MED ORDER — CEFAZOLIN SODIUM-DEXTROSE 2-4 GM/100ML-% IV SOLN
2.0000 g | INTRAVENOUS | Status: AC
  Administered 2023-03-12: 2 g via INTRAVENOUS
  Filled 2023-03-12: qty 100

## 2023-03-12 MED ORDER — METOCLOPRAMIDE HCL 5 MG/ML IJ SOLN: 5.0000 mg | Freq: Three times a day (TID) | INTRAMUSCULAR | Status: DC | PRN

## 2023-03-12 MED ORDER — PHENYLEPHRINE HCL-NACL 20-0.9 MG/250ML-% IV SOLN
INTRAVENOUS | Status: DC | PRN
  Administered 2023-03-12: 25 ug/min via INTRAVENOUS

## 2023-03-12 MED ORDER — CHLORHEXIDINE GLUCONATE 0.12 % MT SOLN
OROMUCOSAL | Status: AC
  Administered 2023-03-12: 15 mL via OROMUCOSAL
  Filled 2023-03-12: qty 15

## 2023-03-12 MED ORDER — FENTANYL CITRATE (PF) 100 MCG/2ML IJ SOLN
INTRAMUSCULAR | Status: AC
  Filled 2023-03-12: qty 2

## 2023-03-12 MED ORDER — ONDANSETRON HCL 4 MG/2ML IJ SOLN
INTRAMUSCULAR | Status: DC | PRN
  Administered 2023-03-12: 4 mg via INTRAVENOUS

## 2023-03-12 MED ORDER — METOPROLOL TARTRATE 50 MG PO TABS
100.0000 mg | ORAL_TABLET | Freq: Two times a day (BID) | ORAL | Status: DC
  Administered 2023-03-12 – 2023-03-15 (×8): 100 mg via ORAL
  Filled 2023-03-12 (×2): qty 2
  Filled 2023-03-12: qty 4
  Filled 2023-03-12 (×5): qty 2

## 2023-03-12 MED ORDER — LACTATED RINGERS IV SOLN: INTRAVENOUS | Status: DC

## 2023-03-12 MED ORDER — PROPOFOL 10 MG/ML IV BOLUS
INTRAVENOUS | Status: DC | PRN
  Administered 2023-03-12: 100 mg via INTRAVENOUS

## 2023-03-12 MED ORDER — FENTANYL CITRATE (PF) 100 MCG/2ML IJ SOLN
INTRAMUSCULAR | Status: AC
  Administered 2023-03-12: 50 ug via INTRAVENOUS
  Filled 2023-03-12: qty 2

## 2023-03-12 MED ORDER — ACETAMINOPHEN 10 MG/ML IV SOLN
INTRAVENOUS | Status: DC | PRN
  Administered 2023-03-12: 1000 mg via INTRAVENOUS

## 2023-03-12 MED ORDER — DEXAMETHASONE SODIUM PHOSPHATE 10 MG/ML IJ SOLN
INTRAMUSCULAR | Status: AC
  Filled 2023-03-12: qty 1

## 2023-03-12 MED ORDER — SENNOSIDES-DOCUSATE SODIUM 8.6-50 MG PO TABS: 1.0000 | ORAL_TABLET | Freq: Every evening | ORAL | Status: DC | PRN

## 2023-03-12 MED ORDER — LIDOCAINE 2% (20 MG/ML) 5 ML SYRINGE
INTRAMUSCULAR | Status: AC
  Filled 2023-03-12: qty 10

## 2023-03-12 MED ORDER — METOCLOPRAMIDE HCL 5 MG PO TABS: 5.0000 mg | ORAL_TABLET | Freq: Three times a day (TID) | ORAL | Status: DC | PRN

## 2023-03-12 MED ORDER — DIPHENHYDRAMINE HCL 12.5 MG/5ML PO ELIX: 12.5000 mg | ORAL_SOLUTION | ORAL | Status: DC | PRN

## 2023-03-12 MED ORDER — METHOCARBAMOL 500 MG PO TABS
500.0000 mg | ORAL_TABLET | Freq: Four times a day (QID) | ORAL | Status: DC | PRN
  Administered 2023-03-14: 500 mg via ORAL
  Filled 2023-03-12 (×3): qty 1

## 2023-03-12 MED ORDER — FENTANYL CITRATE (PF) 250 MCG/5ML IJ SOLN
INTRAMUSCULAR | Status: AC
  Filled 2023-03-12: qty 5

## 2023-03-12 MED ORDER — DEXMEDETOMIDINE HCL IN NACL 80 MCG/20ML IV SOLN
INTRAVENOUS | Status: AC
  Filled 2023-03-12: qty 20

## 2023-03-12 MED ORDER — OXYCODONE HCL 5 MG PO TABS
5.0000 mg | ORAL_TABLET | ORAL | Status: DC | PRN
  Administered 2023-03-13 – 2023-03-15 (×7): 5 mg via ORAL
  Filled 2023-03-12 (×8): qty 1

## 2023-03-12 MED ORDER — CEFAZOLIN SODIUM-DEXTROSE 2-4 GM/100ML-% IV SOLN
2.0000 g | Freq: Three times a day (TID) | INTRAVENOUS | Status: AC
  Administered 2023-03-12 – 2023-03-13 (×3): 2 g via INTRAVENOUS
  Filled 2023-03-12 (×3): qty 100

## 2023-03-12 MED ORDER — HALOPERIDOL LACTATE 5 MG/ML IJ SOLN: 2.0000 mg | Freq: Four times a day (QID) | INTRAMUSCULAR | Status: DC | PRN

## 2023-03-12 MED ORDER — LIDOCAINE 2% (20 MG/ML) 5 ML SYRINGE
INTRAMUSCULAR | Status: DC | PRN
  Administered 2023-03-12: 60 mg via INTRAVENOUS

## 2023-03-12 MED ORDER — LIDOCAINE 2% (20 MG/ML) 5 ML SYRINGE
INTRAMUSCULAR | Status: AC
  Filled 2023-03-12: qty 5

## 2023-03-12 MED ORDER — ONDANSETRON HCL 4 MG PO TABS: 4.0000 mg | ORAL_TABLET | Freq: Four times a day (QID) | ORAL | Status: DC | PRN

## 2023-03-12 MED ORDER — FENTANYL CITRATE (PF) 100 MCG/2ML IJ SOLN: 50.0000 ug | Freq: Once | INTRAMUSCULAR | Status: AC

## 2023-03-12 MED ORDER — CHLORHEXIDINE GLUCONATE 4 % EX LIQD: 60.0000 mL | Freq: Once | CUTANEOUS | Status: DC

## 2023-03-12 MED ORDER — POLYETHYLENE GLYCOL 3350 17 G PO PACK: 17.0000 g | PACK | Freq: Every day | ORAL | Status: DC | PRN

## 2023-03-12 MED ORDER — FENTANYL CITRATE (PF) 250 MCG/5ML IJ SOLN
INTRAMUSCULAR | Status: DC | PRN
  Administered 2023-03-12: 50 ug via INTRAVENOUS

## 2023-03-12 MED ORDER — ROCURONIUM BROMIDE 10 MG/ML (PF) SYRINGE
PREFILLED_SYRINGE | INTRAVENOUS | Status: DC | PRN
  Administered 2023-03-12: 80 mg via INTRAVENOUS

## 2023-03-12 MED ORDER — FAMOTIDINE 20 MG PO TABS
20.0000 mg | ORAL_TABLET | Freq: Every day | ORAL | Status: DC
  Administered 2023-03-12 – 2023-03-15 (×4): 20 mg via ORAL
  Filled 2023-03-12 (×4): qty 1

## 2023-03-12 MED ORDER — 0.9 % SODIUM CHLORIDE (POUR BTL) OPTIME
TOPICAL | Status: DC | PRN
  Administered 2023-03-12: 1000 mL

## 2023-03-12 MED ORDER — DEXAMETHASONE SODIUM PHOSPHATE 10 MG/ML IJ SOLN
INTRAMUSCULAR | Status: DC | PRN
  Administered 2023-03-12: 5 mg via INTRAVENOUS

## 2023-03-12 MED ORDER — CHLORHEXIDINE GLUCONATE 0.12 % MT SOLN
15.0000 mL | OROMUCOSAL | Status: AC
  Filled 2023-03-12: qty 15

## 2023-03-12 MED ORDER — SUGAMMADEX SODIUM 200 MG/2ML IV SOLN
INTRAVENOUS | Status: DC | PRN
  Administered 2023-03-12: 200 mg via INTRAVENOUS

## 2023-03-12 MED ORDER — INSULIN ASPART 100 UNIT/ML IJ SOLN
0.0000 [IU] | INTRAMUSCULAR | Status: DC
  Administered 2023-03-12: 1 [IU] via SUBCUTANEOUS
  Administered 2023-03-12: 2 [IU] via SUBCUTANEOUS

## 2023-03-12 SURGICAL SUPPLY — 49 items
ADH SKN CLS APL DERMABOND .7 (GAUZE/BANDAGES/DRESSINGS) ×1
APL PRP STRL LF DISP 70% ISPRP (MISCELLANEOUS) ×1
BAG COUNTER SPONGE SURGICOUNT (BAG) IMPLANT
BAG SPNG CNTER NS LX DISP (BAG)
BIT DRILL INTERTAN LAG SCREW (BIT) IMPLANT
BIT DRILL SHORT 4.0 (BIT) IMPLANT
BRUSH SCRUB EZ PLAIN DRY (MISCELLANEOUS) ×2 IMPLANT
CHLORAPREP W/TINT 26 (MISCELLANEOUS) ×1 IMPLANT
COVER PERINEAL POST (MISCELLANEOUS) ×1 IMPLANT
COVER SURGICAL LIGHT HANDLE (MISCELLANEOUS) ×1 IMPLANT
DERMABOND ADVANCED .7 DNX12 (GAUZE/BANDAGES/DRESSINGS) ×1 IMPLANT
DRAPE C-ARM 35X43 STRL (DRAPES) ×1 IMPLANT
DRAPE IMP U-DRAPE 54X76 (DRAPES) ×2 IMPLANT
DRAPE INCISE IOBAN 66X45 STRL (DRAPES) ×1 IMPLANT
DRAPE STERI IOBAN 125X83 (DRAPES) ×1 IMPLANT
DRAPE SURG 17X23 STRL (DRAPES) ×2 IMPLANT
DRAPE U-SHAPE 47X51 STRL (DRAPES) ×1 IMPLANT
DRESSING MEPILEX FLEX 4X4 (GAUZE/BANDAGES/DRESSINGS) ×1 IMPLANT
DRILL BIT SHORT 4.0 (BIT) ×1
DRSG MEPILEX FLEX 4X4 (GAUZE/BANDAGES/DRESSINGS) ×3
DRSG MEPILEX POST OP 4X8 (GAUZE/BANDAGES/DRESSINGS) ×1 IMPLANT
ELECT REM PT RETURN 9FT ADLT (ELECTROSURGICAL) ×1
ELECTRODE REM PT RTRN 9FT ADLT (ELECTROSURGICAL) ×1 IMPLANT
GLOVE BIO SURGEON STRL SZ 6.5 (GLOVE) ×3 IMPLANT
GLOVE BIO SURGEON STRL SZ7.5 (GLOVE) ×4 IMPLANT
GLOVE BIOGEL PI IND STRL 6.5 (GLOVE) ×1 IMPLANT
GLOVE BIOGEL PI IND STRL 7.5 (GLOVE) ×1 IMPLANT
GOWN STRL REUS W/ TWL LRG LVL3 (GOWN DISPOSABLE) ×1 IMPLANT
GOWN STRL REUS W/TWL LRG LVL3 (GOWN DISPOSABLE) ×1
GUIDE PIN 3.2X343 (PIN) ×2
GUIDE PIN 3.2X343MM (PIN) ×2
KIT BASIN OR (CUSTOM PROCEDURE TRAY) ×1 IMPLANT
KIT TURNOVER KIT B (KITS) ×1 IMPLANT
MANIFOLD NEPTUNE II (INSTRUMENTS) ×1 IMPLANT
NAIL LOCK CANN 10X380 130D LT (Nail) IMPLANT
NS IRRIG 1000ML POUR BTL (IV SOLUTION) ×1 IMPLANT
PACK GENERAL/GYN (CUSTOM PROCEDURE TRAY) ×1 IMPLANT
PAD ARMBOARD 7.5X6 YLW CONV (MISCELLANEOUS) ×2 IMPLANT
PIN GUIDE 3.2X343MM (PIN) IMPLANT
SCREW LAG COMPR KIT 100/95 (Screw) IMPLANT
SCREW TRIGEN LOW PROF 5.0X42.5 (Screw) IMPLANT
SUT MNCRL AB 3-0 PS2 18 (SUTURE) ×1 IMPLANT
SUT MNCRL AB 3-0 PS2 27 (SUTURE) IMPLANT
SUT VIC AB 0 CT1 27 (SUTURE)
SUT VIC AB 0 CT1 27XBRD ANBCTR (SUTURE) IMPLANT
SUT VIC AB 2-0 CT1 27 (SUTURE) ×2
SUT VIC AB 2-0 CT1 TAPERPNT 27 (SUTURE) ×2 IMPLANT
TOWEL GREEN STERILE (TOWEL DISPOSABLE) ×2 IMPLANT
WATER STERILE IRR 1000ML POUR (IV SOLUTION) ×1 IMPLANT

## 2023-03-12 NOTE — TOC CAGE-AID Note (Signed)
Transition of Care [facility]) - CAGE-AID Screening  Patient Details  Name: Tanya Ho MRN: [id number] Date of Birth: 22-Nov-1926  Clinical Narrative:  Patient unable to participate in screening at this time. Oriented only to person, delirium post surgery today.  CAGE-AID Screening: Substance Abuse Screening unable to be completed due to: : Patient unable to participate

## 2023-03-12 NOTE — Progress Notes (Signed)
Called daughter [name] to ask the last few questions needed for her admission to be complete no answer

## 2023-03-12 NOTE — Progress Notes (Signed)
  Progress Note   Patient: Tanya Ho [id number] DOB: Jun 22, 1926 DOA: 03/11/2023     0 DOS: the patient was seen and examined on 03/12/2023   Brief hospital course: 89 y.o. female with medical history significant for hypertension, nonobstructive CAD, chronic HFpEF, and atrial fibrillation on Eliquis who presents emergency department with left hip pain after fall at home.   Plain radiographs of the left hip and pelvis demonstrate left intertrochanteric femur fracture.   Assessment and Plan: 1. Left hip fracture -Home eliquis has been on hold in anticipation for surgery -Orthopedic Surgery consulted and pt is now s/p nailing of L intertrochanteric femur fracture -f/u with PT/OT recs post op -would resume eliquis when OK with surgery   2. Atrial fibrillation   - Held Eliquis perioperatively, would resume when OK with surgery - continue metoprolol as tolerated   3. Hypertensive urgency  - BP currently stable - Continue metoprolol -continue analgesia as needed     4. Chronic HFpEF  - Appeared compensated   - Continue beta-blocker -follow I/O's   5. CAD  - Hx of non-obstructive CAD  - denied chest pains this AM -cont beta blocker   6. Hyperglycemia  - Presenting serum glucose is 209 in ED  - Likely stress reaction vs new DM  - A1c pending - Continue SSI   7. CKD IIIa  - Renally-dose medications -Cr stable  8. Hypokalemia      Subjective: Very anxious about surgery today  Physical Exam: Vitals:   03/12/23 1600 03/12/23 1615 03/12/23 1630 03/12/23 1647  BP: (!) 119/59 102/69 96/73 124/76  Pulse: 67 66 72 81  Resp: 16 17 16    Temp:   (!) 97.5 F (36.4 C) (!) 97.5 F (36.4 C)  TempSrc:    Oral  SpO2: 92% 94% 95% 97%  Weight:      Height:       General exam: Awake, laying in bed, in nad Respiratory system: Normal respiratory effort, no wheezing Cardiovascular system: regular rate, s1, s2 Gastrointestinal system: Soft, nondistended, positive BS Central  nervous system: CN2-12 grossly intact, strength intact Extremities: Perfused, no clubbing Skin: Normal skin turgor, no notable skin lesions seen Psychiatry: Mood normal // no visual hallucinations   Data Reviewed:  Labs reviewed: Na 136, K 3.4, Cr 0.93 WBC 16.4  Family Communication: Pt in room, family not at bedside  Disposition: Status is: Inpatient Remains inpatient appropriate because: Severity of illness  Planned Discharge Destination:  Unclear at this time, pending PT eval    Author: Marylu Lund, MD 03/12/2023 5:16 PM  For on call review [email or web address].

## 2023-03-12 NOTE — Consult Note (Signed)
Reason for Consult:Left hip fx Referring Physician: Marylu Lund Time called: [id number] Time at bedside: 0930   Tanya Ho is an 89 y.o. female.  HPI: Bellamarie was at home when she heard a strange noise outside. She got out of bed to go look but fell before she got there. She's unsure what made her fall. She had immediate pain in her left hip and could not get up. She crawled to the phone and was able to call for help. She was brought to the ED where x-rays showed a hip fx and orthopedic surgery was consulted. She lives at home alone and uses a RW to ambulate.  Past Medical History:  Diagnosis Date   Back pain    Bruises easily    Coronary artery disease    NONOBSTRUCTIVE   Fatigue    Hyperlipidemia    Hypertension    Knee pain    OA (osteoarthritis)    Obesity    Palpitations    SOB (shortness of breath)     Past Surgical History:  Procedure Laterality Date   BREAST BIOPSY     LEFT BREAST   CARDIAC CATHETERIZATION  05/11/2006   OVERALL CARDIAC SIZE AND SILHOUETTE ARE NORMAL. EF ESTIMATED 60%   KIDNEY STONES  1982   KNEE ARTHROSCOPY  09/22/2008   VAGINAL HYSTERECTOMY      Family History  Problem Relation Age of Onset   Heart disease Mother    Stroke Mother    Heart attack Father     Social History:  reports that she has never smoked. She has never used smokeless tobacco. She reports that she does not drink alcohol and does not use drugs.  Allergies:  Allergies  Allergen Reactions   Codeine    Demerol    Morphine And Related     Medications: I have reviewed the patient's current medications.  Results for orders placed or performed during the hospital encounter of 03/11/23 (from the past 48 hour(s))  CBC with Differential     Status: Abnormal   Collection Time: 03/11/23 11:57 PM  Result Value Ref Range   WBC 14.8 (H) 4.0 - 10.5 K/uL   RBC 4.26 3.87 - 5.11 MIL/uL   Hemoglobin 13.3 12.0 - 15.0 g/dL   HCT 39.7 36.0 - 46.0 %   MCV 93.2 80.0 - 100.0 fL   MCH 31.2 26.0  - 34.0 pg   MCHC 33.5 30.0 - 36.0 g/dL   RDW 13.0 11.5 - 15.5 %   Platelets 235 150 - 400 K/uL   nRBC 0.0 0.0 - 0.2 %   Neutrophils Relative % 76 %   Neutro Abs 11.5 (H) 1.7 - 7.7 K/uL   Lymphocytes Relative 15 %   Lymphs Abs 2.2 0.7 - 4.0 K/uL   Monocytes Relative 6 %   Monocytes Absolute 0.8 0.1 - 1.0 K/uL   Eosinophils Relative 1 %   Eosinophils Absolute 0.1 0.0 - 0.5 K/uL   Basophils Relative 1 %   Basophils Absolute 0.1 0.0 - 0.1 K/uL   Immature Granulocytes 1 %   Abs Immature Granulocytes 0.17 (H) 0.00 - 0.07 K/uL    Comment: Performed at [facility], [address] [place], [place] [id number]  Basic metabolic panel     Status: Abnormal   Collection Time: 03/11/23 11:57 PM  Result Value Ref Range   Sodium 136 135 - 145 mmol/L   Potassium 3.8 3.5 - 5.1 mmol/L   Chloride 102 98 - 111  mmol/L   CO2 26 22 - 32 mmol/L   Glucose, Bld 209 (H) 70 - 99 mg/dL    Comment: Glucose reference range applies only to samples taken after fasting for at least 8 hours.   BUN 14 8 - 23 mg/dL   Creatinine, Ser 1.02 (H) 0.44 - 1.00 mg/dL   Calcium 8.8 (L) 8.9 - 10.3 mg/dL   GFR, Estimated 51 (L) >60 mL/min    Comment: (NOTE) Calculated using the CKD-EPI Creatinine Equation (2021)    Anion gap 8 5 - 15    Comment: Performed at [facility] [address], [place], [place] [address]  Type and screen [place]     Status: None   Collection Time: 03/12/23  4:20 AM  Result Value Ref Range   ABO/RH(D) A POS    Antibody Screen NEG    Sample Expiration      03/15/2023,2359 Performed at [facility], [place] [address], [place], [place] [address]   CBG monitoring, ED     Status: Abnormal   Collection Time: 03/12/23  4:22 AM  Result Value Ref Range   Glucose-Capillary 201 (H) 70 - 99 mg/dL    Comment: Glucose reference range applies only to samples taken after fasting for at least 8 hours.  CBC     Status: Abnormal   Collection Time: 03/12/23  4:25 AM   Result Value Ref Range   WBC 16.4 (H) 4.0 - 10.5 K/uL   RBC 4.20 3.87 - 5.11 MIL/uL   Hemoglobin 12.9 12.0 - 15.0 g/dL   HCT 38.9 36.0 - 46.0 %   MCV 92.6 80.0 - 100.0 fL   MCH 30.7 26.0 - 34.0 pg   MCHC 33.2 30.0 - 36.0 g/dL   RDW 13.0 11.5 - 15.5 %   Platelets 228 150 - 400 K/uL   nRBC 0.0 0.0 - 0.2 %    Comment: Performed at [facility], [place] [address], [place], [place] [id number]  Basic metabolic panel     Status: Abnormal   Collection Time: 03/12/23  4:25 AM  Result Value Ref Range   Sodium 136 135 - 145 mmol/L   Potassium 3.4 (L) 3.5 - 5.1 mmol/L   Chloride 103 98 - 111 mmol/L   CO2 25 22 - 32 mmol/L   Glucose, Bld 187 (H) 70 - 99 mg/dL    Comment: Glucose reference range applies only to samples taken after fasting for at least 8 hours.   BUN 14 8 - 23 mg/dL   Creatinine, Ser 0.93 0.44 - 1.00 mg/dL   Calcium 8.8 (L) 8.9 - 10.3 mg/dL   GFR, Estimated 57 (L) >60 mL/min    Comment: (NOTE) Calculated using the CKD-EPI Creatinine Equation (2021)    Anion gap 8 5 - 15    Comment: Performed at [facility] [address], [place], [place] [address]  ABO/Rh     Status: None   Collection Time: 03/12/23  4:25 AM  Result Value Ref Range   ABO/RH(D)      A POS Performed at [facility] [address], [place], [place] [address]   CBG monitoring, ED     Status: Abnormal   Collection Time: 03/12/23  7:41 AM  Result Value Ref Range   Glucose-Capillary 186 (H) 70 - 99 mg/dL    Comment: Glucose reference range applies only to samples taken after fasting for at least 8 hours.  DG FEMUR PORT MIN 2 VIEWS LEFT  Result Date: 03/12/2023 CLINICAL DATA:  Femoral fracture. EXAM: LEFT FEMUR PORTABLE 2 VIEWS COMPARISON:  None Available. FINDINGS: There is a comminuted impacted fracture of the left proximal femur, slightly inferior to the greater trochanter and involving the lesser trochanter. Butterfly fragment is seen medially. The left hip joint is normally located.  IMPRESSION: Comminuted impacted fracture of the left proximal femur, slightly inferior to the greater trochanter and involving the lesser trochanter. Electronically Signed   By: Fidela Salisbury M.D.   On: 03/12/2023 08:23   DG Chest Portable 1 View  Result Date: 03/12/2023 CLINICAL DATA:  Recent fall with known left hip fracture EXAM: PORTABLE CHEST 1 VIEW COMPARISON:  06/02/2015 FINDINGS: Cardiac shadow is enlarged. Aortic calcifications are seen. Mild central vascular congestion is noted without edema. No focal infiltrate is seen. No bony abnormality is noted. IMPRESSION: Mild central vascular congestion. Electronically Signed   By: Inez Catalina M.D.   On: 03/12/2023 00:32   DG Hip Unilat W or Wo Pelvis 2-3 Views Left  Result Date: 03/12/2023 CLINICAL DATA:  Recent trip and fall with left hip pain, initial encounter EXAM: DG HIP (WITH OR WITHOUT PELVIS) 2-V LEFT COMPARISON:  None Available. FINDINGS: Comminuted proximal left intratrochanteric femoral fracture is noted with impaction and angulation at the fracture site. Pelvic ring appears intact. Vascular calcifications are noted. IMPRESSION: Left intratrochanteric femoral fracture. Electronically Signed   By: Inez Catalina M.D.   On: 03/12/2023 00:31   CT Head Wo Contrast  Result Date: 03/12/2023 CLINICAL DATA:  Recent trip and fall with headaches, initial encounter EXAM: CT HEAD WITHOUT CONTRAST TECHNIQUE: Contiguous axial images were obtained from the base of the skull through the vertex without intravenous contrast. RADIATION DOSE REDUCTION: This exam was performed according to the departmental dose-optimization program which includes automated exposure control, adjustment of the mA and/or kV according to patient size and/or use of iterative reconstruction technique. COMPARISON:  None Available. FINDINGS: Brain: No evidence of acute infarction, hemorrhage, hydrocephalus, extra-axial collection or mass lesion/mass effect. Chronic atrophic and  ischemic changes are noted. Vascular: No hyperdense vessel or unexpected calcification. Skull: Normal. Negative for fracture or focal lesion. Sinuses/Orbits: No acute finding. Other: None. IMPRESSION: Chronic atrophic and ischemic changes without acute abnormality. Electronically Signed   By: Inez Catalina M.D.   On: 03/12/2023 00:30    Review of Systems  HENT:  Negative for ear discharge, ear pain, hearing loss and tinnitus.   Eyes:  Negative for photophobia and pain.  Respiratory:  Negative for cough and shortness of breath.   Cardiovascular:  Negative for chest pain.  Gastrointestinal:  Negative for abdominal pain, nausea and vomiting.  Genitourinary:  Negative for dysuria, flank pain, frequency and urgency.  Musculoskeletal:  Positive for arthralgias (Left hip). Negative for back pain, myalgias and neck pain.  Neurological:  Negative for dizziness and headaches.  Hematological:  Does not bruise/bleed easily.  Psychiatric/Behavioral:  The patient is not nervous/anxious.    Blood pressure (!) 160/94, pulse 72, temperature 98.1 F (36.7 C), temperature source Oral, resp. rate 15, height 5\' 6"  (1.676 m), weight 80 kg, SpO2 99 %. Physical Exam Constitutional:      General: She is not in acute distress.    Appearance: She is well-developed. She is not diaphoretic.  HENT:     Head: Normocephalic and atraumatic.  Eyes:     General: No scleral icterus.       Right eye: No discharge.  Left eye: No discharge.     Conjunctiva/sclera: Conjunctivae normal.  Cardiovascular:     Rate and Rhythm: Normal rate and regular rhythm.  Pulmonary:     Effort: Pulmonary effort is normal. No respiratory distress.  Musculoskeletal:     Cervical back: Normal range of motion.     Comments: LLE No traumatic wounds, ecchymosis, or rash  Mod TTP hip  No knee or ankle effusion  Knee stable to varus/ valgus and anterior/posterior stress but mod painful  Sens DPN, SPN, TN intact  Motor EHL, ext, flex,  evers 5/5  DP 2+, PT 2+, No significant edema  Skin:    General: Skin is warm and dry.  Neurological:     Mental Status: She is alert.  Psychiatric:        Mood and Affect: Mood normal.        Behavior: Behavior normal.     Assessment/Plan: Left hip fx -- Plan IMN today with Dr. Doreatha Martin. Please keep NPO. Multiple medical problems including hypertension, nonobstructive CAD, chronic HFpEF, and atrial fibrillation on Eliquis -- per primary service    Lisette Abu, PA-C Orthopedic Surgery [phone] 03/12/2023, 9:51 AM

## 2023-03-12 NOTE — Progress Notes (Signed)
Initial Nutrition Assessment  DOCUMENTATION CODES:   Not applicable  INTERVENTION:  Once diet resumes, recommend Automatic trays Regular diet for widest variety of menu options Ensure Enlive po BID, each supplement provides 350 kcal and 20 grams of protein. MVI with minerals daily  NUTRITION DIAGNOSIS:   Increased nutrient needs related to hip fracture, post-op healing as evidenced by estimated needs  GOAL:   Patient will meet greater than or equal to 90% of their needs  MONITOR:   Labs, Weight trends, Diet advancement, Supplement acceptance, Skin  REASON FOR ASSESSMENT:   Consult Hip fracture protocol  ASSESSMENT:   Pt admitted after a fall leading to L hip fracture. PMH significant for HTN, nonobstructive CAD, chronic HFpEF, and afib.  Plans for IMN today  Spoke with pt at bedside. C/o hip pain and dry mouth d/t NPO status. Made RN aware.   Pt's daughter at bedside assisted with nutrition related history as pt was pleasantly confused and easily distracted/forgetful. She states that her appetite has been gradually declining. Pt's daughter reports that despite this, she remains eating about 3 meals per day including coffee and cereal for breakfast, and lunch/dinner may include potato soup, pot of beans, sausage biscuits or a hamburger. Pt lives alone and still cooks for herself.   Pt denies difficulty chewing or swallowing foods. She typically ambulates with a walker.   Pt recalls a weight loss of 35 lbs which her daughter reports has been a gradual decline over time. Unfortunately, weight history on file is limited. Her weight was documented to be 81.4 kg on 07/14/22. Current weight appears to be 80 kg. Will continue to monitor throughout admission.   Medications: pepcid, SSI 0-6 units q4h  Labs: potassium 3.4, GFR 57, CBG's 153-201 x24 hours  NUTRITION - FOCUSED PHYSICAL EXAM:  Flowsheet Row Most Recent Value  Orbital Region No depletion  Upper Arm Region No  depletion  Thoracic and Lumbar Region No depletion  Buccal Region Moderate depletion  Temple Region No depletion  Clavicle Bone Region Mild depletion  Clavicle and Acromion Bone Region No depletion  Scapular Bone Region No depletion  Dorsal Hand Severe depletion  Patellar Region No depletion  Anterior Thigh Region No depletion  Posterior Calf Region Mild depletion  Edema (RD Assessment) None  Hair Reviewed  Eyes Reviewed  Mouth Reviewed  Skin Reviewed  Nails Reviewed       Diet Order:   Diet Order             Diet NPO time specified Except for: Sips with Meds  Diet effective now                   EDUCATION NEEDS:   Education needs have been addressed  Skin:  Skin Assessment: Reviewed RN Assessment  Last BM:  3/25  Height:   Ht Readings from Last 1 Encounters:  03/12/23 5\' 6"  (1.676 m)    Weight:   Wt Readings from Last 1 Encounters:  03/12/23 80 kg   BMI:  Body mass index is 28.47 kg/m.  Estimated Nutritional Needs:   Kcal:  1700-1900  Protein:  95-110g  Fluid:  >/=1.7L  Clayborne Dana, RDN, LDN Clinical Nutrition

## 2023-03-12 NOTE — Anesthesia Preprocedure Evaluation (Addendum)
Anesthesia Evaluation  Patient identified by MRN, date of birth, ID band Patient awake    Reviewed: Allergy & Precautions, NPO status , Patient's Chart, lab work & pertinent test results, reviewed documented beta blocker date and time   Airway Mallampati: II  TM Distance: >3 FB Neck ROM: Full    Dental no notable dental hx.    Pulmonary neg pulmonary ROS   Pulmonary exam normal breath sounds clear to auscultation       Cardiovascular hypertension (134/56), Pt. on medications and Pt. on home beta blockers + CAD  Normal cardiovascular exam+ dysrhythmias (eliquis LD yesterday) Atrial Fibrillation  Rhythm:Regular Rate:Normal  Echo 2019 - Left ventricle: The cavity size was normal. Wall thickness was    normal. Systolic function was normal. The estimated ejection    fraction was in the range of 55% to 60%.  - Mitral valve: Calcified annulus.  - Right atrium: The atrium was mildly dilated.  - Tricuspid valve: There was mild-moderate regurgitation.  - Pulmonary arteries: PA peak pressure: 37 mm Hg (S).     Neuro/Psych negative neurological ROS  negative psych ROS   GI/Hepatic negative GI ROS, Neg liver ROS,,,  Endo/Other  negative endocrine ROS    Renal/GU negative Renal ROS  negative genitourinary   Musculoskeletal  (+) Arthritis , Osteoarthritis,    Abdominal   Peds  Hematology Hb 12.9, plt 228   Anesthesia Other Findings   Reproductive/Obstetrics negative OB ROS                             Anesthesia Physical Anesthesia Plan  ASA: 3  Anesthesia Plan: General   Post-op Pain Management: Ofirmev IV (intra-op)*   Induction: Intravenous  PONV Risk Score and Plan: 3 and Ondansetron, Dexamethasone and Treatment may vary due to age or medical condition  Airway Management Planned: Oral ETT  Additional Equipment: None  Intra-op Plan:   Post-operative Plan: Extubation in OR  Informed  Consent: I have reviewed the patients History and Physical, chart, labs and discussed the procedure including the risks, benefits and alternatives for the proposed anesthesia with the patient or authorized representative who has indicated his/her understanding and acceptance.     Dental advisory given  Plan Discussed with: CRNA  Anesthesia Plan Comments: (Has not been off eliquis for appropriate time- will do gen/ETT Consented daughter)        Anesthesia Quick Evaluation

## 2023-03-12 NOTE — Anesthesia Procedure Notes (Signed)
Procedure Name: Intubation Date/Time: 03/12/2023 2:02 PM  Performed by: Carolan Clines, CRNAPre-anesthesia Checklist: Patient identified, Emergency Drugs available, Suction available and Patient being monitored Patient Re-evaluated:Patient Re-evaluated prior to induction Oxygen Delivery Method: Circle System Utilized Preoxygenation: Pre-oxygenation with 100% oxygen Induction Type: IV induction Ventilation: Mask ventilation without difficulty Laryngoscope Size: Mac and 3 Grade View: Grade I Tube type: Oral Tube size: 7.0 mm Number of attempts: 1 Airway Equipment and Method: Stylet Placement Confirmation: ETT inserted through vocal cords under direct vision, positive ETCO2 and breath sounds checked- equal and bilateral Secured at: 21 cm Tube secured with: Tape Dental Injury: Teeth and Oropharynx as per pre-operative assessment

## 2023-03-12 NOTE — Progress Notes (Signed)
Orthopedic Tech Progress Note Patient Details:  CAMYLA ROBARDS 07/20/1926 [id number]  Patient ID: [name], female   DOB: March 17, 1926, 89 y.o.   MRN: [id number] Patient doesn't meet criteria for ohf. No one over 70 can have an ohf. Karolee Stamps 03/12/2023, 5:53 AM

## 2023-03-12 NOTE — ED Notes (Signed)
ED TO INPATIENT HANDOFF REPORT  ED Nurse Name and Phone #: [phone]  S Name/Age/Gender Tanya Ho 89 y.o. female Room/Bed: RESUSC/RESUSC  Code Status   Code Status: Full Code  Home/SNF/Other Home Patient oriented to: self, place, time, and situation Is this baseline? Yes   Triage Complete: Triage complete  Chief Complaint Closed left hip fracture, initial encounter [facility]) [S72.002A]  Triage Note  Patient arrives via ems from home secondary  to a fall. Patient states she was walking to the restroom and tripped and fell. No head strike, patient c/o lt hip pain, patient takes eliquis. No rotation noted, c collar placed.   Allergies Allergies  Allergen Reactions   Codeine    Demerol    Morphine And Related     Level of Care/Admitting Diagnosis ED Disposition     ED Disposition  Admit   Condition  --   [place]: [place] [[id number]]  Level of Care: Med-Surg [16]  May admit patient to [facility] or [facility] if equivalent level of care is available:: No  Covid Evaluation: Asymptomatic - no recent exposure (last 10 days) testing not required  Diagnosis: [id number]  Admitting Physician: Vianne Bulls [id number]  Attending Physician: Vianne Bulls [id number]  Certification:: I certify this patient will need inpatient services for at least 2 midnights  Estimated Length of Stay: 3          B Medical/Surgery History Past Medical History:  Diagnosis Date   Back pain    Bruises easily    Coronary artery disease    NONOBSTRUCTIVE   Fatigue    Hyperlipidemia    Hypertension    Knee pain    OA (osteoarthritis)    Obesity    Palpitations    SOB (shortness of breath)    Past Surgical History:  Procedure Laterality Date   BREAST BIOPSY     LEFT BREAST   CARDIAC CATHETERIZATION  05/11/2006   OVERALL CARDIAC SIZE AND SILHOUETTE ARE NORMAL. EF ESTIMATED 60%   KIDNEY STONES  1982    KNEE ARTHROSCOPY  09/22/2008   VAGINAL HYSTERECTOMY       A IV Location/Drains/Wounds Patient Lines/Drains/Airways Status     Active Line/Drains/Airways     Name Placement date Placement time Site Days   Peripheral IV 03/12/23 18 G Anterior;Proximal;Right Forearm 03/12/23  0015  Forearm  less than 1            Intake/Output Last 24 hours No intake or output data in the 24 hours ending 03/12/23 0509  Labs/Imaging Results for orders placed or performed during the hospital encounter of 03/11/23 (from the past 48 hour(s))  CBC with Differential     Status: Abnormal   Collection Time: 03/11/23 11:57 PM  Result Value Ref Range   WBC 14.8 (H) 4.0 - 10.5 K/uL   RBC 4.26 3.87 - 5.11 MIL/uL   Hemoglobin 13.3 12.0 - 15.0 g/dL   HCT 39.7 36.0 - 46.0 %   MCV 93.2 80.0 - 100.0 fL   MCH 31.2 26.0 - 34.0 pg   MCHC 33.5 30.0 - 36.0 g/dL   RDW 13.0 11.5 - 15.5 %   Platelets 235 150 - 400 K/uL   nRBC 0.0 0.0 - 0.2 %   Neutrophils Relative % 76 %   Neutro Abs 11.5 (H) 1.7 - 7.7 K/uL   Lymphocytes Relative 15 %   Lymphs Abs 2.2 0.7 -  4.0 K/uL   Monocytes Relative 6 %   Monocytes Absolute 0.8 0.1 - 1.0 K/uL   Eosinophils Relative 1 %   Eosinophils Absolute 0.1 0.0 - 0.5 K/uL   Basophils Relative 1 %   Basophils Absolute 0.1 0.0 - 0.1 K/uL   Immature Granulocytes 1 %   Abs Immature Granulocytes 0.17 (H) 0.00 - 0.07 K/uL    Comment: Performed at [facility] [address], [place], [place] [id number]  Basic metabolic panel     Status: Abnormal   Collection Time: 03/11/23 11:57 PM  Result Value Ref Range   Sodium 136 135 - 145 mmol/L   Potassium 3.8 3.5 - 5.1 mmol/L   Chloride 102 98 - 111 mmol/L   CO2 26 22 - 32 mmol/L   Glucose, Bld 209 (H) 70 - 99 mg/dL    Comment: Glucose reference range applies only to samples taken after fasting for at least 8 hours.   BUN 14 8 - 23 mg/dL   Creatinine, Ser 1.02 (H) 0.44 - 1.00 mg/dL   Calcium 8.8 (L) 8.9 - 10.3 mg/dL   GFR, Estimated  51 (L) >60 mL/min    Comment: (NOTE) Calculated using the CKD-EPI Creatinine Equation (2021)    Anion gap 8 5 - 15    Comment: Performed at [facility] [address] [place], [place] [address]  Type and screen [place]     Status: None (Preliminary result)   Collection Time: 03/12/23  4:20 AM  Result Value Ref Range   ABO/RH(D) PENDING    Antibody Screen PENDING    Sample Expiration      03/15/2023,2359 Performed at [facility], [place] [address], [place], [place] [address]   ABO/Rh     Status: None (Preliminary result)   Collection Time: 03/12/23  4:25 AM  Result Value Ref Range   ABO/RH(D) PENDING    DG Chest Portable 1 View  Result Date: 03/12/2023 CLINICAL DATA:  Recent fall with known left hip fracture EXAM: PORTABLE CHEST 1 VIEW COMPARISON:  06/02/2015 FINDINGS: Cardiac shadow is enlarged. Aortic calcifications are seen. Mild central vascular congestion is noted without edema. No focal infiltrate is seen. No bony abnormality is noted. IMPRESSION: Mild central vascular congestion. Electronically Signed   By: Inez Catalina M.D.   On: 03/12/2023 00:32   DG Hip Unilat W or Wo Pelvis 2-3 Views Left  Result Date: 03/12/2023 CLINICAL DATA:  Recent trip and fall with left hip pain, initial encounter EXAM: DG HIP (WITH OR WITHOUT PELVIS) 2-V LEFT COMPARISON:  None Available. FINDINGS: Comminuted proximal left intratrochanteric femoral fracture is noted with impaction and angulation at the fracture site. Pelvic ring appears intact. Vascular calcifications are noted. IMPRESSION: Left intratrochanteric femoral fracture. Electronically Signed   By: Inez Catalina M.D.   On: 03/12/2023 00:31   CT Head Wo Contrast  Result Date: 03/12/2023 CLINICAL DATA:  Recent trip and fall with headaches, initial encounter EXAM: CT HEAD WITHOUT CONTRAST TECHNIQUE: Contiguous axial images were obtained from the base of the skull through the vertex without intravenous contrast.  RADIATION DOSE REDUCTION: This exam was performed according to the departmental dose-optimization program which includes automated exposure control, adjustment of the mA and/or kV according to patient size and/or use of iterative reconstruction technique. COMPARISON:  None Available. FINDINGS: Brain: No evidence of acute infarction, hemorrhage, hydrocephalus, extra-axial collection or mass lesion/mass effect. Chronic atrophic and ischemic changes are noted. Vascular: No hyperdense vessel  or unexpected calcification. Skull: Normal. Negative for fracture or focal lesion. Sinuses/Orbits: No acute finding. Other: None. IMPRESSION: Chronic atrophic and ischemic changes without acute abnormality. Electronically Signed   By: Inez Catalina M.D.   On: 03/12/2023 00:30    Pending Labs Unresulted Labs (From admission, onward)     Start     Ordered   03/12/23 0500  CBC  Daily,   R      03/12/23 0332   03/12/23 [id number]  Basic metabolic panel  Daily,   R      03/12/23 0332   03/12/23 0500  Hemoglobin A1c  Tomorrow morning,   R       Comments: To assess prior glycemic control    03/12/23 0335            Vitals/Pain Today's Vitals   03/12/23 0400 03/12/23 0415 03/12/23 0430 03/12/23 0436  BP: (!) 154/90 (!) 160/83 (!) 148/72   Pulse: 76 70 75   Resp: 17 18 16    Temp:      TempSrc:      SpO2: 100% 99% 99%   Weight:      Height:      PainSc:    2     Isolation Precautions No active isolations  Medications Medications  metoprolol tartrate (LOPRESSOR) tablet 100 mg (100 mg Oral Given 03/12/23 0413)  ALPRAZolam (XANAX) tablet 1 mg (has no administration in time range)  famotidine (PEPCID) tablet 20 mg (has no administration in time range)  oxyCODONE (Oxy IR/ROXICODONE) immediate release tablet 2.5 mg (has no administration in time range)  methocarbamol (ROBAXIN) tablet 500 mg (has no administration in time range)  senna-docusate (Senokot-S) tablet 1 tablet (has no administration in time range)   fentaNYL (SUBLIMAZE) injection 12.5-25 mcg (25 mcg Intravenous Given 03/12/23 0413)  ondansetron (ZOFRAN) injection 4 mg (4 mg Intravenous Given 03/12/23 0413)  insulin aspart (novoLOG) injection 0-6 Units (2 Units Subcutaneous Given 03/12/23 0433)  fentaNYL (SUBLIMAZE) injection 50 mcg (50 mcg Intravenous Given 03/12/23 0036)    Mobility walks     Focused Assessments Cardiac Assessment Handoff:    No results found for: "CKTOTAL", "CKMB", "CKMBINDEX", "TROPONINI" No results found for: "DDIMER" Does the Patient currently have chest pain? No    R Recommendations: See Admitting Provider Note  Report given to:   Additional Notes:

## 2023-03-12 NOTE — ED Provider Notes (Addendum)
[facility] Provider Note   CSN: [id number] Arrival date & time: 03/11/23  2332     History  Chief Complaint  Patient presents with   Tanya Ho is a 89 y.o. female.  HPI     This is a 89 year old female with a history of atrial fibrillation on Eliquis who presents following a fall.  Patient lives alone.  She was ambulating to the bathroom with her walker.  She lost her balance and fell.  She is unsure whether she hit her head.  No loss of consciousness.  She is complaining of left hip pain.  Denies numbness or tingling in the leg.  Denies other injury.  Home Medications Prior to Admission medications   Medication Sig Start Date End Date Taking? Authorizing Provider  ALPRAZolam Duanne Moron) 1 MG tablet Take 1 mg by mouth at bedtime as needed. 03/20/18   [provider]  amLODipine (NORVASC) 2.5 MG tablet 2.5 mg daily. 08/29/20   [provider]  apixaban (ELIQUIS) 5 MG TABS tablet Take 1 tablet (5 mg total) by mouth 2 (two) times daily. 12/19/22   Freada Bergeron, MD  ergocalciferol (VITAMIN D2) 50000 UNITS capsule Take 50,000 Units by mouth once a week.    [provider]  famotidine (PEPCID) 20 MG tablet 1 tab Orally twice daily    [provider]  furosemide (LASIX) 20 MG tablet TAKE 1 TABLET EVERY DAY 10/24/22   Freada Bergeron, MD  metoprolol tartrate (LOPRESSOR) 100 MG tablet Take 1 tablet (100 mg total) by mouth 2 (two) times daily. You may take an additional 1/2 tablet (50 mg total) by mouth daily as needed for palpitations. 02/01/23   Freada Bergeron, MD  nitroGLYCERIN (NITROSTAT) 0.4 MG SL tablet Place 1 tablet (0.4 mg total) under the tongue every 5 (five) minutes as needed for chest pain. 09/08/20   Burtis Junes, NP  traMADol (ULTRAM) 50 MG tablet TAKE 1-2 TABLETS BY MOUTH EVERY 6-8 HOURS AS NEEDED FOR PAIN 03/22/15   [provider]      Allergies    Codeine,  Demerol, and Morphine and related    Review of Systems   Review of Systems  Constitutional:  Negative for fever.  Respiratory:  Negative for shortness of breath.   Cardiovascular:  Negative for chest pain.  Gastrointestinal:  Negative for abdominal pain.  Musculoskeletal:        Hip pain  Skin:        Wound left hand  Neurological:  Negative for syncope.  All other systems reviewed and are negative.   Physical Exam Updated Vital Signs BP (!) 155/94   Pulse 68   Temp 98 F (36.7 C) (Oral)   Resp 15   Ht 1.676 m (5\' 6" )   Wt 80 kg   SpO2 99%   BMI 28.47 kg/m  Physical Exam Vitals and nursing note reviewed.  Constitutional:      Appearance: She is well-developed. She is not ill-appearing.     Comments: ABCs intact  HENT:     Head: Normocephalic and atraumatic.     Mouth/Throat:     Mouth: Mucous membranes are moist.  Eyes:     Pupils: Pupils are equal, round, and reactive to light.  Neck:     Comments: C-collar in place Cardiovascular:     Rate and Rhythm: Normal rate. Rhythm irregular.     Heart sounds: Normal heart  sounds.  Pulmonary:     Effort: Pulmonary effort is normal. No respiratory distress.     Breath sounds: No wheezing.  Abdominal:     Palpations: Abdomen is soft.  Musculoskeletal:     Comments: Left lower extremity externally repaired, tenderness palpation left hip, 2+ DP pulse, neurovascular intact distally  Skin:    General: Skin is warm and dry.     Comments: Abrasion posterior aspect left hand  Neurological:     Mental Status: She is alert and oriented to person, place, and time.  Psychiatric:        Mood and Affect: Mood normal.     ED Results / Procedures / Treatments   Labs (all labs ordered are listed, but only abnormal results are displayed) Labs Reviewed  CBC WITH DIFFERENTIAL/PLATELET - Abnormal; Notable for the following components:      Result Value   WBC 14.8 (*)    Neutro Abs 11.5 (*)    Abs Immature Granulocytes 0.17 (*)     All other components within normal limits  BASIC METABOLIC PANEL - Abnormal; Notable for the following components:   Glucose, Bld 209 (*)    Creatinine, Ser 1.02 (*)    Calcium 8.8 (*)    GFR, Estimated 51 (*)    All other components within normal limits  CBC  BASIC METABOLIC PANEL  HEMOGLOBIN A1C  TYPE AND SCREEN    EKG EKG Interpretation  Date/Time:  Sunday March 11 2023 23:42:55 EDT Ventricular Rate:  72 PR Interval:    QRS Duration: 99 QT Interval:  441 QTC Calculation: 483 R Axis:   91 Text Interpretation: Atrial fibrillation Right axis deviation Borderline T abnormalities, diffuse leads Confirmed by Thayer Jew [id number]) on 03/12/2023 3:47:18 AM  Radiology DG Chest Portable 1 View  Result Date: 03/12/2023 CLINICAL DATA:  Recent fall with known left hip fracture EXAM: PORTABLE CHEST 1 VIEW COMPARISON:  06/02/2015 FINDINGS: Cardiac shadow is enlarged. Aortic calcifications are seen. Mild central vascular congestion is noted without edema. No focal infiltrate is seen. No bony abnormality is noted. IMPRESSION: Mild central vascular congestion. Electronically Signed   By: Inez Catalina M.D.   On: 03/12/2023 00:32   DG Hip Unilat W or Wo Pelvis 2-3 Views Left  Result Date: 03/12/2023 CLINICAL DATA:  Recent trip and fall with left hip pain, initial encounter EXAM: DG HIP (WITH OR WITHOUT PELVIS) 2-V LEFT COMPARISON:  None Available. FINDINGS: Comminuted proximal left intratrochanteric femoral fracture is noted with impaction and angulation at the fracture site. Pelvic ring appears intact. Vascular calcifications are noted. IMPRESSION: Left intratrochanteric femoral fracture. Electronically Signed   By: Inez Catalina M.D.   On: 03/12/2023 00:31   CT Head Wo Contrast  Result Date: 03/12/2023 CLINICAL DATA:  Recent trip and fall with headaches, initial encounter EXAM: CT HEAD WITHOUT CONTRAST TECHNIQUE: Contiguous axial images were obtained from the base of the skull through the  vertex without intravenous contrast. RADIATION DOSE REDUCTION: This exam was performed according to the departmental dose-optimization program which includes automated exposure control, adjustment of the mA and/or kV according to patient size and/or use of iterative reconstruction technique. COMPARISON:  None Available. FINDINGS: Brain: No evidence of acute infarction, hemorrhage, hydrocephalus, extra-axial collection or mass lesion/mass effect. Chronic atrophic and ischemic changes are noted. Vascular: No hyperdense vessel or unexpected calcification. Skull: Normal. Negative for fracture or focal lesion. Sinuses/Orbits: No acute finding. Other: None. IMPRESSION: Chronic atrophic and ischemic changes without acute abnormality. Electronically Signed  By: Inez Catalina M.D.   On: 03/12/2023 00:30    Procedures Procedures    Medications Ordered in ED Medications  metoprolol tartrate (LOPRESSOR) tablet 100 mg (has no administration in time range)  ALPRAZolam (XANAX) tablet 1 mg (has no administration in time range)  famotidine (PEPCID) tablet 20 mg (has no administration in time range)  oxyCODONE (Oxy IR/ROXICODONE) immediate release tablet 2.5 mg (has no administration in time range)  methocarbamol (ROBAXIN) tablet 500 mg (has no administration in time range)  senna-docusate (Senokot-S) tablet 1 tablet (has no administration in time range)  fentaNYL (SUBLIMAZE) injection 12.5-25 mcg (has no administration in time range)  ondansetron (ZOFRAN) injection 4 mg (has no administration in time range)  insulin aspart (novoLOG) injection 0-6 Units (has no administration in time range)  fentaNYL (SUBLIMAZE) injection 50 mcg (50 mcg Intravenous Given 03/12/23 0036)    ED Course/ Medical Decision Making/ A&P Clinical Course as of 03/12/23 0347  Mon Mar 12, 2023  0346 Spoke to Dr. Delfino Lovett, orthopedics.  Patient to remain NPO. [CH]    Clinical Course User Index [CH] Rylan Kaufmann, Barbette Hair, MD                              Medical Decision Making Amount and/or Complexity of Data Reviewed Labs: ordered. Radiology: ordered.  Risk Prescription drug management. Decision regarding hospitalization.   This patient presents to the ED for concern of left hip pain, this involves an extensive number of treatment options, and is a complaint that carries with it a high risk of complications and morbidity.  I considered the following differential and admission for this acute, potentially life threatening condition.  The differential diagnosis includes fracture, dislocation  MDM:    This is a 89 year old female who presents with left hip pain.  Reports mechanical fall.  Is on blood thinners.  Does not believe that she hit her head.  No obvious signs of head trauma.  She is awake, alert, no acute distress.  Vital signs are reassuring.  She has an obvious deformity of the left lower extremity.  She is neurovascular intact.  X-rays show left intratrochanteric hip fracture.  Screening labs and chest x-ray as EKG were obtained.  She is in persistent atrial fibrillation.  Ortho consulted and patient to be admitted to the hospitalist.  (Labs, imaging, consults)  Labs: I Ordered, and personally interpreted labs.  The pertinent results include: BC, BMP  Imaging Studies ordered: I ordered imaging studies including chest x-ray, left hip I independently visualized and interpreted imaging. I agree with the radiologist interpretation  Additional history obtained from son at bedside.  External records from outside source obtained and reviewed including prior evaluations  Cardiac Monitoring: The patient was maintained on a cardiac monitor.  If on the cardiac monitor, I personally viewed and interpreted the cardiac monitored which showed an underlying rhythm of: atrial fibrillation  Reevaluation: After the interventions noted above, I reevaluated the patient and found that they have :improved  Social Determinants of  Health:  lives independently  Disposition: Admit  Co morbidities that complicate the patient evaluation  Past Medical History:  Diagnosis Date   Back pain    Bruises easily    Coronary artery disease    NONOBSTRUCTIVE   Fatigue    Hyperlipidemia    Hypertension    Knee pain    OA (osteoarthritis)    Obesity    Palpitations    SOB (  shortness of breath)      Medicines Meds ordered this encounter  Medications   fentaNYL (SUBLIMAZE) injection 50 mcg   metoprolol tartrate (LOPRESSOR) tablet 100 mg    You may take an additional 1/2 tablet (50 mg total) by mouth daily as needed for palpitations.     ALPRAZolam (XANAX) tablet 1 mg   famotidine (PEPCID) tablet 20 mg   oxyCODONE (Oxy IR/ROXICODONE) immediate release tablet 2.5 mg   methocarbamol (ROBAXIN) tablet 500 mg   senna-docusate (Senokot-S) tablet 1 tablet   fentaNYL (SUBLIMAZE) injection 12.5-25 mcg   ondansetron (ZOFRAN) injection 4 mg   insulin aspart (novoLOG) injection 0-6 Units    Order Specific Question:   Correction coverage:    Answer:   Very Sensitive (ESRD/Dialysis)    Order Specific Question:   CBG < 70:    Answer:   Implement Hypoglycemia Standing Orders and refer to Hypoglycemia Standing Orders sidebar report    Order Specific Question:   CBG 70 - 120:    Answer:   0 units    Order Specific Question:   CBG 121 - 150:    Answer:   0 units    Order Specific Question:   CBG 151 - 200:    Answer:   1 unit    Order Specific Question:   CBG 201-250:    Answer:   2 units    Order Specific Question:   CBG 251-300:    Answer:   3 units    Order Specific Question:   CBG 301-350:    Answer:   4 units    Order Specific Question:   CBG 351-400:    Answer:   5 units    Order Specific Question:   CBG > 400    Answer:   Give 6 units and call MD    I have reviewed the patients home medicines and have made adjustments as needed  Problem List / ED Course: Problem List Items Addressed This Visit   None Visit  Diagnoses     Closed fracture of left hip, initial encounter ([facility])    -  Primary                   Final Clinical Impression(s) / ED Diagnoses Final diagnoses:  Closed fracture of left hip, initial encounter [facility])    Rx / DC Orders ED Discharge Orders     None         Iveth Heidemann, Barbette Hair, MD 03/12/23 [id number]    Merryl Hacker, MD 03/12/23 [phone]

## 2023-03-12 NOTE — Hospital Course (Signed)
Tanya Ho is a 89 y.o. female with hypertension, nonobstructive CAD, chronic heart failure with preserved EF, permanent atrial fibrillation on Eliquis.  Patient presented secondary to a fall without syncope and subsequent left hip pain.  Patient was found to have comminuted impacted fracture of the left proximal femur.  Orthopedic surgery consulted and performed cephalomedullary nail placement on 3/25 with recommendations for weightbearing as tolerated to the left lower extremity.

## 2023-03-12 NOTE — H&P (View-Only) (Signed)
Reason for Consult:Left hip fx Referring Physician: Marylu Lund Time called: [id number] Time at bedside: 0930   Tanya Ho is an 89 y.o. female.  HPI: Tanya Ho was at home when she heard a strange noise outside. She got out of bed to go look but fell before she got there. She's unsure what made her fall. She had immediate pain in her left hip and could not get up. She crawled to the phone and was able to call for help. She was brought to the ED where x-rays showed a hip fx and orthopedic surgery was consulted. She lives at home alone and uses a RW to ambulate.  Past Medical History:  Diagnosis Date   Back pain    Bruises easily    Coronary artery disease    NONOBSTRUCTIVE   Fatigue    Hyperlipidemia    Hypertension    Knee pain    OA (osteoarthritis)    Obesity    Palpitations    SOB (shortness of breath)     Past Surgical History:  Procedure Laterality Date   BREAST BIOPSY     LEFT BREAST   CARDIAC CATHETERIZATION  05/11/2006   OVERALL CARDIAC SIZE AND SILHOUETTE ARE NORMAL. EF ESTIMATED 60%   KIDNEY STONES  1982   KNEE ARTHROSCOPY  09/22/2008   VAGINAL HYSTERECTOMY      Family History  Problem Relation Age of Onset   Heart disease Mother    Stroke Mother    Heart attack Father     Social History:  reports that she has never smoked. She has never used smokeless tobacco. She reports that she does not drink alcohol and does not use drugs.  Allergies:  Allergies  Allergen Reactions   Codeine    Demerol    Morphine And Related     Medications: I have reviewed the patient's current medications.  Results for orders placed or performed during the hospital encounter of 03/11/23 (from the past 48 hour(s))  CBC with Differential     Status: Abnormal   Collection Time: 03/11/23 11:57 PM  Result Value Ref Range   WBC 14.8 (H) 4.0 - 10.5 K/uL   RBC 4.26 3.87 - 5.11 MIL/uL   Hemoglobin 13.3 12.0 - 15.0 g/dL   HCT 39.7 36.0 - 46.0 %   MCV 93.2 80.0 - 100.0 fL   MCH 31.2 26.0  - 34.0 pg   MCHC 33.5 30.0 - 36.0 g/dL   RDW 13.0 11.5 - 15.5 %   Platelets 235 150 - 400 K/uL   nRBC 0.0 0.0 - 0.2 %   Neutrophils Relative % 76 %   Neutro Abs 11.5 (H) 1.7 - 7.7 K/uL   Lymphocytes Relative 15 %   Lymphs Abs 2.2 0.7 - 4.0 K/uL   Monocytes Relative 6 %   Monocytes Absolute 0.8 0.1 - 1.0 K/uL   Eosinophils Relative 1 %   Eosinophils Absolute 0.1 0.0 - 0.5 K/uL   Basophils Relative 1 %   Basophils Absolute 0.1 0.0 - 0.1 K/uL   Immature Granulocytes 1 %   Abs Immature Granulocytes 0.17 (H) 0.00 - 0.07 K/uL    Comment: Performed at [facility], [address], [place], [place] [id number]  Basic metabolic panel     Status: Abnormal   Collection Time: 03/11/23 11:57 PM  Result Value Ref Range   Sodium 136 135 - 145 mmol/L   Potassium 3.8 3.5 - 5.1 mmol/L   Chloride 102 98 - 111  mmol/L   CO2 26 22 - 32 mmol/L   Glucose, Bld 209 (H) 70 - 99 mg/dL    Comment: Glucose reference range applies only to samples taken after fasting for at least 8 hours.   BUN 14 8 - 23 mg/dL   Creatinine, Ser 1.02 (H) 0.44 - 1.00 mg/dL   Calcium 8.8 (L) 8.9 - 10.3 mg/dL   GFR, Estimated 51 (L) >60 mL/min    Comment: (NOTE) Calculated using the CKD-EPI Creatinine Equation (2021)    Anion gap 8 5 - 15    Comment: Performed at [facility] [address], [place], [place] [address]  Type and screen [place]     Status: None   Collection Time: 03/12/23  4:20 AM  Result Value Ref Range   ABO/RH(D) A POS    Antibody Screen NEG    Sample Expiration      03/15/2023,2359 Performed at [facility], [place] [address] [place], [place] [address]   CBG monitoring, ED     Status: Abnormal   Collection Time: 03/12/23  4:22 AM  Result Value Ref Range   Glucose-Capillary 201 (H) 70 - 99 mg/dL    Comment: Glucose reference range applies only to samples taken after fasting for at least 8 hours.  CBC     Status: Abnormal   Collection Time: 03/12/23  4:25 AM   Result Value Ref Range   WBC 16.4 (H) 4.0 - 10.5 K/uL   RBC 4.20 3.87 - 5.11 MIL/uL   Hemoglobin 12.9 12.0 - 15.0 g/dL   HCT 38.9 36.0 - 46.0 %   MCV 92.6 80.0 - 100.0 fL   MCH 30.7 26.0 - 34.0 pg   MCHC 33.2 30.0 - 36.0 g/dL   RDW 13.0 11.5 - 15.5 %   Platelets 228 150 - 400 K/uL   nRBC 0.0 0.0 - 0.2 %    Comment: Performed at [facility], [place] [address], [place], [place] [id number]  Basic metabolic panel     Status: Abnormal   Collection Time: 03/12/23  4:25 AM  Result Value Ref Range   Sodium 136 135 - 145 mmol/L   Potassium 3.4 (L) 3.5 - 5.1 mmol/L   Chloride 103 98 - 111 mmol/L   CO2 25 22 - 32 mmol/L   Glucose, Bld 187 (H) 70 - 99 mg/dL    Comment: Glucose reference range applies only to samples taken after fasting for at least 8 hours.   BUN 14 8 - 23 mg/dL   Creatinine, Ser 0.93 0.44 - 1.00 mg/dL   Calcium 8.8 (L) 8.9 - 10.3 mg/dL   GFR, Estimated 57 (L) >60 mL/min    Comment: (NOTE) Calculated using the CKD-EPI Creatinine Equation (2021)    Anion gap 8 5 - 15    Comment: Performed at [facility] [address], [place], [place] [address]  ABO/Rh     Status: None   Collection Time: 03/12/23  4:25 AM  Result Value Ref Range   ABO/RH(D)      A POS Performed at [facility] [address] [place], [place] [address]   CBG monitoring, ED     Status: Abnormal   Collection Time: 03/12/23  7:41 AM  Result Value Ref Range   Glucose-Capillary 186 (H) 70 - 99 mg/dL    Comment: Glucose reference range applies only to samples taken after fasting for at least 8 hours.  DG FEMUR PORT MIN 2 VIEWS LEFT  Result Date: 03/12/2023 CLINICAL DATA:  Femoral fracture. EXAM: LEFT FEMUR PORTABLE 2 VIEWS COMPARISON:  None Available. FINDINGS: There is a comminuted impacted fracture of the left proximal femur, slightly inferior to the greater trochanter and involving the lesser trochanter. Butterfly fragment is seen medially. The left hip joint is normally located.  IMPRESSION: Comminuted impacted fracture of the left proximal femur, slightly inferior to the greater trochanter and involving the lesser trochanter. Electronically Signed   By: Fidela Salisbury M.D.   On: 03/12/2023 08:23   DG Chest Portable 1 View  Result Date: 03/12/2023 CLINICAL DATA:  Recent fall with known left hip fracture EXAM: PORTABLE CHEST 1 VIEW COMPARISON:  06/02/2015 FINDINGS: Cardiac shadow is enlarged. Aortic calcifications are seen. Mild central vascular congestion is noted without edema. No focal infiltrate is seen. No bony abnormality is noted. IMPRESSION: Mild central vascular congestion. Electronically Signed   By: Inez Catalina M.D.   On: 03/12/2023 00:32   DG Hip Unilat W or Wo Pelvis 2-3 Views Left  Result Date: 03/12/2023 CLINICAL DATA:  Recent trip and fall with left hip pain, initial encounter EXAM: DG HIP (WITH OR WITHOUT PELVIS) 2-V LEFT COMPARISON:  None Available. FINDINGS: Comminuted proximal left intratrochanteric femoral fracture is noted with impaction and angulation at the fracture site. Pelvic ring appears intact. Vascular calcifications are noted. IMPRESSION: Left intratrochanteric femoral fracture. Electronically Signed   By: Inez Catalina M.D.   On: 03/12/2023 00:31   CT Head Wo Contrast  Result Date: 03/12/2023 CLINICAL DATA:  Recent trip and fall with headaches, initial encounter EXAM: CT HEAD WITHOUT CONTRAST TECHNIQUE: Contiguous axial images were obtained from the base of the skull through the vertex without intravenous contrast. RADIATION DOSE REDUCTION: This exam was performed according to the departmental dose-optimization program which includes automated exposure control, adjustment of the mA and/or kV according to patient size and/or use of iterative reconstruction technique. COMPARISON:  None Available. FINDINGS: Brain: No evidence of acute infarction, hemorrhage, hydrocephalus, extra-axial collection or mass lesion/mass effect. Chronic atrophic and  ischemic changes are noted. Vascular: No hyperdense vessel or unexpected calcification. Skull: Normal. Negative for fracture or focal lesion. Sinuses/Orbits: No acute finding. Other: None. IMPRESSION: Chronic atrophic and ischemic changes without acute abnormality. Electronically Signed   By: Inez Catalina M.D.   On: 03/12/2023 00:30    Review of Systems  HENT:  Negative for ear discharge, ear pain, hearing loss and tinnitus.   Eyes:  Negative for photophobia and pain.  Respiratory:  Negative for cough and shortness of breath.   Cardiovascular:  Negative for chest pain.  Gastrointestinal:  Negative for abdominal pain, nausea and vomiting.  Genitourinary:  Negative for dysuria, flank pain, frequency and urgency.  Musculoskeletal:  Positive for arthralgias (Left hip). Negative for back pain, myalgias and neck pain.  Neurological:  Negative for dizziness and headaches.  Hematological:  Does not bruise/bleed easily.  Psychiatric/Behavioral:  The patient is not nervous/anxious.    Blood pressure (!) 160/94, pulse 72, temperature 98.1 F (36.7 C), temperature source Oral, resp. rate 15, height 5\' 6"  (1.676 m), weight 80 kg, SpO2 99 %. Physical Exam Constitutional:      General: She is not in acute distress.    Appearance: She is well-developed. She is not diaphoretic.  HENT:     Head: Normocephalic and atraumatic.  Eyes:     General: No scleral icterus.       Right eye: No discharge.  Left eye: No discharge.     Conjunctiva/sclera: Conjunctivae normal.  Cardiovascular:     Rate and Rhythm: Normal rate and regular rhythm.  Pulmonary:     Effort: Pulmonary effort is normal. No respiratory distress.  Musculoskeletal:     Cervical back: Normal range of motion.     Comments: LLE No traumatic wounds, ecchymosis, or rash  Mod TTP hip  No knee or ankle effusion  Knee stable to varus/ valgus and anterior/posterior stress but mod painful  Sens DPN, SPN, TN intact  Motor EHL, ext, flex,  evers 5/5  DP 2+, PT 2+, No significant edema  Skin:    General: Skin is warm and dry.  Neurological:     Mental Status: She is alert.  Psychiatric:        Mood and Affect: Mood normal.        Behavior: Behavior normal.     Assessment/Plan: Left hip fx -- Plan IMN today with Dr. Doreatha Martin. Please keep NPO. Multiple medical problems including hypertension, nonobstructive CAD, chronic HFpEF, and atrial fibrillation on Eliquis -- per primary service    Lisette Abu, PA-C Orthopedic Surgery [phone] 03/12/2023, 9:51 AM

## 2023-03-12 NOTE — Interval H&P Note (Signed)
History and Physical Interval Note:  03/12/2023 1:07 PM  Tanya Ho  has presented today for surgery, with the diagnosis of LEft intertrochanteric femur fracture.  The various methods of treatment have been discussed with the patient and family. After consideration of risks, benefits and other options for treatment, the patient has consented to  Procedure(s): INTRAMEDULLARY (IM) NAIL INTERTROCHANTERIC (Left) as a surgical intervention.  The patient's history has been reviewed, patient examined, no change in status, stable for surgery.  I have reviewed the patient's chart and labs.  Questions were answered to the patient's satisfaction.     Lennette Bihari P Sofie Schendel

## 2023-03-12 NOTE — ED Notes (Signed)
ED TO INPATIENT HANDOFF REPORT  ED Nurse Name and Phone #: Timmothy Sours Name/Age/Gender Tanya Ho 89 y.o. female Room/Bed: 040C/040C  Code Status   Code Status: Full Code  Home/SNF/Other Home Patient oriented to: self, place, time, and situation Is this baseline? Yes   Triage Complete: Triage complete  Chief Complaint Closed left hip fracture, initial encounter [facility]) [S72.002A]  Triage Note  Patient arrives via ems from home secondary  to a fall. Patient states she was walking to the restroom and tripped and fell. No head strike, patient c/o lt hip pain, patient takes eliquis. No rotation noted, c collar placed.   Allergies Allergies  Allergen Reactions   Codeine    Demerol    Morphine And Related     Level of Care/Admitting Diagnosis ED Disposition     ED Disposition  Admit   Condition  --   [place]: [facility] [[id number]]  Level of Care: Med-Surg [16]  May admit patient to [facility] or [facility] if equivalent level of care is available:: No  Covid Evaluation: Asymptomatic - no recent exposure (last 10 days) testing not required  Diagnosis: [facility] hip [id number]  Admitting Physician: Vianne Bulls [id number]  Attending Physician: Vianne Bulls [id number]  Certification:: I certify this patient will need inpatient services for at least 2 midnights  Estimated Length of Stay: 3          B Medical/Surgery History Past Medical History:  Diagnosis Date   Back pain    Bruises easily    Coronary artery disease    NONOBSTRUCTIVE   Fatigue    Hyperlipidemia    Hypertension    Knee pain    OA (osteoarthritis)    Obesity    Palpitations    SOB (shortness of breath)    Past Surgical History:  Procedure Laterality Date   BREAST BIOPSY     LEFT BREAST   CARDIAC CATHETERIZATION  05/11/2006   OVERALL CARDIAC SIZE AND SILHOUETTE ARE NORMAL. EF ESTIMATED 60%   KIDNEY STONES   1982   KNEE ARTHROSCOPY  09/22/2008   VAGINAL HYSTERECTOMY       A IV Location/Drains/Wounds Patient Lines/Drains/Airways Status     Active Line/Drains/Airways     Name Placement date Placement time Site Days   Peripheral IV 03/12/23 18 G Anterior;Proximal;Right Forearm 03/12/23  0015  Forearm  less than 1            Intake/Output Last 24 hours No intake or output data in the 24 hours ending 03/12/23 0724  Labs/Imaging Results for orders placed or performed during the hospital encounter of 03/11/23 (from the past 48 hour(s))  CBC with Differential     Status: Abnormal   Collection Time: 03/11/23 11:57 PM  Result Value Ref Range   WBC 14.8 (H) 4.0 - 10.5 K/uL   RBC 4.26 3.87 - 5.11 MIL/uL   Hemoglobin 13.3 12.0 - 15.0 g/dL   HCT 39.7 36.0 - 46.0 %   MCV 93.2 80.0 - 100.0 fL   MCH 31.2 26.0 - 34.0 pg   MCHC 33.5 30.0 - 36.0 g/dL   RDW 13.0 11.5 - 15.5 %   Platelets 235 150 - 400 K/uL   nRBC 0.0 0.0 - 0.2 %   Neutrophils Relative % 76 %   Neutro Abs 11.5 (H) 1.7 - 7.7 K/uL   Lymphocytes Relative 15 %   Lymphs Abs 2.2 0.7 -  4.0 K/uL   Monocytes Relative 6 %   Monocytes Absolute 0.8 0.1 - 1.0 K/uL   Eosinophils Relative 1 %   Eosinophils Absolute 0.1 0.0 - 0.5 K/uL   Basophils Relative 1 %   Basophils Absolute 0.1 0.0 - 0.1 K/uL   Immature Granulocytes 1 %   Abs Immature Granulocytes 0.17 (H) 0.00 - 0.07 K/uL    Comment: Performed at [facility] [address], [place], [place] [id number]  Basic metabolic panel     Status: Abnormal   Collection Time: 03/11/23 11:57 PM  Result Value Ref Range   Sodium 136 135 - 145 mmol/L   Potassium 3.8 3.5 - 5.1 mmol/L   Chloride 102 98 - 111 mmol/L   CO2 26 22 - 32 mmol/L   Glucose, Bld 209 (H) 70 - 99 mg/dL    Comment: Glucose reference range applies only to samples taken after fasting for at least 8 hours.   BUN 14 8 - 23 mg/dL   Creatinine, Ser 1.02 (H) 0.44 - 1.00 mg/dL   Calcium 8.8 (L) 8.9 - 10.3 mg/dL   GFR,  Estimated 51 (L) >60 mL/min    Comment: (NOTE) Calculated using the CKD-EPI Creatinine Equation (2021)    Anion gap 8 5 - 15    Comment: Performed at [facility] [address], [place], [place] [address]  Type and screen [place]     Status: None   Collection Time: 03/12/23  4:20 AM  Result Value Ref Range   ABO/RH(D) A POS    Antibody Screen NEG    Sample Expiration      03/15/2023,2359 Performed at [facility], [place] [address], [place], [place] [address]   CBC     Status: Abnormal   Collection Time: 03/12/23  4:25 AM  Result Value Ref Range   WBC 16.4 (H) 4.0 - 10.5 K/uL   RBC 4.20 3.87 - 5.11 MIL/uL   Hemoglobin 12.9 12.0 - 15.0 g/dL   HCT 38.9 36.0 - 46.0 %   MCV 92.6 80.0 - 100.0 fL   MCH 30.7 26.0 - 34.0 pg   MCHC 33.2 30.0 - 36.0 g/dL   RDW 13.0 11.5 - 15.5 %   Platelets 228 150 - 400 K/uL   nRBC 0.0 0.0 - 0.2 %    Comment: Performed at [facility], [place] [address] [place], [place] [id number]  Basic metabolic panel     Status: Abnormal   Collection Time: 03/12/23  4:25 AM  Result Value Ref Range   Sodium 136 135 - 145 mmol/L   Potassium 3.4 (L) 3.5 - 5.1 mmol/L   Chloride 103 98 - 111 mmol/L   CO2 25 22 - 32 mmol/L   Glucose, Bld 187 (H) 70 - 99 mg/dL    Comment: Glucose reference range applies only to samples taken after fasting for at least 8 hours.   BUN 14 8 - 23 mg/dL   Creatinine, Ser 0.93 0.44 - 1.00 mg/dL   Calcium 8.8 (L) 8.9 - 10.3 mg/dL   GFR, Estimated 57 (L) >60 mL/min    Comment: (NOTE) Calculated using the CKD-EPI Creatinine Equation (2021)    Anion gap 8 5 - 15    Comment: Performed at [facility] [address], [place], [place] [address]  ABO/Rh     Status: None   Collection Time: 03/12/23  4:25 AM  Result Value Ref Range  ABO/RH(D)      A POS Performed at [facility], [place] [address], [place], [place] [address]    DG Chest Portable 1 View  Result Date: 03/12/2023 CLINICAL DATA:   Recent fall with known left hip fracture EXAM: PORTABLE CHEST 1 VIEW COMPARISON:  06/02/2015 FINDINGS: Cardiac shadow is enlarged. Aortic calcifications are seen. Mild central vascular congestion is noted without edema. No focal infiltrate is seen. No bony abnormality is noted. IMPRESSION: Mild central vascular congestion. Electronically Signed   By: Inez Catalina M.D.   On: 03/12/2023 00:32   DG Hip Unilat W or Wo Pelvis 2-3 Views Left  Result Date: 03/12/2023 CLINICAL DATA:  Recent trip and fall with left hip pain, initial encounter EXAM: DG HIP (WITH OR WITHOUT PELVIS) 2-V LEFT COMPARISON:  None Available. FINDINGS: Comminuted proximal left intratrochanteric femoral fracture is noted with impaction and angulation at the fracture site. Pelvic ring appears intact. Vascular calcifications are noted. IMPRESSION: Left intratrochanteric femoral fracture. Electronically Signed   By: Inez Catalina M.D.   On: 03/12/2023 00:31   CT Head Wo Contrast  Result Date: 03/12/2023 CLINICAL DATA:  Recent trip and fall with headaches, initial encounter EXAM: CT HEAD WITHOUT CONTRAST TECHNIQUE: Contiguous axial images were obtained from the base of the skull through the vertex without intravenous contrast. RADIATION DOSE REDUCTION: This exam was performed according to the departmental dose-optimization program which includes automated exposure control, adjustment of the mA and/or kV according to patient size and/or use of iterative reconstruction technique. COMPARISON:  None Available. FINDINGS: Brain: No evidence of acute infarction, hemorrhage, hydrocephalus, extra-axial collection or mass lesion/mass effect. Chronic atrophic and ischemic changes are noted. Vascular: No hyperdense vessel or unexpected calcification. Skull: Normal. Negative for fracture or focal lesion. Sinuses/Orbits: No acute finding. Other: None. IMPRESSION: Chronic atrophic and ischemic changes without acute abnormality. Electronically Signed   By: Inez Catalina M.D.   On: 03/12/2023 00:30    Pending Labs Unresulted Labs (From admission, onward)     Start     Ordered   03/12/23 0500  CBC  Daily,   R      03/12/23 0332   03/12/23 [id number]  Basic metabolic panel  Daily,   R      03/12/23 0332   03/12/23 0500  Hemoglobin A1c  Tomorrow morning,   R       Comments: To assess prior glycemic control    03/12/23 0335            Vitals/Pain Today's Vitals   03/12/23 0515 03/12/23 0530 03/12/23 0545 03/12/23 0600  BP: (!) 135/122 (!) 115/90 127/77 120/68  Pulse: 71 77 70 70  Resp: 13 16 17 16   Temp:      TempSrc:      SpO2: 99% 98% 100% 96%  Weight:      Height:      PainSc:        Isolation Precautions No active isolations  Medications Medications  metoprolol tartrate (LOPRESSOR) tablet 100 mg (100 mg Oral Given 03/12/23 0413)  ALPRAZolam (XANAX) tablet 1 mg (has no administration in time range)  famotidine (PEPCID) tablet 20 mg (has no administration in time range)  oxyCODONE (Oxy IR/ROXICODONE) immediate release tablet 2.5 mg (2.5 mg Oral Given 03/12/23 0601)  methocarbamol (ROBAXIN) tablet 500 mg (has no administration in time range)  senna-docusate (Senokot-S) tablet 1 tablet (has no administration in time range)  fentaNYL (SUBLIMAZE) injection 12.5-25 mcg (25 mcg Intravenous Given 03/12/23 0413)  ondansetron [facility]) injection 4 mg (4 mg Intravenous Given 03/12/23 0413)  insulin aspart (novoLOG) injection 0-6 Units (2 Units Subcutaneous Given 03/12/23 0433)  fentaNYL (SUBLIMAZE) injection 50 mcg (50 mcg Intravenous Given 03/12/23 0036)    Mobility non-ambulatory     Focused Assessments    R Recommendations: See Admitting Provider Note  Report given to:   Additional Notes:

## 2023-03-12 NOTE — Progress Notes (Signed)
4 eyes complete with [facility] RN pt skin intact except for skin tear to left hand.

## 2023-03-12 NOTE — Transfer of Care (Signed)
Immediate Anesthesia Transfer of Care Note  Patient: Tanya Ho  Procedure(s) Performed: INTRAMEDULLARY (IM) NAIL INTERTROCHANTERIC (Left)  Patient Location: PACU  Anesthesia Type:General  Level of Consciousness: drowsy and patient cooperative  Airway & Oxygen Therapy: Patient Spontanous Breathing and Patient connected to nasal cannula oxygen  Post-op Assessment: Report given to RN and Post -op Vital signs reviewed and stable  Post vital signs: Reviewed and stable  Last Vitals:  Vitals Value Taken Time  BP 174/74 03/12/23 1512  Temp    Pulse 74 03/12/23 1515  Resp 17 03/12/23 1515  SpO2 99 % 03/12/23 1515  Vitals shown include unvalidated device data.  Last Pain:  Vitals:   03/12/23 1220  TempSrc: Oral  PainSc:       Patients Stated Pain Goal: 0 ([id number])  Complications: No notable events documented.

## 2023-03-12 NOTE — Op Note (Signed)
Orthopaedic Surgery Operative Note (CSN: [id number] ) Date of Surgery: 03/12/2023  Admit Date: 03/11/2023   Diagnoses: Pre-Op Diagnoses: Left intertrochanteric femur fracture  Post-Op Diagnosis: Same  Procedures: CPT 27245-Cephalomedullary nailing of left intertrochanteric femur fracture   Surgeons : Primary: Shona Needles, MD  Assistant: Patrecia Pace, PA-C  Location: OR 3   Anesthesia: General   Antibiotics: Ancef 2g preop   Tourniquet time: None    Estimated Blood Loss: 20 mL  Complications:None   Specimens:None  Implants: Implant Name Type Inv. Item Serial No. Manufacturer Lot No. LRB No. Used Action  NAIL LOCK CANN 10X380 130D LT - [id number] Nail NAIL LOCK CANN 10X380 130D LT  [employer] [id number] Left 1 Implanted  SCREW LAG COMPR KIT 100/95 - [id number] Screw SCREW LAG COMPR KIT 100/95  [employer] [id number] Left 1 Implanted  SCREW TRIGEN LOW PROF 5.0X42.5 - [id number] Screw SCREW TRIGEN LOW PROF 5.0X42.5  [employer] [id number] Left 1 Implanted     Indications for Surgery: 89 year old female who had a ground-level fall and a left intertrochanteric femur fracture.  Due to the unstable nature of her injury I recommended proceeding with cephalomedullary nailing of the left hip.  Risk and benefits were discussed with the patient.  Risks included but not limited to bleeding, infection, malunion, nonunion, hardware failure, hardware irritation, nerve or blood vessel injury, DVT, even the possibility anesthetic complications.  Both her and her daughter agreed to proceed with surgery and consent was obtained.  Operative Findings: Cephalomedullary nailing of the left intertrochanteric femur fracture using [employer] InterTAN 10 x 380 mm nail with a 100 mm lag screw and 95 mm compression screw.  Procedure: The patient was identified in the preoperative holding area. Consent was confirmed with the patient and their  family and all questions were answered. The operative extremity was marked after confirmation with the patient. she was then brought back to the operating room by our anesthesia colleagues.  She was placed under general anesthetic and carefully transferred over to the [facility] table.  All bony prominences were well-padded.  Fluoroscopic imaging was obtained and alignment was obtained using traction to the left lower extremity.  The left lower extremity was then prepped and draped in usual sterile fashion.  A timeout was performed to verify the patient, the procedure, and the extremity.  Preoperative antibiotics were dosed.  A small incision proximal to the great trochanter was made and carried down through skin and subcutaneous tissue.  A threaded guidewire was placed at the tip of the greater trochanter and advanced into the proximal metaphysis.  I then used an entry reamer to enter the medullary canal.  I then passed a ball-tipped guidewire down the center of the canal and seated it into the distal metaphysis.  I then measured the length and chose to use a 380 mm nail.  I then placed a 10 mm reamer down the center of the canal and did not obtain any chatter and decided to place a 10 mm nail.  The 10 mm nail was attached to the targeting arm and placed down the center of the canal and seated and into the wound it was appropriately positioned.  I used the targeting arm to direct a threaded guidewire into the head/neck segment.  I confirmed adequate tip apex distance.  I measured the length and drilled path for the compression screw and placed an antirotation bar.  I then drilled the path for the lag  screw.  I placed the lag screw and then compressed approximately 5 mm through the nail.  I statically locked the proximal portion of the nail.  I then removed the targeting arm and obtained perfect circle technique to place a lateral to medial distal interlocking screw.  After the screw was placed final fluoroscopic  imaging was obtained.  The incisions were copiously irrigated.  A layered closure of 2-0 Vicryl and Dermabond was used to close the skin.  Sterile dressings were applied.  The patient was then awoke from anesthesia and taken to the PACU in stable condition.  Post Op Plan/Instructions: Patient will be weightbearing as tolerated to the left lower extremity.  She will receive postoperative Ancef.  She may be restarted on her Eliquis once her hemoglobin postoperatively is stable starting postoperative day 1.  We will have her mobilize with physical and Occupational Therapy.  I was present and performed the entire surgery.  Patrecia Pace, PA-C did assist me throughout the case. An assistant was necessary given the difficulty in approach, maintenance of reduction and ability to instrument the fracture.   Katha Hamming, MD [facility]

## 2023-03-12 NOTE — H&P (Signed)
History and Physical    SALIMA GEBHARD [id number] DOB: 08-29-1926 DOA: 03/11/2023  PCP: Shon Baton, MD   Patient coming from: Home   Chief Complaint: Left hip pain after fall   HPI: KEAGHAN RUMSEY is a pleasant 89 y.o. female with medical history significant for hypertension, nonobstructive CAD, chronic HFpEF, and atrial fibrillation on Eliquis who presents emergency department with left hip pain after fall at home.  Patient was in her usual state of health and had an uneventful day yesterday until she fell last night and was unable to get up due to severe left hip pain with any movement.  She was getting ready for bed when she heard a noise outside, turned with her walker does look out the window, but ended up tripping and falling.  She denies hitting her head or losing consciousness.  She was able to scoot along the floor and contact her son and EMS.  She last took her Eliquis last night.  She lives alone, ambulates with a walker about her house, occasionally wakes in the middle the night with chest discomfort, but denies experiencing chest pain with exertion.  ED Course: Upon arrival to the ED, patient is found to be afebrile and saturating well on room air with normal heart rate and elevated blood pressure.  EKG demonstrates atrial fibrillation and head CT is negative for acute findings.  Chest x-ray with mild central vascular congestion.  Plain radiographs of the left hip and pelvis demonstrate left intertrochanteric femur fracture.  Labs are notable for glucose 209, WBC 14,800, and creatinine 1.02.  Hepatic surgery was consulted by the ED physician and patient was treated with fentanyl in the ED.  Review of Systems:  All other systems reviewed and apart from HPI, are negative.  Past Medical History:  Diagnosis Date   Back pain    Bruises easily    Coronary artery disease    NONOBSTRUCTIVE   Fatigue    Hyperlipidemia    Hypertension    Knee pain    OA (osteoarthritis)    Obesity     Palpitations    SOB (shortness of breath)     Past Surgical History:  Procedure Laterality Date   BREAST BIOPSY     LEFT BREAST   CARDIAC CATHETERIZATION  05/11/2006   OVERALL CARDIAC SIZE AND SILHOUETTE ARE NORMAL. EF ESTIMATED 60%   KIDNEY STONES  1982   KNEE ARTHROSCOPY  09/22/2008   VAGINAL HYSTERECTOMY      Social History:   reports that she has never smoked. She has never used smokeless tobacco. She reports that she does not drink alcohol and does not use drugs.  Allergies  Allergen Reactions   Codeine    Demerol    Morphine And Related     Family History  Problem Relation Age of Onset   Heart disease Mother    Stroke Mother    Heart attack Father      Prior to Admission medications   Medication Sig Start Date End Date Taking? Authorizing Provider  ALPRAZolam Duanne Moron) 1 MG tablet Take 1 mg by mouth at bedtime as needed. 03/20/18   [provider]  amLODipine (NORVASC) 2.5 MG tablet 2.5 mg daily. 08/29/20   [provider]  apixaban (ELIQUIS) 5 MG TABS tablet Take 1 tablet (5 mg total) by mouth 2 (two) times daily. 12/19/22   Freada Bergeron, MD  ergocalciferol (VITAMIN D2) 50000 UNITS capsule Take 50,000 Units by mouth once a week.  [provider]  famotidine (PEPCID) 20 MG tablet 1 tab Orally twice daily    [provider]  furosemide (LASIX) 20 MG tablet TAKE 1 TABLET EVERY DAY 10/24/22   Freada Bergeron, MD  metoprolol tartrate (LOPRESSOR) 100 MG tablet Take 1 tablet (100 mg total) by mouth 2 (two) times daily. You may take an additional 1/2 tablet (50 mg total) by mouth daily as needed for palpitations. 02/01/23   Freada Bergeron, MD  nitroGLYCERIN (NITROSTAT) 0.4 MG SL tablet Place 1 tablet (0.4 mg total) under the tongue every 5 (five) minutes as needed for chest pain. 09/08/20   Burtis Junes, NP  traMADol (ULTRAM) 50 MG tablet TAKE 1-2 TABLETS BY MOUTH EVERY 6-8 HOURS AS NEEDED FOR PAIN 03/22/15   [provider]    Physical Exam: Vitals:   03/12/23 0115 03/12/23 0130 03/12/23 0145 03/12/23 0200  BP: (!) 153/90 (!) 161/96 (!) 158/84 (!) 155/94  Pulse: 63 63 61 68  Resp: 18 18 (!) 24 15  Temp:      TempSrc:      SpO2: 97% 98% 98% 99%  Weight:      Height:         Constitutional: NAD, calm  Eyes: PERTLA, lids and conjunctivae normal ENMT: Mucous membranes are moist. Posterior pharynx clear of any exudate or lesions.   Neck: supple, no masses  Respiratory:  no wheezing, no crackles. No accessory muscle use.  Cardiovascular: S1 & S2 heard, regular rate and rhythm. No JVD. Abdomen: No distension, no tenderness, soft. Bowel sounds active.  Musculoskeletal: no clubbing / cyanosis. Left hip tender and deformed, neurovascularly intact.   Skin: no significant rashes, lesions, ulcers. Warm, dry, well-perfused. Neurologic: CN 2-12 grossly intact. Moving all extremities. Alert and oriented to person, place, and situation.  Psychiatric: Pleasant. Cooperative.    Labs and Imaging on Admission: I have personally reviewed following labs and imaging studies  CBC: Recent Labs  Lab 03/11/23 2357  WBC 14.8*  NEUTROABS 11.5*  HGB 13.3  HCT 39.7  MCV 93.2  PLT [id number]   Basic Metabolic Panel: Recent Labs  Lab 03/11/23 2357  NA 136  K 3.8  CL 102  CO2 26  GLUCOSE 209*  BUN 14  CREATININE 1.02*  CALCIUM 8.8*   GFR: Estimated Creatinine Clearance: 35.2 mL/min (A) (by C-G formula based on SCr of 1.02 mg/dL (H)). Liver Function Tests: No results for input(s): "AST", "ALT", "ALKPHOS", "BILITOT", "PROT", "ALBUMIN" in the last 168 hours. No results for input(s): "LIPASE", "AMYLASE" in the last 168 hours. No results for input(s): "AMMONIA" in the last 168 hours. Coagulation Profile: No results for input(s): "INR", "PROTIME" in the last 168 hours. Cardiac Enzymes: No results for input(s): "CKTOTAL", "CKMB", "CKMBINDEX", "TROPONINI" in the last 168 hours. BNP (last 3 results) No  results for input(s): "PROBNP" in the last 8760 hours. HbA1C: No results for input(s): "HGBA1C" in the last 72 hours. CBG: No results for input(s): "GLUCAP" in the last 168 hours. Lipid Profile: No results for input(s): "CHOL", "HDL", "LDLCALC", "TRIG", "CHOLHDL", "LDLDIRECT" in the last 72 hours. Thyroid Function Tests: No results for input(s): "TSH", "T4TOTAL", "FREET4", "T3FREE", "THYROIDAB" in the last 72 hours. Anemia Panel: No results for input(s): "VITAMINB12", "FOLATE", "FERRITIN", "TIBC", "IRON", "RETICCTPCT" in the last 72 hours. Urine analysis: No results found for: "COLORURINE", "APPEARANCEUR", "LABSPEC", "PHURINE", "GLUCOSEU", "HGBUR", "BILIRUBINUR", "KETONESUR", "PROTEINUR", "UROBILINOGEN", "NITRITE", "LEUKOCYTESUR" Sepsis Labs: @LABRCNTIP (procalcitonin:4,lacticidven:4) )No results found for this or any previous visit (from the  past 240 hour(s)).   Radiological Exams on Admission: DG Chest Portable 1 View  Result Date: 03/12/2023 CLINICAL DATA:  Recent fall with known left hip fracture EXAM: PORTABLE CHEST 1 VIEW COMPARISON:  06/02/2015 FINDINGS: Cardiac shadow is enlarged. Aortic calcifications are seen. Mild central vascular congestion is noted without edema. No focal infiltrate is seen. No bony abnormality is noted. IMPRESSION: Mild central vascular congestion. Electronically Signed   By: Inez Catalina M.D.   On: 03/12/2023 00:32   DG Hip Unilat W or Wo Pelvis 2-3 Views Left  Result Date: 03/12/2023 CLINICAL DATA:  Recent trip and fall with left hip pain, initial encounter EXAM: DG HIP (WITH OR WITHOUT PELVIS) 2-V LEFT COMPARISON:  None Available. FINDINGS: Comminuted proximal left intratrochanteric femoral fracture is noted with impaction and angulation at the fracture site. Pelvic ring appears intact. Vascular calcifications are noted. IMPRESSION: Left intratrochanteric femoral fracture. Electronically Signed   By: Inez Catalina M.D.   On: 03/12/2023 00:31   CT Head Wo  Contrast  Result Date: 03/12/2023 CLINICAL DATA:  Recent trip and fall with headaches, initial encounter EXAM: CT HEAD WITHOUT CONTRAST TECHNIQUE: Contiguous axial images were obtained from the base of the skull through the vertex without intravenous contrast. RADIATION DOSE REDUCTION: This exam was performed according to the departmental dose-optimization program which includes automated exposure control, adjustment of the mA and/or kV according to patient size and/or use of iterative reconstruction technique. COMPARISON:  None Available. FINDINGS: Brain: No evidence of acute infarction, hemorrhage, hydrocephalus, extra-axial collection or mass lesion/mass effect. Chronic atrophic and ischemic changes are noted. Vascular: No hyperdense vessel or unexpected calcification. Skull: Normal. Negative for fracture or focal lesion. Sinuses/Orbits: No acute finding. Other: None. IMPRESSION: Chronic atrophic and ischemic changes without acute abnormality. Electronically Signed   By: Inez Catalina M.D.   On: 03/12/2023 00:30    EKG: Independently reviewed. Atrial fibrillation.   Assessment/Plan   1. Left hip fracture - Based on the available data, Mrs. Talon presents an estimated 1% risk of perioperative MI or cardiac arrest  - Hold Eliquis (last dose PM of 03/11/23), continue pain-control and supportive care, use delirium precautions    2. Atrial fibrillation   - Hold Eliquis perioperatively, continue metoprolol   3. Hypertensive urgency  - SBP 180s in ED in setting of severe pain  - Continue metoprolol, continue pain-control, use IV labetalol as needed    4. Chronic HFpEF  - Appears compensated   - Continue beta-blocker, monitor I/Os and weight, hold diuretic while NPO    5. CAD  - Hx of non-obstructive CAD  - No exertional chest pain  - Continue beta-blocker   6. Hyperglycemia  - Serum glucose is 209 in ED  - Likely stress reaction vs new DM  - Check A1c, check CBGs, use low-intensity SSI if  needed   7. CKD IIIa  - Renally-dose medications, monitor     DVT prophylaxis: SCDs   Code Status: Full  Level of Care: Level of care: Med-Surg Family Communication: None present   Disposition Plan:  Patient is from: Home   Anticipated d/c is to: TBD Anticipated d/c date is: 03/16/23  Patient currently: Pending orthopedic surgery consultation and likely operative hip repair  Consults called: orthopedic surgery  Admission status: Inpatient     Vianne Bulls, MD [facility]  03/12/2023, 3:35 AM
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
## 2023-03-13 ENCOUNTER — Encounter (HOSPITAL_COMMUNITY): Payer: Self-pay | Admitting: Student

## 2023-03-13 DIAGNOSIS — S72002A Fracture of unspecified part of neck of left femur, initial encounter for closed fracture: Secondary | ICD-10-CM | POA: Diagnosis not present

## 2023-03-13 LAB — GLUCOSE, CAPILLARY
Glucose-Capillary: 176 mg/dL — ABNORMAL HIGH (ref 70–99)
Glucose-Capillary: 159 mg/dL — ABNORMAL HIGH (ref 70–99)
Glucose-Capillary: 159 mg/dL — ABNORMAL HIGH (ref 70–99)

## 2023-03-13 LAB — CBC
HCT: 31.7 % — ABNORMAL LOW (ref 36.0–46.0)
Hemoglobin: 10.3 g/dL — ABNORMAL LOW (ref 12.0–15.0)
MCH: 30.6 pg (ref 26.0–34.0)
MCHC: 32.5 g/dL (ref 30.0–36.0)
MCV: 94.1 fL (ref 80.0–100.0)
Platelets: 232 10*3/uL (ref 150–400)
RBC: 3.37 MIL/uL — ABNORMAL LOW (ref 3.87–5.11)
RDW: 13.2 % (ref 11.5–15.5)
WBC: 14.5 10*3/uL — ABNORMAL HIGH (ref 4.0–10.5)
nRBC: 0 % (ref 0.0–0.2)

## 2023-03-13 LAB — COMPREHENSIVE METABOLIC PANEL WITH GFR
ALT: 13 U/L (ref 0–44)
AST: 29 U/L (ref 15–41)
Albumin: 2.8 g/dL — ABNORMAL LOW (ref 3.5–5.0)
Alkaline Phosphatase: 40 U/L (ref 38–126)
Anion gap: 14 (ref 5–15)
BUN: 20 mg/dL (ref 8–23)
CO2: 24 mmol/L (ref 22–32)
Calcium: 8.7 mg/dL — ABNORMAL LOW (ref 8.9–10.3)
Chloride: 96 mmol/L — ABNORMAL LOW (ref 98–111)
Creatinine, Ser: 1.25 mg/dL — ABNORMAL HIGH (ref 0.44–1.00)
GFR, Estimated: 40 mL/min — ABNORMAL LOW
Glucose, Bld: 161 mg/dL — ABNORMAL HIGH (ref 70–99)
Potassium: 3.9 mmol/L (ref 3.5–5.1)
Sodium: 134 mmol/L — ABNORMAL LOW (ref 135–145)
Total Bilirubin: 0.4 mg/dL (ref 0.3–1.2)
Total Protein: 5.8 g/dL — ABNORMAL LOW (ref 6.5–8.1)

## 2023-03-13 LAB — HEMOGLOBIN A1C
Hgb A1c MFr Bld: 6.9 % — ABNORMAL HIGH (ref 4.8–5.6)
Mean Plasma Glucose: 151 mg/dL

## 2023-03-13 LAB — VITAMIN D 25 HYDROXY (VIT D DEFICIENCY, FRACTURES): Vit D, 25-Hydroxy: 99.62 ng/mL (ref 30–100)

## 2023-03-13 MED ORDER — APIXABAN 5 MG PO TABS
5.0000 mg | ORAL_TABLET | Freq: Two times a day (BID) | ORAL | Status: DC
  Administered 2023-03-13 – 2023-03-15 (×6): 5 mg via ORAL
  Filled 2023-03-13 (×6): qty 1

## 2023-03-13 MED ORDER — ACETAMINOPHEN 325 MG PO TABS: 325.0000 mg | ORAL_TABLET | Freq: Four times a day (QID) | ORAL | Status: DC | PRN

## 2023-03-13 MED ORDER — ACETAMINOPHEN 325 MG PO TABS
325.0000 mg | ORAL_TABLET | Freq: Four times a day (QID) | ORAL | Status: DC | PRN
  Filled 2023-03-13: qty 2

## 2023-03-13 NOTE — NC FL2 (Signed)
[place] LEVEL OF CARE FORM     IDENTIFICATION  Patient Name: Tanya Ho Birthdate: 05/20/1926 Sex: female Admission Date (Current Location): 03/11/2023  [facility] and [place] Number:  Herbalist and Address:  [facility] [place] [place] [address] [place], [place] [address]      Provider Number: [id number]  Attending Physician Name and Address:  Donne Hazel, MD  Relative Name and Phone Number:  Jamse Belfast Daughter [phone]    Current Level of Care: Hospital Recommended Level of Care: [place] Prior Approval Number:    Date Approved/Denied:   PASRR Number: [id number]  Discharge Plan: SNF    Current Diagnoses: Patient Active Problem List   Diagnosis Date Noted   Closed left hip fracture, initial encounter ([place]) 03/12/2023   Permanent atrial fibrillation ([place]) 03/12/2023   Stage 3a chronic kidney disease (CKD) ([place]) 03/12/2023   Chronic heart failure with preserved ejection fraction (HFpEF) ([place]) 03/12/2023   Hyperglycemia 03/12/2023   SOB (shortness of breath)    Coronary artery disease    Palpitations    Fatigue    Back pain    Knee pain    Hyperlipidemia    Bruises easily    OA (osteoarthritis)    Obesity    Hypertension     Orientation RESPIRATION BLADDER Height & Weight     Self, Time, Situation, Place  O2 Continent Weight: 176 lb 5.9 oz (80 kg) Height:  5\' 6"  (167.6 cm)  BEHAVIORAL SYMPTOMS/MOOD NEUROLOGICAL BOWEL NUTRITION STATUS      Continent Diet (see discharge summary)  AMBULATORY STATUS COMMUNICATION OF NEEDS Skin   Total Care Verbally Surgical wounds, Other (Comment) (ecchymosis)                       Personal Care Assistance Level of Assistance  Bathing, Feeding, Dressing, Total care Bathing Assistance: Maximum assistance Feeding assistance: Limited assistance Dressing Assistance: Maximum assistance Total Care Assistance: Maximum assistance   Functional Limitations  Info  Sight, Hearing, Speech Sight Info: Adequate Hearing Info: Impaired Speech Info: Adequate    SPECIAL CARE FACTORS FREQUENCY  PT (By licensed PT), OT (By licensed OT)     PT Frequency: 5x week OT Frequency: 5x week            Contractures Contractures Info: Not present    Additional Factors Info  Code Status, Allergies, Insulin Sliding Scale Code Status Info: full Allergies Info: Codeine, Demerol, Morphine And Related   Insulin Sliding Scale Info: Novolog: see discharge summary       Current Medications (03/13/2023):  This is the current hospital active medication list Current Facility-Administered Medications  Medication Dose Route Frequency Provider Last Rate Last Admin   acetaminophen (TYLENOL) tablet 325-650 mg  325-650 mg Oral Q6H PRN Corinne Ports, PA-C       ALPRAZolam Duanne Moron) tablet 1 mg  1 mg Oral QHS PRN Corinne Ports, PA-C   1 mg at 03/12/23 2119   apixaban (ELIQUIS) tablet 5 mg  5 mg Oral BID Dimple Nanas, RPH   5 mg at 03/13/23 1029   diphenhydrAMINE (BENADRYL) 12.5 MG/5ML elixir 12.5-25 mg  12.5-25 mg Oral Q4H PRN Corinne Ports, PA-C       docusate sodium (COLACE) capsule 100 mg  100 mg Oral BID Rushie Nyhan A, PA-C   100 mg at 03/13/23 1029   famotidine (PEPCID) tablet 20 mg  20 mg Oral Daily Corinne Ports,  PA-C   20 mg at 03/13/23 1029   fentaNYL (SUBLIMAZE) injection 12.5-25 mcg  12.5-25 mcg Intravenous Q2H PRN Corinne Ports, PA-C   25 mcg at 03/13/23 0545   haloperidol lactate (HALDOL) injection 2 mg  2 mg Intravenous Q6H PRN Donne Hazel, MD       insulin aspart (novoLOG) injection 0-15 Units  0-15 Units Subcutaneous TID WC Donne Hazel, MD       insulin aspart (novoLOG) injection 0-5 Units  0-5 Units Subcutaneous QHS Donne Hazel, MD   2 Units at 03/12/23 2133   lactated ringers infusion   Intravenous Continuous Corinne Ports, PA-C 10 mL/hr at 03/12/23 1352 Restarted at 03/12/23 1508   methocarbamol (ROBAXIN) tablet  500 mg  500 mg Oral Q6H PRN Corinne Ports, PA-C       metoCLOPramide (REGLAN) tablet 5-10 mg  5-10 mg Oral Q8H PRN Corinne Ports, PA-C       Or   metoCLOPramide (REGLAN) injection 5-10 mg  5-10 mg Intravenous Q8H PRN Corinne Ports, PA-C       metoprolol tartrate (LOPRESSOR) tablet 100 mg  100 mg Oral BID Rushie Nyhan A, PA-C   100 mg at 03/13/23 1028   ondansetron (ZOFRAN) tablet 4 mg  4 mg Oral Q6H PRN Corinne Ports, PA-C       Or   ondansetron (ZOFRAN) injection 4 mg  4 mg Intravenous Q6H PRN Corinne Ports, PA-C   4 mg at 03/12/23 1713   oxyCODONE (Oxy IR/ROXICODONE) immediate release tablet 5 mg  5 mg Oral Q4H PRN Corinne Ports, PA-C   5 mg at 03/13/23 [id number]   polyethylene glycol (MIRALAX / GLYCOLAX) packet 17 g  17 g Oral Daily PRN Rushie Nyhan A, PA-C       senna-docusate (Senokot-S) tablet 1 tablet  1 tablet Oral QHS PRN Corinne Ports, PA-C         Discharge Medications: Please see discharge summary for a list of discharge medications.  Relevant Imaging Results:  Relevant Lab Results:   Additional Information SSN: [id number].  Pt is vaccinated for covid with one booster.  Joanne Chars, LCSW

## 2023-03-13 NOTE — Progress Notes (Signed)
Orthopaedic Trauma Progress Note  SUBJECTIVE: Doing okay this morning.  Notes some soreness in the left hip but no significant pain.  Has not been up out of bed yet since surgery. No chest pain. No SOB. No nausea/vomiting. No other complaints.   OBJECTIVE:  Vitals:   03/13/23 0517 03/13/23 0601  BP: 111/62   Pulse: 64   Resp: 16   Temp: 98 F (36.7 C)   SpO2: 97% 95%    General: Sitting up in bed eating breakfast, no acute distress Respiratory: No increased work of breathing.  LLE: Dressings CDI. Mildly tender over the hip. Tolerates gentle ankle ROM. Foot warm and well perfused.+ DP pulse  IMAGING: Stable post op imaging.   LABS:  Results for orders placed or performed during the hospital encounter of 03/11/23 (from the past 24 hour(s))  CBG monitoring, ED     Status: Abnormal   Collection Time: 03/12/23  7:41 AM  Result Value Ref Range   Glucose-Capillary 186 (H) 70 - 99 mg/dL  Glucose, capillary     Status: Abnormal   Collection Time: 03/12/23 11:52 AM  Result Value Ref Range   Glucose-Capillary 153 (H) 70 - 99 mg/dL  Glucose, capillary     Status: Abnormal   Collection Time: 03/12/23 12:16 PM  Result Value Ref Range   Glucose-Capillary 164 (H) 70 - 99 mg/dL  Glucose, capillary     Status: Abnormal   Collection Time: 03/12/23  4:53 PM  Result Value Ref Range   Glucose-Capillary 223 (H) 70 - 99 mg/dL  Glucose, capillary     Status: Abnormal   Collection Time: 03/12/23  8:50 PM  Result Value Ref Range   Glucose-Capillary 231 (H) 70 - 99 mg/dL  Comprehensive metabolic panel     Status: Abnormal   Collection Time: 03/13/23  1:56 AM  Result Value Ref Range   Sodium 134 (L) 135 - 145 mmol/L   Potassium 3.9 3.5 - 5.1 mmol/L   Chloride 96 (L) 98 - 111 mmol/L   CO2 24 22 - 32 mmol/L   Glucose, Bld 161 (H) 70 - 99 mg/dL   BUN 20 8 - 23 mg/dL   Creatinine, Ser 1.25 (H) 0.44 - 1.00 mg/dL   Calcium 8.7 (L) 8.9 - 10.3 mg/dL   Total Protein 5.8 (L) 6.5 - 8.1 g/dL   Albumin  2.8 (L) 3.5 - 5.0 g/dL   AST 29 15 - 41 U/L   ALT 13 0 - 44 U/L   Alkaline Phosphatase 40 38 - 126 U/L   Total Bilirubin 0.4 0.3 - 1.2 mg/dL   GFR, Estimated 40 (L) >60 mL/min   Anion gap 14 5 - 15  CBC     Status: Abnormal   Collection Time: 03/13/23  1:56 AM  Result Value Ref Range   WBC 14.5 (H) 4.0 - 10.5 K/uL   RBC 3.37 (L) 3.87 - 5.11 MIL/uL   Hemoglobin 10.3 (L) 12.0 - 15.0 g/dL   HCT 31.7 (L) 36.0 - 46.0 %   MCV 94.1 80.0 - 100.0 fL   MCH 30.6 26.0 - 34.0 pg   MCHC 32.5 30.0 - 36.0 g/dL   RDW 13.2 11.5 - 15.5 %   Platelets 232 150 - 400 K/uL   nRBC 0.0 0.0 - 0.2 %    ASSESSMENT: Tanya Ho is a 89 y.o. female, 1 Day Post-Op s/p INTRAMEDULLARY NAIL LEFT INTERTROCHANTERIC FEMUR FRACTURE  CV/Blood loss: Acute blood loss anemia, Hgb 10.3 this AM. Hemodynamically  stable  PLAN: Weightbearing: WBAT LLE ROM: Unrestricted ROM  Incisional and dressing care: Reinforce dressings as needed  Showering: Ok to begin getting incisions wet 03/15/23 Orthopedic device(s): None  Pain management:  1. Tylenol 325-650 mg q 6 hours PRN 2. Robaxin 500 mg q 6 hours PRN 3. Oxycodone 5 mg q 4 hours PRN 4. Fentanyl 12.5-25 mcg q 2 hours PRN VTE prophylaxis:  Restart Eliquis today , SCDs ID:  Ancef 2gm post op Foley/Lines:  No foley, KVO IVFs Impediments to Fracture Healing: Vit D level pending, will start supplementation as indicated Dispo: PT/OT eval today, will likely need SNF. Plan to remove dressing LLE tomorrow 03/14/23. Okay for discharge from ortho standpoint once cleared by medicine team and therapies  D/C recommendations: - Tylenol for pain control - Eliquis for DVT prophylaxis - Possible need for Vit D supplementation  Follow - up plan: 2 weeks after d/c   Contact information:  Katha Hamming MD, Rushie Nyhan PA-C. After hours and holidays please check [email or web address] for group call information for [facility]   Gwinda Passe, PA-C [phone]  (office) [email or web address]

## 2023-03-13 NOTE — Progress Notes (Signed)
  Progress Note   Patient: Tanya Ho DOB: 13-Apr-1926 DOA: 03/11/2023     1 DOS: the patient was seen and examined on 03/13/2023   Brief hospital course: 89 y.o. female with medical history significant for hypertension, nonobstructive CAD, chronic HFpEF, and atrial fibrillation on Eliquis who presents emergency department with left hip pain after fall at home.   Plain radiographs of the left hip and pelvis demonstrate left intertrochanteric femur fracture.   Assessment and Plan: 1. Left hip fracture -Home eliquis has been on hold in anticipation for surgery -Orthopedic Surgery consulted and pt is now s/p nailing of L intertrochanteric femur fracture -therapy recs for SNF noted   2. Atrial fibrillation   - Held Eliquis perioperatively, now resumed - continue metoprolol as tolerated   3. Hypertensive urgency  - BP now well controlled - Continue metoprolol -continue analgesia as needed     4. Chronic HFpEF  - Appeared compensated   - Continue beta-blocker -follow I/O's   5. CAD  - Hx of non-obstructive CAD  - denied chest pains this AM -cont beta blocker   6. Hyperglycemia  - Presenting serum glucose is 209 in ED  - Likely stress reaction vs new DM  - A1c 6.9 - Continue SSI   7. CKD IIIa  - Renally-dose medications -Baseline Cr between 1.0 and 1.3 -Stable currently -Recheck bmet in AM  8. Hypokalemia -normalized -recheck bmet in AM      Subjective: Without complaints this AM. Denies sob or chest pains  Physical Exam: Vitals:   03/13/23 0517 03/13/23 0601 03/13/23 0759 03/13/23 1250  BP: 111/62  (!) 110/95 (!) 116/58  Pulse: 64  74 73  Resp: 16   18  Temp: 98 F (36.7 C)  98.2 F (36.8 C) 97.7 F (36.5 C)  TempSrc: Oral  Oral Oral  SpO2: 97% 95% 98% 100%  Weight:      Height:       General exam: Conversant, in no acute distress Respiratory system: normal chest rise, clear, no audible wheezing Cardiovascular system: regular rhythm,  s1-s2 Gastrointestinal system: Nondistended, nontender, pos BS Central nervous system: No seizures, no tremors Extremities: No cyanosis, no joint deformities Skin: No rashes, no pallor Psychiatry: Affect normal // no auditory hallucinations   Data Reviewed:  Labs reviewed: Na 134, K 3.9, Cr 1.25, WBC 14.5, Hgb 10.3   Family Communication: Pt in room, family not at bedside  Disposition: Status is: Inpatient Remains inpatient appropriate because: Severity of illness  Planned Discharge Destination: Skilled nursing facility    Author: Marylu Lund, MD 03/13/2023 4:57 PM  For on call review [email or web address].

## 2023-03-13 NOTE — Progress Notes (Signed)
MEDICATION-RELATED CONSULT NOTE   Fracture Care Post-Operative Anticoagulation Per Pharmacy Consult   Procedure: cephalomedullary nailing of L intertrochanteric femur fx    Completed: 03/12/23  Antithrombotic medications on inpatient or PTA profile prior to procedure: Eliquis 5mg  BID  Other considerations: hgb >/= 9, no transfusion documented  Plan:     Resume Eliquis 5mg  PO BID  Dimple Nanas, PharmD, BCPS 03/13/2023 7:58 AM

## 2023-03-13 NOTE — Evaluation (Signed)
Physical Therapy Evaluation Patient Details Name: Tanya Ho MRN: [id number] DOB: 1926/06/06 Today's Date: 03/13/2023  History of Present Illness  Patient is a 89 y/o female who presents on 3/24 with left intertrochanteric femur fx s/p fall now s/p IMN left femur 3/25. PMH includes CAD, HTN, obesity.  Clinical Impression  Patient presents with pain, impaired cognition and impaired mobility s/p above. Pt lives alone and reports being Mod I for ADLs and ambulating with RW at baseline. Today, session limited due to pain despite being pre medicated. Requires Max A for bed mobility. Able to get to EOB with increased time and assist but unable to attempt standing due to needing second person. Pt confused today, which I don't believe is her baseline. Reviewed some there ex. Would benefit from post acute rehab to maximize independence and mobility prior to return home. Will follow acutely.       Recommendations for follow up therapy are one component of a multi-disciplinary discharge planning process, led by the attending physician.  Recommendations may be updated based on patient status, additional functional criteria and insurance authorization.  Follow Up Recommendations Can patient physically be transported by private vehicle: No     Assistance Recommended at Discharge Frequent or constant Supervision/Assistance  Patient can return home with the following  Two people to help with walking and/or transfers;Help with stairs or ramp for entrance;Assist for transportation;Assistance with cooking/housework;Direct supervision/assist for medications management;Direct supervision/assist for financial management;A lot of help with bathing/dressing/bathroom    Equipment Recommendations None recommended by PT  Recommendations for Other Services       Functional Status Assessment Patient has had a recent decline in their functional status and demonstrates the ability to make significant improvements in  function in a reasonable and predictable amount of time.     Precautions / Restrictions Precautions Precautions: Fall Restrictions Weight Bearing Restrictions: Yes LLE Weight Bearing: Weight bearing as tolerated      Mobility  Bed Mobility Overal bed mobility: Needs Assistance Bed Mobility: Supine to Sit, Sit to Supine     Supine to sit: Max assist, HOB elevated Sit to supine: Max assist, HOB elevated   General bed mobility comments: ASsist with LLE , trunk and scooting bottom to get to EOB with increased time, use of rail.    Transfers                   General transfer comment: Deferred due to pain    Ambulation/Gait                  Stairs            Wheelchair Mobility    Modified Rankin (Stroke Patients Only)       Balance Overall balance assessment: Needs assistance Sitting-balance support: Feet supported, Single extremity supported Sitting balance-Leahy Scale: Poor Sitting balance - Comments: Pt leaning onr ight elbow as position of comfort due to pain and offloading LLE Postural control: Right lateral lean                                   Pertinent Vitals/Pain Pain Assessment Pain Assessment: Faces Faces Pain Scale: Hurts whole lot Pain Location: LLE with movement Pain Descriptors / Indicators: Sore, Operative site guarding, Grimacing, Guarding, Discomfort Pain Intervention(s): Monitored during session, Repositioned, Premedicated before session, Limited activity within patient's tolerance    Home Living Family/patient expects to be discharged to:: Private  residence Living Arrangements: Alone Available Help at Discharge: Family Type of Home: House Home Access: Stairs to enter Entrance Stairs-Rails: Right Entrance Stairs-Number of Steps: 2   Home Layout: One level Home Equipment: Conservation officer, nature (2 wheels)      Prior Function Prior Level of Function : Needs assist       Physical Assist : ADLs  (physical)   ADLs (physical): Bathing;IADLs Mobility Comments: Uses RW for ambulation. ADLs Comments: Some assist for bathing from daughter, mostly sink baths. Dresses self, does not drive.  Does some cooking.     Hand Dominance        Extremity/Trunk Assessment   Upper Extremity Assessment Upper Extremity Assessment: Defer to OT evaluation    Lower Extremity Assessment Lower Extremity Assessment: Generalized weakness;LLE deficits/detail LLE Deficits / Details: post op deficits in AROM/strength, limited by pain LLE Sensation: WNL    Cervical / Trunk Assessment Cervical / Trunk Assessment: Kyphotic  Communication   Communication: HOH  Cognition Arousal/Alertness: Awake/alert Behavior During Therapy: WFL for tasks assessed/performed Overall Cognitive Status: Impaired/Different from baseline Area of Impairment: Memory, Orientation, Following commands, Safety/judgement, Problem solving                 Orientation Level: Disoriented to, Situation   Memory: Decreased short-term memory Following Commands: Follows one step commands with increased time Safety/Judgement: Decreased awareness of deficits, Decreased awareness of safety   Problem Solving: Slow processing, Decreased initiation, Difficulty sequencing, Requires verbal cues, Requires tactile cues General Comments: Did not know she had surgery yesterday. Slightly confused.        General Comments General comments (skin integrity, edema, etc.): VSS on 02    Exercises General Exercises - Lower Extremity Ankle Circles/Pumps: AROM, Both, 10 reps, Supine   Assessment/Plan    PT Assessment Patient needs continued PT services  PT Problem List Decreased strength;Decreased range of motion;Decreased mobility;Decreased safety awareness;Decreased skin integrity;Pain;Decreased balance;Decreased cognition;Decreased activity tolerance       PT Treatment Interventions Therapeutic activities;Gait training;Therapeutic  exercise;Patient/family education;Balance training;Functional mobility training;DME instruction;Cognitive remediation    PT Goals (Current goals can be found in the Care Plan section)  Acute Rehab PT Goals Patient Stated Goal: none stated, decrease pain PT Goal Formulation: With patient Time For Goal Achievement: 03/27/23 Potential to Achieve Goals: Fair    Frequency Min 3X/week     Co-evaluation               AM-PAC PT "6 Clicks" Mobility  Outcome Measure Help needed turning from your back to your side while in a flat bed without using bedrails?: Total Help needed moving from lying on your back to sitting on the side of a flat bed without using bedrails?: Total Help needed moving to and from a bed to a chair (including a wheelchair)?: Total Help needed standing up from a chair using your arms (e.g., wheelchair or bedside chair)?: Total Help needed to walk in hospital room?: Total Help needed climbing 3-5 steps with a railing? : Total 6 Click Score: 6    End of Session Equipment Utilized During Treatment: Oxygen Activity Tolerance: Patient limited by pain Patient left: in bed;with call bell/phone within reach;with bed alarm set Nurse Communication: Mobility status PT Visit Diagnosis: Pain;Difficulty in walking, not elsewhere classified (R26.2);Muscle weakness (generalized) (M62.81) Pain - Right/Left: Left Pain - part of body: Leg    Time: [id number] PT Time Calculation (min) (ACUTE ONLY): 24 min   Charges:   PT Evaluation $PT Eval Moderate Complexity: 1 Mod PT  Treatments $Therapeutic Activity: 8-22 mins        Marisa Severin, PT, DPT Acute Rehabilitation Services Secure chat preferred Office [place] 03/13/2023, 11:28 AM

## 2023-03-13 NOTE — TOC Initial Note (Signed)
Transition of Care [facility]) - Initial/Assessment Note    Patient Details  Name: Tanya Ho MRN: [id number] Date of Birth: [birth year]  Transition [facility]) CM/SW Contact:    Joanne Chars, LCSW Phone Number: 03/13/2023, 2:34 PM  Clinical Narrative:    CSW met with pt, daughter [name], son Kasandra Knudsen and daughter in Sports [employer] [place].  Pt listed as oriented x1.  Pt very hard of hearing but did appear more oriented than x1.  CSW had some conversation with pt, but most discussion with children.  Pt lives alone, son lives behind her home.  No services, as family provides all support.  They are in agreement that pt will need SNF.  Medicare choice document provided, would prefer [place] location.  Permission given to send out referral in hub.              Expected Discharge Plan: Skilled Nursing Facility Barriers to Discharge: Continued Medical Work up, SNF Pending bed offer   Patient Goals and CMS Choice   CMS Medicare.gov Compare Post Acute Care list provided to:: Patient Represenative (must comment) (daughter and son) Choice offered to / list presented to : Adult Children      Expected Discharge Plan and Services In-house Referral: Clinical Social Work   Post Acute Care Choice: [place] Living arrangements for the past 2 months: [place]                                      Prior Living Arrangements/Services Living arrangements for the past 2 months: Single Family Home Lives with:: Self Patient language and need for interpreter reviewed:: Yes        Need for Family Participation in Patient Care: Yes (Comment) Care giver support system in place?: Yes (comment) Current home services: Other (comment) (none) Criminal Activity/Legal Involvement Pertinent to Current Situation/Hospitalization: No - Comment as needed  Activities of Daily Living   ADL Screening (condition at time of admission) Patient's cognitive ability adequate to safely complete  daily activities?: Yes Is the patient deaf or have difficulty hearing?: Yes Does the patient have difficulty seeing, even when wearing glasses/contacts?: No Does the patient have difficulty concentrating, remembering, or making decisions?: No Patient able to express need for assistance with ADLs?: Yes Does the patient have difficulty dressing or bathing?: Yes Independently performs ADLs?: No Communication: Needs assistance Is this a change from baseline?: Change from baseline, expected to last >3 days Dressing (OT): Needs assistance Is this a change from baseline?: Change from baseline, expected to last >3 days Grooming: Needs assistance Is this a change from baseline?: Change from baseline, expected to last >3 days Feeding: Independent Bathing: Needs assistance Is this a change from baseline?: Change from baseline, expected to last >3 days Toileting: Needs assistance Is this a change from baseline?: Change from baseline, expected to last >3days In/Out Bed: Needs assistance Is this a change from baseline?: Change from baseline, expected to last >3 days Does the patient have difficulty walking or climbing stairs?: Yes Weakness of Legs: Both Weakness of Arms/Hands: Both  Permission Sought/Granted                  Emotional Assessment Appearance:: Appears stated age Attitude/Demeanor/Rapport: Engaged Affect (typically observed): Appropriate, Pleasant Orientation: : Oriented to Self (per Epic, appeared more oriented than this)      Admission diagnosis:  Closed fracture of left hip, initial  encounter [facility]) [S72.002A] Closed left hip fracture, initial encounter [facility]) [S72.002A] Patient Active Problem List   Diagnosis Date Noted   Closed left hip fracture, initial encounter ([place]) 03/12/2023   Permanent atrial fibrillation ([place]) 03/12/2023   Stage 3a chronic kidney disease (CKD) ([place]) 03/12/2023   Chronic heart failure with preserved ejection fraction (HFpEF) ([place]) 03/12/2023    Hyperglycemia 03/12/2023   SOB (shortness of breath)    Coronary artery disease    Palpitations    Fatigue    Back pain    Knee pain    Hyperlipidemia    Bruises easily    OA (osteoarthritis)    Obesity    Hypertension    PCP:  Shon Baton, MD Pharmacy:   [id number] - [place], [place] [place] [address] Phone: [phone] Fax: [phone]  [address], [place] [address] Phone: [phone] Fax: [phone]     Social Determinants of Health (SDOH) Social History: SDOH Screenings   Food Insecurity: No Food Insecurity (03/12/2023)  Housing: Low Risk  (03/12/2023)  Transportation Needs: No Transportation Needs (03/12/2023)  Utilities: Not At Risk (03/12/2023)  Tobacco Use: Low Risk  (03/13/2023)   SDOH Interventions:     Readmission Risk Interventions     No data to display

## 2023-03-13 NOTE — Anesthesia Postprocedure Evaluation (Signed)
Anesthesia Post Note  Patient: Tanya Ho  Procedure(s) Performed: INTRAMEDULLARY (IM) NAIL INTERTROCHANTERIC (Left)     Patient location during evaluation: PACU Anesthesia Type: General Level of consciousness: awake and alert, oriented and patient cooperative Pain management: pain level controlled Vital Signs Assessment: post-procedure vital signs reviewed and stable Respiratory status: spontaneous breathing, nonlabored ventilation and respiratory function stable Cardiovascular status: blood pressure returned to baseline and stable Postop Assessment: no apparent nausea or vomiting Anesthetic complications: no   No notable events documented.  Last Vitals:  Vitals:   03/13/23 0759 03/13/23 1250  BP: (!) 110/95 (!) 116/58  Pulse: 74 73  Resp:  18  Temp: 36.8 C 36.5 C  SpO2: 98% 100%    Last Pain:  Vitals:   03/13/23 1250  TempSrc: Oral  PainSc:    Pain Goal: Patients Stated Pain Goal: 0 (03/11/23 2342)                 Pervis Hocking

## 2023-03-13 NOTE — Progress Notes (Signed)
There was consult for placement of PIV. Patient pulled out PIV access which was placed early in the morning today. Discussed with patient's RN because patient need a PIV access for pain medication. Patient has po pain medication, patient's RN administered po pain medication at this time and monitor pain scale. Pain doesn't control good then patient's RN will put in the new consult. HS [employer]

## 2023-03-13 NOTE — Evaluation (Signed)
Occupational Therapy Evaluation Patient Details Name: Tanya Ho MRN: [id number] DOB: 1926-06-18 Today's Date: 03/13/2023   History of Present Illness Patient is a 89 y/o female who presents on 3/24 with left intertrochanteric femur fx s/p fall now s/p IMN left femur 3/25. PMH includes CAD, HTN, obesity.   Clinical Impression   Pt is s/p above surgery for L femur fx for recent fall in bedroom. Pt PLOF independent with ADLs, minimal assistance for bathing, reports no other recent falls. Pt currently requires significant assistance for bed mobility and LB ADLs, x2 total A for standing, would benefit from skilled OT >3 hrs/week and continued skilled therapy to improve functional independence to PLOF.     Recommendations for follow up therapy are one component of a multi-disciplinary discharge planning process, led by the attending physician.  Recommendations may be updated based on patient status, additional functional criteria and insurance authorization.   Assistance Recommended at Discharge Frequent or constant Supervision/Assistance  Patient can return home with the following A lot of help with walking and/or transfers;A lot of help with bathing/dressing/bathroom;Assistance with cooking/housework;Assist for transportation;Help with stairs or ramp for entrance    Functional Status Assessment  Patient has had a recent decline in their functional status and demonstrates the ability to make significant improvements in function in a reasonable and predictable amount of time.  Equipment Recommendations  Tub/shower seat    Recommendations for Other Services       Precautions / Restrictions Precautions Precautions: Fall Restrictions Weight Bearing Restrictions: Yes LLE Weight Bearing: Weight bearing as tolerated      Mobility Bed Mobility Overal bed mobility: Needs Assistance Bed Mobility: Supine to Sit, Sit to Supine     Supine to sit: HOB elevated, Max assist Sit to supine: Total  assist   General bed mobility comments: unable to lift LLE, pain with movement, total for scooting in bed    Transfers Overall transfer level: Needs assistance Equipment used: Rolling walker (2 wheels), 2 person hand held assist Transfers: Sit to/from Stand Sit to Stand: Total assist, +2 physical assistance           General transfer comment: unable to stand with RW x1 assistance today      Balance Overall balance assessment: Needs assistance Sitting-balance support: Feet supported, Single extremity supported Sitting balance-Leahy Scale: Fair     Standing balance support: Reliant on assistive device for balance Standing balance-Leahy Scale: Zero Standing balance comment: PT unable to stand today with RW x1 assistance, unable to put pressure on LLE                           ADL either performed or assessed with clinical judgement   ADL Overall ADL's : Needs assistance/impaired Eating/Feeding: Set up   Grooming: Set up   Upper Body Bathing: Minimal assistance   Lower Body Bathing: Total assistance   Upper Body Dressing : Minimal assistance   Lower Body Dressing: Total assistance   Toilet Transfer: Total assistance;+2 for physical assistance;Rolling walker (2 wheels)   Toileting- Clothing Manipulation and Hygiene: Total assistance   Tub/ Shower Transfer: Total assistance;+2 for physical assistance;Rolling walker (2 wheels)   Functional mobility during ADLs: Total assistance;+2 for physical assistance;Rolling walker (2 wheels) General ADL Comments: Pt has difficulty bending at hip due to RLE pain, unable to perform LB ADLs, unable to stand today with x1 assist with gait belt and RW, requires x2 assist     Vision Baseline  Vision/History: 1 Wears glasses       Perception     Praxis      Pertinent Vitals/Pain Pain Assessment Pain Assessment: Faces Pain Score: 7  Pain Intervention(s): Premedicated before session, Monitored during session, Limited  activity within patient's tolerance     Hand Dominance     Extremity/Trunk Assessment Upper Extremity Assessment Upper Extremity Assessment: Overall WFL for tasks assessed;Generalized weakness           Communication Communication Communication: HOH   Cognition Arousal/Alertness: Awake/alert Behavior During Therapy: WFL for tasks assessed/performed Overall Cognitive Status: Within Functional Limits for tasks assessed                                       General Comments  Pt has cut on back of L hand, large bruise R ant. forearm    Exercises     Shoulder Instructions      Home Living Family/patient expects to be discharged to:: Private residence Living Arrangements: Alone Available Help at Discharge: Family Type of Home: House Home Access: Stairs to enter Technical brewer of Steps: 2 Entrance Stairs-Rails: Right Home Layout: One level         Bathroom Toilet: Standard     Home Equipment: Conservation officer, nature (2 wheels)          Prior Functioning/Environment Prior Level of Function : Needs assist       Physical Assist : ADLs (physical)   ADLs (physical): Bathing;IADLs Mobility Comments: Uses RW for ambulation. ADLs Comments: Some assist for bathing from daughter, mostly sink baths. Dresses self, does not drive.  Does some cooking.        OT Problem List: Decreased strength;Decreased range of motion;Decreased activity tolerance;Impaired balance (sitting and/or standing);Pain      OT Treatment/Interventions: Self-care/ADL training;Therapeutic exercise;Energy conservation;DME and/or AE instruction;Therapeutic activities;Patient/family education    OT Goals(Current goals can be found in the care plan section) Acute Rehab OT Goals Patient Stated Goal: To regain strength, mobility, and return home OT Goal Formulation: With patient Time For Goal Achievement: 03/27/23 Potential to Achieve Goals: Good  OT Frequency: Min 2X/week     Co-evaluation              AM-PAC OT "6 Clicks" Daily Activity     Outcome Measure Help from another person eating meals?: A Little Help from another person taking care of personal grooming?: A Little Help from another person toileting, which includes using toliet, bedpan, or urinal?: A Lot Help from another person bathing (including washing, rinsing, drying)?: A Lot Help from another person to put on and taking off regular upper body clothing?: A Little Help from another person to put on and taking off regular lower body clothing?: Total 6 Click Score: 14   End of Session Equipment Utilized During Treatment: Gait belt;Rolling walker (2 wheels);Oxygen Nurse Communication: Mobility status;Weight bearing status  Activity Tolerance: Patient limited by pain Patient left: in bed;with call bell/phone within reach;with bed alarm set  OT Visit Diagnosis: Unsteadiness on feet (R26.81);Other abnormalities of gait and mobility (R26.89);Muscle weakness (generalized) (M62.81);History of falling (Z91.81);Pain Pain - Right/Left: Left Pain - part of body: Leg                Time: 0930-1000 OT Time Calculation (min): 30 min Charges:  OT General Charges $OT Visit: 1 Visit OT Evaluation $OT Eval Moderate Complexity: 1 Mod OT Treatments $Self Care/Home  Management : 8-22 mins  [place] 03/13/2023, 10:15 AM

## 2023-03-14 DIAGNOSIS — R300 Dysuria: Secondary | ICD-10-CM

## 2023-03-14 DIAGNOSIS — I4821 Permanent atrial fibrillation: Secondary | ICD-10-CM | POA: Diagnosis not present

## 2023-03-14 DIAGNOSIS — R739 Hyperglycemia, unspecified: Secondary | ICD-10-CM | POA: Diagnosis not present

## 2023-03-14 DIAGNOSIS — I5032 Chronic diastolic (congestive) heart failure: Secondary | ICD-10-CM | POA: Diagnosis not present

## 2023-03-14 DIAGNOSIS — S72002A Fracture of unspecified part of neck of left femur, initial encounter for closed fracture: Secondary | ICD-10-CM | POA: Diagnosis not present

## 2023-03-14 LAB — COMPREHENSIVE METABOLIC PANEL
ALT: 10 U/L (ref 0–44)
AST: 20 U/L (ref 15–41)
Albumin: 2.7 g/dL — ABNORMAL LOW (ref 3.5–5.0)
Alkaline Phosphatase: 40 U/L (ref 38–126)
Anion gap: 11 (ref 5–15)
BUN: 27 mg/dL — ABNORMAL HIGH (ref 8–23)
CO2: 27 mmol/L (ref 22–32)
Calcium: 8.5 mg/dL — ABNORMAL LOW (ref 8.9–10.3)
Chloride: 95 mmol/L — ABNORMAL LOW (ref 98–111)
Creatinine, Ser: 1.03 mg/dL — ABNORMAL HIGH (ref 0.44–1.00)
GFR, Estimated: 50 mL/min — ABNORMAL LOW (ref >60)
Glucose, Bld: 169 mg/dL — ABNORMAL HIGH (ref 70–99)
Potassium: 4.2 mmol/L (ref 3.5–5.1)
Sodium: 133 mmol/L — ABNORMAL LOW (ref 135–145)
Total Bilirubin: 0.8 mg/dL (ref 0.3–1.2)
Total Protein: 5.7 g/dL — ABNORMAL LOW (ref 6.5–8.1)

## 2023-03-14 LAB — CBC
HCT: 27.9 % — ABNORMAL LOW (ref 36.0–46.0)
Hemoglobin: 9.1 g/dL — ABNORMAL LOW (ref 12.0–15.0)
MCH: 30.7 pg (ref 26.0–34.0)
MCHC: 32.6 g/dL (ref 30.0–36.0)
MCV: 94.3 fL (ref 80.0–100.0)
Platelets: 218 10*3/uL (ref 150–400)
RBC: 2.96 MIL/uL — ABNORMAL LOW (ref 3.87–5.11)
RDW: 13.3 % (ref 11.5–15.5)
WBC: 15.7 10*3/uL — ABNORMAL HIGH (ref 4.0–10.5)
nRBC: 0 % (ref 0.0–0.2)

## 2023-03-14 LAB — GLUCOSE, CAPILLARY
Glucose-Capillary: 148 mg/dL — ABNORMAL HIGH (ref 70–99)
Glucose-Capillary: 172 mg/dL — ABNORMAL HIGH (ref 70–99)
Glucose-Capillary: 120 mg/dL — ABNORMAL HIGH (ref 70–99)
Glucose-Capillary: 128 mg/dL — ABNORMAL HIGH (ref 70–99)

## 2023-03-14 MED ORDER — MELATONIN 3 MG PO TABS
3.0000 mg | ORAL_TABLET | Freq: Every day | ORAL | Status: DC
  Administered 2023-03-14: 3 mg via ORAL
  Filled 2023-03-14: qty 1

## 2023-03-14 MED ORDER — SODIUM CHLORIDE 0.9 % IV SOLN
1.0000 g | INTRAVENOUS | Status: DC
  Administered 2023-03-15: 1 g via INTRAVENOUS
  Filled 2023-03-14: qty 10

## 2023-03-14 MED ORDER — ACETAMINOPHEN 325 MG PO TABS: 325.0000 mg | ORAL_TABLET | Freq: Four times a day (QID) | ORAL | Status: DC | PRN

## 2023-03-14 MED ORDER — SODIUM CHLORIDE 0.9 % IV BOLUS
500.0000 mL | Freq: Once | INTRAVENOUS | Status: AC
  Administered 2023-03-14: 500 mL via INTRAVENOUS

## 2023-03-14 NOTE — Discharge Instructions (Signed)
Orthopaedic Trauma Service Discharge Instructions   General Discharge Instructions  WEIGHT BEARING STATUS:weightbearing as tolerated  RANGE OF MOTION/ACTIVITY: Unrestricted hip range of motion  Wound Care: You may remove your surgical dressing. Incisions can be left open to air if there is no drainage. Once the incision is completely dry and without drainage, it may be left open to air out. You may begin showering. Clean incision gently with soap and water.  DVT/PE prophylaxis: Aspirin x 30 days  Diet: as you were eating previously.  Can use over the counter stool softeners and bowel preparations, such as Miralax, to help with bowel movements.  Narcotics can be constipating.  Be sure to drink plenty of fluids  PAIN MEDICATION USE AND EXPECTATIONS  You have likely been given narcotic medications to help control your pain.  After a traumatic event that results in an fracture (broken bone) with or without surgery, it is ok to use narcotic pain medications to help control one's pain.  We understand that everyone responds to pain differently and each individual patient will be evaluated on a regular basis for the continued need for narcotic medications. Ideally, narcotic medication use should last no more than 6-8 weeks (coinciding with fracture healing).   As a patient it is your responsibility as well to monitor narcotic medication use and report the amount and frequency you use these medications when you come to your office visit.   We would also advise that if you are using narcotic medications, you should take a dose prior to therapy to maximize you participation.  IF YOU ARE ON NARCOTIC MEDICATIONS IT IS NOT PERMISSIBLE TO OPERATE A MOTOR VEHICLE (MOTORCYCLE/CAR/TRUCK/MOPED) OR HEAVY MACHINERY DO NOT MIX NARCOTICS WITH OTHER CNS (CENTRAL NERVOUS SYSTEM) DEPRESSANTS SUCH AS ALCOHOL   STOP SMOKING OR USING NICOTINE PRODUCTS!!!!  As discussed nicotine severely impairs your body's ability  to heal surgical and traumatic wounds but also impairs bone healing.  Wounds and bone heal by forming microscopic blood vessels (angiogenesis) and nicotine is a vasoconstrictor (essentially, shrinks blood vessels).  Therefore, if vasoconstriction occurs to these microscopic blood vessels they essentially disappear and are unable to deliver necessary nutrients to the healing tissue.  This is one modifiable factor that you can do to dramatically increase your chances of healing your injury.    (This means no smoking, no nicotine gum, patches, etc)  DO NOT USE NONSTEROIDAL ANTI-INFLAMMATORY DRUGS (NSAID'S)  Using products such as Advil (ibuprofen), Aleve (naproxen), Motrin (ibuprofen) for additional pain control during fracture healing can delay and/or prevent the healing response.  If you would like to take over the counter (OTC) medication, Tylenol (acetaminophen) is ok.  However, some narcotic medications that are given for pain control contain acetaminophen as well. Therefore, you should not exceed more than 4000 mg of tylenol in a day if you do not have liver disease.  Also note that there are may OTC medicines, such as cold medicines and allergy medicines that my contain tylenol as well.  If you have any questions about medications and/or interactions please ask your doctor/PA or your pharmacist.      ICE AND ELEVATE INJURED/OPERATIVE EXTREMITY  Using ice and elevating the injured extremity above your heart can help with swelling and pain control.  Icing in a pulsatile fashion, such as 20 minutes on and 20 minutes off, can be followed.    Do not place ice directly on skin. Make sure there is a barrier between to skin and the ice pack.  Using frozen items such as frozen peas works well as the conform nicely to the are that needs to be iced.  USE AN ACE WRAP OR TED HOSE FOR SWELLING CONTROL  In addition to icing and elevation, Ace wraps or TED hose are used to help limit and resolve swelling.  It is  recommended to use Ace wraps or TED hose until you are informed to stop.    When using Ace Wraps start the wrapping distally (farthest away from the body) and wrap proximally (closer to the body)   Example: If you had surgery on your leg or thing and you do not have a splint on, start the ace wrap at the toes and work your way up to the thigh        If you had surgery on your upper extremity and do not have a splint on, start the ace wrap at your fingers and work your way up to the upper arm   [place]: [phone]   VISIT OUR WEBSITE FOR ADDITIONAL INFORMATION: [email or web address]    Discharge Wound Care Instructions  Do NOT apply any ointments, solutions or lotions to pin sites or surgical wounds.  These prevent needed drainage and even though solutions like hydrogen peroxide kill bacteria, they also damage cells lining the pin sites that help fight infection.  Applying lotions or ointments can keep the wounds moist and can cause them to breakdown and open up as well. This can increase the risk for infection. When in doubt call the office.   If any drainage is noted, use foam dressings  Once the incision is completely dry and without drainage, it may be left open to air out.  Showering may begin 36-48 hours later.  Cleaning gently with soap and water.

## 2023-03-14 NOTE — Progress Notes (Cosign Needed Addendum)
Orthopaedic Trauma Progress Note  SUBJECTIVE: Doing okay this morning.  Notes some soreness in the left hip but no significant pain.  No chest pain. No SOB. No nausea/vomiting. No other complaints.   OBJECTIVE:  Vitals:   03/14/23 0435 03/14/23 0750  BP: 131/70 131/82  Pulse: 73 78  Resp: 18 15  Temp:  (!) 97.5 F (36.4 C)  SpO2: 90% 92%    General: Resting comfortably in bed, no acute distress Respiratory: No increased work of breathing.  LLE: Dressings removed, incisions CDI. Mildly tender over the hip. Tolerates gentle ankle ROM. Foot warm and well perfused.+ DP pulse  IMAGING: Stable post op imaging.   LABS:  Results for orders placed or performed during the hospital encounter of 03/11/23 (from the past 24 hour(s))  Glucose, capillary     Status: Abnormal   Collection Time: 03/13/23 11:33 AM  Result Value Ref Range   Glucose-Capillary 176 (H) 70 - 99 mg/dL  Glucose, capillary     Status: Abnormal   Collection Time: 03/13/23  4:06 PM  Result Value Ref Range   Glucose-Capillary 159 (H) 70 - 99 mg/dL  Glucose, capillary     Status: Abnormal   Collection Time: 03/13/23  9:52 PM  Result Value Ref Range   Glucose-Capillary 159 (H) 70 - 99 mg/dL  Comprehensive metabolic panel     Status: Abnormal   Collection Time: 03/14/23  2:17 AM  Result Value Ref Range   Sodium 133 (L) 135 - 145 mmol/L   Potassium 4.2 3.5 - 5.1 mmol/L   Chloride 95 (L) 98 - 111 mmol/L   CO2 27 22 - 32 mmol/L   Glucose, Bld 169 (H) 70 - 99 mg/dL   BUN 27 (H) 8 - 23 mg/dL   Creatinine, Ser 1.03 (H) 0.44 - 1.00 mg/dL   Calcium 8.5 (L) 8.9 - 10.3 mg/dL   Total Protein 5.7 (L) 6.5 - 8.1 g/dL   Albumin 2.7 (L) 3.5 - 5.0 g/dL   AST 20 15 - 41 U/L   ALT 10 0 - 44 U/L   Alkaline Phosphatase 40 38 - 126 U/L   Total Bilirubin 0.8 0.3 - 1.2 mg/dL   GFR, Estimated 50 (L) >60 mL/min   Anion gap 11 5 - 15  CBC     Status: Abnormal   Collection Time: 03/14/23  2:17 AM  Result Value Ref Range   WBC 15.7 (H)  4.0 - 10.5 K/uL   RBC 2.96 (L) 3.87 - 5.11 MIL/uL   Hemoglobin 9.1 (L) 12.0 - 15.0 g/dL   HCT 27.9 (L) 36.0 - 46.0 %   MCV 94.3 80.0 - 100.0 fL   MCH 30.7 26.0 - 34.0 pg   MCHC 32.6 30.0 - 36.0 g/dL   RDW 13.3 11.5 - 15.5 %   Platelets 218 150 - 400 K/uL   nRBC 0.0 0.0 - 0.2 %  Glucose, capillary     Status: Abnormal   Collection Time: 03/14/23  7:48 AM  Result Value Ref Range   Glucose-Capillary 148 (H) 70 - 99 mg/dL    ASSESSMENT: Tanya Ho is a 89 y.o. female, 2 Days Post-Op s/p INTRAMEDULLARY NAIL LEFT INTERTROCHANTERIC FEMUR FRACTURE  CV/Blood loss: Acute blood loss anemia, Hgb 9.1 this AM. Hemodynamically stable  PLAN: Weightbearing: WBAT LLE ROM: Unrestricted ROM  Incisional and dressing care: removed today, change PRN Showering: Ok to begin getting incisions wet 03/15/23 Orthopedic device(s): None  Pain management:  1. Tylenol 325-650 mg q 6  hours PRN 2. Robaxin 500 mg q 6 hours PRN 3. Oxycodone 5 mg q 4 hours PRN 4. Fentanyl 12.5-25 mcg q 2 hours PRN VTE prophylaxis:  Eliquis , SCDs ID:  Ancef 2gm post op completed Foley/Lines:  No foley, KVO IVFs Impediments to Fracture Healing: Vit D level 99, no supplementation needed Dispo: PT/OT eval ongoing, recommending SNF. Okay for discharge from ortho standpoint once cleared by medicine team and therapies  D/C recommendations: - Continue home dose Tramadol and Tylenol for pain control - Eliquis for DVT prophylaxis - No need for additonal Vit D supplementation  Follow - up plan: 2 weeks after d/c   Contact information:  Katha Hamming MD, Rushie Nyhan PA-C. After hours and holidays please check [email or web address] for group call information for [facility]   Gwinda Passe, PA-C [phone] (office) [email or web address]

## 2023-03-14 NOTE — Progress Notes (Signed)
Mobility Specialist Progress Note   03/14/23 1400  Mobility  Activity Dangled on edge of bed (Bed level exercise)  Level of Assistance Maximum assist, patient does 25-49%  Assistive Device None  Range of Motion/Exercises All extremities  Activity Response Tolerated well   Patient received in supine and agreeable to participate. Requires max A for bed mobility + extra time and cues to use bed railing. Completed LE exercise with supervision while dangling EOB. Needed occasional min A to maintain static sitting balance while dangling. Tolerated without complaint or incident. Was left in supine with all needs met, call bell in reach.   [place] Jacoya Bauman, BS EXP Mobility Specialist Please contact via SecureChat or Rehab office at [phone]

## 2023-03-14 NOTE — Progress Notes (Signed)
Attempted cath on pt. urine flow seems to be obstructed. there is hard resistance when trying to move forward with cath this has happened x 2 today but earlier pt did have a wet bed and we changed it. no urine in purewick container so bladder scan shows greater than 646ml in her bladder which is probably more because those machines are inaccurate. pt was tearful and procedure traumatic MD notified for night shift

## 2023-03-14 NOTE — TOC Progression Note (Signed)
Transition of Care [facility]) - Progression Note    Patient Details  Name: Tanya Ho MRN: [id number] Date of Birth: May 30, 1926  [facility] CM/SW Contact  Joanne Chars, LCSW Phone Number: 03/14/2023, 2:01 PM  Clinical Narrative:   Bed offers provided to son and daughter in law, they would like to accept offer at [facility].  CSW confirmed with [facility], who says bed available tomorrow.     Expected Discharge Plan: [place] Barriers to Discharge: Continued Medical Work up, SNF Pending bed offer  Expected Discharge Plan and Services In-house Referral: Clinical Social Work   Post Acute Care Choice: [place] Living arrangements for the past 2 months: Single Family Home                                       Social Determinants of Health (SDOH) Interventions SDOH Screenings   Food Insecurity: No Food Insecurity (03/12/2023)  Housing: Low Risk  (03/12/2023)  Transportation Needs: No Transportation Needs (03/12/2023)  Utilities: Not At Risk (03/12/2023)  Tobacco Use: Low Risk  (03/13/2023)    Readmission Risk Interventions     No data to display

## 2023-03-14 NOTE — Progress Notes (Signed)
PROGRESS NOTE    Tanya Ho  [id number] DOB: [birth year] DOA: 03/11/2023 PCP: Shon Baton, MD   Brief Narrative: Tanya Ho is a 89 y.o. female with hypertension, nonobstructive CAD, chronic heart failure with preserved EF, permanent atrial fibrillation on Eliquis.  Patient presented secondary to a fall without syncope and subsequent left hip pain.  Patient was found to have comminuted impacted fracture of the left proximal femur.  Orthopedic surgery consulted and performed cephalomedullary nail placement on 3/25 with recommendations for weightbearing as tolerated to the left lower extremity.   Assessment and Plan:  Left hip fracture Secondary to fall without syncope. Patient underwent successful left cephalomedullary nailing of her femur fracture on 3/25. Postop orthopedic surgery recommendations for weightbearing as tolerated to the LLE. Patient worked with PT/OT and will plan for discharge to SNF.  Permanent atrial fibrillation Patient follows with EP as an outpatient. She is managed on Eliquis and metoprolol. Rate controlled. -Continue Eliquis and metoprolol  Postoperative anemia Acute blood loss anemia Baseline hemoglobin of 12 to 13.  Likely secondary to perioperative blood loss, although EBL documented as 20 mL.  Hemoglobin trending downward with hemoglobin of 9.1 this morning. -Trend CBC daily, and transfuse for hemoglobin less than 7  Severe asymptomatic hypertension Blood pressure as high as 184/93 on admission, possibly secondary to acute pain. Blood pressure improved during hospitalization.  Dysuria Possible urinary infection -Urinalysis with reflex culture -Ceftriaxone pending urine culture collection  Chronic HFpEF Euvolemic.  CAD Patient is not on aspirin or Plavix, but is on Eliquis. -Continue metoprolol  Diabetes mellitus type 2 Hemoglobin A1C of 6.9%  CKD stage IIIa Stable.  Hypokalemia Resolved with supplementation   DVT prophylaxis:  Eliquis Code Status:   Code Status: Full Code Family Communication: None at bedside Disposition Plan: Discharge to SNF likely in 24 hours pending urine workup and stable hemoglobin   Consultants:  Orthopedic surgery  Procedures:  3/25: Cephalomedullary nailing of left intertrochanteric femur fracture  Antimicrobials: None    Subjective: Patient reports not feeling too well today. States she has not been sleeping well. She also reports her urine feeling hot. She has a history of UTI but nothing recent.  Objective: BP 131/82 (BP Location: Left Arm)   Pulse 78   Temp (!) 97.5 F (36.4 C) (Oral)   Resp 15   Ht 5\' 6"  (1.676 m)   Wt 80 kg   SpO2 92%   BMI 28.47 kg/m   Examination:  General exam: Appears calm and comfortable Respiratory system: Clear to auscultation. Respiratory effort normal. Cardiovascular system: S1 & S2 heard, RRR. No murmurs, rubs, gallops or clicks. Gastrointestinal system: Abdomen is nondistended, soft and nontender. Normal bowel sounds heard. Central nervous system: Alert and oriented. No focal neurological deficits. Musculoskeletal: No edema. No calf tenderness Skin: No cyanosis. No rashes. Incision site dressings appear clean. No surrounding ecchymosis noted. Psychiatry: Judgement and insight appear normal. Mood & affect appropriate.    Data Reviewed: I have personally reviewed following labs and imaging studies  CBC Lab Results  Component Value Date   WBC 15.7 (H) 03/14/2023   RBC 2.96 (L) 03/14/2023   HGB 9.1 (L) 03/14/2023   HCT 27.9 (L) 03/14/2023   MCV 94.3 03/14/2023   MCH 30.7 03/14/2023   PLT 218 03/14/2023   MCHC 32.6 03/14/2023   RDW 13.3 03/14/2023   LYMPHSABS 2.2 03/11/2023   MONOABS 0.8 03/11/2023   EOSABS 0.1 03/11/2023   BASOSABS 0.1 03/11/2023  Last metabolic panel Lab Results  Component Value Date   NA 133 (L) 03/14/2023   K 4.2 03/14/2023   CL 95 (L) 03/14/2023   CO2 27 03/14/2023   BUN 27 (H) 03/14/2023    CREATININE 1.03 (H) 03/14/2023   GLUCOSE 169 (H) 03/14/2023   GFRNONAA 50 (L) 03/14/2023   GFRAA 59 (L) 09/08/2020   CALCIUM 8.5 (L) 03/14/2023   PROT 5.7 (L) 03/14/2023   ALBUMIN 2.7 (L) 03/14/2023   BILITOT 0.8 03/14/2023   ALKPHOS 40 03/14/2023   AST 20 03/14/2023   ALT 10 03/14/2023   ANIONGAP 11 03/14/2023    GFR: Estimated Creatinine Clearance: 34.9 mL/min (A) (by C-G formula based on SCr of 1.03 mg/dL (H)).  No results found for this or any previous visit (from the past 240 hour(s)).    Radiology Studies: DG FEMUR PORT MIN 2 VIEWS LEFT  Result Date: 03/12/2023 CLINICAL DATA:  Post intramedullary nail left femur EXAM: LEFT FEMUR PORTABLE 2 VIEWS COMPARISON:  02/11/2023 FINDINGS: Status post intramedullary nail fixation of intratrochanteric fractures of the left femur, with mild persistent displacement fracture fragments. No evidence of perihardware fracture or distal femoral fracture. Vascular calcinosis. IMPRESSION: Status post intramedullary nail fixation of intratrochanteric fractures of the left femur, with mild persistent displacement fracture fragments. No evidence of perihardware fracture or distal femoral fracture. Electronically Signed   By: Delanna Ahmadi M.D.   On: 03/12/2023 16:52   DG FEMUR MIN 2 VIEWS LEFT  Result Date: 03/12/2023 CLINICAL DATA:  Elective surgery EXAM: LEFT FEMUR 2 VIEWS COMPARISON:  None Available. FINDINGS: Seven intraprocedural fluoroscopic images show intramedullary nail fixation of proximal femur fracture with distal interlocking screw. No unexpected finding. Fracture alignment is anatomic. IMPRESSION: Fluoroscopy for femur fracture fixation.  No unexpected finding. Electronically Signed   By: Jorje Guild M.D.   On: 03/12/2023 15:06   DG C-Arm 1-60 Min-No Report  Result Date: 03/12/2023 Fluoroscopy was utilized by the requesting physician.  No radiographic interpretation.      LOS: 2 days    Cordelia Poche, MD [facility] 03/14/2023, 9:59 AM   If 7PM-7AM, please contact night-coverage [email or web address]

## 2023-03-15 DIAGNOSIS — Z466 Encounter for fitting and adjustment of urinary device: Secondary | ICD-10-CM | POA: Diagnosis not present

## 2023-03-15 DIAGNOSIS — R300 Dysuria: Secondary | ICD-10-CM | POA: Insufficient documentation

## 2023-03-15 DIAGNOSIS — I4821 Permanent atrial fibrillation: Secondary | ICD-10-CM | POA: Diagnosis not present

## 2023-03-15 DIAGNOSIS — S72002A Fracture of unspecified part of neck of left femur, initial encounter for closed fracture: Secondary | ICD-10-CM | POA: Diagnosis not present

## 2023-03-15 DIAGNOSIS — R531 Weakness: Secondary | ICD-10-CM | POA: Diagnosis not present

## 2023-03-15 DIAGNOSIS — R338 Other retention of urine: Secondary | ICD-10-CM | POA: Insufficient documentation

## 2023-03-15 DIAGNOSIS — Z9181 History of falling: Secondary | ICD-10-CM | POA: Diagnosis not present

## 2023-03-15 DIAGNOSIS — N1831 Chronic kidney disease, stage 3a: Secondary | ICD-10-CM | POA: Diagnosis not present

## 2023-03-15 DIAGNOSIS — S72002D Fracture of unspecified part of neck of left femur, subsequent encounter for closed fracture with routine healing: Secondary | ICD-10-CM | POA: Diagnosis not present

## 2023-03-15 DIAGNOSIS — E1122 Type 2 diabetes mellitus with diabetic chronic kidney disease: Secondary | ICD-10-CM | POA: Diagnosis not present

## 2023-03-15 DIAGNOSIS — I130 Hypertensive heart and chronic kidney disease with heart failure and stage 1 through stage 4 chronic kidney disease, or unspecified chronic kidney disease: Secondary | ICD-10-CM | POA: Diagnosis not present

## 2023-03-15 DIAGNOSIS — I5032 Chronic diastolic (congestive) heart failure: Secondary | ICD-10-CM | POA: Diagnosis not present

## 2023-03-15 DIAGNOSIS — Z7401 Bed confinement status: Secondary | ICD-10-CM | POA: Diagnosis not present

## 2023-03-15 DIAGNOSIS — R339 Retention of urine, unspecified: Secondary | ICD-10-CM | POA: Diagnosis not present

## 2023-03-15 DIAGNOSIS — I2510 Atherosclerotic heart disease of native coronary artery without angina pectoris: Secondary | ICD-10-CM | POA: Diagnosis not present

## 2023-03-15 LAB — CBC
HCT: 26 % — ABNORMAL LOW (ref 36.0–46.0)
Hemoglobin: 8.8 g/dL — ABNORMAL LOW (ref 12.0–15.0)
MCH: 31.5 pg (ref 26.0–34.0)
MCHC: 33.8 g/dL (ref 30.0–36.0)
MCV: 93.2 fL (ref 80.0–100.0)
Platelets: 250 10*3/uL (ref 150–400)
RBC: 2.79 MIL/uL — ABNORMAL LOW (ref 3.87–5.11)
RDW: 13.4 % (ref 11.5–15.5)
WBC: 13.9 10*3/uL — ABNORMAL HIGH (ref 4.0–10.5)
nRBC: 0.2 % (ref 0.0–0.2)

## 2023-03-15 LAB — URINALYSIS, W/ REFLEX TO CULTURE (INFECTION SUSPECTED)
Bilirubin Urine: NEGATIVE
Glucose, UA: NEGATIVE mg/dL
Ketones, ur: NEGATIVE mg/dL
Nitrite: NEGATIVE
Protein, ur: NEGATIVE mg/dL
Specific Gravity, Urine: 1.018 (ref 1.005–1.030)
pH: 5 (ref 5.0–8.0)

## 2023-03-15 LAB — HEMOGLOBIN AND HEMATOCRIT, BLOOD
HCT: 26 % — ABNORMAL LOW (ref 36.0–46.0)
Hemoglobin: 8.8 g/dL — ABNORMAL LOW (ref 12.0–15.0)

## 2023-03-15 LAB — URINE CULTURE: Culture: NO GROWTH

## 2023-03-15 LAB — GLUCOSE, CAPILLARY
Glucose-Capillary: 141 mg/dL — ABNORMAL HIGH (ref 70–99)
Glucose-Capillary: 167 mg/dL — ABNORMAL HIGH (ref 70–99)
Glucose-Capillary: 131 mg/dL — ABNORMAL HIGH (ref 70–99)

## 2023-03-15 MED ORDER — POLYETHYLENE GLYCOL 3350 17 G PO PACK: 17.0000 g | PACK | Freq: Every day | ORAL | Status: DC | PRN

## 2023-03-15 MED ORDER — TRAMADOL HCL 50 MG PO TABS: 50.0000 mg | ORAL_TABLET | Freq: Four times a day (QID) | ORAL | 0 refills | Status: DC | PRN

## 2023-03-15 MED ORDER — ALPRAZOLAM 1 MG PO TABS: 1.0000 mg | ORAL_TABLET | Freq: Every evening | ORAL | 0 refills | Status: DC | PRN

## 2023-03-15 MED ORDER — CEPHALEXIN 500 MG PO CAPS: 500.0000 mg | ORAL_CAPSULE | Freq: Two times a day (BID) | ORAL | 0 refills | Status: AC

## 2023-03-15 MED ORDER — CEFDINIR 300 MG PO CAPS: 300.0000 mg | ORAL_CAPSULE | Freq: Two times a day (BID) | ORAL | 0 refills | Status: DC

## 2023-03-15 MED ORDER — CHLORHEXIDINE GLUCONATE CLOTH 2 % EX PADS: 6.0000 | MEDICATED_PAD | Freq: Every day | CUTANEOUS | Status: DC

## 2023-03-15 NOTE — TOC Transition Note (Addendum)
Transition of Care [facility]) - CM/SW Discharge Note   Patient Details  Name: Tanya Ho MRN: [id number] Date of Birth: December 08, 1926  Transition of Care [facility]) CM/SW Contact:  Joanne Chars, LCSW Phone Number: 03/15/2023, 1:06 PM   Clinical Narrative:   Pt discharging to [facility]. RN call report to [phone]. (Main number: [phone])   0830: CSW confirmed with [facility] that they can receive pt today.   1345: transportation placed on hole, pt may need foley.  CSW confirmed with [facility] that they can manage foley if pt requires one.   [id number]: pt back in line for transportation.  Updated DC summary sent to [facility].    Final next level of care: Skilled Nursing Facility Barriers to Discharge: Barriers Resolved   Patient Goals and CMS Choice CMS Medicare.gov Compare Post Acute Care list provided to:: Patient Represenative (must comment) (daughter and son) Choice offered to / list presented to : Adult Children  Discharge Placement                Patient chooses bed at:  [facility]) Patient to be transferred to facility by: [facility] Name of family member notified: daughter [name] Patient and family notified of of transfer: 03/15/23  Discharge Plan and Services Additional resources added to the After Visit Summary for   In-house Referral: Clinical Social Work   Post Acute Care Choice: [facility]                               Social Determinants of Health (SDOH) Interventions SDOH Screenings   Food Insecurity: No Food Insecurity (03/12/2023)  Housing: Low Risk  (03/12/2023)  Transportation Needs: No Transportation Needs (03/12/2023)  Utilities: Not At Risk (03/12/2023)  Tobacco Use: Low Risk  (03/13/2023)     Readmission Risk Interventions     No data to display

## 2023-03-15 NOTE — Progress Notes (Signed)
Placed 16 F. 10cc Coude catheter with urine return using sterile technique assisted by Adelfa Koh, RN. Pt tolerated procedure well.

## 2023-03-15 NOTE — Plan of Care (Signed)
  Problem: Education: Goal: Ability to describe self-care measures that may prevent or decrease complications (Diabetes Survival Skills Education) will improve Outcome: Progressing Goal: Individualized Educational Video(s) Outcome: Progressing   Problem: Coping: Goal: Ability to adjust to condition or change in health will improve Outcome: Progressing   Problem: Fluid Volume: Goal: Ability to maintain a balanced intake and output will improve Outcome: Progressing   Problem: Health Behavior/Discharge Planning: Goal: Ability to identify and utilize available resources and services will improve Outcome: Progressing Goal: Ability to manage health-related needs will improve Outcome: Progressing   Problem: Metabolic: Goal: Ability to maintain appropriate glucose levels will improve Outcome: Progressing   Problem: Nutritional: Goal: Maintenance of adequate nutrition will improve Outcome: Progressing Goal: Progress toward achieving an optimal weight will improve Outcome: Progressing   Problem: Skin Integrity: Goal: Risk for impaired skin integrity will decrease Outcome: Progressing   Problem: Tissue Perfusion: Goal: Adequacy of tissue perfusion will improve Outcome: Progressing   Problem: Health Behavior/Discharge Planning: Goal: Ability to manage health-related needs will improve Outcome: Progressing   Problem: Clinical Measurements: Goal: Ability to maintain clinical measurements within normal limits will improve Outcome: Progressing Goal: Will remain free from infection Outcome: Progressing Goal: Diagnostic test results will improve Outcome: Progressing Goal: Respiratory complications will improve Outcome: Progressing Goal: Cardiovascular complication will be avoided Outcome: Progressing   Problem: Activity: Goal: Risk for activity intolerance will decrease Outcome: Not Progressing   Problem: Nutrition: Goal: Adequate nutrition will be maintained Outcome:  Progressing   Problem: Pain Managment: Goal: General experience of comfort will improve Outcome: Progressing   Problem: Safety: Goal: Ability to remain free from injury will improve Outcome: Progressing

## 2023-03-15 NOTE — Progress Notes (Signed)
Pt was retaining urine, intermittent craterization was successful without difficulty at 2355. Pt had an episode of incontinence afterwards. Bladder scan for this morning was 280 mls and she voided 120 mls, post void residual was 63 mls. We  will continue bladder scans to monitor pt's for any s/s of urine retention

## 2023-03-15 NOTE — Progress Notes (Signed)
Physical Therapy Treatment Patient Details Name: Tanya Ho MRN: [id number] DOB: 1926/12/08 Today's Date: 03/15/2023   History of Present Illness Patient is a 89 y/o female who presents on 3/24 with left intertrochanteric femur fx s/p fall now s/p IMN left femur 3/25. PMH includes CAD, HTN, obesity.    PT Comments    Pt was received in supine and agreeable to session. Pt continued to require heavy assist with all mobility tasks due to increased LLE pain with mobility. Pt able to sit EOB for ~ 8 mins with min guard progressing to intermittent min A to correct R lateral lean. Pt required assist with LLE positioning due to pt's inability mobilize it due to pain. Attempted to stand at EOB, however pt required total A +2 due to LLE pain, BLE weakness, and difficulty following commands. Pt was unable to extend knees to reach upright posture before needing to sit. Pt appearing slightly lethargic during session and reporting inability to focus due to fatigue and pain. Pt's SpO2 noted to drop to 80% after standing trial and quickly improving to 97% once supine. Pt continues to benefit from PT services to progress toward functional mobility goals.    Recommendations for follow up therapy are one component of a multi-disciplinary discharge planning process, led by the attending physician.  Recommendations may be updated based on patient status, additional functional criteria and insurance authorization.  Follow Up Recommendations  Can patient physically be transported by private vehicle: No    Assistance Recommended at Discharge Frequent or constant Supervision/Assistance  Patient can return home with the following Two people to help with walking and/or transfers;Help with stairs or ramp for entrance;Assist for transportation;Assistance with cooking/housework;Direct supervision/assist for medications management;Direct supervision/assist for financial management;A lot of help with bathing/dressing/bathroom    Equipment Recommendations  None recommended by PT    Recommendations for Other Services       Precautions / Restrictions Precautions Precautions: Fall Restrictions Weight Bearing Restrictions: Yes LLE Weight Bearing: Weight bearing as tolerated     Mobility  Bed Mobility Overal bed mobility: Needs Assistance Bed Mobility: Supine to Sit, Sit to Supine     Supine to sit: Max assist, +2 for physical assistance Sit to supine: Total assist, +2 for physical assistance   General bed mobility comments: Pt able to advance RLE, however required max A +2 for LLE advancement and trunk elevation. Pt required total A +2 to return to supine due to increased pain and fatigue.    Transfers Overall transfer level: Needs assistance Equipment used: Rolling walker (2 wheels), 2 person hand held assist Transfers: Sit to/from Stand Sit to Stand: Total assist, +2 physical assistance           General transfer comment: Pt required total A +2 to stand initially to RW and then with 2 HHA due to L hip pain and BLE weakness. Pt with limited ability to power up and unable to fully extend B knees in standing.       Balance Overall balance assessment: Needs assistance Sitting-balance support: Feet supported, Single extremity supported Sitting balance-Leahy Scale: Poor Sitting balance - Comments: Sitting EOB with pt demonstrating R lateral lean due to pain Postural control: Right lateral lean Standing balance support: Reliant on assistive device for balance Standing balance-Leahy Scale: Zero Standing balance comment: Total A +2 to stand due to pain and weakness  Cognition Arousal/Alertness: Lethargic Behavior During Therapy: Anxious Overall Cognitive Status: Impaired/Different from baseline                                 General Comments: Pt appearing confused and slightly lethargic during session stating "i can't focus on what you're  saying" and becoming distracted by pain.        Exercises      General Comments General comments (skin integrity, edema, etc.): Pt's SpO2 90% sitting EOB on RA and 94-97% on 2L. Pt's SpO2 dropped to 80% after standing trial and improved to 97% once returned to supine with cues for pursed lip breathing.      Pertinent Vitals/Pain Pain Assessment Pain Assessment: Faces Faces Pain Scale: Hurts whole lot Pain Location: LLE with movement Pain Descriptors / Indicators: Sore, Operative site guarding, Grimacing, Discomfort Pain Intervention(s): Limited activity within patient's tolerance, Monitored during session, Repositioned     PT Goals (current goals can now be found in the care plan section) Acute Rehab PT Goals Patient Stated Goal: none stated, decrease pain PT Goal Formulation: With patient Time For Goal Achievement: 03/27/23 Potential to Achieve Goals: Fair Progress towards PT goals: Progressing toward goals (slowly)    Frequency    Min 3X/week      PT Plan Current plan remains appropriate    Co-evaluation PT/OT/SLP Co-Evaluation/Treatment: Yes Reason for Co-Treatment: Complexity of the patient's impairments (multi-system involvement);For patient/therapist safety;To address functional/ADL transfers PT goals addressed during session: Mobility/safety with mobility;Balance;Proper use of DME OT goals addressed during session: ADL's and self-care;Other (comment) (bed mobility, standing tolerance)      AM-PAC PT "6 Clicks" Mobility   Outcome Measure  Help needed turning from your back to your side while in a flat bed without using bedrails?: Total Help needed moving from lying on your back to sitting on the side of a flat bed without using bedrails?: Total Help needed moving to and from a bed to a chair (including a wheelchair)?: Total Help needed standing up from a chair using your arms (e.g., wheelchair or bedside chair)?: Total Help needed to walk in hospital room?:  Total Help needed climbing 3-5 steps with a railing? : Total 6 Click Score: 6    End of Session Equipment Utilized During Treatment: Oxygen;Gait belt Activity Tolerance: Patient limited by pain;Patient limited by fatigue;Patient limited by lethargy Patient left: in bed;with call bell/phone within reach;with bed alarm set Nurse Communication: Mobility status (O2 and confusion) PT Visit Diagnosis: Pain;Difficulty in walking, not elsewhere classified (R26.2);Muscle weakness (generalized) (M62.81)     Time: [id number] PT Time Calculation (min) (ACUTE ONLY): 23 min  Charges:  $Therapeutic Activity: 8-22 mins                     Michelle Nasuti, PTA Acute Rehabilitation Services Secure Chat Preferred  [phone]    Michelle Nasuti 03/15/2023, 11:09 AM

## 2023-03-15 NOTE — Progress Notes (Addendum)
Occupational Therapy Treatment Patient Details Name: Tanya Ho MRN: [id number] DOB: 1926/12/07 Today's Date: 03/15/2023   History of present illness Patient is a 89 y/o female who presents on 3/24 with left intertrochanteric femur fx s/p fall now s/p IMN left femur 3/25. PMH includes CAD, HTN, obesity.   OT comments  Pt limited in therapy due to pain and fatigue, requiring total A x2 for bed mobility and standing with RW today. Pt unable to follow 1-step directions consistently <50%, BO 90 with room air, O2 canula was tied to bedrail upon entry, BO increased to 97 at end of session on 2L O2.  Pt DC recommendation remains appropriate, would benefit greatly from continued skilled therapy to improve strength and functional independence.    Recommendations for follow up therapy are one component of a multi-disciplinary discharge planning process, led by the attending physician.  Recommendations may be updated based on patient status, additional functional criteria and insurance authorization.    Assistance Recommended at Discharge Frequent or constant Supervision/Assistance  Patient can return home with the following  A lot of help with walking and/or transfers;A lot of help with bathing/dressing/bathroom;Assistance with cooking/housework;Assist for transportation;Help with stairs or ramp for entrance   Equipment Recommendations  Tub/shower seat    Recommendations for Other Services      Precautions / Restrictions Precautions Precautions: Fall Restrictions Weight Bearing Restrictions: Yes LLE Weight Bearing: Weight bearing as tolerated       Mobility Bed Mobility Overal bed mobility: Needs Assistance Bed Mobility: Supine to Sit, Sit to Supine     Supine to sit: Max assist, +2 for physical assistance Sit to supine: Total assist, +2 for physical assistance   General bed mobility comments: Pt limited by pain and weakness, able to move RLE some, unable to assist with LLE.     Transfers Overall transfer level: Needs assistance Equipment used: Rolling walker (2 wheels), 2 person hand held assist Transfers: Sit to/from Stand Sit to Stand: Total assist, +2 physical assistance                 Balance Overall balance assessment: Needs assistance Sitting-balance support: Feet supported, Single extremity supported Sitting balance-Leahy Scale: Poor Sitting balance - Comments: min guard, leans towards R with LLE extended due to pain   Standing balance support: Reliant on assistive device for balance Standing balance-Leahy Scale: Poor                             ADL either performed or assessed with clinical judgement   ADL Overall ADL's : Needs assistance/impaired Eating/Feeding: Set up   Grooming: Set up                   Toilet Transfer: Total assistance;+2 for physical assistance;Rolling walker (2 wheels)       Tub/ Shower Transfer: Total assistance;+2 for physical assistance;Rolling walker (2 wheels)          Extremity/Trunk Assessment Upper Extremity Assessment Upper Extremity Assessment: Generalized weakness   Lower Extremity Assessment Lower Extremity Assessment: Defer to PT evaluation        Vision       Perception     Praxis      Cognition Arousal/Alertness: Lethargic (confusion) Behavior During Therapy: Anxious Overall Cognitive Status: Impaired/Different from baseline Area of Impairment: Memory, Orientation, Following commands, Safety/judgement, Problem solving                 Orientation  Level: Disoriented to, Situation   Memory: Decreased short-term memory Following Commands: Follows one step commands inconsistently Safety/Judgement: Decreased awareness of deficits, Decreased awareness of safety   Problem Solving: Slow processing, Decreased initiation, Difficulty sequencing, Requires verbal cues, Requires tactile cues General Comments: confused        Exercises      Shoulder  Instructions       General Comments Pt's SpO2 90% sitting EOB on RA and 94-97% on 2L. Pt's SpO2 dropped to 80% after standing trial and improved to 97% once returned to supine with cues for pursed lip breathing.    Pertinent Vitals/ Pain       Pain Assessment Pain Assessment: Faces Faces Pain Scale: Hurts whole lot  Home Living                                          Prior Functioning/Environment              Frequency  Min 2X/week        Progress Toward Goals  OT Goals(current goals can now be found in the care plan section)  Progress towards OT goals: Progressing toward goals  Acute Rehab OT Goals Patient Stated Goal: Pt in confused state, focus on pain management OT Goal Formulation: With patient Time For Goal Achievement: 03/27/23 Potential to Achieve Goals: Fair ADL Goals Pt Will Perform Lower Body Dressing: with mod assist;with adaptive equipment;sitting/lateral leans Pt Will Transfer to Toilet: with max assist Pt Will Perform Toileting - Clothing Manipulation and hygiene: with max assist;sit to/from stand Additional ADL Goal #1: Pt will be able to perform general bed mobility with mod A to prepare for transfers to improve functional independence  Plan Discharge plan remains appropriate    Co-evaluation    PT/OT/SLP Co-Evaluation/Treatment: Yes Reason for Co-Treatment: Complexity of the patient's impairments (multi-system involvement);For patient/therapist safety;To address functional/ADL transfers PT goals addressed during session: Mobility/safety with mobility;Balance;Proper use of DME OT goals addressed during session: ADL's and self-care;Other (comment) (bed mobility, standing tolerance)      AM-PAC OT "6 Clicks" Daily Activity     Outcome Measure   Help from another person eating meals?: A Little Help from another person taking care of personal grooming?: A Little Help from another person toileting, which includes using toliet,  bedpan, or urinal?: A Lot Help from another person bathing (including washing, rinsing, drying)?: A Lot Help from another person to put on and taking off regular upper body clothing?: A Little Help from another person to put on and taking off regular lower body clothing?: Total 6 Click Score: 14    End of Session Equipment Utilized During Treatment: Gait belt;Rolling walker (2 wheels);Oxygen  OT Visit Diagnosis: Unsteadiness on feet (R26.81);Other abnormalities of gait and mobility (R26.89);Muscle weakness (generalized) (M62.81);History of falling (Z91.81);Pain;Other symptoms and signs involving cognitive function Pain - Right/Left: Left Pain - part of body: Leg   Activity Tolerance Patient limited by pain;Patient limited by fatigue   Patient Left in bed;with call bell/phone within reach;with bed alarm set   Nurse Communication Mobility status;Weight bearing status        Time: [id number] OT Time Calculation (min): 20 min  Charges: OT General Charges $OT Visit: 1 Visit OT Treatments $Therapeutic Activity: 8-22 mins  [place] 03/15/2023, 11:14 AM

## 2023-03-15 NOTE — Discharge Summary (Addendum)
Physician Discharge Summary   Patient: Tanya Ho MRN: [id number] DOB: 04-22-1926  Admit date:     03/11/2023  Discharge date: 03/15/23  Discharge Physician: Cordelia Poche, MD   PCP: Shon Baton, MD   Recommendations at discharge:  Follow-up with PCP and orthopedic surgeon Repeat CBC in 3 days Follow-up urine culture data Voiding trial in 5-7 days  Discharge Diagnoses: Principal Problem:   Closed left hip fracture, initial encounter [facility]) Active Problems:   Coronary artery disease   Hypertension   Permanent atrial fibrillation (HCC)   Stage 3a chronic kidney disease (CKD) (HCC)   Chronic heart failure with preserved ejection fraction (HFpEF) ([place])   Hyperglycemia   Dysuria   Acute urinary retention  Resolved Problems:   * No resolved hospital problems. *  Hospital Course: Tanya Ho is a 89 y.o. female with hypertension, nonobstructive CAD, chronic heart failure with preserved EF, permanent atrial fibrillation on Eliquis.  Patient presented secondary to a fall without syncope and subsequent left hip pain.  Patient was found to have comminuted impacted fracture of the left proximal femur.  Orthopedic surgery consulted and performed cephalomedullary nail placement on 3/25 with recommendations for weightbearing as tolerated to the left lower extremity.  Assessment and Plan:  Left hip fracture Secondary to fall without syncope. Patient underwent successful left cephalomedullary nailing of her femur fracture on 3/25. Postop orthopedic surgery recommendations for weightbearing as tolerated to the LLE. Patient worked with PT/OT and will plan for discharge to SNF.   Permanent atrial fibrillation Patient follows with EP as an outpatient. She is managed on Eliquis and metoprolol. Rate controlled. Continue Eliquis and metoprolol.   Postoperative anemia Acute blood loss anemia Baseline hemoglobin of 12 to 13.  Likely secondary to perioperative blood loss, although EBL documented as  20 mL.  Hemoglobin trending downward to 8.8 and is stable.   Severe asymptomatic hypertension Blood pressure as high as 184/93 on admission, possibly secondary to acute pain. Blood pressure improved during hospitalization.   Dysuria Possible urinary infection. Urinalysis obtained and is not highly suggestive of a UTI. Urine culture obtained and is pending. Patient started empirically started on Ceftriaxone and transitioned to Keflex on discharge.  Acute urinary retention Likely related to decreased mobility and possible UTI. Foley catheter placed on 3/28. Recommendation for voiding trial in 5-7 days, and if failed, follow-up with urology as an outpatient.   Chronic HFpEF Euvolemic.   CAD Patient is not on aspirin or Plavix, but is on Eliquis. Continue metoprolol.   Diabetes mellitus type 2 Hemoglobin A1C of 6.9%   CKD stage IIIa Stable.   Hypokalemia Resolved with supplementation   Consultants: Orthopedic surgery Procedures performed:  3/25: Cephalomedullary nailing of left intertrochanteric femur fracture   Disposition: Skilled nursing facility Diet recommendation: Carb modified diet   DISCHARGE MEDICATION: Allergies as of 03/15/2023       Reactions   Codeine Other (See Comments)   Unknown reaction   Demerol [meperidine Hcl] Other (See Comments)   Unknown reaction   Morphine And Related Other (See Comments)   Unknown reaction        Medication List     TAKE these medications    acetaminophen 325 MG tablet Commonly known as: TYLENOL Take 1-2 tablets (325-650 mg total) by mouth every 6 (six) hours as needed for mild pain, fever or headache.   ALPRAZolam 1 MG tablet Commonly known as: XANAX Take 1 tablet (1 mg total) by mouth at bedtime as needed for anxiety  or sleep.   apixaban 5 MG Tabs tablet Commonly known as: Eliquis Take 1 tablet (5 mg total) by mouth 2 (two) times daily.   cephALEXin 500 MG capsule Commonly known as: Keflex Take 1 capsule (500  mg total) by mouth 2 (two) times daily for 4 days.   ergocalciferol 1.25 MG (50000 UT) capsule Commonly known as: VITAMIN D2 Take 50,000 Units by mouth once a week.   famotidine 20 MG tablet Commonly known as: PEPCID Take 20 mg by mouth 2 (two) times daily.   furosemide 20 MG tablet Commonly known as: LASIX TAKE 1 TABLET EVERY DAY What changed:  when to take this reasons to take this   metoprolol tartrate 100 MG tablet Commonly known as: LOPRESSOR Take 1 tablet (100 mg total) by mouth 2 (two) times daily. You may take an additional 1/2 tablet (50 mg total) by mouth daily as needed for palpitations. What changed:  when to take this additional instructions   nitroGLYCERIN 0.4 MG SL tablet Commonly known as: NITROSTAT Place 1 tablet (0.4 mg total) under the tongue every 5 (five) minutes as needed for chest pain.   polyethylene glycol 17 g packet Commonly known as: MIRALAX / GLYCOLAX Take 17 g by mouth daily as needed for mild constipation.   traMADol 50 MG tablet Commonly known as: ULTRAM Take 1-2 tablets (50-100 mg total) by mouth every 6 (six) hours as needed for severe pain or moderate pain. What changed: See the new instructions.        Contact information for follow-up providers     Haddix, Thomasene Lot, MD. Schedule an appointment as soon as possible for a visit in 2 week(s).   Specialty: Orthopedic Surgery Why: for wound check and repeat x-rays Contact information: [place] [address] [phone]              Contact information for after-discharge care     Destination     [facility] .   Service: Skilled Nursing Contact information: [place] [phone]                    Discharge Exam: BP (!) 141/68 (BP Location: Right Arm)   Pulse 75   Temp 98.4 F (36.9 C) (Oral)   Resp 16   Ht 5\' 6"  (1.676 m)   Wt 80 kg   SpO2 91%   BMI 28.47 kg/m   General  exam: Appears calm and comfortable Respiratory system: Clear to auscultation. Respiratory effort normal. Cardiovascular system: S1 & S2 heard, RRR. Gastrointestinal system: Abdomen is nondistended, soft and nontender. Normal bowel sounds heard. Central nervous system: Alert and oriented. Psychiatry: Judgement and insight appear normal. Mood & affect appropriate.   Condition at discharge: stable  The results of significant diagnostics from this hospitalization (including imaging, microbiology, ancillary and laboratory) are listed below for reference.   Imaging Studies: DG FEMUR PORT MIN 2 VIEWS LEFT  Result Date: 03/12/2023 CLINICAL DATA:  Post intramedullary nail left femur EXAM: LEFT FEMUR PORTABLE 2 VIEWS COMPARISON:  02/11/2023 FINDINGS: Status post intramedullary nail fixation of intratrochanteric fractures of the left femur, with mild persistent displacement fracture fragments. No evidence of perihardware fracture or distal femoral fracture. Vascular calcinosis. IMPRESSION: Status post intramedullary nail fixation of intratrochanteric fractures of the left femur, with mild persistent displacement fracture fragments. No evidence of perihardware fracture or distal femoral fracture. Electronically Signed   By: Jamse Mead.D.  On: 03/12/2023 16:52   DG FEMUR MIN 2 VIEWS LEFT  Result Date: 03/12/2023 CLINICAL DATA:  Elective surgery EXAM: LEFT FEMUR 2 VIEWS COMPARISON:  None Available. FINDINGS: Seven intraprocedural fluoroscopic images show intramedullary nail fixation of proximal femur fracture with distal interlocking screw. No unexpected finding. Fracture alignment is anatomic. IMPRESSION: Fluoroscopy for femur fracture fixation.  No unexpected finding. Electronically Signed   By: Jorje Guild M.D.   On: 03/12/2023 15:06   DG C-Arm 1-60 Min-No Report  Result Date: 03/12/2023 Fluoroscopy was utilized by the requesting physician.  No radiographic interpretation.   DG FEMUR PORT MIN  2 VIEWS LEFT  Result Date: 03/12/2023 CLINICAL DATA:  Femoral fracture. EXAM: LEFT FEMUR PORTABLE 2 VIEWS COMPARISON:  None Available. FINDINGS: There is a comminuted impacted fracture of the left proximal femur, slightly inferior to the greater trochanter and involving the lesser trochanter. Butterfly fragment is seen medially. The left hip joint is normally located. IMPRESSION: Comminuted impacted fracture of the left proximal femur, slightly inferior to the greater trochanter and involving the lesser trochanter. Electronically Signed   By: Fidela Salisbury M.D.   On: 03/12/2023 08:23   DG Chest Portable 1 View  Result Date: 03/12/2023 CLINICAL DATA:  Recent fall with known left hip fracture EXAM: PORTABLE CHEST 1 VIEW COMPARISON:  06/02/2015 FINDINGS: Cardiac shadow is enlarged. Aortic calcifications are seen. Mild central vascular congestion is noted without edema. No focal infiltrate is seen. No bony abnormality is noted. IMPRESSION: Mild central vascular congestion. Electronically Signed   By: Inez Catalina M.D.   On: 03/12/2023 00:32   DG Hip Unilat W or Wo Pelvis 2-3 Views Left  Result Date: 03/12/2023 CLINICAL DATA:  Recent trip and fall with left hip pain, initial encounter EXAM: DG HIP (WITH OR WITHOUT PELVIS) 2-V LEFT COMPARISON:  None Available. FINDINGS: Comminuted proximal left intratrochanteric femoral fracture is noted with impaction and angulation at the fracture site. Pelvic ring appears intact. Vascular calcifications are noted. IMPRESSION: Left intratrochanteric femoral fracture. Electronically Signed   By: Inez Catalina M.D.   On: 03/12/2023 00:31   CT Head Wo Contrast  Result Date: 03/12/2023 CLINICAL DATA:  Recent trip and fall with headaches, initial encounter EXAM: CT HEAD WITHOUT CONTRAST TECHNIQUE: Contiguous axial images were obtained from the base of the skull through the vertex without intravenous contrast. RADIATION DOSE REDUCTION: This exam was performed according to  the departmental dose-optimization program which includes automated exposure control, adjustment of the mA and/or kV according to patient size and/or use of iterative reconstruction technique. COMPARISON:  None Available. FINDINGS: Brain: No evidence of acute infarction, hemorrhage, hydrocephalus, extra-axial collection or mass lesion/mass effect. Chronic atrophic and ischemic changes are noted. Vascular: No hyperdense vessel or unexpected calcification. Skull: Normal. Negative for fracture or focal lesion. Sinuses/Orbits: No acute finding. Other: None. IMPRESSION: Chronic atrophic and ischemic changes without acute abnormality. Electronically Signed   By: Inez Catalina M.D.   On: 03/12/2023 00:30    Microbiology: Results for orders placed or performed during the hospital encounter of 03/11/23  Urine Culture     Status: None (Preliminary result)   Collection Time: 03/15/23 12:15 AM   Specimen: Urine, Catheterized  Result Value Ref Range Status   Specimen Description URINE, CATHETERIZED  Final   Special Requests   Final    NONE Reflexed from [id number] Performed at [facility], [address] [place], [place] [address]    Culture PENDING  Incomplete   Report Status PENDING  Incomplete    Labs: CBC: Recent Labs  Lab 03/11/23 2357 03/12/23 0425 03/13/23 0156 03/14/23 0217 03/15/23 0242 03/15/23 0926  WBC 14.8* 16.4* 14.5* 15.7* 13.9*  --   NEUTROABS 11.5*  --   --   --   --   --   HGB 13.3 12.9 10.3* 9.1* 8.8* 8.8*  HCT 39.7 38.9 31.7* 27.9* 26.0* 26.0*  MCV 93.2 92.6 94.1 94.3 93.2  --   PLT 235 228 232 218 250  --    Basic Metabolic Panel: Recent Labs  Lab 03/11/23 2357 03/12/23 0425 03/13/23 0156 03/14/23 0217  NA 136 136 134* 133*  K 3.8 3.4* 3.9 4.2  CL 102 103 96* 95*  CO2 26 25 24 27   GLUCOSE 209* 187* 161* 169*  BUN 14 14 20  27*  CREATININE 1.02* 0.93 1.25* 1.03*  CALCIUM 8.8* 8.8* 8.7* 8.5*   Liver Function Tests: Recent Labs  Lab 03/13/23 0156  03/14/23 0217  AST 29 20  ALT 13 10  ALKPHOS 40 40  BILITOT 0.4 0.8  PROT 5.8* 5.7*  ALBUMIN 2.8* 2.7*   CBG: Recent Labs  Lab 03/14/23 1134 03/14/23 1628 03/14/23 1937 03/15/23 0819 03/15/23 1122  GLUCAP 172* 120* 128* 141* 167*    Discharge time spent: 35 minutes.  Signed: Cordelia Poche, MD [facility] 03/15/2023

## 2023-03-15 NOTE — Care Management Important Message (Signed)
Important Message  Patient Details  Name: Tanya Ho MRN: [id number] Date of Birth: September 13, 1926   Medicare Important Message Given:  Yes     Hannah Beat 03/15/2023, 3:51 PM

## 2023-03-19 ENCOUNTER — Encounter: Payer: Self-pay | Admitting: Student

## 2023-03-19 ENCOUNTER — Non-Acute Institutional Stay (SKILLED_NURSING_FACILITY): Payer: Medicare Other | Admitting: Student

## 2023-03-19 DIAGNOSIS — F1320 Sedative, hypnotic or anxiolytic dependence, uncomplicated: Secondary | ICD-10-CM | POA: Diagnosis not present

## 2023-03-19 DIAGNOSIS — S72002A Fracture of unspecified part of neck of left femur, initial encounter for closed fracture: Secondary | ICD-10-CM | POA: Diagnosis not present

## 2023-03-19 DIAGNOSIS — I5032 Chronic diastolic (congestive) heart failure: Secondary | ICD-10-CM | POA: Diagnosis not present

## 2023-03-19 DIAGNOSIS — I4821 Permanent atrial fibrillation: Secondary | ICD-10-CM

## 2023-03-19 DIAGNOSIS — N1831 Chronic kidney disease, stage 3a: Secondary | ICD-10-CM

## 2023-03-19 DIAGNOSIS — I2510 Atherosclerotic heart disease of native coronary artery without angina pectoris: Secondary | ICD-10-CM

## 2023-03-19 DIAGNOSIS — R440 Auditory hallucinations: Secondary | ICD-10-CM

## 2023-03-19 DIAGNOSIS — I10 Essential (primary) hypertension: Secondary | ICD-10-CM

## 2023-03-19 MED ORDER — TRAMADOL HCL 50 MG PO TABS: 50.0000 mg | ORAL_TABLET | Freq: Four times a day (QID) | ORAL | 0 refills | Status: DC | PRN

## 2023-03-19 MED ORDER — ALPRAZOLAM 1 MG PO TABS: 1.0000 mg | ORAL_TABLET | Freq: Every evening | ORAL | 0 refills | Status: DC | PRN

## 2023-03-19 NOTE — Progress Notes (Unsigned)
Provider:  Dr. Dewayne Shorter Location:  [facility]  Nursing Home Room Number: [place] of Service:  SNF (31)  PCP: Shon Baton, MD Patient Care Team: Shon Baton, MD as PCP - General (Internal Medicine) Freada Bergeron, MD as PCP - Cardiology (Cardiology) Constance Haw, MD as PCP - Electrophysiology (Cardiology)  Extended Emergency Contact Information Primary Emergency Contact: [place] Phone: [phone] Relation: Daughter Secondary Emergency Contact: [place] Mobile Phone: [phone] Relation: Son  Code Status: DNAR Goals of Care: Advanced Directive information    03/19/2023    9:06 AM  Advanced Directives  Does Patient Have a Medical Advance Directive? No  Would patient like information on creating a medical advance directive? No - Patient declined    Chief Complaint  Patient presents with   New Admit To SNF    Admission     HPI: Patient is a 89 y.o. female seen today for admission to [facility]   The day she fell she was about to go to bed and went to look out the window. She left the walker in front of the dresser about 6 ft away. All of a sudden she turned her walker around and she started falling. She slid somewhat under the bed. She called her son who helped call the ambulance to get her up.   She had a nail placed in the hip in the hospital. She had some postoperative anemia.   Her pain is wlel-controlled, but she doesn't feel like  She had a bowel movement in the hospital, but not since being here. (Saturday or Friday night).   She is oriented to self and place a a rehab center. She says it is October 2024.   She lives alone, and got lonesome at times. She would get some calls periodically. She would fix her food. She has been alone since the passing of her husband around passed away 8-9 years ago. She was driving before, but her license has now expired and she isn't going to renew  them. She has two children who live nearby. She has not been eating well in the last few weeks. She hasn't been feeling hungry. She has some candy or cookies and a cup of coffee. She has lost weight, but she was trying to because the cardiologist said she was too heavy. She uses a walker to go outside, but doesn't always use it in her home. She typically prepares meals for herself. Her daughter will help get the groceries for her. She pays her bills and writes her own checks. She had a job in [employer] for 5 years. Previously before marriage she was at [employer].   She has been on a sleeping medication since she was 27 and raising children. Xanax nightly for 15 years. She has been addicted for a long time per her daughter, [name].   She doesn't think there would be much of a reason to try to bring her back if she no longer had a heart beat. She has never discussed this before.   Spoke with her daughter who states she has had some slight memory changes over the last few weeks. She hears and has heard music playing and she hears it all day. Somedays it is annoying to her. For the last year in the middle of the night she hears someone banging on her house or like someone is banging on the wall-- which is what happened when she fell.  One day she says she heard her dad and another day some children talking to her. Ms. Cardo was up at night thinking that if the night light in her room went out she would die, so she would pray that it wouldn't go off. She has never been sickly so being away from home for her is traumatic.   Past Medical History:  Diagnosis Date   Back pain    Bruises easily    Coronary artery disease    NONOBSTRUCTIVE   Fatigue    Hyperlipidemia    Hypertension    Knee pain    OA (osteoarthritis)    Obesity    Palpitations    SOB (shortness of breath)    Past Surgical History:  Procedure Laterality Date   BREAST BIOPSY     LEFT BREAST   CARDIAC CATHETERIZATION  05/11/2006    OVERALL CARDIAC SIZE AND SILHOUETTE ARE NORMAL. EF ESTIMATED 60%   INTRAMEDULLARY (IM) NAIL INTERTROCHANTERIC Left 03/12/2023   Procedure: INTRAMEDULLARY (IM) NAIL INTERTROCHANTERIC;  Surgeon: Shona Needles, MD;  Location: [facility];  Service: Orthopedics;  Laterality: Left;   KIDNEY STONES  1982   KNEE ARTHROSCOPY  09/22/2008   VAGINAL HYSTERECTOMY      reports that she has never smoked. She has never used smokeless tobacco. She reports that she does not drink alcohol and does not use drugs. Social History   Socioeconomic History   Marital status: Widowed    Spouse name: Not on file   Number of children: Not on file   Years of education: Not on file   Highest education level: Not on file  Occupational History   Not on file  Tobacco Use   Smoking status: Never   Smokeless tobacco: Never  Vaping Use   Vaping Use: Never used  Substance and Sexual Activity   Alcohol use: No   Drug use: No   Sexual activity: Not Currently  Other Topics Concern   Not on file  Social History Narrative   Not on file   Social Determinants of Health   Financial Resource Strain: Not on file  Food Insecurity: No Food Insecurity (03/12/2023)   Hunger Vital Sign    Worried About Running Out of Food in the Last Year: Never true    Ran Out of Food in the Last Year: Never true  Transportation Needs: No Transportation Needs (03/12/2023)   PRAPARE - Hydrologist (Medical): No    Lack of Transportation (Non-Medical): No  Physical Activity: Not on file  Stress: Not on file  Social Connections: Not on file  Intimate Partner Violence: Not At Risk (03/12/2023)   Humiliation, Afraid, Rape, and Kick questionnaire    Fear of Current or Ex-Partner: No    Emotionally Abused: No    Physically Abused: No    Sexually Abused: No    Functional Status Survey:    Family History  Problem Relation Age of Onset   Heart disease Mother    Stroke Mother    Heart attack Father     Health  Maintenance  Topic Date Due   DTaP/Tdap/Td (1 - Tdap) Never done   Zoster Vaccines- Shingrix (1 of 2) Never done   Pneumonia Vaccine 35+ Years old (1 of 1 - PCV) Never done   DEXA SCAN  Never done   Medicare Annual Wellness (AWV)  10/04/2021   COVID-19 Vaccine (4 - 2023-24 season) 08/18/2022   INFLUENZA VACCINE  07/19/2023  HPV VACCINES  Aged Out    Allergies  Allergen Reactions   Codeine Other (See Comments)    Unknown reaction   Demerol [Meperidine Hcl] Other (See Comments)    Unknown reaction   Morphine And Related Other (See Comments)    Unknown reaction    Outpatient Encounter Medications as of 03/19/2023  Medication Sig   acetaminophen (TYLENOL) 325 MG tablet Take 650 mg by mouth every 6 (six) hours as needed.   ALPRAZolam (XANAX) 1 MG tablet Take 1 tablet (1 mg total) by mouth at bedtime as needed for anxiety or sleep.   apixaban (ELIQUIS) 5 MG TABS tablet Take 1 tablet (5 mg total) by mouth 2 (two) times daily.   cephALEXin (KEFLEX) 500 MG capsule Take 1 capsule (500 mg total) by mouth 2 (two) times daily for 4 days.   ergocalciferol (VITAMIN D2) 50000 UNITS capsule Take 50,000 Units by mouth once a week.   famotidine (PEPCID) 20 MG tablet Take 20 mg by mouth 2 (two) times daily.   furosemide (LASIX) 20 MG tablet TAKE 1 TABLET EVERY DAY   Infant Care Products (DERMACLOUD EX) Apply to buttocks topically every shift.   metoprolol tartrate (LOPRESSOR) 100 MG tablet Take 1 tablet (100 mg total) by mouth 2 (two) times daily. You may take an additional 1/2 tablet (50 mg total) by mouth daily as needed for palpitations.   nitroGLYCERIN (NITROSTAT) 0.4 MG SL tablet Place 1 tablet (0.4 mg total) under the tongue every 5 (five) minutes as needed for chest pain.   ondansetron (ZOFRAN) 4 MG tablet Take 4 mg by mouth every 8 (eight) hours as needed for nausea or vomiting.   polyethylene glycol (MIRALAX / GLYCOLAX) 17 g packet Take 17 g by mouth daily as needed for mild constipation.    traMADol (ULTRAM) 50 MG tablet Take 1-2 tablets (50-100 mg total) by mouth every 6 (six) hours as needed for severe pain or moderate pain.   [DISCONTINUED] acetaminophen (TYLENOL) 325 MG tablet Take 1-2 tablets (325-650 mg total) by mouth every 6 (six) hours as needed for mild pain, fever or headache. (Patient taking differently: Take 650 mg by mouth every 6 (six) hours as needed for mild pain, fever or headache.)   No facility-administered encounter medications on file as of 03/19/2023.    Review of Systems  Vitals:   03/19/23 0859  BP: 123/73  Pulse: 60  Resp: 18  Temp: (!) 97.3 F (36.3 C)  SpO2: 93%  Weight: 179 lb 8 oz (81.4 kg)  Height: 5\' 6"  (1.676 m)   Body mass index is 28.97 kg/m. Physical Exam Constitutional:      Appearance: Normal appearance.  Cardiovascular:     Rate and Rhythm: Normal rate.     Pulses: Normal pulses.  Pulmonary:     Effort: Pulmonary effort is normal.     Breath sounds: Normal breath sounds.     Comments: 2lNC Abdominal:     General: Abdomen is flat.     Palpations: Abdomen is soft.  Musculoskeletal:     Comments: Left leg wound clean/dry/intact. Slight bruising of the left thigh.   Skin:    General: Skin is warm and dry.  Neurological:     Mental Status: She is alert and oriented to person, place, and time.     Labs reviewed: Basic Metabolic Panel: Recent Labs    03/12/23 0425 03/13/23 0156 03/14/23 0217  NA 136 134* 133*  K 3.4* 3.9 4.2  CL 103 96*  95*  CO2 25 24 27   GLUCOSE 187* 161* 169*  BUN 14 20 27*  CREATININE 0.93 1.25* 1.03*  CALCIUM 8.8* 8.7* 8.5*   Liver Function Tests: Recent Labs    03/13/23 0156 03/14/23 0217  AST 29 20  ALT 13 10  ALKPHOS 40 40  BILITOT 0.4 0.8  PROT 5.8* 5.7*  ALBUMIN 2.8* 2.7*   No results for input(s): "LIPASE", "AMYLASE" in the last 8760 hours. No results for input(s): "AMMONIA" in the last 8760 hours. CBC: Recent Labs    03/11/23 2357 03/12/23 0425 03/13/23 0156  03/14/23 0217 03/15/23 0242 03/15/23 0926  WBC 14.8*   < > 14.5* 15.7* 13.9*  --   NEUTROABS 11.5*  --   --   --   --   --   HGB 13.3   < > 10.3* 9.1* 8.8* 8.8*  HCT 39.7   < > 31.7* 27.9* 26.0* 26.0*  MCV 93.2   < > 94.1 94.3 93.2  --   PLT 235   < > 232 218 250  --    < > = values in this interval not displayed.   Cardiac Enzymes: No results for input(s): "CKTOTAL", "CKMB", "CKMBINDEX", "TROPONINI" in the last 8760 hours. BNP: Invalid input(s): "POCBNP" Lab Results  Component Value Date   HGBA1C 6.9 (H) 03/12/2023   Lab Results  Component Value Date   TSH 2.920 03/04/2018   No results found for: "VITAMINB12" No results found for: "FOLATE" No results found for: "IRON", "TIBC", "FERRITIN"  Imaging and Procedures obtained prior to SNF admission: DG FEMUR PORT MIN 2 VIEWS LEFT  Result Date: 03/12/2023 CLINICAL DATA:  Post intramedullary nail left femur EXAM: LEFT FEMUR PORTABLE 2 VIEWS COMPARISON:  02/11/2023 FINDINGS: Status post intramedullary nail fixation of intratrochanteric fractures of the left femur, with mild persistent displacement fracture fragments. No evidence of perihardware fracture or distal femoral fracture. Vascular calcinosis. IMPRESSION: Status post intramedullary nail fixation of intratrochanteric fractures of the left femur, with mild persistent displacement fracture fragments. No evidence of perihardware fracture or distal femoral fracture. Electronically Signed   By: Delanna Ahmadi M.D.   On: 03/12/2023 16:52   DG FEMUR MIN 2 VIEWS LEFT  Result Date: 03/12/2023 CLINICAL DATA:  Elective surgery EXAM: LEFT FEMUR 2 VIEWS COMPARISON:  None Available. FINDINGS: Seven intraprocedural fluoroscopic images show intramedullary nail fixation of proximal femur fracture with distal interlocking screw. No unexpected finding. Fracture alignment is anatomic. IMPRESSION: Fluoroscopy for femur fracture fixation.  No unexpected finding. Electronically Signed   By: Jorje Guild  M.D.   On: 03/12/2023 15:06   DG C-Arm 1-60 Min-No Report  Result Date: 03/12/2023 Fluoroscopy was utilized by the requesting physician.  No radiographic interpretation.   DG FEMUR PORT MIN 2 VIEWS LEFT  Result Date: 03/12/2023 CLINICAL DATA:  Femoral fracture. EXAM: LEFT FEMUR PORTABLE 2 VIEWS COMPARISON:  None Available. FINDINGS: There is a comminuted impacted fracture of the left proximal femur, slightly inferior to the greater trochanter and involving the lesser trochanter. Butterfly fragment is seen medially. The left hip joint is normally located. IMPRESSION: Comminuted impacted fracture of the left proximal femur, slightly inferior to the greater trochanter and involving the lesser trochanter. Electronically Signed   By: Fidela Salisbury M.D.   On: 03/12/2023 08:23   DG Chest Portable 1 View  Result Date: 03/12/2023 CLINICAL DATA:  Recent fall with known left hip fracture EXAM: PORTABLE CHEST 1 VIEW COMPARISON:  06/02/2015 FINDINGS: Cardiac shadow is enlarged. Aortic calcifications  are seen. Mild central vascular congestion is noted without edema. No focal infiltrate is seen. No bony abnormality is noted. IMPRESSION: Mild central vascular congestion. Electronically Signed   By: Inez Catalina M.D.   On: 03/12/2023 00:32   DG Hip Unilat W or Wo Pelvis 2-3 Views Left  Result Date: 03/12/2023 CLINICAL DATA:  Recent trip and fall with left hip pain, initial encounter EXAM: DG HIP (WITH OR WITHOUT PELVIS) 2-V LEFT COMPARISON:  None Available. FINDINGS: Comminuted proximal left intratrochanteric femoral fracture is noted with impaction and angulation at the fracture site. Pelvic ring appears intact. Vascular calcifications are noted. IMPRESSION: Left intratrochanteric femoral fracture. Electronically Signed   By: Inez Catalina M.D.   On: 03/12/2023 00:31   CT Head Wo Contrast  Result Date: 03/12/2023 CLINICAL DATA:  Recent trip and fall with headaches, initial encounter EXAM: CT HEAD WITHOUT  CONTRAST TECHNIQUE: Contiguous axial images were obtained from the base of the skull through the vertex without intravenous contrast. RADIATION DOSE REDUCTION: This exam was performed according to the departmental dose-optimization program which includes automated exposure control, adjustment of the mA and/or kV according to patient size and/or use of iterative reconstruction technique. COMPARISON:  None Available. FINDINGS: Brain: No evidence of acute infarction, hemorrhage, hydrocephalus, extra-axial collection or mass lesion/mass effect. Chronic atrophic and ischemic changes are noted. Vascular: No hyperdense vessel or unexpected calcification. Skull: Normal. Negative for fracture or focal lesion. Sinuses/Orbits: No acute finding. Other: None. IMPRESSION: Chronic atrophic and ischemic changes without acute abnormality. Electronically Signed   By: Inez Catalina M.D.   On: 03/12/2023 00:30    Assessment/Plan 1. Chronic heart failure with preserved ejection fraction (HFpEF) Patient appears euvolemic, however, continues to wear 2LNC. Continue to wean as tolerated. Based on goals of care, patient does not desire rehospitalization nor does she want to have CPR. Patient's Code status updated to DNR and [facility] order written at facility. Continue daily lasix 20 mg   2. Benzodiazepine dependence Patient has chronically taken a type of benzodiazepine for ~30 years per patient report. Declines decreasing dose despite concern for contributing to memory changes, falls, and death. Refill for xanax sent.   3. Closed left hip fracture, initial encounter Pain is well-controlled with tylenol and PRN tramadol. Continue to monitor. Continue physical therapy with a goal to return home.   4. Stage 3a chronic kidney disease (CKD) Stable renal disease. Hyponatremia noted on recent labs, will plan for repeat BMP. Of note, patient's albumin is low concerning for poor nutrtional status, however, patient's weight has been stable  for the last 7 months. Continue to monitor.   5. Hypertension, unspecified type BP well-controlled on current regimen. No changes made.   6. Permanent atrial fibrillation Rate controlled. No signs of bleeding. Cr 1.01. Continue apixiban 5mg  BID. Continue metoprolol 100 mg BID.   7. Coronary artery disease involving native coronary artery of native heart without angina pectoris Patient denies any recent episodes of chest pain. Nitroglycerine ordered. If patient has an event, plan to transition to comfort care.   8. Auditory hallucinations Patient has had an increase in auditory hallucinations for the last year which contributed to her fall. Discussed possible treatment if it continues while she is here. Would start risperdol 0.5mg  nightly.     Family/ staff Communication: Daughter, HCPOA [name]) Nursing.   Labs/tests ordered: [facility] BMP

## 2023-03-26 ENCOUNTER — Other Ambulatory Visit: Payer: Self-pay | Admitting: Student

## 2023-03-26 DIAGNOSIS — F1320 Sedative, hypnotic or anxiolytic dependence, uncomplicated: Secondary | ICD-10-CM

## 2023-03-26 MED ORDER — ALPRAZOLAM 1 MG PO TABS: 1.0000 mg | ORAL_TABLET | Freq: Every day | ORAL | 0 refills | Status: DC

## 2023-03-26 NOTE — Progress Notes (Signed)
Plan to schedule medication as patient has forotten to ask for the medication.

## 2023-03-27 ENCOUNTER — Non-Acute Institutional Stay (SKILLED_NURSING_FACILITY): Payer: Medicare Other | Admitting: Nurse Practitioner

## 2023-03-27 ENCOUNTER — Encounter: Payer: Self-pay | Admitting: Nurse Practitioner

## 2023-03-27 DIAGNOSIS — I5032 Chronic diastolic (congestive) heart failure: Secondary | ICD-10-CM

## 2023-03-27 DIAGNOSIS — R600 Localized edema: Secondary | ICD-10-CM

## 2023-03-27 DIAGNOSIS — E440 Moderate protein-calorie malnutrition: Secondary | ICD-10-CM | POA: Diagnosis not present

## 2023-03-27 DIAGNOSIS — Z8781 Personal history of (healed) traumatic fracture: Secondary | ICD-10-CM

## 2023-03-27 NOTE — Progress Notes (Signed)
Location:  Other [facility]) Nursing Home Room Number: 109 A Place of Service:  SNF (31)  Lottie Sigman K. Janyth Contes, NP   Patient Care Team: Creola Corn, MD as PCP - General (Internal Medicine) [name], MD as PCP - Cardiology (Cardiology) [name], MD as PCP - Electrophysiology (Cardiology)  Extended Emergency Contact Information Primary Emergency Contact: Sofie Rower [place] Home Phone: [phone] Relation: Daughter Secondary Emergency Contact: Aguinaldo,Danny Mobile Phone: [phone] Relation: Son  Goals of care: Advanced Directive information    03/27/2023    9:39 AM  Advanced Directives  Does Patient Have a Medical Advance Directive? Yes  Type of Advance Directive Out of facility DNR (pink MOST or yellow form)   Does patient want to make changes to medical advance directive? No - Patient declined     Significant value     Chief Complaint  Patient presents with   Acute Visit    Acute concerns     HPI:  Pt is a 89 y.o. female seen today for an acute visit for lower leg swelling.  Nursing concerned because yesterday bilateral feet were very swollen.  Pt has hx of CHF, left hip fracture, htn, a fib.  She denies any shortness of breath, chest pains or palpitations.  Reports swelling has improving significantly over night.  No leg redness or tenderness.    Past Medical History:  Diagnosis Date   Back pain    Bruises easily    Coronary artery disease    NONOBSTRUCTIVE   Fatigue    Hyperlipidemia    Hypertension    Knee pain    OA (osteoarthritis)    Obesity    Palpitations    SOB (shortness of breath)    Past Surgical History:  Procedure Laterality Date   BREAST BIOPSY     LEFT BREAST   CARDIAC CATHETERIZATION  05/11/2006   OVERALL CARDIAC SIZE AND SILHOUETTE ARE NORMAL. EF ESTIMATED 60%   INTRAMEDULLARY (IM) NAIL INTERTROCHANTERIC Left 03/12/2023   Procedure: INTRAMEDULLARY (IM) NAIL INTERTROCHANTERIC;  Surgeon:  Roby Lofts, MD;  Location: [facility] OR;  Service: Orthopedics;  Laterality: Left;   KIDNEY STONES  1982   KNEE ARTHROSCOPY  09/22/2008   VAGINAL HYSTERECTOMY      Allergies  Allergen Reactions   Codeine Other (See Comments)    Unknown reaction   Demerol [Meperidine Hcl] Other (See Comments)    Unknown reaction   Morphine And Related Other (See Comments)    Unknown reaction    Outpatient Encounter Medications as of 03/27/2023  Medication Sig   acetaminophen (TYLENOL) 325 MG tablet Take 650 mg by mouth every 6 (six) hours as needed.   ALPRAZolam (XANAX) 1 MG tablet Take 1 tablet (1 mg total) by mouth at bedtime.   apixaban (ELIQUIS) 5 MG TABS tablet Take 1 tablet (5 mg total) by mouth 2 (two) times daily.   bisacodyl (DULCOLAX) 10 MG suppository Place 10 mg rectally daily. As needed for constipation   ergocalciferol (VITAMIN D2) 50000 UNITS capsule Take 50,000 Units by mouth once a week.   furosemide (LASIX) 20 MG tablet TAKE 1 TABLET EVERY DAY   Infant Care Products (DERMACLOUD EX) Apply to buttocks topically every shift.   metoprolol tartrate (LOPRESSOR) 100 MG tablet Take 1 tablet (100 mg total) by mouth 2 (two) times daily. You may take an additional 1/2 tablet (50 mg total) by mouth daily as needed for palpitations.   nitroGLYCERIN (NITROSTAT) 0.4 MG SL tablet Place  1 tablet (0.4 mg total) under the tongue every 5 (five) minutes as needed for chest pain.   ondansetron (ZOFRAN) 4 MG tablet Take 4 mg by mouth every 8 (eight) hours as needed for nausea or vomiting.   polyethylene glycol (MIRALAX / GLYCOLAX) 17 g packet Take 17 g by mouth daily as needed for mild constipation.   traMADol (ULTRAM) 50 MG tablet Take 50 mg by mouth every 6 (six) hours as needed for moderate pain.   traMADol (ULTRAM) 50 MG tablet Take 100 mg by mouth every 6 (six) hours as needed for severe pain. See other listing   [DISCONTINUED] traMADol (ULTRAM) 50 MG tablet Take 1-2 tablets (50-100 mg total) by mouth every  6 (six) hours as needed for severe pain or moderate pain.   No facility-administered encounter medications on file as of 03/27/2023.    Review of Systems  Constitutional:  Negative for activity change, appetite change, fatigue and unexpected weight change.  HENT:  Negative for congestion and hearing loss.   Eyes: Negative.   Respiratory:  Negative for cough and shortness of breath.   Cardiovascular:  Negative for chest pain, palpitations and leg swelling.  Gastrointestinal:  Negative for abdominal pain.  Genitourinary:  Negative for difficulty urinating and dysuria.  Musculoskeletal:  Negative for arthralgias and myalgias.  Skin:  Negative for color change and wound.  Neurological:  Negative for dizziness and weakness.  Psychiatric/Behavioral:  Negative for agitation, behavioral problems and confusion.     Immunization History  Administered Date(s) Administered   Influenza, High Dose Seasonal PF 11/23/2022   PFIZER(Purple Top)SARS-COV-2 Vaccination 01/07/2020, 01/27/2020, 11/18/2020   Pertinent  Health Maintenance Due  Topic Date Due   DEXA SCAN  Never done   INFLUENZA VACCINE  07/19/2023       No data to display         Functional Status Survey:    Vitals:   03/27/23 0934 03/27/23 0941  BP: (!) 149/85 117/64  Pulse: 75   Resp: 18   Temp: (!) 97.3 F (36.3 C)   SpO2: 97%   Weight: 179 lb (81.2 kg)   Height: 5\' 6"  (1.676 m)    Body mass index is 28.89 kg/m. Physical Exam Constitutional:      General: She is not in acute distress.    Appearance: She is well-developed. She is not diaphoretic.  HENT:     Head: Normocephalic and atraumatic.     Mouth/Throat:     Pharynx: No oropharyngeal exudate.  Eyes:     Conjunctiva/sclera: Conjunctivae normal.     Pupils: Pupils are equal, round, and reactive to light.  Cardiovascular:     Rate and Rhythm: Normal rate and regular rhythm.     Heart sounds: Normal heart sounds.  Pulmonary:     Effort: Pulmonary effort is  normal.     Breath sounds: Normal breath sounds.  Abdominal:     General: Bowel sounds are normal.     Palpations: Abdomen is soft.  Musculoskeletal:     Cervical back: Normal range of motion and neck supple.     Right lower leg: No edema.     Left lower leg: No edema.     Comments: 1+ edema to feet/ankles bilaterally  Skin:    General: Skin is warm and dry.  Neurological:     Mental Status: She is alert.  Psychiatric:        Mood and Affect: Mood normal.     Labs reviewed: Recent  Labs    03/12/23 0425 03/13/23 0156 03/14/23 0217  NA 136 134* 133*  K 3.4* 3.9 4.2  CL 103 96* 95*  CO2 25 24 27   GLUCOSE 187* 161* 169*  BUN 14 20 27*  CREATININE 0.93 1.25* 1.03*  CALCIUM 8.8* 8.7* 8.5*   Recent Labs    03/13/23 0156 03/14/23 0217  AST 29 20  ALT 13 10  ALKPHOS 40 40  BILITOT 0.4 0.8  PROT 5.8* 5.7*  ALBUMIN 2.8* 2.7*   Recent Labs    03/11/23 2357 03/12/23 0425 03/13/23 0156 03/14/23 0217 03/15/23 0242 03/15/23 0926  WBC 14.8*   < > 14.5* 15.7* 13.9*  --   NEUTROABS 11.5*  --   --   --   --   --   HGB 13.3   < > 10.3* 9.1* 8.8* 8.8*  HCT 39.7   < > 31.7* 27.9* 26.0* 26.0*  MCV 93.2   < > 94.1 94.3 93.2  --   PLT 235   < > 232 218 250  --    < > = values in this interval not displayed.   Lab Results  Component Value Date   TSH 2.920 03/04/2018   Lab Results  Component Value Date   HGBA1C 6.9 (H) 03/12/2023   Lab Results  Component Value Date   CHOL 187 03/04/2018   HDL 44 03/04/2018   LDLCALC 90 03/04/2018   TRIG 267 (H) 03/04/2018   CHOLHDL 4.3 03/04/2018    Significant Diagnostic Results in last 30 days:  DG FEMUR PORT MIN 2 VIEWS LEFT  Result Date: 03/12/2023 CLINICAL DATA:  Post intramedullary nail left femur EXAM: LEFT FEMUR PORTABLE 2 VIEWS COMPARISON:  02/11/2023 FINDINGS: Status post intramedullary nail fixation of intratrochanteric fractures of the left femur, with mild persistent displacement fracture fragments. No evidence of  perihardware fracture or distal femoral fracture. Vascular calcinosis. IMPRESSION: Status post intramedullary nail fixation of intratrochanteric fractures of the left femur, with mild persistent displacement fracture fragments. No evidence of perihardware fracture or distal femoral fracture. Electronically Signed   By: Jearld Lesch M.D.   On: 03/12/2023 16:52   DG FEMUR MIN 2 VIEWS LEFT  Result Date: 03/12/2023 CLINICAL DATA:  Elective surgery EXAM: LEFT FEMUR 2 VIEWS COMPARISON:  None Available. FINDINGS: Seven intraprocedural fluoroscopic images show intramedullary nail fixation of proximal femur fracture with distal interlocking screw. No unexpected finding. Fracture alignment is anatomic. IMPRESSION: Fluoroscopy for femur fracture fixation.  No unexpected finding. Electronically Signed   By: Tiburcio Pea M.D.   On: 03/12/2023 15:06   DG C-Arm 1-60 Min-No Report  Result Date: 03/12/2023 Fluoroscopy was utilized by the requesting physician.  No radiographic interpretation.   DG FEMUR PORT MIN 2 VIEWS LEFT  Result Date: 03/12/2023 CLINICAL DATA:  Femoral fracture. EXAM: LEFT FEMUR PORTABLE 2 VIEWS COMPARISON:  None Available. FINDINGS: There is a comminuted impacted fracture of the left proximal femur, slightly inferior to the greater trochanter and involving the lesser trochanter. Butterfly fragment is seen medially. The left hip joint is normally located. IMPRESSION: Comminuted impacted fracture of the left proximal femur, slightly inferior to the greater trochanter and involving the lesser trochanter. Electronically Signed   By: Ted Mcalpine M.D.   On: 03/12/2023 08:23   DG Chest Portable 1 View  Result Date: 03/12/2023 CLINICAL DATA:  Recent fall with known left hip fracture EXAM: PORTABLE CHEST 1 VIEW COMPARISON:  06/02/2015 FINDINGS: Cardiac shadow is enlarged. Aortic calcifications are seen. Mild central  vascular congestion is noted without edema. No focal infiltrate is seen. No  bony abnormality is noted. IMPRESSION: Mild central vascular congestion. Electronically Signed   By: Alcide CleverMark  Lukens M.D.   On: 03/12/2023 00:32   DG Hip Unilat W or Wo Pelvis 2-3 Views Left  Result Date: 03/12/2023 CLINICAL DATA:  Recent trip and fall with left hip pain, initial encounter EXAM: DG HIP (WITH OR WITHOUT PELVIS) 2-V LEFT COMPARISON:  None Available. FINDINGS: Comminuted proximal left intratrochanteric femoral fracture is noted with impaction and angulation at the fracture site. Pelvic ring appears intact. Vascular calcifications are noted. IMPRESSION: Left intratrochanteric femoral fracture. Electronically Signed   By: Alcide CleverMark  Lukens M.D.   On: 03/12/2023 00:31   CT Head Wo Contrast  Result Date: 03/12/2023 CLINICAL DATA:  Recent trip and fall with headaches, initial encounter EXAM: CT HEAD WITHOUT CONTRAST TECHNIQUE: Contiguous axial images were obtained from the base of the skull through the vertex without intravenous contrast. RADIATION DOSE REDUCTION: This exam was performed according to the departmental dose-optimization program which includes automated exposure control, adjustment of the mA and/or kV according to patient size and/or use of iterative reconstruction technique. COMPARISON:  None Available. FINDINGS: Brain: No evidence of acute infarction, hemorrhage, hydrocephalus, extra-axial collection or mass lesion/mass effect. Chronic atrophic and ischemic changes are noted. Vascular: No hyperdense vessel or unexpected calcification. Skull: Normal. Negative for fracture or focal lesion. Sinuses/Orbits: No acute finding. Other: None. IMPRESSION: Chronic atrophic and ischemic changes without acute abnormality. Electronically Signed   By: Alcide CleverMark  Lukens M.D.   On: 03/12/2023 00:30    Assessment/Plan 1. Hx of fracture of left hip Continues to work with therapy, limited ROM, pain controled.   2. Chronic heart failure with preserved ejection fraction (HFpEF) -euvolemic. Continues on lasix 20  mg daily.   3. Bilateral edema of lower extremity -improved in the morning, encouraged to elevate legs above level of heart as tolerates, low sodium diet, compression hose as tolerates (on in am, off in pm) -proper nutrition/protein encouraged.   4. Moderate protein-calorie malnutrition -will follow up cmp, encourages 3 meals a day and supplements as needed. Weight has been stable.    Janene HarveyJessica K. Biagio BorgEubanks, AGNP [facility] [phone]

## 2023-03-29 ENCOUNTER — Other Ambulatory Visit: Payer: Self-pay | Admitting: Nurse Practitioner

## 2023-03-29 DIAGNOSIS — F1320 Sedative, hypnotic or anxiolytic dependence, uncomplicated: Secondary | ICD-10-CM

## 2023-03-29 LAB — COMPREHENSIVE METABOLIC PANEL
Albumin: 2.7 — AB (ref 3.5–5.0)
Calcium: 8.3 — AB (ref 8.7–10.7)
Globulin: 2.5
eGFR: 80

## 2023-03-29 LAB — BASIC METABOLIC PANEL
BUN: 17 (ref 4–21)
CO2: 29 — AB (ref 13–22)
Chloride: 100 (ref 99–108)
Creatinine: 0.7 (ref 0.5–1.1)
Glucose: 99
Potassium: 4.1 mEq/L (ref 3.5–5.1)
Sodium: 135 — AB (ref 137–147)

## 2023-03-29 LAB — HEPATIC FUNCTION PANEL
ALT: 6 U/L — AB (ref 7–35)
AST: 15 (ref 13–35)
Alkaline Phosphatase: 155 — AB (ref 25–125)
Bilirubin, Total: 1

## 2023-03-29 LAB — CBC: RBC: 3.46 — AB (ref 3.87–5.11)

## 2023-03-29 LAB — CBC AND DIFFERENTIAL
HCT: 33 — AB (ref 36–46)
Hemoglobin: 10.6 — AB (ref 12.0–16.0)
Neutrophils Absolute: 4672
Platelets: 406 10*3/uL — AB (ref 150–400)
WBC: 7.1

## 2023-03-29 MED ORDER — ALPRAZOLAM 1 MG PO TABS: 1.0000 mg | ORAL_TABLET | Freq: Every day | ORAL | 0 refills | Status: DC

## 2023-04-01 ENCOUNTER — Telehealth: Payer: Self-pay | Admitting: Orthopedic Surgery

## 2023-04-01 NOTE — Telephone Encounter (Signed)
[facility] nursing calls to report urinary retention. She was voiding on her own up until today. H/o postop retention within the past month. Foley was placed for a brief time and then discontinued. Recent bladder scan unreliable. She was I/O cath and nursing reports 600 cc after voiding 300 cc on her own. UA/culture was also sent per nursing. foley was also kept in place at this time. Advised nursing to discontinue foley and encourage oral fluids. Repeat bladder scan in 6 hours if she has not voided, if bladder scan > 400 cc may place foley. SBAR to provider also recommended.

## 2023-04-02 DIAGNOSIS — S72142D Displaced intertrochanteric fracture of left femur, subsequent encounter for closed fracture with routine healing: Secondary | ICD-10-CM | POA: Diagnosis not present

## 2023-04-16 ENCOUNTER — Other Ambulatory Visit: Payer: Self-pay | Admitting: Student

## 2023-04-16 ENCOUNTER — Non-Acute Institutional Stay (SKILLED_NURSING_FACILITY): Payer: Medicare Other | Admitting: Student

## 2023-04-16 ENCOUNTER — Encounter: Payer: Self-pay | Admitting: Student

## 2023-04-16 DIAGNOSIS — T8459XA Infection and inflammatory reaction due to other internal joint prosthesis, initial encounter: Secondary | ICD-10-CM

## 2023-04-16 DIAGNOSIS — T847XXA Infection and inflammatory reaction due to other internal orthopedic prosthetic devices, implants and grafts, initial encounter: Secondary | ICD-10-CM

## 2023-04-16 NOTE — Progress Notes (Signed)
Location:  Other [facility].  Nursing Home Room Number: [facility] 109A Place of Service:  SNF [phone]) Provider:  Dr. [name]  PCP: Creola Corn, MD  Patient Care Team: Creola Corn, MD as PCP - General (Internal Medicine) [name], MD as PCP - Cardiology (Cardiology) [name], MD as PCP - Electrophysiology (Cardiology)  Extended Emergency Contact Information Primary Emergency Contact: Sofie Rower [place] Home Phone: [phone] Relation: Daughter Secondary Emergency Contact: Tersigni,Danny Mobile Phone: [phone] Relation: Son  Code Status:  DNR Goals of care: Advanced Directive information    04/16/2023   11:25 AM  Advanced Directives  Does Patient Have a Medical Advance Directive? Yes  Type of Advance Directive Out of facility DNR (pink MOST or yellow form)  Does patient want to make changes to medical advance directive? No - Patient declined     Chief Complaint  Patient presents with   Acute Visit    Wound Check.     HPI:  Pt is a 89 y.o. female seen today for an acute visit for increased drainage, bleeding, tenderness and redness at the sight of the wound. Patient's vital signs stable. Denies nausea/vomiting, chills.   She is asking, "Well what do you think Doctor?" Multiple times. She has asked 5x during the encounter.   Her son and DIL are at bedside and state they are interested in making sure she gets taken care of, but would not pursue additional surgeries. They are leaning towards keeping her here for long-term care since she has stairs in the home and has not progressed to that point for return home.    Past Medical History:  Diagnosis Date   Back pain    Bruises easily    Coronary artery disease    NONOBSTRUCTIVE   Fatigue    Hyperlipidemia    Hypertension    Knee pain    OA (osteoarthritis)    Obesity    Palpitations    SOB (shortness of breath)    Past Surgical History:  Procedure Laterality  Date   BREAST BIOPSY     LEFT BREAST   CARDIAC CATHETERIZATION  05/11/2006   OVERALL CARDIAC SIZE AND SILHOUETTE ARE NORMAL. EF ESTIMATED 60%   INTRAMEDULLARY (IM) NAIL INTERTROCHANTERIC Left 03/12/2023   Procedure: INTRAMEDULLARY (IM) NAIL INTERTROCHANTERIC;  Surgeon: Roby Lofts, MD;  Location: [facility] OR;  Service: Orthopedics;  Laterality: Left;   KIDNEY STONES  1982   KNEE ARTHROSCOPY  09/22/2008   VAGINAL HYSTERECTOMY      Allergies  Allergen Reactions   Codeine Other (See Comments)    Unknown reaction   Demerol [Meperidine Hcl] Other (See Comments)    Unknown reaction   Morphine And Related Other (See Comments)    Unknown reaction    Outpatient Encounter Medications as of 04/16/2023  Medication Sig   acetaminophen (TYLENOL) 325 MG tablet Take 650 mg by mouth every 6 (six) hours as needed.   ALPRAZolam (XANAX) 1 MG tablet Take 1 tablet (1 mg total) by mouth at bedtime.   alum & mag hydroxide-simeth (MAALOX PLUS) 400-400-40 MG/5ML suspension Take 5 mLs by mouth every 4 (four) hours as needed for indigestion.   apixaban (ELIQUIS) 5 MG TABS tablet Take 1 tablet (5 mg total) by mouth 2 (two) times daily.   bisacodyl (DULCOLAX) 10 MG suppository Place 10 mg rectally daily. As needed for constipation   ergocalciferol (VITAMIN D2) 50000 UNITS capsule Take 50,000 Units by mouth once a week.  furosemide (LASIX) 20 MG tablet TAKE 1 TABLET EVERY DAY   Infant Care Products (DERMACLOUD EX) Apply to buttocks topically every shift.   metoprolol tartrate (LOPRESSOR) 100 MG tablet Take 1 tablet (100 mg total) by mouth 2 (two) times daily. You may take an additional 1/2 tablet (50 mg total) by mouth daily as needed for palpitations.   nitroGLYCERIN (NITROSTAT) 0.4 MG SL tablet Place 1 tablet (0.4 mg total) under the tongue every 5 (five) minutes as needed for chest pain.   ondansetron (ZOFRAN) 4 MG tablet Take 4 mg by mouth every 8 (eight) hours as needed for nausea or vomiting.   polyethylene  glycol (MIRALAX / GLYCOLAX) 17 g packet Take 17 g by mouth daily as needed for mild constipation.   traMADol (ULTRAM) 50 MG tablet Take 50 mg by mouth every 6 (six) hours as needed for moderate pain.   traMADol (ULTRAM) 50 MG tablet Take 100 mg by mouth every 6 (six) hours as needed for severe pain. See other listing   No facility-administered encounter medications on file as of 04/16/2023.    Review of Systems  Immunization History  Administered Date(s) Administered   Influenza, High Dose Seasonal PF 11/23/2022   PFIZER(Purple Top)SARS-COV-2 Vaccination 01/07/2020, 01/27/2020, 11/18/2020   Pertinent  Health Maintenance Due  Topic Date Due   DEXA SCAN  Never done   INFLUENZA VACCINE  07/19/2023       No data to display         Functional Status Survey:    Vitals:   04/16/23 1120  BP: 120/64  Pulse: 76  Resp: 18  Temp: 97.6 F (36.4 C)  SpO2: 96%  Weight: 172 lb 6.4 oz (78.2 kg)  Height: 5\' 6"  (1.676 m)   Body mass index is 27.83 kg/m. Physical Exam Constitutional:      Appearance: Normal appearance.  Musculoskeletal:     Comments: Left leg wound with increased redness, purulent drainage, and increased tenderness to palpation  Neurological:     Mental Status: She is alert. Mental status is at baseline. She is disoriented.     Labs reviewed: Recent Labs    03/12/23 0425 03/13/23 0156 03/14/23 0217  NA 136 134* 133*  K 3.4* 3.9 4.2  CL 103 96* 95*  CO2 25 24 27   GLUCOSE 187* 161* 169*  BUN 14 20 27*  CREATININE 0.93 1.25* 1.03*  CALCIUM 8.8* 8.7* 8.5*   Recent Labs    03/13/23 0156 03/14/23 0217  AST 29 20  ALT 13 10  ALKPHOS 40 40  BILITOT 0.4 0.8  PROT 5.8* 5.7*  ALBUMIN 2.8* 2.7*   Recent Labs    03/11/23 2357 03/12/23 0425 03/13/23 0156 03/14/23 0217 03/15/23 0242 03/15/23 0926  WBC 14.8*   < > 14.5* 15.7* 13.9*  --   NEUTROABS 11.5*  --   --   --   --   --   HGB 13.3   < > 10.3* 9.1* 8.8* 8.8*  HCT 39.7   < > 31.7* 27.9* 26.0*  26.0*  MCV 93.2   < > 94.1 94.3 93.2  --   PLT 235   < > 232 218 250  --    < > = values in this interval not displayed.   Lab Results  Component Value Date   TSH 2.920 03/04/2018   Lab Results  Component Value Date   HGBA1C 6.9 (H) 03/12/2023   Lab Results  Component Value Date   CHOL 187 03/04/2018  HDL 44 03/04/2018   LDLCALC 90 03/04/2018   TRIG 267 (H) 03/04/2018   CHOLHDL 4.3 03/04/2018    Significant Diagnostic Results in last 30 days:  No results found.  Assessment/Plan Hardware complicating wound infection, initial encounter [facility]) Concern for wound infection due to underlying hardware. CBC, BMP, CT left hip ordered. Start doxycycline 100 mg BID. F/u with ortho on 5/13.    Family/ staff Communication: Son DIL, Nursing  Labs/tests ordered:  CBC, BMP   I spent 30 minutes for the care of this patient including coordination of care and <15 minutes discussing goals of care with the family.

## 2023-04-18 DIAGNOSIS — M25552 Pain in left hip: Secondary | ICD-10-CM | POA: Diagnosis not present

## 2023-04-18 LAB — BASIC METABOLIC PANEL
BUN: 14 (ref 4–21)
CO2: 30 — AB (ref 13–22)
Chloride: 98 — AB (ref 99–108)
Creatinine: 0.7 (ref 0.5–1.1)
Glucose: 109
Potassium: 3.5 mEq/L (ref 3.5–5.1)
Sodium: 133 — AB (ref 137–147)

## 2023-04-18 LAB — CBC AND DIFFERENTIAL
HCT: 38 (ref 36–46)
Hemoglobin: 12.2 (ref 12.0–16.0)
Neutrophils Absolute: 6201
Platelets: 342 10*3/uL (ref 150–400)
WBC: 9.2

## 2023-04-18 LAB — COMPREHENSIVE METABOLIC PANEL
Calcium: 8.9 (ref 8.7–10.7)
eGFR: 80

## 2023-04-18 LAB — CBC: RBC: 4.01 (ref 3.87–5.11)

## 2023-04-19 ENCOUNTER — Other Ambulatory Visit: Payer: Self-pay | Admitting: Nurse Practitioner

## 2023-04-19 MED ORDER — TRAMADOL HCL 50 MG PO TABS: 100.0000 mg | ORAL_TABLET | Freq: Four times a day (QID) | ORAL | 0 refills | Status: DC | PRN

## 2023-04-19 MED ORDER — TRAMADOL HCL 50 MG PO TABS: 50.0000 mg | ORAL_TABLET | Freq: Four times a day (QID) | ORAL | 0 refills | Status: DC | PRN

## 2023-04-24 ENCOUNTER — Ambulatory Visit
Admission: RE | Admit: 2023-04-24 | Discharge: 2023-04-24 | Disposition: A | Discharge status: Home / Self Care | Payer: Medicare Other | Source: Ambulatory Visit | Attending: Student | Admitting: Student

## 2023-04-24 ENCOUNTER — Ambulatory Visit: Payer: Medicare Other

## 2023-04-24 DIAGNOSIS — Z96649 Presence of unspecified artificial hip joint: Secondary | ICD-10-CM | POA: Insufficient documentation

## 2023-04-24 DIAGNOSIS — T8459XA Infection and inflammatory reaction due to other internal joint prosthesis, initial encounter: Secondary | ICD-10-CM | POA: Diagnosis not present

## 2023-04-24 DIAGNOSIS — S72142D Displaced intertrochanteric fracture of left femur, subsequent encounter for closed fracture with routine healing: Secondary | ICD-10-CM | POA: Diagnosis not present

## 2023-04-24 MED ORDER — IOHEXOL 300 MG/ML  SOLN
100.0000 mL | Freq: Once | INTRAMUSCULAR | Status: AC | PRN
  Administered 2023-04-24: 100 mL via INTRAVENOUS

## 2023-04-25 ENCOUNTER — Encounter: Payer: Self-pay | Admitting: Student

## 2023-04-25 ENCOUNTER — Non-Acute Institutional Stay (SKILLED_NURSING_FACILITY): Payer: Medicare Other | Admitting: Student

## 2023-04-25 DIAGNOSIS — S72002G Fracture of unspecified part of neck of left femur, subsequent encounter for closed fracture with delayed healing: Secondary | ICD-10-CM | POA: Diagnosis not present

## 2023-04-25 DIAGNOSIS — M79605 Pain in left leg: Secondary | ICD-10-CM | POA: Diagnosis not present

## 2023-04-25 NOTE — Progress Notes (Signed)
Location:  Other [place].  Nursing Home Room Number: [facility] [address] Place of Service:  SNF [phone]) Provider:  Dr. Benetta Spar   PCP: Creola Corn, MD  Patient Care Team: Creola Corn, MD as PCP - General (Internal Medicine) [name], MD as PCP - Cardiology (Cardiology) [name], MD as PCP - Electrophysiology (Cardiology)  Extended Emergency Contact Information Primary Emergency Contact: Sofie Rower [place] Home Phone: [phone] Relation: Daughter Secondary Emergency Contact: Oppenheimer,Danny Mobile Phone: [phone] Relation: Son  Code Status:  DNR Goals of care: Advanced Directive information    04/25/2023   12:46 PM  Advanced Directives  Does Patient Have a Medical Advance Directive? Yes  Type of Advance Directive Out of facility DNR (pink MOST or yellow form)  Does patient want to make changes to medical advance directive? No - Patient declined     Chief Complaint  Patient presents with   Acute Visit    Left Leg Swelling.     HPI:  Pt is a 89 y.o. female seen today for an acute visit for Left Leg Swelling  Patient has had increased swelling and blistering of fractured leg for the last few days.   Children at bedside asking for results of CT scan from yesterday which shows no abscess, no bone infection. DVT screening ordered and was also negative.   Patient is doing okay with physical therapy, however, has significant weeping from the leg.   Past Medical History:  Diagnosis Date   Back pain    Bruises easily    Coronary artery disease    NONOBSTRUCTIVE   Fatigue    Hyperlipidemia    Hypertension    Knee pain    OA (osteoarthritis)    Obesity    Palpitations    SOB (shortness of breath)    Past Surgical History:  Procedure Laterality Date   BREAST BIOPSY     LEFT BREAST   CARDIAC CATHETERIZATION  05/11/2006   OVERALL CARDIAC SIZE AND SILHOUETTE ARE NORMAL. EF ESTIMATED 60%   INTRAMEDULLARY (IM) NAIL  INTERTROCHANTERIC Left 03/12/2023   Procedure: INTRAMEDULLARY (IM) NAIL INTERTROCHANTERIC;  Surgeon: Roby Lofts, MD;  Location: [facility] OR;  Service: Orthopedics;  Laterality: Left;   KIDNEY STONES  1982   KNEE ARTHROSCOPY  09/22/2008   VAGINAL HYSTERECTOMY      Allergies  Allergen Reactions   Codeine Other (See Comments)    Unknown reaction   Demerol [Meperidine Hcl] Other (See Comments)    Unknown reaction   Morphine And Related Other (See Comments)    Unknown reaction    Outpatient Encounter Medications as of 04/25/2023  Medication Sig   acetaminophen (TYLENOL) 325 MG tablet Take 650 mg by mouth every 6 (six) hours as needed.   ALPRAZolam (XANAX) 1 MG tablet Take 1 tablet (1 mg total) by mouth at bedtime.   alum & mag hydroxide-simeth (MAALOX PLUS) 400-400-40 MG/5ML suspension Take 5 mLs by mouth every 4 (four) hours as needed for indigestion.   apixaban (ELIQUIS) 5 MG TABS tablet Take 1 tablet (5 mg total) by mouth 2 (two) times daily.   bisacodyl (DULCOLAX) 10 MG suppository Place 10 mg rectally daily. As needed for constipation   doxycycline (ADOXA) 100 MG tablet Take 100 mg by mouth 2 (two) times daily.   ergocalciferol (VITAMIN D2) 50000 UNITS capsule Take 50,000 Units by mouth once a week.   furosemide (LASIX) 20 MG tablet TAKE 1 TABLET EVERY DAY   Infant Care Products (  DERMACLOUD EX) Apply to buttocks topically every shift.   metoprolol tartrate (LOPRESSOR) 100 MG tablet Take 100 mg by mouth 2 (two) times daily. Give 1/2 tablet (50mg ) by mouth every 24 hours as needed for palpitations.   nitroGLYCERIN (NITROSTAT) 0.4 MG SL tablet Place 1 tablet (0.4 mg total) under the tongue every 5 (five) minutes as needed for chest pain.   ondansetron (ZOFRAN) 4 MG tablet Take 4 mg by mouth every 8 (eight) hours as needed for nausea or vomiting.   polyethylene glycol (MIRALAX / GLYCOLAX) 17 g packet Take 17 g by mouth daily as needed for mild constipation.   traMADol (ULTRAM) 50 MG tablet  Take 1 tablet (50 mg total) by mouth every 6 (six) hours as needed for moderate pain.   traMADol (ULTRAM) 50 MG tablet Take 2 tablets (100 mg total) by mouth every 6 (six) hours as needed for severe pain. See other listing   [DISCONTINUED] metoprolol tartrate (LOPRESSOR) 100 MG tablet Take 1 tablet (100 mg total) by mouth 2 (two) times daily. You may take an additional 1/2 tablet (50 mg total) by mouth daily as needed for palpitations. (Patient taking differently: Take 100 mg by mouth 2 (two) times daily. You may take an additional Half tablet (50mg ) by mouth daily as needed for palpitations.)   No facility-administered encounter medications on file as of 04/25/2023.    Review of Systems  Immunization History  Administered Date(s) Administered   Influenza, High Dose Seasonal PF 11/23/2022   PFIZER(Purple Top)SARS-COV-2 Vaccination 01/07/2020, 01/27/2020, 11/18/2020   Pertinent  Health Maintenance Due  Topic Date Due   DEXA SCAN  Never done   INFLUENZA VACCINE  07/19/2023       No data to display         Functional Status Survey:    Vitals:   04/25/23 1238 04/25/23 1252  BP: (!) 142/88 130/75  Pulse: 77   Resp: 18   Temp: (!) 97.4 F (36.3 C)   SpO2: 93%   Weight: 172 lb 6.4 oz (78.2 kg)   Height: 5\' 6"  (1.676 m)    Body mass index is 27.83 kg/m. Physical Exam Constitutional:      Appearance: Normal appearance.  Cardiovascular:     Rate and Rhythm: Normal rate.     Pulses: Normal pulses.     Heart sounds: Normal heart sounds.  Pulmonary:     Effort: Pulmonary effort is normal.     Breath sounds: Normal breath sounds.  Musculoskeletal:     Comments: LLE with compression to obove the knee. Left upper leg with significant drainage. 2CM blister of the left lateral thigh. Pitting edema between the leg as well.   Neurological:     Mental Status: She is alert. Mental status is at baseline.     Labs reviewed: Recent Labs    03/12/23 0425 03/13/23 0156 03/14/23 0217  03/29/23 0000 04/18/23 0000  NA 136 134* 133* 135* 133*  K 3.4* 3.9 4.2 4.1 3.5  CL 103 96* 95* 100 98*  CO2 25 24 27  29* 30*  GLUCOSE 187* 161* 169*  --   --   BUN 14 20 27* 17 14  CREATININE 0.93 1.25* 1.03* 0.7 0.7  CALCIUM 8.8* 8.7* 8.5* 8.3* 8.9   Recent Labs    03/13/23 0156 03/14/23 0217 03/29/23 0000  AST 29 20 15   ALT 13 10 6*  ALKPHOS 40 40 155*  BILITOT 0.4 0.8  --   PROT 5.8* 5.7*  --  ALBUMIN 2.8* 2.7* 2.7*   Recent Labs    03/11/23 2357 03/12/23 0425 03/13/23 0156 03/14/23 0217 03/15/23 0242 03/15/23 0926 03/29/23 0000 04/18/23 0000  WBC 14.8*   < > 14.5* 15.7* 13.9*  --  7.1 9.2  NEUTROABS 11.5*  --   --   --   --   --  4,672.00 6,201.00  HGB 13.3   < > 10.3* 9.1* 8.8* 8.8* 10.6* 12.2  HCT 39.7   < > 31.7* 27.9* 26.0* 26.0* 33* 38  MCV 93.2   < > 94.1 94.3 93.2  --   --   --   PLT 235   < > 232 218 250  --  406* 342   < > = values in this interval not displayed.   Lab Results  Component Value Date   TSH 2.920 03/04/2018   Lab Results  Component Value Date   HGBA1C 6.9 (H) 03/12/2023   Lab Results  Component Value Date   CHOL 187 03/04/2018   HDL 44 03/04/2018   LDLCALC 90 03/04/2018   TRIG 267 (H) 03/04/2018   CHOLHDL 4.3 03/04/2018    Significant Diagnostic Results in last 30 days:  CT FEMUR LEFT W CONTRAST  Result Date: 04/25/2023 CLINICAL DATA:  Bleeding tenderness and redness at around in the left upper leg. Patient status post fixation of a left intertrochanteric fracture 03/07/2023. EXAM: CT OF THE LOWER LEFT EXTREMITY WITH CONTRAST TECHNIQUE: Multidetector CT imaging of the lower left extremity was performed according to the standard protocol following intravenous contrast administration. RADIATION DOSE REDUCTION: This exam was performed according to the departmental dose-optimization program which includes automated exposure control, adjustment of the mA and/or kV according to patient size and/or use of iterative reconstruction  technique. CONTRAST:  100 mL OMNIPAQUE IOHEXOL 300 MG/ML  SOLN COMPARISON:  Plain films left femur 03/12/2023. FINDINGS: Bones/Joint/Cartilage Intramedullary nail are over 2 hip screws and a single distal screw is in place for fixation of an intertrochanteric fracture. Hardware is intact without evidence of loosening. There is mild posterior displacement of the distal fracture fragment. The lesser trochanter is a separate fragment no bony destructive change or periosteal reaction is identified. No new fracture. Ligaments Suboptimally assessed by CT. Muscles and Tendons No intramuscular fluid collection or mass is seen. No gas within muscle or tracking along fascial planes is present. Soft tissues No focal fluid collection is identified. There is stranding in subcutaneous fat of the left thigh. No unexpected foreign body is seen. IMPRESSION: Negative for abscess, evidence of myositis or osteomyelitis. Stranding in subcutaneous fat could be postoperative, secondary to dependent change or cellulitis. Electronically Signed   By: Drusilla Kanner M.D.   On: 04/25/2023 11:05    Assessment/Plan Closed fracture of left hip with delayed healing, subsequent encounter DOI 03/11/23 s/p nail placement. Patient has had slow wound healing and slow progression with therapy due to initial delirium and now due to worsening swelling. Patient to complete antibiotics today. DVT ultrasound ordered and results negative for DVT. Will plan for Lasix 40 mg daily for 10 days. BMP in 1week. CT scan negative for signs of infection at this time-- requested stat read from yesterday.   Family/ staff Communication: nursing, children  Lab/s/tests ordered:  BMP 1 week.    I spent greater than 30 minutes for the care of this patient in face to face time, chart review, clinical documentation, patient education, imaging review, goals of care, communication with specialist.

## 2023-04-30 LAB — COMPREHENSIVE METABOLIC PANEL
Calcium: 8.4 — AB (ref 8.7–10.7)
eGFR: 69

## 2023-04-30 LAB — BASIC METABOLIC PANEL
BUN: 23 — AB (ref 4–21)
CO2: 34 — AB (ref 13–22)
Chloride: 97 — AB (ref 99–108)
Creatinine: 0.8 (ref 0.5–1.1)
Glucose: 94
Potassium: 3.6 mEq/L (ref 3.5–5.1)
Sodium: 138 (ref 137–147)

## 2023-04-30 LAB — CBC: RBC: 4.31 (ref 3.87–5.11)

## 2023-04-30 LAB — CBC AND DIFFERENTIAL
HCT: 40 (ref 36–46)
Hemoglobin: 12.8 (ref 12.0–16.0)
Neutrophils Absolute: 4930
Platelets: 413 10*3/uL — AB (ref 150–400)
WBC: 8.3

## 2023-05-01 ENCOUNTER — Encounter: Payer: Self-pay | Admitting: Nurse Practitioner

## 2023-05-01 ENCOUNTER — Non-Acute Institutional Stay (SKILLED_NURSING_FACILITY): Payer: Medicare Other | Admitting: Nurse Practitioner

## 2023-05-01 DIAGNOSIS — I4821 Permanent atrial fibrillation: Secondary | ICD-10-CM | POA: Diagnosis not present

## 2023-05-01 DIAGNOSIS — R32 Unspecified urinary incontinence: Secondary | ICD-10-CM | POA: Diagnosis not present

## 2023-05-01 DIAGNOSIS — I5032 Chronic diastolic (congestive) heart failure: Secondary | ICD-10-CM

## 2023-05-01 DIAGNOSIS — R600 Localized edema: Secondary | ICD-10-CM | POA: Diagnosis not present

## 2023-05-01 DIAGNOSIS — K5904 Chronic idiopathic constipation: Secondary | ICD-10-CM

## 2023-05-01 DIAGNOSIS — I10 Essential (primary) hypertension: Secondary | ICD-10-CM | POA: Diagnosis not present

## 2023-05-01 DIAGNOSIS — S72002G Fracture of unspecified part of neck of left femur, subsequent encounter for closed fracture with delayed healing: Secondary | ICD-10-CM | POA: Diagnosis not present

## 2023-05-01 DIAGNOSIS — E440 Moderate protein-calorie malnutrition: Secondary | ICD-10-CM | POA: Diagnosis not present

## 2023-05-01 NOTE — Progress Notes (Signed)
Location:  Other [place].  Nursing Home Room Number: [facility] [address] Place of Service:  SNF [phone]) [name], NP  PCP: Creola Corn, MD  Patient Care Team: Creola Corn, MD as PCP - General (Internal Medicine) [name], MD as PCP - Cardiology (Cardiology) [name], MD as PCP - Electrophysiology (Cardiology)  Extended Emergency Contact Information Primary Emergency Contact: Sofie Rower [place] Home Phone: [phone] Relation: Daughter Secondary Emergency Contact: Tarleton,Danny Mobile Phone: [phone] Relation: Son  Goals of care: Advanced Directive information    05/01/2023   12:19 PM  Advanced Directives  Does Patient Have a Medical Advance Directive? Yes  Type of Advance Directive Out of facility DNR (pink MOST or yellow form)  Does patient want to make changes to medical advance directive? No - Patient declined     Chief Complaint  Patient presents with   Medical Management of Chronic Issues    Medical Management of Chronic Issues     HPI:  Pt is a 89 y.o. female seen today for medical management of chronic disease. Pt with hx of hyperlipidemia, htn, obesity, hx of left hip fracture, anxiety.  She has appt with urologist tomorrow because she has to urinate frequently. She does not have pain.   She continues with PT but not getting around like she used to.  Her legs were so swollen she is not able to walk like she should.  Her swelling is better than it was. Continues on lasix 40 mg daily  She does not have an appetite. She will look at her food and send it back.   Reports she is constipated, has been giving her as stool softener which hasn't helped.   A fib- no palpitations.  No chest pains, shortness of breath.   No pain or soreness.    Past Medical History:  Diagnosis Date   Back pain    Bruises easily    Coronary artery disease    NONOBSTRUCTIVE   Fatigue    Hyperlipidemia    Hypertension    Knee  pain    OA (osteoarthritis)    Obesity    Palpitations    SOB (shortness of breath)    Past Surgical History:  Procedure Laterality Date   BREAST BIOPSY     LEFT BREAST   CARDIAC CATHETERIZATION  05/11/2006   OVERALL CARDIAC SIZE AND SILHOUETTE ARE NORMAL. EF ESTIMATED 60%   INTRAMEDULLARY (IM) NAIL INTERTROCHANTERIC Left 03/12/2023   Procedure: INTRAMEDULLARY (IM) NAIL INTERTROCHANTERIC;  Surgeon: Roby Lofts, MD;  Location: [facility] OR;  Service: Orthopedics;  Laterality: Left;   KIDNEY STONES  1982   KNEE ARTHROSCOPY  09/22/2008   VAGINAL HYSTERECTOMY      Allergies  Allergen Reactions   Codeine Other (See Comments)    Unknown reaction   Demerol [Meperidine Hcl] Other (See Comments)    Unknown reaction   Morphine And Related Other (See Comments)    Unknown reaction    Outpatient Encounter Medications as of 05/01/2023  Medication Sig   acetaminophen (TYLENOL) 325 MG tablet Take 650 mg by mouth every 6 (six) hours as needed.   ALPRAZolam (XANAX) 1 MG tablet Take 1 tablet (1 mg total) by mouth at bedtime.   alum & mag hydroxide-simeth (MAALOX PLUS) 400-400-40 MG/5ML suspension Take 5 mLs by mouth every 4 (four) hours as needed for indigestion.   apixaban (ELIQUIS) 5 MG TABS tablet Take 1 tablet (5 mg total) by mouth 2 (two) times daily.  bisacodyl (DULCOLAX) 10 MG suppository Place 10 mg rectally daily. As needed for constipation   doxycycline (ADOXA) 100 MG tablet Take 100 mg by mouth 2 (two) times daily.   ergocalciferol (VITAMIN D2) 50000 UNITS capsule Take 50,000 Units by mouth once a week.   furosemide (LASIX) 20 MG tablet TAKE 1 TABLET EVERY DAY   furosemide (LASIX) 40 MG tablet Take 40 mg by mouth daily.   Infant Care Products [facility] [employer] EX) Apply to buttocks topically every shift.   metoprolol tartrate (LOPRESSOR) 100 MG tablet Take 100 mg by mouth 2 (two) times daily. Give 1/2 tablet (50mg ) by mouth every 24 hours as needed for palpitations.   nitroGLYCERIN (NITROSTAT)  0.4 MG SL tablet Place 1 tablet (0.4 mg total) under the tongue every 5 (five) minutes as needed for chest pain.   ondansetron (ZOFRAN) 4 MG tablet Take 4 mg by mouth every 8 (eight) hours as needed for nausea or vomiting.   polyethylene glycol (MIRALAX / GLYCOLAX) 17 g packet Take 17 g by mouth daily as needed for mild constipation.   traMADol (ULTRAM) 50 MG tablet Take 50 mg by mouth every 6 (six) hours as needed. Give 2 tablets by mouth every 6 hours as needed for severe pain.   [DISCONTINUED] traMADol (ULTRAM) 50 MG tablet Take 1 tablet (50 mg total) by mouth every 6 (six) hours as needed for moderate pain. (Patient taking differently: Take 100 mg by mouth every 6 (six) hours as needed for moderate pain.)   [DISCONTINUED] traMADol (ULTRAM) 50 MG tablet Take 2 tablets (100 mg total) by mouth every 6 (six) hours as needed for severe pain. See other listing   No facility-administered encounter medications on file as of 05/01/2023.    Review of Systems  Constitutional:  Positive for appetite change. Negative for activity change and fatigue.  HENT:  Negative for congestion and hearing loss.   Eyes: Negative.   Respiratory:  Negative for cough and shortness of breath.   Cardiovascular:  Positive for leg swelling. Negative for chest pain and palpitations.  Gastrointestinal:  Positive for constipation. Negative for abdominal pain and diarrhea.  Genitourinary:  Negative for difficulty urinating and dysuria.  Musculoskeletal:  Negative for arthralgias and myalgias.  Skin:  Negative for color change and wound.  Neurological:  Negative for dizziness and weakness.  Psychiatric/Behavioral:  Negative for agitation, behavioral problems and confusion.      Immunization History  Administered Date(s) Administered   Influenza, High Dose Seasonal PF 11/23/2022   PFIZER(Purple Top)SARS-COV-2 Vaccination 01/07/2020, 01/27/2020, 11/18/2020   Pertinent  Health Maintenance Due  Topic Date Due   DEXA SCAN   Never done   INFLUENZA VACCINE  07/19/2023       No data to display         Functional Status Survey:    Vitals:   05/01/23 1209  BP: 122/69  Pulse: 73  Resp: 18  Temp: (!) 97 F (36.1 C)  SpO2: 92%  Weight: 172 lb 6.4 oz (78.2 kg)  Height: 5\' 6"  (1.676 m)   Body mass index is 27.83 kg/m. Physical Exam Constitutional:      General: She is not in acute distress.    Appearance: She is well-developed. She is not diaphoretic.  HENT:     Head: Normocephalic and atraumatic.     Mouth/Throat:     Pharynx: No oropharyngeal exudate.  Eyes:     Conjunctiva/sclera: Conjunctivae normal.     Pupils: Pupils are equal, round, and reactive  to light.  Cardiovascular:     Rate and Rhythm: Normal rate and regular rhythm.     Heart sounds: Normal heart sounds.  Pulmonary:     Effort: Pulmonary effort is normal.     Breath sounds: Normal breath sounds.  Abdominal:     General: Bowel sounds are normal.     Palpations: Abdomen is soft.  Musculoskeletal:     Cervical back: Normal range of motion and neck supple.     Right lower leg: No edema.     Left lower leg: No edema.  Skin:    General: Skin is warm and dry.  Neurological:     Mental Status: She is alert.  Psychiatric:        Mood and Affect: Mood normal.     Labs reviewed: Recent Labs    03/12/23 0425 03/13/23 0156 03/14/23 0217 03/29/23 0000 04/18/23 0000 04/30/23 0000  NA 136 134* 133* 135* 133* 138  K 3.4* 3.9 4.2 4.1 3.5 3.6  CL 103 96* 95* 100 98* 97*  CO2 25 24 27  29* 30* 34*  GLUCOSE 187* 161* 169*  --   --   --   BUN 14 20 27* 17 14 23*  CREATININE 0.93 1.25* 1.03* 0.7 0.7 0.8  CALCIUM 8.8* 8.7* 8.5* 8.3* 8.9 8.4*   Recent Labs    03/13/23 0156 03/14/23 0217 03/29/23 0000  AST 29 20 15   ALT 13 10 6*  ALKPHOS 40 40 155*  BILITOT 0.4 0.8  --   PROT 5.8* 5.7*  --   ALBUMIN 2.8* 2.7* 2.7*   Recent Labs    03/13/23 0156 03/14/23 0217 03/15/23 0242 03/15/23 0926 03/29/23 0000 04/18/23 0000  04/30/23 0000  WBC 14.5* 15.7* 13.9*  --  7.1 9.2 8.3  NEUTROABS  --   --   --   --  4,672.00 6,201.00 4,930.00  HGB 10.3* 9.1* 8.8*   < > 10.6* 12.2 12.8  HCT 31.7* 27.9* 26.0*   < > 33* 38 40  MCV 94.1 94.3 93.2  --   --   --   --   PLT 232 218 250  --  406* 342 413*   < > = values in this interval not displayed.   Lab Results  Component Value Date   TSH 2.920 03/04/2018   Lab Results  Component Value Date   HGBA1C 6.9 (H) 03/12/2023   Lab Results  Component Value Date   CHOL 187 03/04/2018   HDL 44 03/04/2018   LDLCALC 90 03/04/2018   TRIG 267 (H) 03/04/2018   CHOLHDL 4.3 03/04/2018    Significant Diagnostic Results in last 30 days:  CT FEMUR LEFT W CONTRAST  Result Date: 04/25/2023 CLINICAL DATA:  Bleeding tenderness and redness at around in the left upper leg. Patient status post fixation of a left intertrochanteric fracture 03/07/2023. EXAM: CT OF THE LOWER LEFT EXTREMITY WITH CONTRAST TECHNIQUE: Multidetector CT imaging of the lower left extremity was performed according to the standard protocol following intravenous contrast administration. RADIATION DOSE REDUCTION: This exam was performed according to the departmental dose-optimization program which includes automated exposure control, adjustment of the mA and/or kV according to patient size and/or use of iterative reconstruction technique. CONTRAST:  100 mL OMNIPAQUE IOHEXOL 300 MG/ML  SOLN COMPARISON:  Plain films left femur 03/12/2023. FINDINGS: Bones/Joint/Cartilage Intramedullary nail are over 2 hip screws and a single distal screw is in place for fixation of an intertrochanteric fracture. Hardware is intact without evidence of  loosening. There is mild posterior displacement of the distal fracture fragment. The lesser trochanter is a separate fragment no bony destructive change or periosteal reaction is identified. No new fracture. Ligaments Suboptimally assessed by CT. Muscles and Tendons No intramuscular fluid collection  or mass is seen. No gas within muscle or tracking along fascial planes is present. Soft tissues No focal fluid collection is identified. There is stranding in subcutaneous fat of the left thigh. No unexpected foreign body is seen. IMPRESSION: Negative for abscess, evidence of myositis or osteomyelitis. Stranding in subcutaneous fat could be postoperative, secondary to dependent change or cellulitis. Electronically Signed   By: Drusilla Kanner M.D.   On: 04/25/2023 11:05    Assessment/Plan 1. Moderate protein-calorie malnutrition (HCC) -encouraged 3 meals a day, protein supplements in addition to meals.   2. Bilateral edema of lower extremity -continues on lasix 40 mg by mouth daily, improvement in edema at this time. Continue compression hose.   3. Chronic heart failure with preserved ejection fraction (HFpEF) (HCC) -stable, continues on metoprolol twice daily with lasix  4. Permanent atrial fibrillation (HCC) -rate controlled on metoprolol  5. Hypertension, unspecified type -Blood pressure well controlled, goal bp <140/90 Continue current medications and dietary modifications follow metabolic panel  6. Closed fracture of left hip with delayed healing, subsequent encounter -stable, without pain at this time  7. Urinary incontinence, unspecified type Ongoing, she has increase in lasix due to LE edema which has increased frequency and incontinence. Encouraged scheduled tolieting.     8. Chronic idiopathic constipation Ongoing, Will add miralax 17 gm daily  Santana Edell K. Biagio [facility] [phone]

## 2023-05-02 ENCOUNTER — Ambulatory Visit (INDEPENDENT_AMBULATORY_CARE_PROVIDER_SITE_OTHER): Payer: Medicare Other | Admitting: Urology

## 2023-05-02 VITALS — BP 115/74 | HR 84 | Ht 66.0 in | Wt 170.0 lb

## 2023-05-02 DIAGNOSIS — N952 Postmenopausal atrophic vaginitis: Secondary | ICD-10-CM | POA: Diagnosis not present

## 2023-05-02 DIAGNOSIS — R300 Dysuria: Secondary | ICD-10-CM

## 2023-05-02 DIAGNOSIS — R339 Retention of urine, unspecified: Secondary | ICD-10-CM | POA: Diagnosis not present

## 2023-05-02 DIAGNOSIS — N39498 Other specified urinary incontinence: Secondary | ICD-10-CM | POA: Diagnosis not present

## 2023-05-02 DIAGNOSIS — N3001 Acute cystitis with hematuria: Secondary | ICD-10-CM

## 2023-05-02 LAB — BLADDER SCAN AMB NON-IMAGING: Scan Result: 74

## 2023-05-02 MED ORDER — PREMARIN 0.625 MG/GM VA CREA
TOPICAL_CREAM | VAGINAL | 12 refills | Status: DC
Start: 2023-05-02 — End: 2023-05-21

## 2023-05-02 MED ORDER — SULFAMETHOXAZOLE-TRIMETHOPRIM 800-160 MG PO TABS
1.0000 | ORAL_TABLET | Freq: Two times a day (BID) | ORAL | 0 refills | Status: DC
Start: 2023-05-02 — End: 2023-05-21

## 2023-05-02 NOTE — Progress Notes (Signed)
Marcelle Overlie Plume,acting as a scribe for Vanna Scotland, MD.,have documented all relevant documentation on the behalf of Vanna Scotland, MD,as directed by  Vanna Scotland, MD while in the presence of Vanna Scotland, MD.  05/02/2023 4:49 PM   [name] 10/10/1926 [id number]  Referring provider: [name], MD [address] [place],  [place] [address]  Chief Complaint  Patient presents with   New Patient (Initial Visit)   Urinary Retention    HPI: 89 year old female who presents for further evaluation of dysuria. Notes from her providers indicate that she's had some worsening incontinence, but her Lasix dose has been increased. Looks like she was recently hospitalized with a left hip fracture. She complained of some dysuria during that admission. The UA was fairly unremarkable, and her urine culture was negative. Gaynelle Adu was treated empirically on ceftriaxone and transitioned to Keflex. She also had acute urinary retention, and she was discharged home with a Foley catheter in place with plans to follow up with urology as an outpatient. She was discharged on 03/15/2023 but she is just following up now.   She has been at [facility] [place] where she was I/O cath on 04/01/2023. First 600 cc's after voiding 300 cc's on her own. A urine culture was sent but the results are not available.   A urinalysis today showed >30 WBC's per high powered field, no blood, moderate bacteria, nitrate negative as well as yeast appreciated. A urine culture is pending.  Today, despite the catheter's removal and an increase in Lasix dosage, she has reported worsening incontinence, urinary retention, and dysuria. She denies any fevers or chills. She has also been experiencing severe constipation, which she feels is exacerbating her urinary symptoms. She reports a history of recurrent UTIs. Currently, she is on doxycycline for suspected infected hardware.  Results for orders placed or performed in visit on 05/02/23  BLADDER  SCAN AMB NON-IMAGING  Result Value Ref Range   Scan Result 74 ml      PMH: Past Medical History:  Diagnosis Date   Back pain    Bruises easily    Coronary artery disease    NONOBSTRUCTIVE   Fatigue    Hyperlipidemia    Hypertension    Knee pain    OA (osteoarthritis)    Obesity    Palpitations    SOB (shortness of breath)     Surgical History: Past Surgical History:  Procedure Laterality Date   BREAST BIOPSY     LEFT BREAST   CARDIAC CATHETERIZATION  05/11/2006   OVERALL CARDIAC SIZE AND SILHOUETTE ARE NORMAL. EF ESTIMATED 60%   INTRAMEDULLARY (IM) NAIL INTERTROCHANTERIC Left 03/12/2023   Procedure: INTRAMEDULLARY (IM) NAIL INTERTROCHANTERIC;  Surgeon: Roby Lofts, MD;  Location: [facility] OR;  Service: Orthopedics;  Laterality: Left;   KIDNEY STONES  1982   KNEE ARTHROSCOPY  09/22/2008   VAGINAL HYSTERECTOMY      Home Medications:  Allergies as of 05/02/2023       Reactions   Codeine Other (See Comments)   Unknown reaction   Demerol [meperidine Hcl] Other (See Comments)   Unknown reaction   Morphine And Codeine Other (See Comments)   Unknown reaction        Medication List        Accurate as of May 02, 2023  4:49 PM. If you have any questions, ask your nurse or doctor.          acetaminophen 325 MG tablet Commonly known as: TYLENOL Take  650 mg by mouth every 6 (six) hours as needed.   ALPRAZolam 1 MG tablet Commonly known as: XANAX Take 1 tablet (1 mg total) by mouth at bedtime.   alum & mag hydroxide-simeth 400-400-40 MG/5ML suspension Commonly known as: MAALOX PLUS Take 5 mLs by mouth every 4 (four) hours as needed for indigestion.   apixaban 5 MG Tabs tablet Commonly known as: Eliquis Take 1 tablet (5 mg total) by mouth 2 (two) times daily.   bisacodyl 10 MG suppository Commonly known as: DULCOLAX Place 10 mg rectally daily. As needed for constipation   DERMACLOUD EX Apply to buttocks topically every shift.   doxycycline 100 MG  tablet Commonly known as: ADOXA Take 100 mg by mouth 2 (two) times daily.   ergocalciferol 1.25 MG (50000 UT) capsule Commonly known as: VITAMIN D2 Take 50,000 Units by mouth once a week.   furosemide 20 MG tablet Commonly known as: LASIX TAKE 1 TABLET EVERY DAY What changed:  how much to take Another medication with the same name was removed. Continue taking this medication, and follow the directions you see here.   metoprolol tartrate 100 MG tablet Commonly known as: LOPRESSOR Take 100 mg by mouth 2 (two) times daily. Give 1/2 tablet (50mg ) by mouth every 24 hours as needed for palpitations.   nitroGLYCERIN 0.4 MG SL tablet Commonly known as: NITROSTAT Place 1 tablet (0.4 mg total) under the tongue every 5 (five) minutes as needed for chest pain.   ondansetron 4 MG tablet Commonly known as: ZOFRAN Take 4 mg by mouth every 8 (eight) hours as needed for nausea or vomiting.   polyethylene glycol 17 g packet Commonly known as: MIRALAX / GLYCOLAX Take 17 g by mouth daily as needed for mild constipation.   Premarin vaginal cream Generic drug: conjugated estrogens Estrogen Cream Instruction Discard applicator Apply pea sized amount to tip of finger to urethra before bed. Wash hands well after application. Use Monday, Wednesday and Friday   sulfamethoxazole-trimethoprim 800-160 MG tablet Commonly known as: BACTRIM DS Take 1 tablet by mouth 2 (two) times daily.   traMADol 50 MG tablet Commonly known as: ULTRAM Take 50 mg by mouth every 6 (six) hours as needed. Give 2 tablets by mouth every 6 hours as needed for severe pain.        Allergies:  Allergies  Allergen Reactions   Codeine Other (See Comments)    Unknown reaction   Demerol [Meperidine Hcl] Other (See Comments)    Unknown reaction   Morphine And Codeine Other (See Comments)    Unknown reaction    Family History: Family History  Problem Relation Age of Onset   Heart disease Mother    Stroke Mother     Heart attack Father     Social History:  reports that she has never smoked. She has never used smokeless tobacco. She reports that she does not drink alcohol and does not use drugs.   Physical Exam: BP 115/74   Pulse 84   Ht 5\' 6"  (1.676 m)   Wt 170 lb (77.1 kg)   BMI 27.44 kg/m   Constitutional:  Alert and oriented, No acute distress.  HEENT: [place] AT, moist mucus membranes.  Trachea midline, no masses. GU: Chaperoned by Cristy Hilts showed evidence of vaginal stenosis, partially fused labia, extremely irritated and dry vaginal and periurethral mucosa. Neurologic: Grossly intact, no focal deficits, moving all 4 extremities. Psychiatric: Normal mood and affect.   Assessment & Plan:  1. History of urinary retention - Likely multifactorial in the setting of immobility, polypharmacy, pain, etc.  - She seems to be voiding spontaneously now without difficulty. Her PVR is relatively low. She is unable to urinate at this time.  2. Urinary incontinence - Likely exacerbated by her severe constipation - Very hesitant to start her on anticholinergic due to her severe constipation as well as cognitive issues. Also hesitant to start a beta-3 agonist because of her recent history of retention difficulty urinating.  3. Dysuria - Catheterized for urinalysis - She is currently on doxycycline for infected hardware - May be vaginal irritation  4. Atrophic vaginitis - Plan on topical estrogen cream to be used daily, small pea-sized amount on her urethra for two weeks, and then three times a week thereafter.  5. Acute cystitis - Urine culture was ordered - Bactrim DS BID x 5 days - Follow up as needed  Return if symptoms worsen or fail to improve.   [facility] [address], [address] [place], [place] [address] [phone]

## 2023-05-03 LAB — MICROSCOPIC EXAMINATION: WBC, UA: >30 /hpf — AB (ref 0–5)

## 2023-05-03 LAB — URINALYSIS, COMPLETE
Bilirubin, UA: NEGATIVE
Glucose, UA: NEGATIVE
Ketones, UA: NEGATIVE
Nitrite, UA: NEGATIVE
Protein,UA: NEGATIVE
Specific Gravity, UA: 1.01 (ref 1.005–1.030)
Urobilinogen, Ur: 0.2 mg/dL (ref 0.2–1.0)
pH, UA: 5.5 (ref 5.0–7.5)

## 2023-05-07 ENCOUNTER — Other Ambulatory Visit: Payer: Self-pay

## 2023-05-07 DIAGNOSIS — B379 Candidiasis, unspecified: Secondary | ICD-10-CM

## 2023-05-07 LAB — CULTURE, URINE COMPREHENSIVE

## 2023-05-07 MED ORDER — FLUCONAZOLE 200 MG PO TABS: 200.0000 mg | ORAL_TABLET | Freq: Every day | ORAL | 0 refills | Status: DC

## 2023-05-07 NOTE — Progress Notes (Signed)
Catheterized urine specimen ended up growing a yeast in her bladder.  Recommend fluconazole 200 mg daily for 2 weeks to try to clear this as it may be contributing to her urinary symptoms.   Vanna Scotland, MD  See result note   Left message for patient's daughter Nelva Bush to call back to be advised, called [facility] location [phone]- order needs to be faxed over to them at [phone] with [place] and gave verbal order also. RX sent to [facility] pharmacy also.

## 2023-05-08 DIAGNOSIS — S72142D Displaced intertrochanteric fracture of left femur, subsequent encounter for closed fracture with routine healing: Secondary | ICD-10-CM | POA: Diagnosis not present

## 2023-05-21 ENCOUNTER — Encounter: Payer: Self-pay | Admitting: Student

## 2023-05-21 ENCOUNTER — Non-Acute Institutional Stay (SKILLED_NURSING_FACILITY): Payer: Medicare Other | Admitting: Student

## 2023-05-21 DIAGNOSIS — S72142D Displaced intertrochanteric fracture of left femur, subsequent encounter for closed fracture with routine healing: Secondary | ICD-10-CM | POA: Diagnosis not present

## 2023-05-21 DIAGNOSIS — Z9181 History of falling: Secondary | ICD-10-CM | POA: Diagnosis not present

## 2023-05-21 DIAGNOSIS — S72002D Fracture of unspecified part of neck of left femur, subsequent encounter for closed fracture with routine healing: Secondary | ICD-10-CM | POA: Diagnosis not present

## 2023-05-21 DIAGNOSIS — I5032 Chronic diastolic (congestive) heart failure: Secondary | ICD-10-CM | POA: Diagnosis not present

## 2023-05-21 DIAGNOSIS — N1831 Chronic kidney disease, stage 3a: Secondary | ICD-10-CM

## 2023-05-21 DIAGNOSIS — E440 Moderate protein-calorie malnutrition: Secondary | ICD-10-CM

## 2023-05-21 DIAGNOSIS — Z741 Need for assistance with personal care: Secondary | ICD-10-CM | POA: Diagnosis not present

## 2023-05-21 DIAGNOSIS — F1320 Sedative, hypnotic or anxiolytic dependence, uncomplicated: Secondary | ICD-10-CM

## 2023-05-21 DIAGNOSIS — I4821 Permanent atrial fibrillation: Secondary | ICD-10-CM

## 2023-05-21 DIAGNOSIS — I10 Essential (primary) hypertension: Secondary | ICD-10-CM | POA: Diagnosis not present

## 2023-05-21 DIAGNOSIS — S72002G Fracture of unspecified part of neck of left femur, subsequent encounter for closed fracture with delayed healing: Secondary | ICD-10-CM

## 2023-05-21 DIAGNOSIS — I130 Hypertensive heart and chronic kidney disease with heart failure and stage 1 through stage 4 chronic kidney disease, or unspecified chronic kidney disease: Secondary | ICD-10-CM | POA: Diagnosis not present

## 2023-05-21 DIAGNOSIS — M6281 Muscle weakness (generalized): Secondary | ICD-10-CM | POA: Diagnosis not present

## 2023-05-21 NOTE — Progress Notes (Unsigned)
Location:  Other [facility].  Nursing Home Room Number: [facility] [address] Place of Service:  SNF [phone]) Provider:  Dr. [name]  PCP: Creola Corn, MD  Patient Care Team: Creola Corn, MD as PCP - General (Internal Medicine) [name], MD as PCP - Cardiology (Cardiology) [name], MD as PCP - Electrophysiology (Cardiology)  Extended Emergency Contact Information Primary Emergency Contact: Sofie Rower [place] Home Phone: [phone] Relation: Daughter Secondary Emergency Contact: Sowash,Danny Mobile Phone: [phone] Relation: Son  Code Status:  DNR Goals of care: Advanced Directive information    05/21/2023   10:24 AM  Advanced Directives  Does Patient Have a Medical Advance Directive? Yes  Type of Advance Directive Out of facility DNR (pink MOST or yellow form)  Does patient want to make changes to medical advance directive? No - Patient declined     Chief Complaint  Patient presents with   Medical Management of Chronic Issues    Medical Management of Chronic Issues.     HPI:  Pt is a 89 y.o. female seen today for medical management of chronic diseases.   She is so cold all the time   She had some burning with urination but it has gotten better.   She has no appetite.   She had hearing aids before, but think she could be tested here. She hasn't heard much since surgery.   Discussed patient's last set of labs within normal range for hemoglobin and kidney function.  Past Medical History:  Diagnosis Date   Back pain    Bruises easily    Coronary artery disease    NONOBSTRUCTIVE   Fatigue    Hyperlipidemia    Hypertension    Knee pain    OA (osteoarthritis)    Obesity    Palpitations    SOB (shortness of breath)    Past Surgical History:  Procedure Laterality Date   BREAST BIOPSY     LEFT BREAST   CARDIAC CATHETERIZATION  05/11/2006   OVERALL CARDIAC SIZE AND SILHOUETTE ARE NORMAL. EF ESTIMATED 60%    INTRAMEDULLARY (IM) NAIL INTERTROCHANTERIC Left 03/12/2023   Procedure: INTRAMEDULLARY (IM) NAIL INTERTROCHANTERIC;  Surgeon: Roby Lofts, MD;  Location: [facility] OR;  Service: Orthopedics;  Laterality: Left;   KIDNEY STONES  1982   KNEE ARTHROSCOPY  09/22/2008   VAGINAL HYSTERECTOMY      Allergies  Allergen Reactions   Codeine Other (See Comments)    Unknown reaction   Demerol [Meperidine Hcl] Other (See Comments)    Unknown reaction   Morphine And Codeine Other (See Comments)    Unknown reaction    Outpatient Encounter Medications as of 05/21/2023  Medication Sig   acetaminophen (TYLENOL) 325 MG tablet Take 650 mg by mouth every 6 (six) hours as needed.   ALPRAZolam (XANAX) 1 MG tablet Take 1 tablet (1 mg total) by mouth at bedtime.   alum & mag hydroxide-simeth (MAALOX PLUS) 400-400-40 MG/5ML suspension Take 5 mLs by mouth every 4 (four) hours as needed for indigestion.   apixaban (ELIQUIS) 5 MG TABS tablet Take 1 tablet (5 mg total) by mouth 2 (two) times daily.   bisacodyl (DULCOLAX) 10 MG suppository Place 10 mg rectally daily. As needed for constipation   ergocalciferol (VITAMIN D2) 50000 UNITS capsule Take 50,000 Units by mouth once a week.   fluconazole (DIFLUCAN) 200 MG tablet Take 1 tablet (200 mg total) by mouth daily.   furosemide (LASIX) 20 MG tablet TAKE 1 TABLET  EVERY DAY   Infant Care Products (DERMACLOUD EX) Apply to buttocks topically every shift.   metoprolol tartrate (LOPRESSOR) 100 MG tablet Take 100 mg by mouth 2 (two) times daily. Give 1/2 tablet (50mg ) by mouth every 24 hours as needed for palpitations.   nitroGLYCERIN (NITROSTAT) 0.4 MG SL tablet Place 1 tablet (0.4 mg total) under the tongue every 5 (five) minutes as needed for chest pain.   ondansetron (ZOFRAN) 4 MG tablet Take 4 mg by mouth every 8 (eight) hours as needed for nausea or vomiting.   polyethylene glycol (MIRALAX / GLYCOLAX) 17 g packet Take 17 g by mouth daily as needed for mild constipation.    traMADol (ULTRAM) 50 MG tablet Take 50 mg by mouth every 6 (six) hours as needed. Give 2 tablets by mouth every 6 hours as needed for severe pain.   [DISCONTINUED] conjugated estrogens (PREMARIN) vaginal cream Estrogen Cream Instruction Discard applicator Apply pea sized amount to tip of finger to urethra before bed. Wash hands well after application. Use Monday, Wednesday and Friday   [DISCONTINUED] doxycycline (ADOXA) 100 MG tablet Take 100 mg by mouth 2 (two) times daily.   [DISCONTINUED] sulfamethoxazole-trimethoprim (BACTRIM DS) 800-160 MG tablet Take 1 tablet by mouth 2 (two) times daily.   No facility-administered encounter medications on file as of 05/21/2023.    Review of Systems  Immunization History  Administered Date(s) Administered   Influenza, High Dose Seasonal PF 11/23/2022   PFIZER(Purple Top)SARS-COV-2 Vaccination 01/07/2020, 01/27/2020, 11/18/2020   Pertinent  Health Maintenance Due  Topic Date Due   DEXA SCAN  Never done   INFLUENZA VACCINE  07/19/2023       No data to display         Functional Status Survey:    Vitals:   05/21/23 1016  BP: 120/69  Pulse: 69  Resp: 18  Temp: (!) 96.9 F (36.1 C)  SpO2: 93%  Weight: 168 lb 3.2 oz (76.3 kg)  Height: 5\' 8"  (1.727 m)   Body mass index is 25.57 kg/m. Physical Exam HENT:     Ears:     Comments: Hard of hearing Cardiovascular:     Rate and Rhythm: Normal rate and regular rhythm.  Pulmonary:     Effort: Pulmonary effort is normal.     Breath sounds: Normal breath sounds.  Abdominal:     General: Abdomen is flat. Bowel sounds are normal.     Palpations: Abdomen is soft.  Musculoskeletal:        General: No swelling.  Skin:    General: Skin is warm.  Neurological:     Mental Status: She is alert and oriented to person, place, and time.     Labs reviewed: Recent Labs    03/12/23 0425 03/13/23 0156 03/14/23 0217 03/29/23 0000 04/18/23 0000 04/30/23 0000  NA 136 134* 133* 135* 133* 138  K  3.4* 3.9 4.2 4.1 3.5 3.6  CL 103 96* 95* 100 98* 97*  CO2 25 24 27  29* 30* 34*  GLUCOSE 187* 161* 169*  --   --   --   BUN 14 20 27* 17 14 23*  CREATININE 0.93 1.25* 1.03* 0.7 0.7 0.8  CALCIUM 8.8* 8.7* 8.5* 8.3* 8.9 8.4*   Recent Labs    03/13/23 0156 03/14/23 0217 03/29/23 0000  AST 29 20 15   ALT 13 10 6*  ALKPHOS 40 40 155*  BILITOT 0.4 0.8  --   PROT 5.8* 5.7*  --   ALBUMIN 2.8* 2.7*  2.7*   Recent Labs    03/13/23 0156 03/14/23 0217 03/15/23 0242 03/15/23 0926 03/29/23 0000 04/18/23 0000 04/30/23 0000  WBC 14.5* 15.7* 13.9*  --  7.1 9.2 8.3  NEUTROABS  --   --   --   --  4,672.00 6,201.00 4,930.00  HGB 10.3* 9.1* 8.8*   < > 10.6* 12.2 12.8  HCT 31.7* 27.9* 26.0*   < > 33* 38 40  MCV 94.1 94.3 93.2  --   --   --   --   PLT 232 218 250  --  406* 342 413*   < > = values in this interval not displayed.   Lab Results  Component Value Date   TSH 2.920 03/04/2018   Lab Results  Component Value Date   HGBA1C 6.9 (H) 03/12/2023   Lab Results  Component Value Date   CHOL 187 03/04/2018   HDL 44 03/04/2018   LDLCALC 90 03/04/2018   TRIG 267 (H) 03/04/2018   CHOLHDL 4.3 03/04/2018    Significant Diagnostic Results in last 30 days:  CT FEMUR LEFT W CONTRAST  Result Date: 04/25/2023 CLINICAL DATA:  Bleeding tenderness and redness at around in the left upper leg. Patient status post fixation of a left intertrochanteric fracture 03/07/2023. EXAM: CT OF THE LOWER LEFT EXTREMITY WITH CONTRAST TECHNIQUE: Multidetector CT imaging of the lower left extremity was performed according to the standard protocol following intravenous contrast administration. RADIATION DOSE REDUCTION: This exam was performed according to the departmental dose-optimization program which includes automated exposure control, adjustment of the mA and/or kV according to patient size and/or use of iterative reconstruction technique. CONTRAST:  100 mL OMNIPAQUE IOHEXOL 300 MG/ML  SOLN COMPARISON:  Plain  films left femur 03/12/2023. FINDINGS: Bones/Joint/Cartilage Intramedullary nail are over 2 hip screws and a single distal screw is in place for fixation of an intertrochanteric fracture. Hardware is intact without evidence of loosening. There is mild posterior displacement of the distal fracture fragment. The lesser trochanter is a separate fragment no bony destructive change or periosteal reaction is identified. No new fracture. Ligaments Suboptimally assessed by CT. Muscles and Tendons No intramuscular fluid collection or mass is seen. No gas within muscle or tracking along fascial planes is present. Soft tissues No focal fluid collection is identified. There is stranding in subcutaneous fat of the left thigh. No unexpected foreign body is seen. IMPRESSION: Negative for abscess, evidence of myositis or osteomyelitis. Stranding in subcutaneous fat could be postoperative, secondary to dependent change or cellulitis. Electronically Signed   By: Drusilla Kanner M.D.   On: 04/25/2023 11:05    Assessment/Plan 1. Moderate protein-calorie malnutrition (HCC) Patient has had 10 lb weight loss in the last 2 months. Multifactorial including improved swelling, poor appetite, as well as weakness after surgery. Most recent labs this month CBC and CMP wnl. Plan to order TSH. Weightloss could contribute to patient's cold intolerance. Will f/u labs. Protein supplementation with each meal.  2. Chronic heart failure with preserved ejection fraction (HFpEF) (HCC) Patient appears euvolemic on exam at this time. Continue Lasix and lopressor 100 mg daily.   3. Hypertension, unspecified type BP well-controlled on current regimen with lasix and lopressor.   4. Permanent atrial fibrillation (HCC) Rate controlled with lopressor. No signs of bleeding on chronic AC with eliquis.   5. Benzodiazepine dependence (HCC) Patient failed dose reduction, continue xanax f/u with potential dose reduction in a few months.   6. Stage 3a  chronic kidney disease (CKD) (HCC) Stable  on most recent labs. Avoid nephrotoxic medications.   7. Closed fracture of left hip with delayed healing, subsequent encounter DOI 03/12/23. Continues to require support for ADLs now living in [place] for long term care. PT as tolerated.    Family/ staff Communication: Daughter, Nelva Bush, nursing  Labs/tests ordered:  TSH   I spent greater than 30 minutes for the care of this patient in face to face time, chart review, clinical documentation, patient education.

## 2023-05-22 DIAGNOSIS — S72142D Displaced intertrochanteric fracture of left femur, subsequent encounter for closed fracture with routine healing: Secondary | ICD-10-CM | POA: Diagnosis not present

## 2023-05-24 ENCOUNTER — Other Ambulatory Visit: Payer: Self-pay | Admitting: Nurse Practitioner

## 2023-05-24 DIAGNOSIS — F1320 Sedative, hypnotic or anxiolytic dependence, uncomplicated: Secondary | ICD-10-CM

## 2023-05-24 MED ORDER — ALPRAZOLAM 1 MG PO TABS
1.0000 mg | ORAL_TABLET | Freq: Every day | ORAL | 0 refills | Status: DC
Start: 2023-05-24 — End: 2023-06-22

## 2023-05-28 DIAGNOSIS — Z9181 History of falling: Secondary | ICD-10-CM | POA: Diagnosis not present

## 2023-05-28 DIAGNOSIS — M6281 Muscle weakness (generalized): Secondary | ICD-10-CM | POA: Diagnosis not present

## 2023-05-28 DIAGNOSIS — S72002D Fracture of unspecified part of neck of left femur, subsequent encounter for closed fracture with routine healing: Secondary | ICD-10-CM | POA: Diagnosis not present

## 2023-05-28 DIAGNOSIS — Z741 Need for assistance with personal care: Secondary | ICD-10-CM | POA: Diagnosis not present

## 2023-05-28 DIAGNOSIS — S72142D Displaced intertrochanteric fracture of left femur, subsequent encounter for closed fracture with routine healing: Secondary | ICD-10-CM | POA: Diagnosis not present

## 2023-05-28 DIAGNOSIS — I130 Hypertensive heart and chronic kidney disease with heart failure and stage 1 through stage 4 chronic kidney disease, or unspecified chronic kidney disease: Secondary | ICD-10-CM | POA: Diagnosis not present

## 2023-05-30 DIAGNOSIS — S72142D Displaced intertrochanteric fracture of left femur, subsequent encounter for closed fracture with routine healing: Secondary | ICD-10-CM | POA: Diagnosis not present

## 2023-05-30 DIAGNOSIS — Z741 Need for assistance with personal care: Secondary | ICD-10-CM | POA: Diagnosis not present

## 2023-05-30 DIAGNOSIS — M6281 Muscle weakness (generalized): Secondary | ICD-10-CM | POA: Diagnosis not present

## 2023-05-30 DIAGNOSIS — S72002D Fracture of unspecified part of neck of left femur, subsequent encounter for closed fracture with routine healing: Secondary | ICD-10-CM | POA: Diagnosis not present

## 2023-05-30 DIAGNOSIS — Z9181 History of falling: Secondary | ICD-10-CM | POA: Diagnosis not present

## 2023-05-30 DIAGNOSIS — I130 Hypertensive heart and chronic kidney disease with heart failure and stage 1 through stage 4 chronic kidney disease, or unspecified chronic kidney disease: Secondary | ICD-10-CM | POA: Diagnosis not present

## 2023-06-01 DIAGNOSIS — M6281 Muscle weakness (generalized): Secondary | ICD-10-CM | POA: Diagnosis not present

## 2023-06-01 DIAGNOSIS — I130 Hypertensive heart and chronic kidney disease with heart failure and stage 1 through stage 4 chronic kidney disease, or unspecified chronic kidney disease: Secondary | ICD-10-CM | POA: Diagnosis not present

## 2023-06-01 DIAGNOSIS — S72142D Displaced intertrochanteric fracture of left femur, subsequent encounter for closed fracture with routine healing: Secondary | ICD-10-CM | POA: Diagnosis not present

## 2023-06-01 DIAGNOSIS — Z9181 History of falling: Secondary | ICD-10-CM | POA: Diagnosis not present

## 2023-06-01 DIAGNOSIS — S72002D Fracture of unspecified part of neck of left femur, subsequent encounter for closed fracture with routine healing: Secondary | ICD-10-CM | POA: Diagnosis not present

## 2023-06-01 DIAGNOSIS — Z741 Need for assistance with personal care: Secondary | ICD-10-CM | POA: Diagnosis not present

## 2023-06-08 ENCOUNTER — Encounter: Payer: Self-pay | Admitting: Student

## 2023-06-08 ENCOUNTER — Non-Acute Institutional Stay (SKILLED_NURSING_FACILITY): Payer: Medicare Other | Admitting: Student

## 2023-06-08 DIAGNOSIS — R600 Localized edema: Secondary | ICD-10-CM

## 2023-06-08 DIAGNOSIS — F1320 Sedative, hypnotic or anxiolytic dependence, uncomplicated: Secondary | ICD-10-CM

## 2023-06-08 DIAGNOSIS — E440 Moderate protein-calorie malnutrition: Secondary | ICD-10-CM

## 2023-06-08 DIAGNOSIS — I4821 Permanent atrial fibrillation: Secondary | ICD-10-CM

## 2023-06-08 DIAGNOSIS — S72002A Fracture of unspecified part of neck of left femur, initial encounter for closed fracture: Secondary | ICD-10-CM

## 2023-06-08 DIAGNOSIS — I5032 Chronic diastolic (congestive) heart failure: Secondary | ICD-10-CM | POA: Diagnosis not present

## 2023-06-08 DIAGNOSIS — N1831 Chronic kidney disease, stage 3a: Secondary | ICD-10-CM

## 2023-06-08 DIAGNOSIS — I10 Essential (primary) hypertension: Secondary | ICD-10-CM

## 2023-06-08 NOTE — Progress Notes (Unsigned)
Location:  [facility] [place].  Nursing Home Room Number: [facility] [address] Place of Service:  SNF [phone]) Provider:  Dr. [name]  PCP: Creola Corn, MD  Patient Care Team: Creola Corn, MD as PCP - General (Internal Medicine) [name], MD as PCP - Cardiology (Cardiology) [name], MD as PCP - Electrophysiology (Cardiology)  Extended Emergency Contact Information Primary Emergency Contact: Sofie Rower [place] Home Phone: [phone] Relation: Daughter Secondary Emergency Contact: Henault,Danny Mobile Phone: [phone] Relation: Son  Code Status:  DNR Goals of care: Advanced Directive information    06/08/2023    9:55 AM  Advanced Directives  Does Patient Have a Medical Advance Directive? Yes  Type of Advance Directive Out of facility DNR (pink MOST or yellow form)  Does patient want to make changes to medical advance directive? No - Patient declined     Chief Complaint  Patient presents with   Medical Management of Chronic Issues    Medical Management of Chronic Issues.     HPI:  Pt is a 89 y.o. female seen today for medical management of chronic diseases.    Patient states she is doing well in general. She has some trouble with keeping track of time. She is denies pain. Nursing with concern for increased swelling in the legs. Patient is compliant to compression, however, does not elevate feet when sitting. She continues to have a decreased appetite. Inconsistent bowel movements.    Past Medical History:  Diagnosis Date   Back pain    Bruises easily    Coronary artery disease    NONOBSTRUCTIVE   Fatigue    Hyperlipidemia    Hypertension    Knee pain    OA (osteoarthritis)    Obesity    Palpitations    SOB (shortness of breath)    Past Surgical History:  Procedure Laterality Date   BREAST BIOPSY     LEFT BREAST   CARDIAC CATHETERIZATION  05/11/2006   OVERALL CARDIAC SIZE AND SILHOUETTE ARE NORMAL. EF ESTIMATED  60%   INTRAMEDULLARY (IM) NAIL INTERTROCHANTERIC Left 03/12/2023   Procedure: INTRAMEDULLARY (IM) NAIL INTERTROCHANTERIC;  Surgeon: Roby Lofts, MD;  Location: [facility] OR;  Service: Orthopedics;  Laterality: Left;   KIDNEY STONES  1982   KNEE ARTHROSCOPY  09/22/2008   VAGINAL HYSTERECTOMY      Allergies  Allergen Reactions   Codeine Other (See Comments)    Unknown reaction   Demerol [Meperidine Hcl] Other (See Comments)    Unknown reaction   Morphine And Codeine Other (See Comments)    Unknown reaction    Outpatient Encounter Medications as of 06/08/2023  Medication Sig   acetaminophen (TYLENOL) 325 MG tablet Take 650 mg by mouth every 6 (six) hours as needed.   ALPRAZolam (XANAX) 1 MG tablet Take 1 tablet (1 mg total) by mouth at bedtime.   alum & mag hydroxide-simeth (MAALOX PLUS) 400-400-40 MG/5ML suspension Take 5 mLs by mouth every 4 (four) hours as needed for indigestion.   apixaban (ELIQUIS) 5 MG TABS tablet Take 1 tablet (5 mg total) by mouth 2 (two) times daily.   bisacodyl (DULCOLAX) 10 MG suppository Place 10 mg rectally daily. As needed for constipation   ergocalciferol (VITAMIN D2) 50000 UNITS capsule Take 50,000 Units by mouth once a week.   furosemide (LASIX) 20 MG tablet TAKE 1 TABLET EVERY DAY   Infant Care Products (DERMACLOUD EX) Apply to buttocks topically every shift.   metoprolol tartrate (LOPRESSOR) 100 MG  tablet Take 100 mg by mouth 2 (two) times daily. Give 1/2 tablet (50mg ) by mouth every 24 hours as needed for palpitations.   nitroGLYCERIN (NITROSTAT) 0.4 MG SL tablet Place 1 tablet (0.4 mg total) under the tongue every 5 (five) minutes as needed for chest pain.   ondansetron (ZOFRAN) 4 MG tablet Take 4 mg by mouth every 8 (eight) hours as needed for nausea or vomiting.   polyethylene glycol (MIRALAX / GLYCOLAX) 17 g packet Take 17 g by mouth daily as needed for mild constipation.   traMADol (ULTRAM) 50 MG tablet Take 50 mg by mouth every 6 (six) hours as  needed. Give 2 tablets by mouth every 6 hours as needed for severe pain.   [DISCONTINUED] fluconazole (DIFLUCAN) 200 MG tablet Take 1 tablet (200 mg total) by mouth daily.   No facility-administered encounter medications on file as of 06/08/2023.    Review of Systems  Immunization History  Administered Date(s) Administered   Influenza, High Dose Seasonal PF 11/23/2022   PFIZER(Purple Top)SARS-COV-2 Vaccination 01/07/2020, 01/27/2020, 11/18/2020   Pertinent  Health Maintenance Due  Topic Date Due   DEXA SCAN  Never done   INFLUENZA VACCINE  07/19/2023       No data to display         Functional Status Survey:    Vitals:   06/08/23 0948  BP: 120/69  Pulse: 69  Resp: 18  Temp: (!) 96.9 F (36.1 C)  SpO2: 93%  Weight: 168 lb 3.2 oz (76.3 kg)  Height: 5\' 6"  (1.676 m)   Body mass index is 27.15 kg/m. Physical Exam Cardiovascular:     Rate and Rhythm: Normal rate and regular rhythm.  Pulmonary:     Effort: Pulmonary effort is normal.     Breath sounds: Normal breath sounds.  Abdominal:     General: Abdomen is flat.     Palpations: Abdomen is soft.  Neurological:     Mental Status: She is alert. Mental status is at baseline.     Comments: Oriented to self, rehab center     Labs reviewed: Recent Labs    03/12/23 0425 03/13/23 0156 03/14/23 0217 03/29/23 0000 04/18/23 0000 04/30/23 0000  NA 136 134* 133* 135* 133* 138  K 3.4* 3.9 4.2 4.1 3.5 3.6  CL 103 96* 95* 100 98* 97*  CO2 25 24 27  29* 30* 34*  GLUCOSE 187* 161* 169*  --   --   --   BUN 14 20 27* 17 14 23*  CREATININE 0.93 1.25* 1.03* 0.7 0.7 0.8  CALCIUM 8.8* 8.7* 8.5* 8.3* 8.9 8.4*   Recent Labs    03/13/23 0156 03/14/23 0217 03/29/23 0000  AST 29 20 15   ALT 13 10 6*  ALKPHOS 40 40 155*  BILITOT 0.4 0.8  --   PROT 5.8* 5.7*  --   ALBUMIN 2.8* 2.7* 2.7*   Recent Labs    03/13/23 0156 03/14/23 0217 03/15/23 0242 03/15/23 0926 03/29/23 0000 04/18/23 0000 04/30/23 0000  WBC 14.5*  15.7* 13.9*  --  7.1 9.2 8.3  NEUTROABS  --   --   --   --  4,672.00 6,201.00 4,930.00  HGB 10.3* 9.1* 8.8*   < > 10.6* 12.2 12.8  HCT 31.7* 27.9* 26.0*   < > 33* 38 40  MCV 94.1 94.3 93.2  --   --   --   --   PLT 232 218 250  --  406* 342 413*   < > =  values in this interval not displayed.   Lab Results  Component Value Date   TSH 2.920 03/04/2018   Lab Results  Component Value Date   HGBA1C 6.9 (H) 03/12/2023   Lab Results  Component Value Date   CHOL 187 03/04/2018   HDL 44 03/04/2018   LDLCALC 90 03/04/2018   TRIG 267 (H) 03/04/2018   CHOLHDL 4.3 03/04/2018    Significant Diagnostic Results in last 30 days:  No results found.  Assessment/Plan Moderate protein-calorie malnutrition (HCC)  Chronic heart failure with preserved ejection fraction (HFpEF) (HCC)  Hypertension, unspecified type  Permanent atrial fibrillation (HCC)  Stage 3a chronic kidney disease (CKD) (HCC)  Bilateral edema of lower extremity  Benzodiazepine dependence (HCC)  Closed left hip fracture, initial encounter (HCC) Weight appears to be stabilzing since admission for hip fracture. Encourage protein supplementation with shakes, ice-cream, and meals high in protein. Increased leg swelling at this time, lungs clear. Additional dose of diuretic for three days. BP well-controlled at this time. Rate controlled. CKD stable. Patient continues on benzodiazepine. Will continue conversation regarding a slow taper.  Family/ staff Communication: nursing  Labs/tests ordered:  BMP 1 week, Pro BNP

## 2023-06-13 DIAGNOSIS — I130 Hypertensive heart and chronic kidney disease with heart failure and stage 1 through stage 4 chronic kidney disease, or unspecified chronic kidney disease: Secondary | ICD-10-CM | POA: Diagnosis not present

## 2023-06-13 DIAGNOSIS — Z9181 History of falling: Secondary | ICD-10-CM | POA: Diagnosis not present

## 2023-06-13 DIAGNOSIS — S72002D Fracture of unspecified part of neck of left femur, subsequent encounter for closed fracture with routine healing: Secondary | ICD-10-CM | POA: Diagnosis not present

## 2023-06-13 DIAGNOSIS — S72142D Displaced intertrochanteric fracture of left femur, subsequent encounter for closed fracture with routine healing: Secondary | ICD-10-CM | POA: Diagnosis not present

## 2023-06-13 DIAGNOSIS — M6281 Muscle weakness (generalized): Secondary | ICD-10-CM | POA: Diagnosis not present

## 2023-06-13 DIAGNOSIS — Z741 Need for assistance with personal care: Secondary | ICD-10-CM | POA: Diagnosis not present

## 2023-06-14 DIAGNOSIS — Z9181 History of falling: Secondary | ICD-10-CM | POA: Diagnosis not present

## 2023-06-14 DIAGNOSIS — Z741 Need for assistance with personal care: Secondary | ICD-10-CM | POA: Diagnosis not present

## 2023-06-14 DIAGNOSIS — S72002D Fracture of unspecified part of neck of left femur, subsequent encounter for closed fracture with routine healing: Secondary | ICD-10-CM | POA: Diagnosis not present

## 2023-06-14 DIAGNOSIS — M6281 Muscle weakness (generalized): Secondary | ICD-10-CM | POA: Diagnosis not present

## 2023-06-14 DIAGNOSIS — S72142D Displaced intertrochanteric fracture of left femur, subsequent encounter for closed fracture with routine healing: Secondary | ICD-10-CM | POA: Diagnosis not present

## 2023-06-14 DIAGNOSIS — I130 Hypertensive heart and chronic kidney disease with heart failure and stage 1 through stage 4 chronic kidney disease, or unspecified chronic kidney disease: Secondary | ICD-10-CM | POA: Diagnosis not present

## 2023-06-15 DIAGNOSIS — S72002D Fracture of unspecified part of neck of left femur, subsequent encounter for closed fracture with routine healing: Secondary | ICD-10-CM | POA: Diagnosis not present

## 2023-06-15 DIAGNOSIS — M6281 Muscle weakness (generalized): Secondary | ICD-10-CM | POA: Diagnosis not present

## 2023-06-15 DIAGNOSIS — S72142D Displaced intertrochanteric fracture of left femur, subsequent encounter for closed fracture with routine healing: Secondary | ICD-10-CM | POA: Diagnosis not present

## 2023-06-15 DIAGNOSIS — I130 Hypertensive heart and chronic kidney disease with heart failure and stage 1 through stage 4 chronic kidney disease, or unspecified chronic kidney disease: Secondary | ICD-10-CM | POA: Diagnosis not present

## 2023-06-15 DIAGNOSIS — Z741 Need for assistance with personal care: Secondary | ICD-10-CM | POA: Diagnosis not present

## 2023-06-15 DIAGNOSIS — Z9181 History of falling: Secondary | ICD-10-CM | POA: Diagnosis not present

## 2023-06-17 DIAGNOSIS — S72002D Fracture of unspecified part of neck of left femur, subsequent encounter for closed fracture with routine healing: Secondary | ICD-10-CM | POA: Diagnosis not present

## 2023-06-17 DIAGNOSIS — Z741 Need for assistance with personal care: Secondary | ICD-10-CM | POA: Diagnosis not present

## 2023-06-17 DIAGNOSIS — I130 Hypertensive heart and chronic kidney disease with heart failure and stage 1 through stage 4 chronic kidney disease, or unspecified chronic kidney disease: Secondary | ICD-10-CM | POA: Diagnosis not present

## 2023-06-17 DIAGNOSIS — Z9181 History of falling: Secondary | ICD-10-CM | POA: Diagnosis not present

## 2023-06-17 DIAGNOSIS — S72142D Displaced intertrochanteric fracture of left femur, subsequent encounter for closed fracture with routine healing: Secondary | ICD-10-CM | POA: Diagnosis not present

## 2023-06-17 DIAGNOSIS — M6281 Muscle weakness (generalized): Secondary | ICD-10-CM | POA: Diagnosis not present

## 2023-06-18 ENCOUNTER — Non-Acute Institutional Stay (SKILLED_NURSING_FACILITY): Payer: Medicare Other | Admitting: Student

## 2023-06-18 ENCOUNTER — Encounter: Payer: Self-pay | Admitting: Student

## 2023-06-18 DIAGNOSIS — Z8781 Personal history of (healed) traumatic fracture: Secondary | ICD-10-CM

## 2023-06-18 DIAGNOSIS — Z741 Need for assistance with personal care: Secondary | ICD-10-CM | POA: Diagnosis not present

## 2023-06-18 DIAGNOSIS — I5032 Chronic diastolic (congestive) heart failure: Secondary | ICD-10-CM | POA: Diagnosis not present

## 2023-06-18 DIAGNOSIS — F1320 Sedative, hypnotic or anxiolytic dependence, uncomplicated: Secondary | ICD-10-CM

## 2023-06-18 DIAGNOSIS — I4821 Permanent atrial fibrillation: Secondary | ICD-10-CM

## 2023-06-18 DIAGNOSIS — I130 Hypertensive heart and chronic kidney disease with heart failure and stage 1 through stage 4 chronic kidney disease, or unspecified chronic kidney disease: Secondary | ICD-10-CM | POA: Diagnosis not present

## 2023-06-18 DIAGNOSIS — R600 Localized edema: Secondary | ICD-10-CM

## 2023-06-18 DIAGNOSIS — Z9181 History of falling: Secondary | ICD-10-CM | POA: Diagnosis not present

## 2023-06-18 DIAGNOSIS — S72142D Displaced intertrochanteric fracture of left femur, subsequent encounter for closed fracture with routine healing: Secondary | ICD-10-CM | POA: Diagnosis not present

## 2023-06-18 DIAGNOSIS — S72002D Fracture of unspecified part of neck of left femur, subsequent encounter for closed fracture with routine healing: Secondary | ICD-10-CM | POA: Diagnosis not present

## 2023-06-18 DIAGNOSIS — M6281 Muscle weakness (generalized): Secondary | ICD-10-CM | POA: Diagnosis not present

## 2023-06-18 DIAGNOSIS — N1831 Chronic kidney disease, stage 3a: Secondary | ICD-10-CM

## 2023-06-18 DIAGNOSIS — I10 Essential (primary) hypertension: Secondary | ICD-10-CM

## 2023-06-18 DIAGNOSIS — E440 Moderate protein-calorie malnutrition: Secondary | ICD-10-CM

## 2023-06-18 NOTE — Progress Notes (Unsigned)
Location:  [facility] [place].  Nursing Home Room Number: [facility] [address] Place of Service:  SNF [phone]) Provider:  Dr. [name]  PCP: Creola Corn, MD  Patient Care Team: Creola Corn, MD as PCP - General (Internal Medicine) [name], MD as PCP - Cardiology (Cardiology) [name], MD as PCP - Electrophysiology (Cardiology)  Extended Emergency Contact Information Primary Emergency Contact: Sofie Rower [place] Home Phone: [phone] Relation: Daughter Secondary Emergency Contact: Maclachlan,Danny Mobile Phone: [phone] Relation: Son  Code Status:  DNR Goals of care: Advanced Directive information    06/18/2023   11:05 AM  Advanced Directives  Does Patient Have a Medical Advance Directive? Yes  Type of Advance Directive Out of facility DNR (pink MOST or yellow form)  Does patient want to make changes to medical advance directive? No - Patient declined     Chief Complaint  Patient presents with   Medical Management of Chronic Issues    Medical Management of Chronic Issues.     HPI:  Pt is a 89 y.o. female seen today for medical management of chronic diseases.    Patient states she is doing well except she continues to have have some swelling in her feet. She is eating well. Periodic constipation. Urinating well. Drinking plenty of fluids.   Nursing states patient's compression stockings have not been resized since she arrived in LTC.    Past Medical History:  Diagnosis Date   Back pain    Bruises easily    Coronary artery disease    NONOBSTRUCTIVE   Fatigue    Hyperlipidemia    Hypertension    Knee pain    OA (osteoarthritis)    Obesity    Palpitations    SOB (shortness of breath)    Past Surgical History:  Procedure Laterality Date   BREAST BIOPSY     LEFT BREAST   CARDIAC CATHETERIZATION  05/11/2006   OVERALL CARDIAC SIZE AND SILHOUETTE ARE NORMAL. EF ESTIMATED 60%   INTRAMEDULLARY (IM) NAIL INTERTROCHANTERIC  Left 03/12/2023   Procedure: INTRAMEDULLARY (IM) NAIL INTERTROCHANTERIC;  Surgeon: Roby Lofts, MD;  Location: [facility] OR;  Service: Orthopedics;  Laterality: Left;   KIDNEY STONES  1982   KNEE ARTHROSCOPY  09/22/2008   VAGINAL HYSTERECTOMY      Allergies  Allergen Reactions   Codeine Other (See Comments)    Unknown reaction   Demerol [Meperidine Hcl] Other (See Comments)    Unknown reaction   Morphine And Codeine Other (See Comments)    Unknown reaction    Outpatient Encounter Medications as of 06/18/2023  Medication Sig   acetaminophen (TYLENOL) 325 MG tablet Take 650 mg by mouth every 6 (six) hours as needed.   ALPRAZolam (XANAX) 1 MG tablet Take 1 tablet (1 mg total) by mouth at bedtime.   alum & mag hydroxide-simeth (MAALOX PLUS) 400-400-40 MG/5ML suspension Take 5 mLs by mouth every 4 (four) hours as needed for indigestion.   apixaban (ELIQUIS) 5 MG TABS tablet Take 1 tablet (5 mg total) by mouth 2 (two) times daily.   bisacodyl (DULCOLAX) 10 MG suppository Place 10 mg rectally daily. As needed for constipation   ergocalciferol (VITAMIN D2) 50000 UNITS capsule Take 50,000 Units by mouth once a week.   famotidine (PEPCID) 10 MG tablet Take 10 mg by mouth at bedtime.   furosemide (LASIX) 20 MG tablet TAKE 1 TABLET EVERY DAY   Infant Care Products (DERMACLOUD EX) Apply to buttocks topically every shift.  metoprolol tartrate (LOPRESSOR) 100 MG tablet Take 100 mg by mouth 2 (two) times daily. Give 1/2 tablet (50mg ) by mouth every 24 hours as needed for palpitations.   nitroGLYCERIN (NITROSTAT) 0.4 MG SL tablet Place 1 tablet (0.4 mg total) under the tongue every 5 (five) minutes as needed for chest pain.   ondansetron (ZOFRAN) 4 MG tablet Take 4 mg by mouth every 8 (eight) hours as needed for nausea or vomiting.   polyethylene glycol (MIRALAX / GLYCOLAX) 17 g packet Take 17 g by mouth daily as needed for mild constipation.   traMADol (ULTRAM) 50 MG tablet Take 50 mg by mouth every 6  (six) hours as needed. Give 2 tablets by mouth every 6 hours as needed for severe pain.   No facility-administered encounter medications on file as of 06/18/2023.    Review of Systems  Immunization History  Administered Date(s) Administered   Influenza, High Dose Seasonal PF 11/23/2022   PFIZER(Purple Top)SARS-COV-2 Vaccination 01/07/2020, 01/27/2020, 11/18/2020   Pertinent  Health Maintenance Due  Topic Date Due   DEXA SCAN  Never done   INFLUENZA VACCINE  07/19/2023       No data to display         Functional Status Survey:    Vitals:   06/18/23 1100  BP: 120/69  Pulse: 69  Resp: 18  Temp: (!) 96.9 F (36.1 C)  SpO2: 93%  Weight: 168 lb 3.2 oz (76.3 kg)  Height: 5\' 6"  (1.676 m)   Body mass index is 27.15 kg/m. Physical Exam Vitals reviewed.  Constitutional:      Appearance: Normal appearance.  Cardiovascular:     Rate and Rhythm: Normal rate and regular rhythm.     Pulses: Normal pulses.     Heart sounds: Normal heart sounds.  Pulmonary:     Effort: Pulmonary effort is normal.  Abdominal:     General: Abdomen is flat.     Palpations: Abdomen is soft.  Skin:    General: Skin is warm and dry.  Neurological:     Mental Status: She is alert and oriented to person, place, and time.     Labs reviewed: Recent Labs    03/12/23 0425 03/13/23 0156 03/14/23 0217 03/29/23 0000 04/18/23 0000 04/30/23 0000  NA 136 134* 133* 135* 133* 138  K 3.4* 3.9 4.2 4.1 3.5 3.6  CL 103 96* 95* 100 98* 97*  CO2 25 24 27  29* 30* 34*  GLUCOSE 187* 161* 169*  --   --   --   BUN 14 20 27* 17 14 23*  CREATININE 0.93 1.25* 1.03* 0.7 0.7 0.8  CALCIUM 8.8* 8.7* 8.5* 8.3* 8.9 8.4*   Recent Labs    03/13/23 0156 03/14/23 0217 03/29/23 0000  AST 29 20 15   ALT 13 10 6*  ALKPHOS 40 40 155*  BILITOT 0.4 0.8  --   PROT 5.8* 5.7*  --   ALBUMIN 2.8* 2.7* 2.7*   Recent Labs    03/13/23 0156 03/14/23 0217 03/15/23 0242 03/15/23 0926 03/29/23 0000 04/18/23 0000  04/30/23 0000  WBC 14.5* 15.7* 13.9*  --  7.1 9.2 8.3  NEUTROABS  --   --   --   --  4,672.00 6,201.00 4,930.00  HGB 10.3* 9.1* 8.8*   < > 10.6* 12.2 12.8  HCT 31.7* 27.9* 26.0*   < > 33* 38 40  MCV 94.1 94.3 93.2  --   --   --   --   PLT 232 218  250  --  406* 342 413*   < > = values in this interval not displayed.   Lab Results  Component Value Date   TSH 2.920 03/04/2018   Lab Results  Component Value Date   HGBA1C 6.9 (H) 03/12/2023   Lab Results  Component Value Date   CHOL 187 03/04/2018   HDL 44 03/04/2018   LDLCALC 90 03/04/2018   TRIG 267 (H) 03/04/2018   CHOLHDL 4.3 03/04/2018    Significant Diagnostic Results in last 30 days:  No results found.  Assessment/Plan Chronic heart failure with preserved ejection fraction (HFpEF) (HCC)  Permanent atrial fibrillation (HCC)  Stage 3a chronic kidney disease (CKD) (HCC)  Benzodiazepine dependence (HCC)  Hx of fracture of left hip  Bilateral edema of lower extremity  Moderate protein-calorie malnutrition (HCC)  Hypertension, unspecified type Patient's weight is now stabilized. 20 lb weight loss since arrival to facility. Resize compression stockings. Continue diuretics as previously prescribed. Rate controlled, continue eliquis. Unable to titrate anxiety medication at this time, work on dose reduction of xanax at follow up. Continue physical therapy for hip fracture which occurred on 3/24. No further hallucinations. Continue protein supplementation as tolerated. BP well-controlled on current regimen. F/u 2 mo  Family/ staff Communication: nursing  Labs/tests ordered:  none

## 2023-06-19 ENCOUNTER — Encounter: Payer: Self-pay | Admitting: Student

## 2023-06-19 DIAGNOSIS — Z8781 Personal history of (healed) traumatic fracture: Secondary | ICD-10-CM | POA: Insufficient documentation

## 2023-06-19 DIAGNOSIS — F1320 Sedative, hypnotic or anxiolytic dependence, uncomplicated: Secondary | ICD-10-CM | POA: Insufficient documentation

## 2023-06-22 ENCOUNTER — Other Ambulatory Visit: Payer: Self-pay | Admitting: Student

## 2023-06-22 DIAGNOSIS — F1320 Sedative, hypnotic or anxiolytic dependence, uncomplicated: Secondary | ICD-10-CM

## 2023-06-22 MED ORDER — ALPRAZOLAM 1 MG PO TABS
1.0000 mg | ORAL_TABLET | Freq: Every day | ORAL | 0 refills | Status: DC
Start: 2023-06-22 — End: 2023-06-25

## 2023-06-22 NOTE — Progress Notes (Signed)
Refill Chronic anxiety meds

## 2023-06-25 ENCOUNTER — Other Ambulatory Visit: Payer: Self-pay | Admitting: Nurse Practitioner

## 2023-06-25 ENCOUNTER — Other Ambulatory Visit: Payer: Self-pay | Admitting: Student

## 2023-06-25 DIAGNOSIS — F1320 Sedative, hypnotic or anxiolytic dependence, uncomplicated: Secondary | ICD-10-CM

## 2023-06-25 MED ORDER — ALPRAZOLAM 1 MG PO TABS
1.0000 mg | ORAL_TABLET | Freq: Every day | ORAL | 0 refills | Status: DC
Start: 2023-06-25 — End: 2023-07-29

## 2023-06-25 NOTE — Progress Notes (Signed)
Xanax sent on 7/5 to [facility].

## 2023-06-28 DIAGNOSIS — M6281 Muscle weakness (generalized): Secondary | ICD-10-CM | POA: Diagnosis not present

## 2023-06-28 DIAGNOSIS — S72002D Fracture of unspecified part of neck of left femur, subsequent encounter for closed fracture with routine healing: Secondary | ICD-10-CM | POA: Diagnosis not present

## 2023-06-28 DIAGNOSIS — Z741 Need for assistance with personal care: Secondary | ICD-10-CM | POA: Diagnosis not present

## 2023-06-28 DIAGNOSIS — I130 Hypertensive heart and chronic kidney disease with heart failure and stage 1 through stage 4 chronic kidney disease, or unspecified chronic kidney disease: Secondary | ICD-10-CM | POA: Diagnosis not present

## 2023-06-28 DIAGNOSIS — Z9181 History of falling: Secondary | ICD-10-CM | POA: Diagnosis not present

## 2023-06-28 DIAGNOSIS — S72142D Displaced intertrochanteric fracture of left femur, subsequent encounter for closed fracture with routine healing: Secondary | ICD-10-CM | POA: Diagnosis not present

## 2023-06-28 DIAGNOSIS — E890 Postprocedural hypothyroidism: Secondary | ICD-10-CM | POA: Diagnosis not present

## 2023-06-28 DIAGNOSIS — E119 Type 2 diabetes mellitus without complications: Secondary | ICD-10-CM | POA: Diagnosis not present

## 2023-06-28 LAB — HEMOGLOBIN A1C: Hemoglobin A1C: 6.4

## 2023-06-28 LAB — TSH: TSH: 4.75 (ref 0.41–5.90)

## 2023-07-02 ENCOUNTER — Encounter: Payer: Self-pay | Admitting: Student

## 2023-07-02 ENCOUNTER — Non-Acute Institutional Stay (SKILLED_NURSING_FACILITY): Payer: Medicare Other | Admitting: Student

## 2023-07-02 DIAGNOSIS — R600 Localized edema: Secondary | ICD-10-CM | POA: Diagnosis not present

## 2023-07-02 DIAGNOSIS — Z9181 History of falling: Secondary | ICD-10-CM | POA: Diagnosis not present

## 2023-07-02 DIAGNOSIS — S72142D Displaced intertrochanteric fracture of left femur, subsequent encounter for closed fracture with routine healing: Secondary | ICD-10-CM | POA: Diagnosis not present

## 2023-07-02 DIAGNOSIS — Z741 Need for assistance with personal care: Secondary | ICD-10-CM | POA: Diagnosis not present

## 2023-07-02 DIAGNOSIS — S72002D Fracture of unspecified part of neck of left femur, subsequent encounter for closed fracture with routine healing: Secondary | ICD-10-CM | POA: Diagnosis not present

## 2023-07-02 DIAGNOSIS — N3090 Cystitis, unspecified without hematuria: Secondary | ICD-10-CM | POA: Diagnosis not present

## 2023-07-02 DIAGNOSIS — M6281 Muscle weakness (generalized): Secondary | ICD-10-CM | POA: Diagnosis not present

## 2023-07-02 DIAGNOSIS — I130 Hypertensive heart and chronic kidney disease with heart failure and stage 1 through stage 4 chronic kidney disease, or unspecified chronic kidney disease: Secondary | ICD-10-CM | POA: Diagnosis not present

## 2023-07-02 DIAGNOSIS — N390 Urinary tract infection, site not specified: Secondary | ICD-10-CM | POA: Diagnosis not present

## 2023-07-02 NOTE — Progress Notes (Signed)
Location:  [facility] [place].  Nursing Home Room Number: [facility] [address] Place of Service:  SNF [phone]) Provider:  [name], MD  Patient Care Team: [name], MD as PCP - General (Family Medicine) [name], MD as PCP - Cardiology (Cardiology) [name], MD as PCP - Electrophysiology (Cardiology)  Extended Emergency Contact Information Primary Emergency Contact: Sofie Rower [place] Home Phone: [phone] Relation: Daughter Secondary Emergency Contact: Bran,Danny Mobile Phone: [phone] Relation: Son  Code Status:  DNR Goals of care: Advanced Directive information    07/05/2023    1:26 PM  Advanced Directives  Does Patient Have a Medical Advance Directive? Yes  Type of Advance Directive Out of facility DNR (pink MOST or yellow form)  Does patient want to make changes to medical advance directive? No - Patient declined     Chief Complaint  Patient presents with   Acute Visit    Leg Weeping.     HPI:  Pt is a 89 y.o. female seen today for an acute visit for Leg Weeping.   Patient has had increased swelling in lower extremities. Some pain in the foot due to extent of swelling.   Dysuria through the weekend concerned for cystitis.  Past Medical History:  Diagnosis Date   Back pain    Bruises easily    Coronary artery disease    NONOBSTRUCTIVE   Fatigue    Hyperlipidemia    Hypertension    Knee pain    OA (osteoarthritis)    Obesity    Palpitations    SOB (shortness of breath)    Past Surgical History:  Procedure Laterality Date   BREAST BIOPSY     LEFT BREAST   CARDIAC CATHETERIZATION  05/11/2006   OVERALL CARDIAC SIZE AND SILHOUETTE ARE NORMAL. EF ESTIMATED 60%   INTRAMEDULLARY (IM) NAIL INTERTROCHANTERIC Left 03/12/2023   Procedure: INTRAMEDULLARY (IM) NAIL INTERTROCHANTERIC;  Surgeon: Roby Lofts, MD;  Location: [facility] OR;  Service: Orthopedics;  Laterality: Left;   KIDNEY STONES  1982   KNEE  ARTHROSCOPY  09/22/2008   VAGINAL HYSTERECTOMY      Allergies  Allergen Reactions   Codeine Other (See Comments)    Unknown reaction   Demerol [Meperidine Hcl] Other (See Comments)    Unknown reaction   Morphine And Codeine Other (See Comments)    Unknown reaction    Outpatient Encounter Medications as of 07/02/2023  Medication Sig   acetaminophen (TYLENOL) 325 MG tablet Take 650 mg by mouth every 6 (six) hours as needed.   ALPRAZolam (XANAX) 1 MG tablet Take 1 tablet (1 mg total) by mouth at bedtime.   alum & mag hydroxide-simeth (MAALOX PLUS) 400-400-40 MG/5ML suspension Take 5 mLs by mouth every 4 (four) hours as needed for indigestion.   apixaban (ELIQUIS) 5 MG TABS tablet Take 1 tablet (5 mg total) by mouth 2 (two) times daily.   bisacodyl (DULCOLAX) 10 MG suppository Place 10 mg rectally daily. As needed for constipation   ergocalciferol (VITAMIN D2) 50000 UNITS capsule Take 50,000 Units by mouth once a week.   famotidine (PEPCID) 10 MG tablet Take 10 mg by mouth at bedtime.   furosemide (LASIX) 20 MG tablet TAKE 1 TABLET EVERY DAY   Infant Care Products (DERMACLOUD EX) Apply to buttocks topically every shift.   metoprolol tartrate (LOPRESSOR) 100 MG tablet Take 100 mg by mouth 2 (two) times daily.   nitroGLYCERIN (NITROSTAT) 0.4 MG SL tablet Place 1 tablet (0.4 mg total)  under the tongue every 5 (five) minutes as needed for chest pain.   ondansetron (ZOFRAN) 4 MG tablet Take 4 mg by mouth every 8 (eight) hours as needed for nausea or vomiting.   traMADol (ULTRAM) 50 MG tablet Take 50 mg by mouth every 6 (six) hours as needed. Give 2 tablets by mouth every 6 hours as needed for severe pain.   [DISCONTINUED] polyethylene glycol (MIRALAX / GLYCOLAX) 17 g packet Take 17 g by mouth daily as needed for mild constipation. (Patient taking differently: Take 17 g by mouth daily as needed for mild constipation. See other listing for scheduled dosing)   No facility-administered encounter  medications on file as of 07/02/2023.    Review of Systems  Immunization History  Administered Date(s) Administered   Influenza, High Dose Seasonal PF 11/23/2022   PFIZER(Purple Top)SARS-COV-2 Vaccination 01/07/2020, 01/27/2020, 11/18/2020   Pertinent  Health Maintenance Due  Topic Date Due   DEXA SCAN  Never done   INFLUENZA VACCINE  07/19/2023       No data to display         Functional Status Survey:    Vitals:   07/02/23 0949 07/02/23 0957  BP: (!) 170/78 120/69  Pulse: (!) 53   Resp: 16   Temp: (!) 97.3 F (36.3 C)   SpO2: 94%   Weight: 175 lb (79.4 kg)   Height: 5\' 6"  (1.676 m)    Body mass index is 28.25 kg/m. Physical Exam Constitutional:      Appearance: Normal appearance.  Cardiovascular:     Rate and Rhythm: Normal rate and regular rhythm.  Musculoskeletal:        General: Swelling present.     Comments: Draiage from lower extremities  Neurological:     Mental Status: She is alert.     Labs reviewed: Recent Labs    03/12/23 0425 03/13/23 0156 03/14/23 0217 03/29/23 0000 04/18/23 0000 04/30/23 0000  NA 136 134* 133* 135* 133* 138  K 3.4* 3.9 4.2 4.1 3.5 3.6  CL 103 96* 95* 100 98* 97*  CO2 25 24 27  29* 30* 34*  GLUCOSE 187* 161* 169*  --   --   --   BUN 14 20 27* 17 14 23*  CREATININE 0.93 1.25* 1.03* 0.7 0.7 0.8  CALCIUM 8.8* 8.7* 8.5* 8.3* 8.9 8.4*   Recent Labs    03/13/23 0156 03/14/23 0217 03/29/23 0000  AST 29 20 15   ALT 13 10 6*  ALKPHOS 40 40 155*  BILITOT 0.4 0.8  --   PROT 5.8* 5.7*  --   ALBUMIN 2.8* 2.7* 2.7*   Recent Labs    03/13/23 0156 03/14/23 0217 03/15/23 0242 03/15/23 0926 03/29/23 0000 04/18/23 0000 04/30/23 0000 07/05/23 0000  WBC 14.5* 15.7* 13.9*   < > 7.1 9.2 8.3 7.5  NEUTROABS  --   --   --   --  4,672.00 6,201.00 4,930.00  --   HGB 10.3* 9.1* 8.8*   < > 10.6* 12.2 12.8 12.4  HCT 31.7* 27.9* 26.0*   < > 33* 38 40 38  MCV 94.1 94.3 93.2  --   --   --   --   --   PLT 232 218 250  --  406*  342 413* 270   < > = values in this interval not displayed.   Lab Results  Component Value Date   TSH 4.75 06/28/2023   Lab Results  Component Value Date   HGBA1C 6.4 06/28/2023  Lab Results  Component Value Date   CHOL 187 03/04/2018   HDL 44 03/04/2018   LDLCALC 90 03/04/2018   TRIG 267 (H) 03/04/2018   CHOLHDL 4.3 03/04/2018    Significant Diagnostic Results in last 30 days:  No results found.  Assessment/Plan Bilateral edema of lower extremity  Cystitis Start Additional lasix daily for 3 days. Continue elevation and compression as tolerated. UA Cx and CBC ordered, will treat based on cultures.   Family/ staff Communication: nursing  Labs/tests ordered:  UA Cx CBC

## 2023-07-05 ENCOUNTER — Non-Acute Institutional Stay (SKILLED_NURSING_FACILITY): Payer: Medicare Other | Admitting: Adult Health

## 2023-07-05 ENCOUNTER — Encounter: Payer: Self-pay | Admitting: Adult Health

## 2023-07-05 DIAGNOSIS — S72142D Displaced intertrochanteric fracture of left femur, subsequent encounter for closed fracture with routine healing: Secondary | ICD-10-CM | POA: Diagnosis not present

## 2023-07-05 DIAGNOSIS — L820 Inflamed seborrheic keratosis: Secondary | ICD-10-CM | POA: Diagnosis not present

## 2023-07-05 DIAGNOSIS — I4821 Permanent atrial fibrillation: Secondary | ICD-10-CM | POA: Diagnosis not present

## 2023-07-05 DIAGNOSIS — I5032 Chronic diastolic (congestive) heart failure: Secondary | ICD-10-CM | POA: Diagnosis not present

## 2023-07-05 DIAGNOSIS — I10 Essential (primary) hypertension: Secondary | ICD-10-CM | POA: Diagnosis not present

## 2023-07-05 DIAGNOSIS — Z9181 History of falling: Secondary | ICD-10-CM | POA: Diagnosis not present

## 2023-07-05 DIAGNOSIS — L814 Other melanin hyperpigmentation: Secondary | ICD-10-CM | POA: Diagnosis not present

## 2023-07-05 DIAGNOSIS — N390 Urinary tract infection, site not specified: Secondary | ICD-10-CM

## 2023-07-05 DIAGNOSIS — S72002D Fracture of unspecified part of neck of left femur, subsequent encounter for closed fracture with routine healing: Secondary | ICD-10-CM | POA: Diagnosis not present

## 2023-07-05 DIAGNOSIS — L821 Other seborrheic keratosis: Secondary | ICD-10-CM | POA: Diagnosis not present

## 2023-07-05 DIAGNOSIS — I130 Hypertensive heart and chronic kidney disease with heart failure and stage 1 through stage 4 chronic kidney disease, or unspecified chronic kidney disease: Secondary | ICD-10-CM | POA: Diagnosis not present

## 2023-07-05 DIAGNOSIS — M6281 Muscle weakness (generalized): Secondary | ICD-10-CM | POA: Diagnosis not present

## 2023-07-05 DIAGNOSIS — Z741 Need for assistance with personal care: Secondary | ICD-10-CM | POA: Diagnosis not present

## 2023-07-05 LAB — CBC AND DIFFERENTIAL
HCT: 38 (ref 36–46)
Hemoglobin: 12.4 (ref 12.0–16.0)
Platelets: 270 10*3/uL (ref 150–400)
WBC: 7.5

## 2023-07-05 LAB — CBC: RBC: 4.22 (ref 3.87–5.11)

## 2023-07-05 NOTE — Progress Notes (Unsigned)
Location:  Other [facility]) Nursing Home Room Number: 419 a Place of Service:  SNF (31) Provider:  Kenard Gower, DNP, FNP-BC  Patient Care Team: [name], MD as PCP - General (Family Medicine) [name], MD as PCP - Cardiology (Cardiology) [name], MD as PCP - Electrophysiology (Cardiology)  Extended Emergency Contact Information Primary Emergency Contact: Sofie Rower [place] Home Phone: [phone] Relation: Daughter Secondary Emergency Contact: Sinopoli,Danny Mobile Phone: [phone] Relation: Son  Code Status:  DNR  Goals of care: Advanced Directive information    07/05/2023    1:26 PM  Advanced Directives  Does Patient Have a Medical Advance Directive? Yes  Type of Advance Directive Out of facility DNR (pink MOST or yellow form)  Does patient want to make changes to medical advance directive? No - Patient declined     Chief Complaint  Patient presents with   Acute Visit    Abnormal urine culture     HPI:  Pt is a 89 y.o. female seen today for medical management of chronic diseases.  ***   Past Medical History:  Diagnosis Date   Back pain    Bruises easily    Coronary artery disease    NONOBSTRUCTIVE   Fatigue    Hyperlipidemia    Hypertension    Knee pain    OA (osteoarthritis)    Obesity    Palpitations    SOB (shortness of breath)    Past Surgical History:  Procedure Laterality Date   BREAST BIOPSY     LEFT BREAST   CARDIAC CATHETERIZATION  05/11/2006   OVERALL CARDIAC SIZE AND SILHOUETTE ARE NORMAL. EF ESTIMATED 60%   INTRAMEDULLARY (IM) NAIL INTERTROCHANTERIC Left 03/12/2023   Procedure: INTRAMEDULLARY (IM) NAIL INTERTROCHANTERIC;  Surgeon: Roby Lofts, MD;  Location: [facility] OR;  Service: Orthopedics;  Laterality: Left;   KIDNEY STONES  1982   KNEE ARTHROSCOPY  09/22/2008   VAGINAL HYSTERECTOMY      Allergies  Allergen Reactions   Codeine Other (See Comments)    Unknown  reaction   Demerol [Meperidine Hcl] Other (See Comments)    Unknown reaction   Morphine And Codeine Other (See Comments)    Unknown reaction    Outpatient Encounter Medications as of 07/05/2023  Medication Sig   acetaminophen (TYLENOL) 325 MG tablet Take 650 mg by mouth every 6 (six) hours as needed.   ALPRAZolam (XANAX) 1 MG tablet Take 1 tablet (1 mg total) by mouth at bedtime.   alum & mag hydroxide-simeth (MAALOX PLUS) 400-400-40 MG/5ML suspension Take 5 mLs by mouth every 4 (four) hours as needed for indigestion.   apixaban (ELIQUIS) 5 MG TABS tablet Take 1 tablet (5 mg total) by mouth 2 (two) times daily.   bisacodyl (DULCOLAX) 10 MG suppository Place 10 mg rectally daily. As needed for constipation   ergocalciferol (VITAMIN D2) 50000 UNITS capsule Take 50,000 Units by mouth once a week.   famotidine (PEPCID) 10 MG tablet Take 10 mg by mouth at bedtime.   furosemide (LASIX) 20 MG tablet TAKE 1 TABLET EVERY DAY   Infant Care Products (DERMACLOUD EX) Apply to buttocks topically every shift.   metoprolol tartrate (LOPRESSOR) 100 MG tablet Take 100 mg by mouth 2 (two) times daily.   metoprolol tartrate (LOPRESSOR) 50 MG tablet Take 50 mg by mouth as directed. Every 24 hours as needed for heart palpitations   nitrofurantoin, macrocrystal-monohydrate, (MACROBID) 100 MG capsule Take 100 mg by mouth 2 (two) times  daily.   nitroGLYCERIN (NITROSTAT) 0.4 MG SL tablet Place 1 tablet (0.4 mg total) under the tongue every 5 (five) minutes as needed for chest pain.   ondansetron (ZOFRAN) 4 MG tablet Take 4 mg by mouth every 8 (eight) hours as needed for nausea or vomiting.   polyethylene glycol (MIRALAX / GLYCOLAX) 17 g packet Take 17 g by mouth daily. And every 24 hours as needed for constipation   traMADol (ULTRAM) 50 MG tablet Take 50 mg by mouth every 6 (six) hours as needed. Give 2 tablets by mouth every 6 hours as needed for severe pain.   [DISCONTINUED] polyethylene glycol (MIRALAX / GLYCOLAX)  17 g packet Take 17 g by mouth daily as needed for mild constipation. (Patient taking differently: Take 17 g by mouth daily as needed for mild constipation. See other listing for scheduled dosing)   No facility-administered encounter medications on file as of 07/05/2023.    Review of Systems ***    Immunization History  Administered Date(s) Administered   Influenza, High Dose Seasonal PF 11/23/2022   PFIZER(Purple Top)SARS-COV-2 Vaccination 01/07/2020, 01/27/2020, 11/18/2020   Pertinent  Health Maintenance Due  Topic Date Due   DEXA SCAN  Never done   INFLUENZA VACCINE  07/19/2023       No data to display           There were no vitals filed for this visit. There is no height or weight on file to calculate BMI.  Physical Exam     Labs reviewed: Recent Labs    03/12/23 0425 03/13/23 0156 03/14/23 0217 03/29/23 0000 04/18/23 0000 04/30/23 0000  NA 136 134* 133* 135* 133* 138  K 3.4* 3.9 4.2 4.1 3.5 3.6  CL 103 96* 95* 100 98* 97*  CO2 25 24 27  29* 30* 34*  GLUCOSE 187* 161* 169*  --   --   --   BUN 14 20 27* 17 14 23*  CREATININE 0.93 1.25* 1.03* 0.7 0.7 0.8  CALCIUM 8.8* 8.7* 8.5* 8.3* 8.9 8.4*   Recent Labs    03/13/23 0156 03/14/23 0217 03/29/23 0000  AST 29 20 15   ALT 13 10 6*  ALKPHOS 40 40 155*  BILITOT 0.4 0.8  --   PROT 5.8* 5.7*  --   ALBUMIN 2.8* 2.7* 2.7*   Recent Labs    03/13/23 0156 03/14/23 0217 03/15/23 0242 03/15/23 0926 03/29/23 0000 04/18/23 0000 04/30/23 0000 07/05/23 0000  WBC 14.5* 15.7* 13.9*   < > 7.1 9.2 8.3 7.5  NEUTROABS  --   --   --   --  4,672.00 6,201.00 4,930.00  --   HGB 10.3* 9.1* 8.8*   < > 10.6* 12.2 12.8 12.4  HCT 31.7* 27.9* 26.0*   < > 33* 38 40 38  MCV 94.1 94.3 93.2  --   --   --   --   --   PLT 232 218 250  --  406* 342 413* 270   < > = values in this interval not displayed.   Lab Results  Component Value Date   TSH 4.75 06/28/2023   Lab Results  Component Value Date   HGBA1C 6.4 06/28/2023    Lab Results  Component Value Date   CHOL 187 03/04/2018   HDL 44 03/04/2018   LDLCALC 90 03/04/2018   TRIG 267 (H) 03/04/2018   CHOLHDL 4.3 03/04/2018    Significant Diagnostic Results in last 30 days:  No results found.  Assessment/Plan ***  Family/ staff Communication: Discussed plan of care with resident and charge nurse  Labs/tests ordered:     Kenard Gower, DNP, MSN, FNP-BC [facility] [phone] (Monday-Friday 8:00 a.m. - 5:00 p.m.) [phone] (after hours)

## 2023-07-06 DIAGNOSIS — M6281 Muscle weakness (generalized): Secondary | ICD-10-CM | POA: Diagnosis not present

## 2023-07-06 DIAGNOSIS — S72002D Fracture of unspecified part of neck of left femur, subsequent encounter for closed fracture with routine healing: Secondary | ICD-10-CM | POA: Diagnosis not present

## 2023-07-06 DIAGNOSIS — Z9181 History of falling: Secondary | ICD-10-CM | POA: Diagnosis not present

## 2023-07-06 DIAGNOSIS — I130 Hypertensive heart and chronic kidney disease with heart failure and stage 1 through stage 4 chronic kidney disease, or unspecified chronic kidney disease: Secondary | ICD-10-CM | POA: Diagnosis not present

## 2023-07-06 DIAGNOSIS — S72142D Displaced intertrochanteric fracture of left femur, subsequent encounter for closed fracture with routine healing: Secondary | ICD-10-CM | POA: Diagnosis not present

## 2023-07-06 DIAGNOSIS — Z741 Need for assistance with personal care: Secondary | ICD-10-CM | POA: Diagnosis not present

## 2023-07-10 DIAGNOSIS — I130 Hypertensive heart and chronic kidney disease with heart failure and stage 1 through stage 4 chronic kidney disease, or unspecified chronic kidney disease: Secondary | ICD-10-CM | POA: Diagnosis not present

## 2023-07-10 DIAGNOSIS — M6281 Muscle weakness (generalized): Secondary | ICD-10-CM | POA: Diagnosis not present

## 2023-07-10 DIAGNOSIS — S72142D Displaced intertrochanteric fracture of left femur, subsequent encounter for closed fracture with routine healing: Secondary | ICD-10-CM | POA: Diagnosis not present

## 2023-07-10 DIAGNOSIS — Z9181 History of falling: Secondary | ICD-10-CM | POA: Diagnosis not present

## 2023-07-10 DIAGNOSIS — S72002D Fracture of unspecified part of neck of left femur, subsequent encounter for closed fracture with routine healing: Secondary | ICD-10-CM | POA: Diagnosis not present

## 2023-07-10 DIAGNOSIS — Z741 Need for assistance with personal care: Secondary | ICD-10-CM | POA: Diagnosis not present

## 2023-07-12 DIAGNOSIS — Z741 Need for assistance with personal care: Secondary | ICD-10-CM | POA: Diagnosis not present

## 2023-07-12 DIAGNOSIS — M6281 Muscle weakness (generalized): Secondary | ICD-10-CM | POA: Diagnosis not present

## 2023-07-12 DIAGNOSIS — S72002D Fracture of unspecified part of neck of left femur, subsequent encounter for closed fracture with routine healing: Secondary | ICD-10-CM | POA: Diagnosis not present

## 2023-07-12 DIAGNOSIS — I130 Hypertensive heart and chronic kidney disease with heart failure and stage 1 through stage 4 chronic kidney disease, or unspecified chronic kidney disease: Secondary | ICD-10-CM | POA: Diagnosis not present

## 2023-07-12 DIAGNOSIS — Z9181 History of falling: Secondary | ICD-10-CM | POA: Diagnosis not present

## 2023-07-12 DIAGNOSIS — S72142D Displaced intertrochanteric fracture of left femur, subsequent encounter for closed fracture with routine healing: Secondary | ICD-10-CM | POA: Diagnosis not present

## 2023-07-17 DIAGNOSIS — S72142D Displaced intertrochanteric fracture of left femur, subsequent encounter for closed fracture with routine healing: Secondary | ICD-10-CM | POA: Diagnosis not present

## 2023-07-17 DIAGNOSIS — M6281 Muscle weakness (generalized): Secondary | ICD-10-CM | POA: Diagnosis not present

## 2023-07-17 DIAGNOSIS — I130 Hypertensive heart and chronic kidney disease with heart failure and stage 1 through stage 4 chronic kidney disease, or unspecified chronic kidney disease: Secondary | ICD-10-CM | POA: Diagnosis not present

## 2023-07-17 DIAGNOSIS — S72002D Fracture of unspecified part of neck of left femur, subsequent encounter for closed fracture with routine healing: Secondary | ICD-10-CM | POA: Diagnosis not present

## 2023-07-17 DIAGNOSIS — Z9181 History of falling: Secondary | ICD-10-CM | POA: Diagnosis not present

## 2023-07-17 DIAGNOSIS — Z741 Need for assistance with personal care: Secondary | ICD-10-CM | POA: Diagnosis not present

## 2023-07-19 ENCOUNTER — Encounter: Payer: Self-pay | Admitting: Nurse Practitioner

## 2023-07-19 ENCOUNTER — Non-Acute Institutional Stay (SKILLED_NURSING_FACILITY): Payer: Medicare Other | Admitting: Nurse Practitioner

## 2023-07-19 DIAGNOSIS — T148XXA Other injury of unspecified body region, initial encounter: Secondary | ICD-10-CM | POA: Diagnosis not present

## 2023-07-19 DIAGNOSIS — Z9181 History of falling: Secondary | ICD-10-CM | POA: Diagnosis not present

## 2023-07-19 DIAGNOSIS — M6281 Muscle weakness (generalized): Secondary | ICD-10-CM | POA: Diagnosis not present

## 2023-07-19 DIAGNOSIS — S72142D Displaced intertrochanteric fracture of left femur, subsequent encounter for closed fracture with routine healing: Secondary | ICD-10-CM | POA: Diagnosis not present

## 2023-07-19 DIAGNOSIS — S72002D Fracture of unspecified part of neck of left femur, subsequent encounter for closed fracture with routine healing: Secondary | ICD-10-CM | POA: Diagnosis not present

## 2023-07-19 DIAGNOSIS — I130 Hypertensive heart and chronic kidney disease with heart failure and stage 1 through stage 4 chronic kidney disease, or unspecified chronic kidney disease: Secondary | ICD-10-CM | POA: Diagnosis not present

## 2023-07-19 DIAGNOSIS — I5032 Chronic diastolic (congestive) heart failure: Secondary | ICD-10-CM | POA: Diagnosis not present

## 2023-07-19 DIAGNOSIS — Z741 Need for assistance with personal care: Secondary | ICD-10-CM | POA: Diagnosis not present

## 2023-07-19 DIAGNOSIS — E1122 Type 2 diabetes mellitus with diabetic chronic kidney disease: Secondary | ICD-10-CM | POA: Diagnosis not present

## 2023-07-19 DIAGNOSIS — R2689 Other abnormalities of gait and mobility: Secondary | ICD-10-CM | POA: Diagnosis not present

## 2023-07-19 NOTE — Progress Notes (Signed)
Location:  Other [facility].  Nursing Home Room Number: [facility] 419A Place of Service:  SNF [phone]) [name], NP  PCP: [name], MD  Patient Care Team: [name], MD as PCP - General (Family Medicine) [name], MD as PCP - Cardiology (Cardiology) [name], MD as PCP - Electrophysiology (Cardiology)  Extended Emergency Contact Information Primary Emergency Contact: Sofie Rower [place] Home Phone: [phone] Relation: Daughter Secondary Emergency Contact: TanyaDanny Mobile Phone: [phone] Relation: Son  Goals of care: Advanced Directive information    07/19/2023    2:39 PM  Advanced Directives  Does Patient Have a Medical Advance Directive? Yes  Type of Advance Directive Out of facility DNR (pink MOST or yellow form)  Does patient want to make changes to medical advance directive? No - Patient declined     Chief Complaint  Patient presents with   Acute Visit    Abrasion    HPI:  Pt is a 89 y.o. female seen today for an acute visit for place on her lower arm.  Staff noted that she has area on her left lower arm that she has been picking at and not healing.  Pt reports she saw a provider (but could not say who) that told her area needed to be frozen off she reports then area scabbed over and now the scab has come off.  States it has not healed and been there for 3 weeks.  No pain or drainage.    Past Medical History:  Diagnosis Date   Back pain    Bruises easily    Coronary artery disease    NONOBSTRUCTIVE   Fatigue    Hyperlipidemia    Hypertension    Knee pain    OA (osteoarthritis)    Obesity    Palpitations    SOB (shortness of breath)    Past Surgical History:  Procedure Laterality Date   BREAST BIOPSY     LEFT BREAST   CARDIAC CATHETERIZATION  05/11/2006   OVERALL CARDIAC SIZE AND SILHOUETTE ARE NORMAL. EF ESTIMATED 60%   INTRAMEDULLARY (IM) NAIL INTERTROCHANTERIC Left 03/12/2023    Procedure: INTRAMEDULLARY (IM) NAIL INTERTROCHANTERIC;  Surgeon: Roby Lofts, MD;  Location: [facility] OR;  Service: Orthopedics;  Laterality: Left;   KIDNEY STONES  1982   KNEE ARTHROSCOPY  09/22/2008   VAGINAL HYSTERECTOMY      Allergies  Allergen Reactions   Codeine Other (See Comments)    Unknown reaction   Demerol [Meperidine Hcl] Other (See Comments)    Unknown reaction   Morphine And Codeine Other (See Comments)    Unknown reaction    Outpatient Encounter Medications as of 07/19/2023  Medication Sig   acetaminophen (TYLENOL) 325 MG tablet Take 650 mg by mouth every 6 (six) hours as needed.   ALPRAZolam (XANAX) 1 MG tablet Take 1 tablet (1 mg total) by mouth at bedtime.   alum & mag hydroxide-simeth (MAALOX PLUS) 400-400-40 MG/5ML suspension Take 5 mLs by mouth every 4 (four) hours as needed for indigestion.   apixaban (ELIQUIS) 5 MG TABS tablet Take 1 tablet (5 mg total) by mouth 2 (two) times daily.   bisacodyl (DULCOLAX) 10 MG suppository Place 10 mg rectally daily. As needed for constipation   ergocalciferol (VITAMIN D2) 50000 UNITS capsule Take 50,000 Units by mouth once a week.   famotidine (PEPCID) 10 MG tablet Take 10 mg by mouth at bedtime.   furosemide (LASIX) 20 MG tablet TAKE 1 TABLET EVERY  DAY   Infant Care Products [facility] EX) Apply to buttocks topically every shift.   metoprolol tartrate (LOPRESSOR) 100 MG tablet Take 100 mg by mouth 2 (two) times daily.   metoprolol tartrate (LOPRESSOR) 50 MG tablet Take 50 mg by mouth as directed. Every 24 hours as needed for heart palpitations   nitroGLYCERIN (NITROSTAT) 0.4 MG SL tablet Place 1 tablet (0.4 mg total) under the tongue every 5 (five) minutes as needed for chest pain.   ondansetron (ZOFRAN) 4 MG tablet Take 4 mg by mouth every 8 (eight) hours as needed for nausea or vomiting.   polyethylene glycol (MIRALAX / GLYCOLAX) 17 g packet Take 17 g by mouth daily. And every 24 hours as needed for constipation   traMADol  (ULTRAM) 50 MG tablet Take 50 mg by mouth every 6 (six) hours as needed. Give 2 tablets by mouth every 6 hours as needed for severe pain.   Zinc Oxide (TRIPLE PASTE) 12.8 % ointment Apply 1 Application topically. To peri area and skin folds topically twice daily   No facility-administered encounter medications on file as of 07/19/2023.    Review of Systems  Skin:  Positive for wound.    Immunization History  Administered Date(s) Administered   Influenza, High Dose Seasonal PF 11/23/2022   PFIZER(Purple Top)SARS-COV-2 Vaccination 01/07/2020, 01/27/2020, 11/18/2020   Pertinent  Health Maintenance Due  Topic Date Due   DEXA SCAN  Never done   INFLUENZA VACCINE  07/19/2023       No data to display         Functional Status Survey:    Vitals:   07/19/23 1433 07/19/23 1450  BP: (!) 167/72 (!) 143/77  Pulse: 63   Resp: 16   Temp: (!) 97.4 F (36.3 C)   SpO2: 94%   Weight: 181 lb 6.4 oz (82.3 kg)   Height: 5\' 6"  (1.676 m)    Body mass index is 29.28 kg/m. Physical Exam Constitutional:      Appearance: Normal appearance.  Pulmonary:     Effort: Pulmonary effort is normal.  Skin:    General: Skin is warm and dry.     Comments: 1.5 cm X 1 cm abrasion noted to left lower arm. Color is pink and does not have any drainage or erythema noted  Neurological:     Mental Status: She is alert. Mental status is at baseline.  Psychiatric:        Mood and Affect: Mood normal.     Labs reviewed: Recent Labs    03/12/23 0425 03/13/23 0156 03/14/23 0217 03/29/23 0000 04/18/23 0000 04/30/23 0000  NA 136 134* 133* 135* 133* 138  K 3.4* 3.9 4.2 4.1 3.5 3.6  CL 103 96* 95* 100 98* 97*  CO2 25 24 27  29* 30* 34*  GLUCOSE 187* 161* 169*  --   --   --   BUN 14 20 27* 17 14 23*  CREATININE 0.93 1.25* 1.03* 0.7 0.7 0.8  CALCIUM 8.8* 8.7* 8.5* 8.3* 8.9 8.4*   Recent Labs    03/13/23 0156 03/14/23 0217 03/29/23 0000  AST 29 20 15   ALT 13 10 6*  ALKPHOS 40 40 155*  BILITOT  0.4 0.8  --   PROT 5.8* 5.7*  --   ALBUMIN 2.8* 2.7* 2.7*   Recent Labs    03/13/23 0156 03/14/23 0217 03/15/23 0242 03/15/23 0926 03/29/23 0000 04/18/23 0000 04/30/23 0000 07/05/23 0000  WBC 14.5* 15.7* 13.9*   < > 7.1 9.2 8.3  7.5  NEUTROABS  --   --   --   --  4,672.00 6,201.00 4,930.00  --   HGB 10.3* 9.1* 8.8*   < > 10.6* 12.2 12.8 12.4  HCT 31.7* 27.9* 26.0*   < > 33* 38 40 38  MCV 94.1 94.3 93.2  --   --   --   --   --   PLT 232 218 250  --  406* 342 413* 270   < > = values in this interval not displayed.   Lab Results  Component Value Date   TSH 4.75 06/28/2023   Lab Results  Component Value Date   HGBA1C 6.4 06/28/2023   Lab Results  Component Value Date   CHOL 187 03/04/2018   HDL 44 03/04/2018   LDLCALC 90 03/04/2018   TRIG 267 (H) 03/04/2018   CHOLHDL 4.3 03/04/2018    Significant Diagnostic Results in last 30 days:  No results found.  Assessment/Plan 1. Abrasion -will have staff apply xeroform and change every 2 days until heals. To notify if area does not heal or worsen. Also to notify for signs of infection.  Will have area covered so she will not pick at area which nursing suspects she has been doing leading to delayed healing.   Janene Harvey. [facility] [phone]

## 2023-07-23 DIAGNOSIS — S72142D Displaced intertrochanteric fracture of left femur, subsequent encounter for closed fracture with routine healing: Secondary | ICD-10-CM | POA: Diagnosis not present

## 2023-07-23 DIAGNOSIS — M6281 Muscle weakness (generalized): Secondary | ICD-10-CM | POA: Diagnosis not present

## 2023-07-23 DIAGNOSIS — S72002D Fracture of unspecified part of neck of left femur, subsequent encounter for closed fracture with routine healing: Secondary | ICD-10-CM | POA: Diagnosis not present

## 2023-07-23 DIAGNOSIS — Z9181 History of falling: Secondary | ICD-10-CM | POA: Diagnosis not present

## 2023-07-23 DIAGNOSIS — Z741 Need for assistance with personal care: Secondary | ICD-10-CM | POA: Diagnosis not present

## 2023-07-23 DIAGNOSIS — I130 Hypertensive heart and chronic kidney disease with heart failure and stage 1 through stage 4 chronic kidney disease, or unspecified chronic kidney disease: Secondary | ICD-10-CM | POA: Diagnosis not present

## 2023-07-24 DIAGNOSIS — Z9181 History of falling: Secondary | ICD-10-CM | POA: Diagnosis not present

## 2023-07-24 DIAGNOSIS — Z741 Need for assistance with personal care: Secondary | ICD-10-CM | POA: Diagnosis not present

## 2023-07-24 DIAGNOSIS — S72002D Fracture of unspecified part of neck of left femur, subsequent encounter for closed fracture with routine healing: Secondary | ICD-10-CM | POA: Diagnosis not present

## 2023-07-24 DIAGNOSIS — I130 Hypertensive heart and chronic kidney disease with heart failure and stage 1 through stage 4 chronic kidney disease, or unspecified chronic kidney disease: Secondary | ICD-10-CM | POA: Diagnosis not present

## 2023-07-24 DIAGNOSIS — M6281 Muscle weakness (generalized): Secondary | ICD-10-CM | POA: Diagnosis not present

## 2023-07-24 DIAGNOSIS — S72142D Displaced intertrochanteric fracture of left femur, subsequent encounter for closed fracture with routine healing: Secondary | ICD-10-CM | POA: Diagnosis not present

## 2023-07-26 DIAGNOSIS — R300 Dysuria: Secondary | ICD-10-CM | POA: Diagnosis not present

## 2023-07-29 ENCOUNTER — Other Ambulatory Visit: Payer: Self-pay | Admitting: Student

## 2023-07-29 ENCOUNTER — Telehealth: Payer: Self-pay | Admitting: Family

## 2023-07-29 ENCOUNTER — Other Ambulatory Visit: Payer: Self-pay | Admitting: Family

## 2023-07-29 DIAGNOSIS — F1320 Sedative, hypnotic or anxiolytic dependence, uncomplicated: Secondary | ICD-10-CM

## 2023-07-29 MED ORDER — ALPRAZOLAM 1 MG PO TABS
1.0000 mg | ORAL_TABLET | Freq: Every day | ORAL | 0 refills | Status: DC
Start: 2023-07-29 — End: 2023-08-17

## 2023-07-29 NOTE — Telephone Encounter (Signed)
FYIShona Ho facility nurse called requesting refill on alprazolam 1 mg at bedtime state patient has been out of medication.  Refills sent to pharmacy.

## 2023-07-30 NOTE — Telephone Encounter (Signed)
Duplicate

## 2023-08-01 DIAGNOSIS — S72142D Displaced intertrochanteric fracture of left femur, subsequent encounter for closed fracture with routine healing: Secondary | ICD-10-CM | POA: Diagnosis not present

## 2023-08-01 DIAGNOSIS — I130 Hypertensive heart and chronic kidney disease with heart failure and stage 1 through stage 4 chronic kidney disease, or unspecified chronic kidney disease: Secondary | ICD-10-CM | POA: Diagnosis not present

## 2023-08-01 DIAGNOSIS — Z741 Need for assistance with personal care: Secondary | ICD-10-CM | POA: Diagnosis not present

## 2023-08-01 DIAGNOSIS — S72002D Fracture of unspecified part of neck of left femur, subsequent encounter for closed fracture with routine healing: Secondary | ICD-10-CM | POA: Diagnosis not present

## 2023-08-01 DIAGNOSIS — M6281 Muscle weakness (generalized): Secondary | ICD-10-CM | POA: Diagnosis not present

## 2023-08-01 DIAGNOSIS — Z9181 History of falling: Secondary | ICD-10-CM | POA: Diagnosis not present

## 2023-08-02 DIAGNOSIS — L853 Xerosis cutis: Secondary | ICD-10-CM | POA: Diagnosis not present

## 2023-08-02 DIAGNOSIS — L820 Inflamed seborrheic keratosis: Secondary | ICD-10-CM | POA: Diagnosis not present

## 2023-08-10 ENCOUNTER — Ambulatory Visit: Payer: Medicare Other | Admitting: Cardiology

## 2023-08-13 DIAGNOSIS — I130 Hypertensive heart and chronic kidney disease with heart failure and stage 1 through stage 4 chronic kidney disease, or unspecified chronic kidney disease: Secondary | ICD-10-CM | POA: Diagnosis not present

## 2023-08-13 DIAGNOSIS — Z9181 History of falling: Secondary | ICD-10-CM | POA: Diagnosis not present

## 2023-08-13 DIAGNOSIS — M6281 Muscle weakness (generalized): Secondary | ICD-10-CM | POA: Diagnosis not present

## 2023-08-13 DIAGNOSIS — Z741 Need for assistance with personal care: Secondary | ICD-10-CM | POA: Diagnosis not present

## 2023-08-13 DIAGNOSIS — S72142D Displaced intertrochanteric fracture of left femur, subsequent encounter for closed fracture with routine healing: Secondary | ICD-10-CM | POA: Diagnosis not present

## 2023-08-13 DIAGNOSIS — S72002D Fracture of unspecified part of neck of left femur, subsequent encounter for closed fracture with routine healing: Secondary | ICD-10-CM | POA: Diagnosis not present

## 2023-08-16 ENCOUNTER — Encounter: Payer: Self-pay | Admitting: Nurse Practitioner

## 2023-08-16 ENCOUNTER — Non-Acute Institutional Stay (SKILLED_NURSING_FACILITY): Payer: Medicare Other | Admitting: Nurse Practitioner

## 2023-08-16 DIAGNOSIS — F1320 Sedative, hypnotic or anxiolytic dependence, uncomplicated: Secondary | ICD-10-CM

## 2023-08-16 DIAGNOSIS — N1831 Chronic kidney disease, stage 3a: Secondary | ICD-10-CM

## 2023-08-16 DIAGNOSIS — R600 Localized edema: Secondary | ICD-10-CM

## 2023-08-16 DIAGNOSIS — I5032 Chronic diastolic (congestive) heart failure: Secondary | ICD-10-CM

## 2023-08-16 DIAGNOSIS — I4821 Permanent atrial fibrillation: Secondary | ICD-10-CM

## 2023-08-16 DIAGNOSIS — I10 Essential (primary) hypertension: Secondary | ICD-10-CM

## 2023-08-16 DIAGNOSIS — K5904 Chronic idiopathic constipation: Secondary | ICD-10-CM

## 2023-08-16 DIAGNOSIS — K219 Gastro-esophageal reflux disease without esophagitis: Secondary | ICD-10-CM

## 2023-08-16 DIAGNOSIS — E559 Vitamin D deficiency, unspecified: Secondary | ICD-10-CM

## 2023-08-16 NOTE — Progress Notes (Signed)
Location:  Other [facility].  Nursing Home Room Number: [facility] [address] Place of Service:  SNF [phone]) [name], NP  PCP: [name], MD  Patient Care Team: [name], MD as PCP - General (Family Medicine) [name], MD as PCP - Cardiology (Cardiology) [name], MD as PCP - Electrophysiology (Cardiology)  Extended Emergency Contact Information Primary Emergency Contact: Sofie Rower [place] Home Phone: [phone] Relation: Daughter Secondary Emergency Contact: Alonge,Danny Mobile Phone: [phone] Relation: Son  Goals of care: Advanced Directive information    08/16/2023    3:03 PM  Advanced Directives  Does Patient Have a Medical Advance Directive? Yes  Type of Advance Directive Out of facility DNR (pink MOST or yellow form)  Does patient want to make changes to medical advance directive? No - Patient declined     Chief Complaint  Patient presents with   Medical Management of Chronic Issues    Medical Management of Chronic Issues.     HPI:  Pt is a 89 y.o. female seen today for medical management of chronic disease. She tested positive for COVID and completed antiviral today.  Reports rare cough but no congestion  No shortness of breath  Some runny nose   Denies constipation- reports bowels moving well on current regimen  Denies any pain or discomfort.   Reports she is HOH  No increase in anxiety at this time.   GERD controlled at this time  No worsening of LE edema but reports legs are very tender.    Past Medical History:  Diagnosis Date   Back pain    Bruises easily    Coronary artery disease    NONOBSTRUCTIVE   Fatigue    Hyperlipidemia    Hypertension    Knee pain    OA (osteoarthritis)    Obesity    Palpitations    SOB (shortness of breath)    Past Surgical History:  Procedure Laterality Date   BREAST BIOPSY     LEFT BREAST   CARDIAC CATHETERIZATION  05/11/2006   OVERALL  CARDIAC SIZE AND SILHOUETTE ARE NORMAL. EF ESTIMATED 60%   INTRAMEDULLARY (IM) NAIL INTERTROCHANTERIC Left 03/12/2023   Procedure: INTRAMEDULLARY (IM) NAIL INTERTROCHANTERIC;  Surgeon: Roby Lofts, MD;  Location: [facility] OR;  Service: Orthopedics;  Laterality: Left;   KIDNEY STONES  1982   KNEE ARTHROSCOPY  09/22/2008   VAGINAL HYSTERECTOMY      Allergies  Allergen Reactions   Codeine Other (See Comments)    Unknown reaction   Demerol [Meperidine Hcl] Other (See Comments)    Unknown reaction   Morphine And Codeine Other (See Comments)    Unknown reaction    Outpatient Encounter Medications as of 08/16/2023  Medication Sig   acetaminophen (TYLENOL) 325 MG tablet Take 650 mg by mouth every 6 (six) hours as needed.   acetaminophen (TYLENOL) 325 MG tablet Take 650 mg by mouth every 4 (four) hours as needed.   ALPRAZolam (XANAX) 1 MG tablet TAKE 1 TABLET BY MOUTH EVERY NIGHT AT BEDTIME   ALPRAZolam (XANAX) 1 MG tablet Take 1 tablet (1 mg total) by mouth at bedtime.   alum & mag hydroxide-simeth (MAALOX PLUS) 400-400-40 MG/5ML suspension Take 5 mLs by mouth every 4 (four) hours as needed for indigestion.   apixaban (ELIQUIS) 5 MG TABS tablet Take 1 tablet (5 mg total) by mouth 2 (two) times daily.   bisacodyl (DULCOLAX) 10 MG suppository Place 10 mg rectally daily. As needed for constipation  ergocalciferol (VITAMIN D2) 50000 UNITS capsule Take 50,000 Units by mouth once a week.   estradiol (ESTRACE) 0.1 MG/GM vaginal cream Place 1 Applicatorful vaginally. At bedtime every Tuesday and Friday   famotidine (PEPCID) 10 MG tablet Take 10 mg by mouth at bedtime.   furosemide (LASIX) 20 MG tablet TAKE 1 TABLET EVERY DAY   Infant Care Products (DERMACLOUD EX) Apply to buttocks topically every shift.   metoprolol tartrate (LOPRESSOR) 100 MG tablet Take 100 mg by mouth 2 (two) times daily.   metoprolol tartrate (LOPRESSOR) 50 MG tablet Take 50 mg by mouth as directed. Every 24 hours as needed for  heart palpitations   MOLNUPIRAVIR PO Take 800 mg by mouth 2 (two) times daily.   nitroGLYCERIN (NITROSTAT) 0.4 MG SL tablet Place 1 tablet (0.4 mg total) under the tongue every 5 (five) minutes as needed for chest pain.   ondansetron (ZOFRAN) 4 MG tablet Take 4 mg by mouth every 8 (eight) hours as needed for nausea or vomiting.   polyethylene glycol (MIRALAX / GLYCOLAX) 17 g packet Take 17 g by mouth daily. And every 24 hours as needed for constipation   traMADol (ULTRAM) 50 MG tablet Take 50 mg by mouth every 6 (six) hours as needed. Give 2 tablets by mouth every 6 hours as needed for severe pain.   Zinc Oxide (TRIPLE PASTE) 12.8 % ointment Apply 1 Application topically. To peri area and skin folds topically twice daily   No facility-administered encounter medications on file as of 08/16/2023.    Review of Systems  Constitutional:  Negative for activity change, appetite change, fatigue and unexpected weight change.  HENT:  Positive for rhinorrhea. Negative for congestion and hearing loss.   Eyes: Negative.   Respiratory:  Positive for cough. Negative for shortness of breath.   Cardiovascular:  Positive for leg swelling. Negative for chest pain and palpitations.  Gastrointestinal:  Negative for abdominal pain, constipation and diarrhea.  Genitourinary:  Negative for difficulty urinating and dysuria.  Musculoskeletal:  Negative for arthralgias and myalgias.  Skin:  Negative for color change and wound.  Neurological:  Negative for dizziness and weakness.  Psychiatric/Behavioral:  Negative for agitation, behavioral problems and confusion.      Immunization History  Administered Date(s) Administered   Influenza, High Dose Seasonal PF 11/23/2022   PFIZER(Purple Top)SARS-COV-2 Vaccination 01/07/2020, 01/27/2020, 11/18/2020   Pertinent  Health Maintenance Due  Topic Date Due   DEXA SCAN  Never done   INFLUENZA VACCINE  07/19/2023       No data to display         Functional Status  Survey:    Vitals:   08/16/23 1452 08/16/23 1505  BP: (!) 150/82 128/66  Pulse: (!) 55   Resp: 18   Temp: (!) 97.1 F (36.2 C)   SpO2: 97%   Weight: 176 lb 3.2 oz (79.9 kg)   Height: 5\' 6"  (1.676 m)    Body mass index is 28.44 kg/m. Physical Exam Constitutional:      General: She is not in acute distress.    Appearance: She is well-developed. She is not diaphoretic.  HENT:     Head: Normocephalic and atraumatic.     Mouth/Throat:     Pharynx: No oropharyngeal exudate.  Eyes:     Conjunctiva/sclera: Conjunctivae normal.     Pupils: Pupils are equal, round, and reactive to light.  Cardiovascular:     Rate and Rhythm: Normal rate. Rhythm irregular.     Heart sounds:  Normal heart sounds.  Pulmonary:     Effort: Pulmonary effort is normal.     Breath sounds: Normal breath sounds.  Abdominal:     General: Bowel sounds are normal.     Palpations: Abdomen is soft.  Musculoskeletal:     Cervical back: Normal range of motion and neck supple.     Right lower leg: No edema.     Left lower leg: No edema.  Skin:    General: Skin is warm and dry.  Neurological:     Mental Status: She is alert.  Psychiatric:        Mood and Affect: Mood normal.     Labs reviewed: Recent Labs    03/12/23 0425 03/13/23 0156 03/14/23 0217 03/29/23 0000 04/18/23 0000 04/30/23 0000  NA 136 134* 133* 135* 133* 138  K 3.4* 3.9 4.2 4.1 3.5 3.6  CL 103 96* 95* 100 98* 97*  CO2 25 24 27  29* 30* 34*  GLUCOSE 187* 161* 169*  --   --   --   BUN 14 20 27* 17 14 23*  CREATININE 0.93 1.25* 1.03* 0.7 0.7 0.8  CALCIUM 8.8* 8.7* 8.5* 8.3* 8.9 8.4*   Recent Labs    03/13/23 0156 03/14/23 0217 03/29/23 0000  AST 29 20 15   ALT 13 10 6*  ALKPHOS 40 40 155*  BILITOT 0.4 0.8  --   PROT 5.8* 5.7*  --   ALBUMIN 2.8* 2.7* 2.7*   Recent Labs    03/13/23 0156 03/14/23 0217 03/15/23 0242 03/15/23 0926 03/29/23 0000 04/18/23 0000 04/30/23 0000 07/05/23 0000  WBC 14.5* 15.7* 13.9*   < > 7.1  9.2 8.3 7.5  NEUTROABS  --   --   --   --  4,672.00 6,201.00 4,930.00  --   HGB 10.3* 9.1* 8.8*   < > 10.6* 12.2 12.8 12.4  HCT 31.7* 27.9* 26.0*   < > 33* 38 40 38  MCV 94.1 94.3 93.2  --   --   --   --   --   PLT 232 218 250  --  406* 342 413* 270   < > = values in this interval not displayed.   Lab Results  Component Value Date   TSH 4.75 06/28/2023   Lab Results  Component Value Date   HGBA1C 6.4 06/28/2023   Lab Results  Component Value Date   CHOL 187 03/04/2018   HDL 44 03/04/2018   LDLCALC 90 03/04/2018   TRIG 267 (H) 03/04/2018   CHOLHDL 4.3 03/04/2018    Significant Diagnostic Results in last 30 days:  No results found.  Assessment/Plan 1. Permanent atrial fibrillation (HCC) -rate controlled. On metoprolol Continues on eliquis for anticoagulation   2. Chronic heart failure with preserved ejection fraction (HFpEF) (HCC) Euvolemic continues on lopressor and lasix  3. Bilateral edema of lower extremity --encouraged to elevate legs above level of heart as tolerates, low sodium diet, compression hose as tolerates (on in am, off in pm) Continues on lasix  4. Stage 3a chronic kidney disease (CKD) (HCC) -Chronic and stable Encourage proper hydration Follow metabolic panel Avoid nephrotoxic meds (NSAIDS)  5. Hypertension, unspecified type -Blood pressure well controlled, goal bp <140/90 Continue current medications and dietary modifications follow metabolic panel  6. Chronic idiopathic constipation Well controlled on miralax daily  7. Vitamin D deficiency Continues on supplement   8. Gastroesophageal reflux disease without esophagitis Controlled on prilosec  9. Benzodiazepine dependence (HCC) Continues on xanax 1 mg  at bedtime.   Janene Harvey. [facility] [phone]

## 2023-08-17 ENCOUNTER — Other Ambulatory Visit: Payer: Self-pay | Admitting: Student

## 2023-08-17 DIAGNOSIS — F1320 Sedative, hypnotic or anxiolytic dependence, uncomplicated: Secondary | ICD-10-CM

## 2023-08-17 MED ORDER — ALPRAZOLAM 0.5 MG PO TABS
0.5000 mg | ORAL_TABLET | Freq: Every day | ORAL | 0 refills | Status: DC
Start: 2023-08-17 — End: 2023-10-03

## 2023-08-17 NOTE — Progress Notes (Signed)
Trial of dose reduction of anxiety medication.

## 2023-08-20 DIAGNOSIS — I130 Hypertensive heart and chronic kidney disease with heart failure and stage 1 through stage 4 chronic kidney disease, or unspecified chronic kidney disease: Secondary | ICD-10-CM | POA: Diagnosis not present

## 2023-08-20 DIAGNOSIS — S72002D Fracture of unspecified part of neck of left femur, subsequent encounter for closed fracture with routine healing: Secondary | ICD-10-CM | POA: Diagnosis not present

## 2023-08-20 DIAGNOSIS — E1122 Type 2 diabetes mellitus with diabetic chronic kidney disease: Secondary | ICD-10-CM | POA: Diagnosis not present

## 2023-08-20 DIAGNOSIS — S72142D Displaced intertrochanteric fracture of left femur, subsequent encounter for closed fracture with routine healing: Secondary | ICD-10-CM | POA: Diagnosis not present

## 2023-08-20 DIAGNOSIS — M6281 Muscle weakness (generalized): Secondary | ICD-10-CM | POA: Diagnosis not present

## 2023-08-20 DIAGNOSIS — Z741 Need for assistance with personal care: Secondary | ICD-10-CM | POA: Diagnosis not present

## 2023-08-20 DIAGNOSIS — R2689 Other abnormalities of gait and mobility: Secondary | ICD-10-CM | POA: Diagnosis not present

## 2023-08-20 DIAGNOSIS — Z9181 History of falling: Secondary | ICD-10-CM | POA: Diagnosis not present

## 2023-08-20 DIAGNOSIS — I5032 Chronic diastolic (congestive) heart failure: Secondary | ICD-10-CM | POA: Diagnosis not present

## 2023-08-22 DIAGNOSIS — I130 Hypertensive heart and chronic kidney disease with heart failure and stage 1 through stage 4 chronic kidney disease, or unspecified chronic kidney disease: Secondary | ICD-10-CM | POA: Diagnosis not present

## 2023-08-22 DIAGNOSIS — M6281 Muscle weakness (generalized): Secondary | ICD-10-CM | POA: Diagnosis not present

## 2023-08-22 DIAGNOSIS — Z9181 History of falling: Secondary | ICD-10-CM | POA: Diagnosis not present

## 2023-08-22 DIAGNOSIS — S72142D Displaced intertrochanteric fracture of left femur, subsequent encounter for closed fracture with routine healing: Secondary | ICD-10-CM | POA: Diagnosis not present

## 2023-08-22 DIAGNOSIS — S72002D Fracture of unspecified part of neck of left femur, subsequent encounter for closed fracture with routine healing: Secondary | ICD-10-CM | POA: Diagnosis not present

## 2023-08-22 DIAGNOSIS — Z741 Need for assistance with personal care: Secondary | ICD-10-CM | POA: Diagnosis not present

## 2023-08-23 DIAGNOSIS — I130 Hypertensive heart and chronic kidney disease with heart failure and stage 1 through stage 4 chronic kidney disease, or unspecified chronic kidney disease: Secondary | ICD-10-CM | POA: Diagnosis not present

## 2023-08-23 DIAGNOSIS — S72002D Fracture of unspecified part of neck of left femur, subsequent encounter for closed fracture with routine healing: Secondary | ICD-10-CM | POA: Diagnosis not present

## 2023-08-23 DIAGNOSIS — Z741 Need for assistance with personal care: Secondary | ICD-10-CM | POA: Diagnosis not present

## 2023-08-23 DIAGNOSIS — Z9181 History of falling: Secondary | ICD-10-CM | POA: Diagnosis not present

## 2023-08-23 DIAGNOSIS — S72142D Displaced intertrochanteric fracture of left femur, subsequent encounter for closed fracture with routine healing: Secondary | ICD-10-CM | POA: Diagnosis not present

## 2023-08-23 DIAGNOSIS — M6281 Muscle weakness (generalized): Secondary | ICD-10-CM | POA: Diagnosis not present

## 2023-08-24 DIAGNOSIS — M6281 Muscle weakness (generalized): Secondary | ICD-10-CM | POA: Diagnosis not present

## 2023-08-24 DIAGNOSIS — Z9181 History of falling: Secondary | ICD-10-CM | POA: Diagnosis not present

## 2023-08-24 DIAGNOSIS — Z741 Need for assistance with personal care: Secondary | ICD-10-CM | POA: Diagnosis not present

## 2023-08-24 DIAGNOSIS — S72002D Fracture of unspecified part of neck of left femur, subsequent encounter for closed fracture with routine healing: Secondary | ICD-10-CM | POA: Diagnosis not present

## 2023-08-24 DIAGNOSIS — I130 Hypertensive heart and chronic kidney disease with heart failure and stage 1 through stage 4 chronic kidney disease, or unspecified chronic kidney disease: Secondary | ICD-10-CM | POA: Diagnosis not present

## 2023-08-24 DIAGNOSIS — S72142D Displaced intertrochanteric fracture of left femur, subsequent encounter for closed fracture with routine healing: Secondary | ICD-10-CM | POA: Diagnosis not present

## 2023-08-27 DIAGNOSIS — E890 Postprocedural hypothyroidism: Secondary | ICD-10-CM | POA: Diagnosis not present

## 2023-08-27 LAB — TSH: TSH: 4.35 (ref 0.41–5.90)

## 2023-08-28 DIAGNOSIS — S72002D Fracture of unspecified part of neck of left femur, subsequent encounter for closed fracture with routine healing: Secondary | ICD-10-CM | POA: Diagnosis not present

## 2023-08-28 DIAGNOSIS — S72142D Displaced intertrochanteric fracture of left femur, subsequent encounter for closed fracture with routine healing: Secondary | ICD-10-CM | POA: Diagnosis not present

## 2023-08-28 DIAGNOSIS — Z741 Need for assistance with personal care: Secondary | ICD-10-CM | POA: Diagnosis not present

## 2023-08-28 DIAGNOSIS — Z9181 History of falling: Secondary | ICD-10-CM | POA: Diagnosis not present

## 2023-08-28 DIAGNOSIS — I130 Hypertensive heart and chronic kidney disease with heart failure and stage 1 through stage 4 chronic kidney disease, or unspecified chronic kidney disease: Secondary | ICD-10-CM | POA: Diagnosis not present

## 2023-08-28 DIAGNOSIS — M6281 Muscle weakness (generalized): Secondary | ICD-10-CM | POA: Diagnosis not present

## 2023-08-29 DIAGNOSIS — Z741 Need for assistance with personal care: Secondary | ICD-10-CM | POA: Diagnosis not present

## 2023-08-29 DIAGNOSIS — Z9181 History of falling: Secondary | ICD-10-CM | POA: Diagnosis not present

## 2023-08-29 DIAGNOSIS — M6281 Muscle weakness (generalized): Secondary | ICD-10-CM | POA: Diagnosis not present

## 2023-08-29 DIAGNOSIS — I130 Hypertensive heart and chronic kidney disease with heart failure and stage 1 through stage 4 chronic kidney disease, or unspecified chronic kidney disease: Secondary | ICD-10-CM | POA: Diagnosis not present

## 2023-08-29 DIAGNOSIS — S72002D Fracture of unspecified part of neck of left femur, subsequent encounter for closed fracture with routine healing: Secondary | ICD-10-CM | POA: Diagnosis not present

## 2023-08-29 DIAGNOSIS — S72142D Displaced intertrochanteric fracture of left femur, subsequent encounter for closed fracture with routine healing: Secondary | ICD-10-CM | POA: Diagnosis not present

## 2023-08-30 DIAGNOSIS — S72002D Fracture of unspecified part of neck of left femur, subsequent encounter for closed fracture with routine healing: Secondary | ICD-10-CM | POA: Diagnosis not present

## 2023-08-30 DIAGNOSIS — I130 Hypertensive heart and chronic kidney disease with heart failure and stage 1 through stage 4 chronic kidney disease, or unspecified chronic kidney disease: Secondary | ICD-10-CM | POA: Diagnosis not present

## 2023-08-30 DIAGNOSIS — M6281 Muscle weakness (generalized): Secondary | ICD-10-CM | POA: Diagnosis not present

## 2023-08-30 DIAGNOSIS — Z741 Need for assistance with personal care: Secondary | ICD-10-CM | POA: Diagnosis not present

## 2023-08-30 DIAGNOSIS — S72142D Displaced intertrochanteric fracture of left femur, subsequent encounter for closed fracture with routine healing: Secondary | ICD-10-CM | POA: Diagnosis not present

## 2023-08-30 DIAGNOSIS — Z9181 History of falling: Secondary | ICD-10-CM | POA: Diagnosis not present

## 2023-08-31 DIAGNOSIS — S72142D Displaced intertrochanteric fracture of left femur, subsequent encounter for closed fracture with routine healing: Secondary | ICD-10-CM | POA: Diagnosis not present

## 2023-08-31 DIAGNOSIS — Z9181 History of falling: Secondary | ICD-10-CM | POA: Diagnosis not present

## 2023-08-31 DIAGNOSIS — Z741 Need for assistance with personal care: Secondary | ICD-10-CM | POA: Diagnosis not present

## 2023-08-31 DIAGNOSIS — I130 Hypertensive heart and chronic kidney disease with heart failure and stage 1 through stage 4 chronic kidney disease, or unspecified chronic kidney disease: Secondary | ICD-10-CM | POA: Diagnosis not present

## 2023-08-31 DIAGNOSIS — M6281 Muscle weakness (generalized): Secondary | ICD-10-CM | POA: Diagnosis not present

## 2023-08-31 DIAGNOSIS — S72002D Fracture of unspecified part of neck of left femur, subsequent encounter for closed fracture with routine healing: Secondary | ICD-10-CM | POA: Diagnosis not present

## 2023-09-04 DIAGNOSIS — M6281 Muscle weakness (generalized): Secondary | ICD-10-CM | POA: Diagnosis not present

## 2023-09-04 DIAGNOSIS — Z741 Need for assistance with personal care: Secondary | ICD-10-CM | POA: Diagnosis not present

## 2023-09-04 DIAGNOSIS — S72142D Displaced intertrochanteric fracture of left femur, subsequent encounter for closed fracture with routine healing: Secondary | ICD-10-CM | POA: Diagnosis not present

## 2023-09-04 DIAGNOSIS — I130 Hypertensive heart and chronic kidney disease with heart failure and stage 1 through stage 4 chronic kidney disease, or unspecified chronic kidney disease: Secondary | ICD-10-CM | POA: Diagnosis not present

## 2023-09-04 DIAGNOSIS — Z9181 History of falling: Secondary | ICD-10-CM | POA: Diagnosis not present

## 2023-09-04 DIAGNOSIS — S72002D Fracture of unspecified part of neck of left femur, subsequent encounter for closed fracture with routine healing: Secondary | ICD-10-CM | POA: Diagnosis not present

## 2023-09-05 DIAGNOSIS — I130 Hypertensive heart and chronic kidney disease with heart failure and stage 1 through stage 4 chronic kidney disease, or unspecified chronic kidney disease: Secondary | ICD-10-CM | POA: Diagnosis not present

## 2023-09-05 DIAGNOSIS — M6281 Muscle weakness (generalized): Secondary | ICD-10-CM | POA: Diagnosis not present

## 2023-09-05 DIAGNOSIS — S72002D Fracture of unspecified part of neck of left femur, subsequent encounter for closed fracture with routine healing: Secondary | ICD-10-CM | POA: Diagnosis not present

## 2023-09-05 DIAGNOSIS — S72142D Displaced intertrochanteric fracture of left femur, subsequent encounter for closed fracture with routine healing: Secondary | ICD-10-CM | POA: Diagnosis not present

## 2023-09-05 DIAGNOSIS — Z9181 History of falling: Secondary | ICD-10-CM | POA: Diagnosis not present

## 2023-09-05 DIAGNOSIS — Z741 Need for assistance with personal care: Secondary | ICD-10-CM | POA: Diagnosis not present

## 2023-09-11 DIAGNOSIS — S72002D Fracture of unspecified part of neck of left femur, subsequent encounter for closed fracture with routine healing: Secondary | ICD-10-CM | POA: Diagnosis not present

## 2023-09-11 DIAGNOSIS — M6281 Muscle weakness (generalized): Secondary | ICD-10-CM | POA: Diagnosis not present

## 2023-09-11 DIAGNOSIS — Z9181 History of falling: Secondary | ICD-10-CM | POA: Diagnosis not present

## 2023-09-11 DIAGNOSIS — Z741 Need for assistance with personal care: Secondary | ICD-10-CM | POA: Diagnosis not present

## 2023-09-11 DIAGNOSIS — S72142D Displaced intertrochanteric fracture of left femur, subsequent encounter for closed fracture with routine healing: Secondary | ICD-10-CM | POA: Diagnosis not present

## 2023-09-11 DIAGNOSIS — I130 Hypertensive heart and chronic kidney disease with heart failure and stage 1 through stage 4 chronic kidney disease, or unspecified chronic kidney disease: Secondary | ICD-10-CM | POA: Diagnosis not present

## 2023-09-12 DIAGNOSIS — M6281 Muscle weakness (generalized): Secondary | ICD-10-CM | POA: Diagnosis not present

## 2023-09-12 DIAGNOSIS — S72142D Displaced intertrochanteric fracture of left femur, subsequent encounter for closed fracture with routine healing: Secondary | ICD-10-CM | POA: Diagnosis not present

## 2023-09-12 DIAGNOSIS — I130 Hypertensive heart and chronic kidney disease with heart failure and stage 1 through stage 4 chronic kidney disease, or unspecified chronic kidney disease: Secondary | ICD-10-CM | POA: Diagnosis not present

## 2023-09-12 DIAGNOSIS — S72002D Fracture of unspecified part of neck of left femur, subsequent encounter for closed fracture with routine healing: Secondary | ICD-10-CM | POA: Diagnosis not present

## 2023-09-12 DIAGNOSIS — Z741 Need for assistance with personal care: Secondary | ICD-10-CM | POA: Diagnosis not present

## 2023-09-12 DIAGNOSIS — Z9181 History of falling: Secondary | ICD-10-CM | POA: Diagnosis not present

## 2023-09-13 DIAGNOSIS — Z9181 History of falling: Secondary | ICD-10-CM | POA: Diagnosis not present

## 2023-09-13 DIAGNOSIS — M6281 Muscle weakness (generalized): Secondary | ICD-10-CM | POA: Diagnosis not present

## 2023-09-13 DIAGNOSIS — S72142D Displaced intertrochanteric fracture of left femur, subsequent encounter for closed fracture with routine healing: Secondary | ICD-10-CM | POA: Diagnosis not present

## 2023-09-13 DIAGNOSIS — I130 Hypertensive heart and chronic kidney disease with heart failure and stage 1 through stage 4 chronic kidney disease, or unspecified chronic kidney disease: Secondary | ICD-10-CM | POA: Diagnosis not present

## 2023-09-13 DIAGNOSIS — Z741 Need for assistance with personal care: Secondary | ICD-10-CM | POA: Diagnosis not present

## 2023-09-13 DIAGNOSIS — S72002D Fracture of unspecified part of neck of left femur, subsequent encounter for closed fracture with routine healing: Secondary | ICD-10-CM | POA: Diagnosis not present

## 2023-09-14 DIAGNOSIS — Z9181 History of falling: Secondary | ICD-10-CM | POA: Diagnosis not present

## 2023-09-14 DIAGNOSIS — M6281 Muscle weakness (generalized): Secondary | ICD-10-CM | POA: Diagnosis not present

## 2023-09-14 DIAGNOSIS — S72002D Fracture of unspecified part of neck of left femur, subsequent encounter for closed fracture with routine healing: Secondary | ICD-10-CM | POA: Diagnosis not present

## 2023-09-14 DIAGNOSIS — I130 Hypertensive heart and chronic kidney disease with heart failure and stage 1 through stage 4 chronic kidney disease, or unspecified chronic kidney disease: Secondary | ICD-10-CM | POA: Diagnosis not present

## 2023-09-14 DIAGNOSIS — Z741 Need for assistance with personal care: Secondary | ICD-10-CM | POA: Diagnosis not present

## 2023-09-14 DIAGNOSIS — S72142D Displaced intertrochanteric fracture of left femur, subsequent encounter for closed fracture with routine healing: Secondary | ICD-10-CM | POA: Diagnosis not present

## 2023-09-17 ENCOUNTER — Non-Acute Institutional Stay (SKILLED_NURSING_FACILITY): Payer: Medicare Other | Admitting: Student

## 2023-09-17 DIAGNOSIS — E440 Moderate protein-calorie malnutrition: Secondary | ICD-10-CM

## 2023-09-17 DIAGNOSIS — F1320 Sedative, hypnotic or anxiolytic dependence, uncomplicated: Secondary | ICD-10-CM | POA: Diagnosis not present

## 2023-09-17 DIAGNOSIS — I10 Essential (primary) hypertension: Secondary | ICD-10-CM

## 2023-09-17 DIAGNOSIS — I4821 Permanent atrial fibrillation: Secondary | ICD-10-CM

## 2023-09-17 DIAGNOSIS — I5032 Chronic diastolic (congestive) heart failure: Secondary | ICD-10-CM

## 2023-09-17 DIAGNOSIS — K5904 Chronic idiopathic constipation: Secondary | ICD-10-CM

## 2023-09-18 DIAGNOSIS — S72142D Displaced intertrochanteric fracture of left femur, subsequent encounter for closed fracture with routine healing: Secondary | ICD-10-CM | POA: Diagnosis not present

## 2023-09-18 DIAGNOSIS — M6281 Muscle weakness (generalized): Secondary | ICD-10-CM | POA: Diagnosis not present

## 2023-09-18 DIAGNOSIS — E1122 Type 2 diabetes mellitus with diabetic chronic kidney disease: Secondary | ICD-10-CM | POA: Diagnosis not present

## 2023-09-18 DIAGNOSIS — H903 Sensorineural hearing loss, bilateral: Secondary | ICD-10-CM | POA: Diagnosis not present

## 2023-09-18 DIAGNOSIS — S72002D Fracture of unspecified part of neck of left femur, subsequent encounter for closed fracture with routine healing: Secondary | ICD-10-CM | POA: Diagnosis not present

## 2023-09-18 DIAGNOSIS — H9311 Tinnitus, right ear: Secondary | ICD-10-CM | POA: Diagnosis not present

## 2023-09-18 DIAGNOSIS — Z741 Need for assistance with personal care: Secondary | ICD-10-CM | POA: Diagnosis not present

## 2023-09-18 DIAGNOSIS — I5032 Chronic diastolic (congestive) heart failure: Secondary | ICD-10-CM | POA: Diagnosis not present

## 2023-09-18 DIAGNOSIS — R2689 Other abnormalities of gait and mobility: Secondary | ICD-10-CM | POA: Diagnosis not present

## 2023-09-18 DIAGNOSIS — Z9181 History of falling: Secondary | ICD-10-CM | POA: Diagnosis not present

## 2023-09-18 DIAGNOSIS — I130 Hypertensive heart and chronic kidney disease with heart failure and stage 1 through stage 4 chronic kidney disease, or unspecified chronic kidney disease: Secondary | ICD-10-CM | POA: Diagnosis not present

## 2023-09-18 NOTE — Progress Notes (Unsigned)
Location:  Other Nursing Home Room Number: [facility] [address]A Place of Service:  SNF [phone]) Provider:  Ander Gaster, Benetta Spar, MD  Patient Care Team: [name], MD as PCP - General (Family Medicine) [name], MD as PCP - Cardiology (Cardiology) [name], MD as PCP - Electrophysiology (Cardiology)  Extended Emergency Contact Information Primary Emergency Contact: Sofie Rower [place] Home Phone: [phone] Relation: Daughter Secondary Emergency Contact: Cissell,Danny Mobile Phone: [phone] Relation: Son  Code Status:  DNR Goals of care: Advanced Directive information    08/16/2023    3:03 PM  Advanced Directives  Does Patient Have a Medical Advance Directive? Yes  Type of Advance Directive Out of facility DNR (pink MOST or yellow form)  Does patient want to make changes to medical advance directive? No - Patient declined     Chief Complaint  Patient presents with   Medical Management of Chronic Conditions.     HPI:  Pt is a 89 y.o. female seen today for medical management of chronic diseases.    Patient states she is doing well in general and denies complaints at this time. Nursing stating patient has dry mouth. Discussed the importance of hydration. Encourage drinking 36-40oz of fluids per day.   She is eating well. Having good bowel movements.   4lb weight loss in the last month. Recent COVID infection, now improving.  Denies dysuria or constipation at this time.   Past Medical History:  Diagnosis Date   Back pain    Bruises easily    Coronary artery disease    NONOBSTRUCTIVE   Fatigue    Hyperlipidemia    Hypertension    Knee pain    OA (osteoarthritis)    Obesity    Palpitations    SOB (shortness of breath)    Past Surgical History:  Procedure Laterality Date   BREAST BIOPSY     LEFT BREAST   CARDIAC CATHETERIZATION  05/11/2006   OVERALL CARDIAC SIZE AND SILHOUETTE ARE NORMAL. EF ESTIMATED 60%    INTRAMEDULLARY (IM) NAIL INTERTROCHANTERIC Left 03/12/2023   Procedure: INTRAMEDULLARY (IM) NAIL INTERTROCHANTERIC;  Surgeon: Roby Lofts, MD;  Location: [facility] OR;  Service: Orthopedics;  Laterality: Left;   KIDNEY STONES  1982   KNEE ARTHROSCOPY  09/22/2008   VAGINAL HYSTERECTOMY      Allergies  Allergen Reactions   Codeine Other (See Comments)    Unknown reaction   Demerol [Meperidine Hcl] Other (See Comments)    Unknown reaction   Morphine And Codeine Other (See Comments)    Unknown reaction    Outpatient Encounter Medications as of 09/17/2023  Medication Sig   acetaminophen (TYLENOL) 325 MG tablet Take 650 mg by mouth every 6 (six) hours as needed.   acetaminophen (TYLENOL) 325 MG tablet Take 650 mg by mouth every 4 (four) hours as needed.   ALPRAZolam (XANAX) 0.5 MG tablet Take 1 tablet (0.5 mg total) by mouth at bedtime.   alum & mag hydroxide-simeth (MAALOX PLUS) 400-400-40 MG/5ML suspension Take 5 mLs by mouth every 4 (four) hours as needed for indigestion.   apixaban (ELIQUIS) 5 MG TABS tablet Take 1 tablet (5 mg total) by mouth 2 (two) times daily.   bisacodyl (DULCOLAX) 10 MG suppository Place 10 mg rectally daily. As needed for constipation   ergocalciferol (VITAMIN D2) 50000 UNITS capsule Take 50,000 Units by mouth once a week.   estradiol (ESTRACE) 0.1 MG/GM vaginal cream Place 1 Applicatorful vaginally. At bedtime every Tuesday and  Friday   famotidine (PEPCID) 10 MG tablet Take 10 mg by mouth at bedtime.   furosemide (LASIX) 20 MG tablet TAKE 1 TABLET EVERY DAY   Infant Care Products (DERMACLOUD EX) Apply to buttocks topically every shift.   metoprolol tartrate (LOPRESSOR) 100 MG tablet Take 100 mg by mouth 2 (two) times daily.   metoprolol tartrate (LOPRESSOR) 50 MG tablet Take 50 mg by mouth as directed. Every 24 hours as needed for heart palpitations   MOLNUPIRAVIR PO Take 800 mg by mouth 2 (two) times daily.   nitroGLYCERIN (NITROSTAT) 0.4 MG SL tablet Place 1 tablet  (0.4 mg total) under the tongue every 5 (five) minutes as needed for chest pain.   ondansetron (ZOFRAN) 4 MG tablet Take 4 mg by mouth every 8 (eight) hours as needed for nausea or vomiting.   polyethylene glycol (MIRALAX / GLYCOLAX) 17 g packet Take 17 g by mouth daily. And every 24 hours as needed for constipation   traMADol (ULTRAM) 50 MG tablet Take 50 mg by mouth every 6 (six) hours as needed. Give 2 tablets by mouth every 6 hours as needed for severe pain.   Zinc Oxide (TRIPLE PASTE) 12.8 % ointment Apply 1 Application topically. To peri area and skin folds topically twice daily   No facility-administered encounter medications on file as of 09/17/2023.    Review of Systems  Immunization History  Administered Date(s) Administered   Influenza, High Dose Seasonal PF 11/23/2022   PFIZER(Purple Top)SARS-COV-2 Vaccination 01/07/2020, 01/27/2020, 11/18/2020   Pertinent  Health Maintenance Due  Topic Date Due   DEXA SCAN  Never done   INFLUENZA VACCINE  07/19/2023       No data to display         Functional Status Survey:    Vitals:   09/17/23 1418  BP: (!) 144/65  Pulse: (!) 54  Resp: 17  Temp: (!) 97.3 F (36.3 C)  SpO2: 94%  Weight: 172 lb 12.8 oz (78.4 kg)   Body mass index is 27.89 kg/m. Physical Exam Constitutional:      Appearance: Normal appearance.  Cardiovascular:     Rate and Rhythm: Normal rate.     Pulses: Normal pulses.  Pulmonary:     Effort: Pulmonary effort is normal.  Musculoskeletal:        General: No swelling.  Skin:    General: Skin is warm and dry.  Neurological:     Mental Status: She is alert.     Labs reviewed: Recent Labs    03/12/23 0425 03/13/23 0156 03/14/23 0217 03/29/23 0000 04/18/23 0000 04/30/23 0000  NA 136 134* 133* 135* 133* 138  K 3.4* 3.9 4.2 4.1 3.5 3.6  CL 103 96* 95* 100 98* 97*  CO2 25 24 27  29* 30* 34*  GLUCOSE 187* 161* 169*  --   --   --   BUN 14 20 27* 17 14 23*  CREATININE 0.93 1.25* 1.03* 0.7 0.7  0.8  CALCIUM 8.8* 8.7* 8.5* 8.3* 8.9 8.4*   Recent Labs    03/13/23 0156 03/14/23 0217 03/29/23 0000  AST 29 20 15   ALT 13 10 6*  ALKPHOS 40 40 155*  BILITOT 0.4 0.8  --   PROT 5.8* 5.7*  --   ALBUMIN 2.8* 2.7* 2.7*   Recent Labs    03/13/23 0156 03/14/23 0217 03/15/23 0242 03/15/23 0926 03/29/23 0000 04/18/23 0000 04/30/23 0000 07/05/23 0000  WBC 14.5* 15.7* 13.9*   < > 7.1 9.2 8.3 7.5  NEUTROABS  --   --   --   --  4,672.00 6,201.00 4,930.00  --   HGB 10.3* 9.1* 8.8*   < > 10.6* 12.2 12.8 12.4  HCT 31.7* 27.9* 26.0*   < > 33* 38 40 38  MCV 94.1 94.3 93.2  --   --   --   --   --   PLT 232 218 250  --  406* 342 413* 270   < > = values in this interval not displayed.   Lab Results  Component Value Date   TSH 4.75 06/28/2023   Lab Results  Component Value Date   HGBA1C 6.4 06/28/2023   Lab Results  Component Value Date   CHOL 187 03/04/2018   HDL 44 03/04/2018   LDLCALC 90 03/04/2018   TRIG 267 (H) 03/04/2018   CHOLHDL 4.3 03/04/2018    Significant Diagnostic Results in last 30 days:  No results found.  Assessment/Plan Moderate protein-calorie malnutrition (HCC)  Chronic heart failure with preserved ejection fraction (HFpEF) (HCC)  Permanent atrial fibrillation (HCC)  Benzodiazepine dependence (HCC)  Hypertension, unspecified type  Chronic idiopathic constipation Patient is doing well in general. Some wieght loss in the setting of recent COVID 19 infection. Appears euvolemic on exam. Rate controlled with metoprolol. Denies symptoms of dizziness with standing, no changes to current dosing. Continue apixiban, nos signs of bleeding at this time. Patient has trialed and failed numerous attmepts at discontinuation of benzodiazepines over the years despite slow wean. Given patient's intolerance, will continue current dosing Family/ staff Communication:nursing  Labs/tests ordered:  none

## 2023-09-19 ENCOUNTER — Encounter: Payer: Self-pay | Admitting: Student

## 2023-09-19 DIAGNOSIS — Z9181 History of falling: Secondary | ICD-10-CM | POA: Diagnosis not present

## 2023-09-19 DIAGNOSIS — I130 Hypertensive heart and chronic kidney disease with heart failure and stage 1 through stage 4 chronic kidney disease, or unspecified chronic kidney disease: Secondary | ICD-10-CM | POA: Diagnosis not present

## 2023-09-19 DIAGNOSIS — S72142D Displaced intertrochanteric fracture of left femur, subsequent encounter for closed fracture with routine healing: Secondary | ICD-10-CM | POA: Diagnosis not present

## 2023-09-19 DIAGNOSIS — Z741 Need for assistance with personal care: Secondary | ICD-10-CM | POA: Diagnosis not present

## 2023-09-19 DIAGNOSIS — S72002D Fracture of unspecified part of neck of left femur, subsequent encounter for closed fracture with routine healing: Secondary | ICD-10-CM | POA: Diagnosis not present

## 2023-09-19 DIAGNOSIS — M6281 Muscle weakness (generalized): Secondary | ICD-10-CM | POA: Diagnosis not present

## 2023-09-20 DIAGNOSIS — Z9181 History of falling: Secondary | ICD-10-CM | POA: Diagnosis not present

## 2023-09-20 DIAGNOSIS — M6281 Muscle weakness (generalized): Secondary | ICD-10-CM | POA: Diagnosis not present

## 2023-09-20 DIAGNOSIS — Z5181 Encounter for therapeutic drug level monitoring: Secondary | ICD-10-CM | POA: Diagnosis not present

## 2023-09-20 DIAGNOSIS — S72142D Displaced intertrochanteric fracture of left femur, subsequent encounter for closed fracture with routine healing: Secondary | ICD-10-CM | POA: Diagnosis not present

## 2023-09-20 DIAGNOSIS — E559 Vitamin D deficiency, unspecified: Secondary | ICD-10-CM | POA: Diagnosis not present

## 2023-09-20 DIAGNOSIS — S72002D Fracture of unspecified part of neck of left femur, subsequent encounter for closed fracture with routine healing: Secondary | ICD-10-CM | POA: Diagnosis not present

## 2023-09-20 DIAGNOSIS — I130 Hypertensive heart and chronic kidney disease with heart failure and stage 1 through stage 4 chronic kidney disease, or unspecified chronic kidney disease: Secondary | ICD-10-CM | POA: Diagnosis not present

## 2023-09-20 DIAGNOSIS — Z741 Need for assistance with personal care: Secondary | ICD-10-CM | POA: Diagnosis not present

## 2023-09-21 DIAGNOSIS — Z9181 History of falling: Secondary | ICD-10-CM | POA: Diagnosis not present

## 2023-09-21 DIAGNOSIS — S72142D Displaced intertrochanteric fracture of left femur, subsequent encounter for closed fracture with routine healing: Secondary | ICD-10-CM | POA: Diagnosis not present

## 2023-09-21 DIAGNOSIS — S72002D Fracture of unspecified part of neck of left femur, subsequent encounter for closed fracture with routine healing: Secondary | ICD-10-CM | POA: Diagnosis not present

## 2023-09-21 DIAGNOSIS — M6281 Muscle weakness (generalized): Secondary | ICD-10-CM | POA: Diagnosis not present

## 2023-09-21 DIAGNOSIS — Z741 Need for assistance with personal care: Secondary | ICD-10-CM | POA: Diagnosis not present

## 2023-09-21 DIAGNOSIS — I130 Hypertensive heart and chronic kidney disease with heart failure and stage 1 through stage 4 chronic kidney disease, or unspecified chronic kidney disease: Secondary | ICD-10-CM | POA: Diagnosis not present

## 2023-09-24 DIAGNOSIS — S50811A Abrasion of right forearm, initial encounter: Secondary | ICD-10-CM | POA: Diagnosis not present

## 2023-09-24 LAB — VITAMIN D 25 HYDROXY (VIT D DEFICIENCY, FRACTURES): Vit D, 25-Hydroxy: 36

## 2023-09-25 DIAGNOSIS — I130 Hypertensive heart and chronic kidney disease with heart failure and stage 1 through stage 4 chronic kidney disease, or unspecified chronic kidney disease: Secondary | ICD-10-CM | POA: Diagnosis not present

## 2023-09-25 DIAGNOSIS — S72142D Displaced intertrochanteric fracture of left femur, subsequent encounter for closed fracture with routine healing: Secondary | ICD-10-CM | POA: Diagnosis not present

## 2023-09-25 DIAGNOSIS — Z741 Need for assistance with personal care: Secondary | ICD-10-CM | POA: Diagnosis not present

## 2023-09-25 DIAGNOSIS — M6281 Muscle weakness (generalized): Secondary | ICD-10-CM | POA: Diagnosis not present

## 2023-09-25 DIAGNOSIS — Z9181 History of falling: Secondary | ICD-10-CM | POA: Diagnosis not present

## 2023-09-25 DIAGNOSIS — S72002D Fracture of unspecified part of neck of left femur, subsequent encounter for closed fracture with routine healing: Secondary | ICD-10-CM | POA: Diagnosis not present

## 2023-09-27 DIAGNOSIS — Z741 Need for assistance with personal care: Secondary | ICD-10-CM | POA: Diagnosis not present

## 2023-09-27 DIAGNOSIS — M6281 Muscle weakness (generalized): Secondary | ICD-10-CM | POA: Diagnosis not present

## 2023-09-27 DIAGNOSIS — Z9181 History of falling: Secondary | ICD-10-CM | POA: Diagnosis not present

## 2023-09-27 DIAGNOSIS — S72142D Displaced intertrochanteric fracture of left femur, subsequent encounter for closed fracture with routine healing: Secondary | ICD-10-CM | POA: Diagnosis not present

## 2023-09-27 DIAGNOSIS — I130 Hypertensive heart and chronic kidney disease with heart failure and stage 1 through stage 4 chronic kidney disease, or unspecified chronic kidney disease: Secondary | ICD-10-CM | POA: Diagnosis not present

## 2023-09-27 DIAGNOSIS — S72002D Fracture of unspecified part of neck of left femur, subsequent encounter for closed fracture with routine healing: Secondary | ICD-10-CM | POA: Diagnosis not present

## 2023-09-28 ENCOUNTER — Encounter: Payer: Self-pay | Admitting: Adult Health

## 2023-09-28 ENCOUNTER — Non-Acute Institutional Stay (SKILLED_NURSING_FACILITY): Payer: Self-pay | Admitting: Adult Health

## 2023-09-28 DIAGNOSIS — I5032 Chronic diastolic (congestive) heart failure: Secondary | ICD-10-CM | POA: Diagnosis not present

## 2023-09-28 DIAGNOSIS — L03114 Cellulitis of left upper limb: Secondary | ICD-10-CM

## 2023-09-28 NOTE — Progress Notes (Signed)
Location:  [facility] Room Number: [facility]) Place of Service:  SNF (31) Provider:  Kenard Gower, DNP, FNP-BC  Patient Care Team: [name], MD as PCP - General (Family Medicine) [name], MD as PCP - Cardiology (Cardiology) [name], MD as PCP - Electrophysiology (Cardiology)  Extended Emergency Contact Information Primary Emergency Contact: Sofie Rower [place] of [place] Home Phone: [phone] Relation: Daughter Secondary Emergency Contact: Vencill,Danny Mobile Phone: [phone] Relation: Son  Code Status:  DNR  Goals of care: Advanced Directive information    09/28/2023    3:57 PM  Advanced Directives  Does Patient Have a Medical Advance Directive? No  Does patient want to make changes to medical advance directive? No - Patient declined  Would patient like information on creating a medical advance directive? No - Patient declined     Chief Complaint  Patient presents with   Acute Visit     non healing wound on left forearm     HPI:  Pt is a 89 y.o. female seen today for an acute visit for no healing wound on left forearm. She is a resident of [facility]. Wound noted to have clear watery discharge with slight erythema. She has BLE 3+edema. No SOB. She takes Furosemide for CHF.   Past Medical History:  Diagnosis Date   Back pain    Bruises easily    Coronary artery disease    NONOBSTRUCTIVE   Fatigue    Hyperlipidemia    Hypertension    Knee pain    OA (osteoarthritis)    Obesity    Palpitations    SOB (shortness of breath)    Past Surgical History:  Procedure Laterality Date   BREAST BIOPSY     LEFT BREAST   CARDIAC CATHETERIZATION  05/11/2006   OVERALL CARDIAC SIZE AND SILHOUETTE ARE NORMAL. EF ESTIMATED 60%   INTRAMEDULLARY (IM) NAIL INTERTROCHANTERIC Left 03/12/2023   Procedure: INTRAMEDULLARY (IM) NAIL INTERTROCHANTERIC;  Surgeon: Roby Lofts, MD;  Location: [facility] OR;  Service: Orthopedics;  Laterality: Left;   KIDNEY STONES  1982   KNEE ARTHROSCOPY  09/22/2008   VAGINAL HYSTERECTOMY      Allergies  Allergen Reactions   Codeine Other (See Comments)    Unknown reaction   Demerol [Meperidine Hcl] Other (See Comments)    Unknown reaction   Morphine And Codeine Other (See Comments)    Unknown reaction    Outpatient Encounter Medications as of 09/28/2023  Medication Sig   acetaminophen (TYLENOL) 325 MG tablet Take 650 mg by mouth every 6 (six) hours as needed.   acetaminophen (TYLENOL) 325 MG tablet Take 650 mg by mouth every 4 (four) hours as needed.   ALPRAZolam (XANAX) 0.5 MG tablet Take 1 tablet (0.5 mg total) by mouth at bedtime.   alum & mag hydroxide-simeth (MAALOX PLUS) 400-400-40 MG/5ML suspension Take 5 mLs by mouth every 4 (four) hours as needed for indigestion.   apixaban (ELIQUIS) 5 MG TABS tablet Take 1 tablet (5 mg total) by mouth 2 (two) times daily.   bacitracin-polymyxin b (POLYSPORIN) ointment Apply topically daily.   bisacodyl (DULCOLAX) 10 MG suppository Place 10 mg rectally daily. As needed for constipation   ergocalciferol (VITAMIN D2) 50000 UNITS capsule Take 50,000 Units by mouth once a week.   estradiol (ESTRACE) 0.1 MG/GM vaginal cream Place 1 Applicatorful vaginally. At bedtime every Tuesday and Friday   famotidine (PEPCID) 10 MG tablet Take 10 mg  by mouth at bedtime.   furosemide (LASIX) 20 MG tablet TAKE 1 TABLET EVERY DAY   hydroxypropyl methylcellulose / hypromellose (ISOPTO TEARS / GONIOVISC) 2.5 % ophthalmic solution Place 1 drop into both eyes every 4 (four) hours as needed for dry eyes.   metoprolol tartrate (LOPRESSOR) 100 MG tablet Take 100 mg by mouth 2 (two) times daily.   metoprolol tartrate (LOPRESSOR) 50 MG tablet Take 50 mg by mouth as directed. Every 24 hours as needed for heart palpitations   nitroGLYCERIN (NITROSTAT) 0.4 MG SL tablet Place 1 tablet (0.4 mg total) under the tongue  every 5 (five) minutes as needed for chest pain.   ondansetron (ZOFRAN) 4 MG tablet Take 4 mg by mouth every 8 (eight) hours as needed for nausea or vomiting.   polyethylene glycol (MIRALAX / GLYCOLAX) 17 g packet Take 17 g by mouth daily. And every 24 hours as needed for constipation   traMADol (ULTRAM) 50 MG tablet Take 50 mg by mouth every 6 (six) hours as needed. Give 2 tablets by mouth every 6 hours as needed for severe pain.   Zinc Oxide (TRIPLE PASTE) 12.8 % ointment Apply 1 Application topically. To peri area and skin folds topically twice daily   [DISCONTINUED] Infant Care Products (DERMACLOUD EX) Apply to buttocks topically every shift.   [DISCONTINUED] MOLNUPIRAVIR PO Take 800 mg by mouth 2 (two) times daily.   [DISCONTINUED] phenazopyridine (PYRIDIUM) 100 MG tablet Take 100 mg by mouth daily.   No facility-administered encounter medications on file as of 09/28/2023.    Review of Systems  Constitutional:  Negative for appetite change, chills, fatigue and fever.  HENT:  Negative for congestion, hearing loss, rhinorrhea and sore throat.   Eyes: Negative.   Respiratory:  Negative for cough, shortness of breath and wheezing.   Cardiovascular:  Negative for chest pain, palpitations and leg swelling.  Gastrointestinal:  Negative for abdominal pain, constipation, diarrhea, nausea and vomiting.  Genitourinary:  Negative for dysuria.  Musculoskeletal:  Negative for arthralgias, back pain and myalgias.  Skin:  Positive for wound. Negative for color change and rash.  Neurological:  Negative for dizziness, weakness and headaches.  Psychiatric/Behavioral:  Negative for behavioral problems. The patient is not nervous/anxious.      Immunization History  Administered Date(s) Administered   Influenza, High Dose Seasonal PF 11/23/2022   PFIZER(Purple Top)SARS-COV-2 Vaccination 01/07/2020, 01/27/2020, 11/18/2020   Pertinent  Health Maintenance Due  Topic Date Due   DEXA SCAN  Never done    INFLUENZA VACCINE  07/19/2023       No data to display           Vitals:   09/28/23 1537  BP: (!) 143/66  Pulse: 62  Resp: 17  Temp: (!) 97.3 F (36.3 C)  SpO2: 94%  Weight: 178 lb 12.8 oz (81.1 kg)  Height: 5\' 6"  (1.676 m)   Body mass index is 28.86 kg/m.  Physical Exam Constitutional:      General: She is not in acute distress.    Appearance: Normal appearance.  HENT:     Head: Normocephalic and atraumatic.     Nose: Nose normal.     Mouth/Throat:     Mouth: Mucous membranes are moist.  Eyes:     Conjunctiva/sclera: Conjunctivae normal.  Cardiovascular:     Rate and Rhythm: Normal rate. Rhythm irregular.  Pulmonary:     Effort: Pulmonary effort is normal.     Breath sounds: Normal breath sounds.  Abdominal:  General: Bowel sounds are normal.     Palpations: Abdomen is soft.  Musculoskeletal:        General: Normal range of motion.     Cervical back: Normal range of motion.     Right lower leg: Edema present.     Left lower leg: Edema present.     Comments: BLE 3+edema, covered with ace wrap  Skin:    General: Skin is warm and dry.     Comments: Left forearm wound with clear watery discharge with slight erythema  Neurological:     Mental Status: She is alert. Mental status is at baseline.  Psychiatric:        Mood and Affect: Mood normal.        Behavior: Behavior normal.    Labs reviewed: Recent Labs    03/12/23 0425 03/13/23 0156 03/14/23 0217 03/29/23 0000 04/18/23 0000 04/30/23 0000  NA 136 134* 133* 135* 133* 138  K 3.4* 3.9 4.2 4.1 3.5 3.6  CL 103 96* 95* 100 98* 97*  CO2 25 24 27  29* 30* 34*  GLUCOSE 187* 161* 169*  --   --   --   BUN 14 20 27* 17 14 23*  CREATININE 0.93 1.25* 1.03* 0.7 0.7 0.8  CALCIUM 8.8* 8.7* 8.5* 8.3* 8.9 8.4*   Recent Labs    03/13/23 0156 03/14/23 0217 03/29/23 0000  AST 29 20 15   ALT 13 10 6*  ALKPHOS 40 40 155*  BILITOT 0.4 0.8  --   PROT 5.8* 5.7*  --   ALBUMIN 2.8* 2.7* 2.7*   Recent Labs     03/13/23 0156 03/14/23 0217 03/15/23 0242 03/15/23 0926 03/29/23 0000 04/18/23 0000 04/30/23 0000 07/05/23 0000  WBC 14.5* 15.7* 13.9*   < > 7.1 9.2 8.3 7.5  NEUTROABS  --   --   --   --  4,672.00 6,201.00 4,930.00  --   HGB 10.3* 9.1* 8.8*   < > 10.6* 12.2 12.8 12.4  HCT 31.7* 27.9* 26.0*   < > 33* 38 40 38  MCV 94.1 94.3 93.2  --   --   --   --   --   PLT 232 218 250  --  406* 342 413* 270   < > = values in this interval not displayed.   Lab Results  Component Value Date   TSH 4.35 08/27/2023   Lab Results  Component Value Date   HGBA1C 6.4 06/28/2023   Lab Results  Component Value Date   CHOL 187 03/04/2018   HDL 44 03/04/2018   LDLCALC 90 03/04/2018   TRIG 267 (H) 03/04/2018   CHOLHDL 4.3 03/04/2018    Significant Diagnostic Results in last 30 days:  No results found.  Assessment/Plan  1. Cellulitis of left forearm -  will start Bactroban 2% ointment apply to left forearm wound, cover with dry dressing, and secure with netting daily till healed . Do not use adhesive.   2. Chronic heart failure with preserved ejection fraction (HFpEF) (HCC) -  no SOB -   stable -  continue Furosemide and cover on BLE with ace wrap     Family/ staff Communication: Discussed plan of care with resident and charge nurse.  Labs/tests ordered: None    Kenard Gower, DNP, MSN, FNP-BC [facility] [phone] (Monday-Friday 8:00 a.m. - 5:00 p.m.) [phone] (after hours)

## 2023-10-02 DIAGNOSIS — Z741 Need for assistance with personal care: Secondary | ICD-10-CM | POA: Diagnosis not present

## 2023-10-02 DIAGNOSIS — S72142D Displaced intertrochanteric fracture of left femur, subsequent encounter for closed fracture with routine healing: Secondary | ICD-10-CM | POA: Diagnosis not present

## 2023-10-02 DIAGNOSIS — Z9181 History of falling: Secondary | ICD-10-CM | POA: Diagnosis not present

## 2023-10-02 DIAGNOSIS — S72002D Fracture of unspecified part of neck of left femur, subsequent encounter for closed fracture with routine healing: Secondary | ICD-10-CM | POA: Diagnosis not present

## 2023-10-02 DIAGNOSIS — I130 Hypertensive heart and chronic kidney disease with heart failure and stage 1 through stage 4 chronic kidney disease, or unspecified chronic kidney disease: Secondary | ICD-10-CM | POA: Diagnosis not present

## 2023-10-02 DIAGNOSIS — M6281 Muscle weakness (generalized): Secondary | ICD-10-CM | POA: Diagnosis not present

## 2023-10-03 ENCOUNTER — Non-Acute Institutional Stay (SKILLED_NURSING_FACILITY): Payer: Medicare Other | Admitting: Student

## 2023-10-03 ENCOUNTER — Encounter: Payer: Self-pay | Admitting: Student

## 2023-10-03 DIAGNOSIS — S41102D Unspecified open wound of left upper arm, subsequent encounter: Secondary | ICD-10-CM | POA: Diagnosis not present

## 2023-10-03 NOTE — Progress Notes (Unsigned)
Location:  Other [place].  Nursing Home Room Number: [facility] [address] Place of Service:  SNF [phone]) Provider:  [name], MD  Patient Care Team: [name], MD as PCP - General (Family Medicine) [name], MD (Inactive) as PCP - Cardiology (Cardiology) [name], MD as PCP - Electrophysiology (Cardiology)  Extended Emergency Contact Information Primary Emergency Contact: Sofie Rower [place] of [place] Home Phone: [phone] Relation: Daughter Secondary Emergency Contact: Stamey,Danny Mobile Phone: [phone] Relation: Son  Code Status:  DNR Goals of care: Advanced Directive information    10/03/2023   11:25 AM  Advanced Directives  Does Patient Have a Medical Advance Directive? Yes  Type of Advance Directive Out of facility DNR (pink MOST or yellow form)  Does patient want to make changes to medical advance directive? No - Patient declined     Chief Complaint  Patient presents with   Acute Visit    Wound Care.     HPI:  Pt is a 89 y.o. female seen today for an acute visit for Wound Care.   Patient with a left arm wound.  Nursing with concern for contact dermatitis versus infection due to increased redness from yesterday.  Patient stated yesterday she had burning in the area with light touch.  Of note patient's topical treatment was changed from bacitracin to Bactroban in the last 48 hours   Past Medical History:  Diagnosis Date   Back pain    Bruises easily    Coronary artery disease    NONOBSTRUCTIVE   Fatigue    Hyperlipidemia    Hypertension    Knee pain    OA (osteoarthritis)    Obesity    Palpitations    SOB (shortness of breath)    Past Surgical History:  Procedure Laterality Date   BREAST BIOPSY     LEFT BREAST   CARDIAC CATHETERIZATION  05/11/2006   OVERALL CARDIAC SIZE AND SILHOUETTE ARE NORMAL. EF ESTIMATED 60%   INTRAMEDULLARY (IM) NAIL INTERTROCHANTERIC Left 03/12/2023   Procedure:  INTRAMEDULLARY (IM) NAIL INTERTROCHANTERIC;  Surgeon: Roby Lofts, MD;  Location: [facility] OR;  Service: Orthopedics;  Laterality: Left;   KIDNEY STONES  1982   KNEE ARTHROSCOPY  09/22/2008   VAGINAL HYSTERECTOMY      Allergies  Allergen Reactions   Codeine Other (See Comments)    Unknown reaction   Demerol [Meperidine Hcl] Other (See Comments)    Unknown reaction   Morphine And Codeine Other (See Comments)    Unknown reaction    Outpatient Encounter Medications as of 10/03/2023  Medication Sig   acetaminophen (TYLENOL) 325 MG tablet Take 650 mg by mouth every 6 (six) hours as needed.   acetaminophen (TYLENOL) 325 MG tablet Take 650 mg by mouth every 4 (four) hours as needed.   alum & mag hydroxide-simeth (MAALOX PLUS) 400-400-40 MG/5ML suspension Take 5 mLs by mouth every 4 (four) hours as needed for indigestion.   apixaban (ELIQUIS) 5 MG TABS tablet Take 1 tablet (5 mg total) by mouth 2 (two) times daily.   bisacodyl (DULCOLAX) 10 MG suppository Place 10 mg rectally daily. As needed for constipation   Cholecalciferol (VITAMIN D-3) 25 MCG (1000 UT) CAPS One by mouth daily.   estradiol (ESTRACE) 0.1 MG/GM vaginal cream Place 1 Applicatorful vaginally. At bedtime every Tuesday and Friday   famotidine (PEPCID) 10 MG tablet Take 10 mg by mouth at bedtime.   furosemide (LASIX) 20 MG tablet TAKE 1 TABLET EVERY DAY  hydroxypropyl methylcellulose / hypromellose (ISOPTO TEARS / GONIOVISC) 2.5 % ophthalmic solution Place 1 drop into both eyes every 4 (four) hours as needed for dry eyes.   metoprolol tartrate (LOPRESSOR) 100 MG tablet Take 100 mg by mouth 2 (two) times daily.   metoprolol tartrate (LOPRESSOR) 50 MG tablet Take 50 mg by mouth as directed. Every 24 hours as needed for heart palpitations   mupirocin ointment (BACTROBAN) 2 % Apply to Left forearm topically every day shift   nitroGLYCERIN (NITROSTAT) 0.4 MG SL tablet Place 1 tablet (0.4 mg total) under the tongue every 5 (five) minutes  as needed for chest pain.   ondansetron (ZOFRAN) 4 MG tablet Take 4 mg by mouth every 8 (eight) hours as needed for nausea or vomiting.   polyethylene glycol (MIRALAX / GLYCOLAX) 17 g packet Take 17 g by mouth daily. And every 24 hours as needed for constipation   traMADol (ULTRAM) 50 MG tablet Take 50 mg by mouth every 6 (six) hours as needed. Give 2 tablets by mouth every 6 hours as needed for severe pain.   Zinc Oxide (TRIPLE PASTE) 12.8 % ointment Apply 1 Application topically. To peri area and skin folds topically twice daily   [DISCONTINUED] ALPRAZolam (XANAX) 0.5 MG tablet Take 1 tablet (0.5 mg total) by mouth at bedtime.   [DISCONTINUED] bacitracin-polymyxin b (POLYSPORIN) ointment Apply topically daily.   [DISCONTINUED] ergocalciferol (VITAMIN D2) 50000 UNITS capsule Take 50,000 Units by mouth once a week.   No facility-administered encounter medications on file as of 10/03/2023.    Review of Systems  Immunization History  Administered Date(s) Administered   Influenza, High Dose Seasonal PF 11/23/2022   PFIZER(Purple Top)SARS-COV-2 Vaccination 01/07/2020, 01/27/2020, 11/18/2020   Pertinent  Health Maintenance Due  Topic Date Due   DEXA SCAN  Never done   INFLUENZA VACCINE  07/19/2023       No data to display         Functional Status Survey:    Vitals:   10/03/23 1116  BP: 126/66  Pulse: 94  Resp: 18  Temp: (!) 97.2 F (36.2 C)  SpO2: 94%  Weight: 178 lb (80.7 kg)  Height: 5\' 6"  (1.676 m)   Body mass index is 28.73 kg/m. Physical Exam Skin:    Comments: Left forearm with 1 cm circular ulceration with small amount of surrounding erythema.  Visible base with granulation tissue.  Neurological:     Mental Status: She is alert.     Labs reviewed: Recent Labs    03/12/23 0425 03/13/23 0156 03/14/23 0217 03/29/23 0000 04/18/23 0000 04/30/23 0000  NA 136 134* 133* 135* 133* 138  K 3.4* 3.9 4.2 4.1 3.5 3.6  CL 103 96* 95* 100 98* 97*  CO2 25 24 27  29*  30* 34*  GLUCOSE 187* 161* 169*  --   --   --   BUN 14 20 27* 17 14 23*  CREATININE 0.93 1.25* 1.03* 0.7 0.7 0.8  CALCIUM 8.8* 8.7* 8.5* 8.3* 8.9 8.4*   Recent Labs    03/13/23 0156 03/14/23 0217 03/29/23 0000  AST 29 20 15   ALT 13 10 6*  ALKPHOS 40 40 155*  BILITOT 0.4 0.8  --   PROT 5.8* 5.7*  --   ALBUMIN 2.8* 2.7* 2.7*   Recent Labs    03/13/23 0156 03/14/23 0217 03/15/23 0242 03/15/23 0926 03/29/23 0000 04/18/23 0000 04/30/23 0000 07/05/23 0000  WBC 14.5* 15.7* 13.9*   < > 7.1 9.2 8.3 7.5  NEUTROABS  --   --   --   --  4,672.00 6,201.00 4,930.00  --   HGB 10.3* 9.1* 8.8*   < > 10.6* 12.2 12.8 12.4  HCT 31.7* 27.9* 26.0*   < > 33* 38 40 38  MCV 94.1 94.3 93.2  --   --   --   --   --   PLT 232 218 250  --  406* 342 413* 270   < > = values in this interval not displayed.   Lab Results  Component Value Date   TSH 4.35 08/27/2023   Lab Results  Component Value Date   HGBA1C 6.4 06/28/2023   Lab Results  Component Value Date   CHOL 187 03/04/2018   HDL 44 03/04/2018   LDLCALC 90 03/04/2018   TRIG 267 (H) 03/04/2018   CHOLHDL 4.3 03/04/2018    Significant Diagnostic Results in last 30 days:  No results found.  Assessment/Plan Arm wound, left, subsequent encounter Patient with a left arm wound which has been present for many weeks at this time.  Some concern for skin reaction secondary to topical antimicrobial agents.  Will transition to Vaseline at this time.  If no resolution of wound, will consider discussion of biopsy for squamous cell carcinoma.  At this time this seems less likely given the progression from a slough base to now healthy pink tissue.  Family/ staff Communication: Nursing  Labs/tests ordered: None

## 2023-10-04 ENCOUNTER — Other Ambulatory Visit: Payer: Self-pay | Admitting: Nurse Practitioner

## 2023-10-04 ENCOUNTER — Encounter: Payer: Self-pay | Admitting: Student

## 2023-10-04 DIAGNOSIS — F1320 Sedative, hypnotic or anxiolytic dependence, uncomplicated: Secondary | ICD-10-CM

## 2023-10-04 MED ORDER — ALPRAZOLAM 1 MG PO TABS
1.0000 mg | ORAL_TABLET | Freq: Every day | ORAL | 0 refills | Status: DC
Start: 2023-10-04 — End: 2023-12-14

## 2023-10-05 ENCOUNTER — Non-Acute Institutional Stay (SKILLED_NURSING_FACILITY): Payer: Medicare Other | Admitting: Student

## 2023-10-05 DIAGNOSIS — W19XXXD Unspecified fall, subsequent encounter: Secondary | ICD-10-CM | POA: Diagnosis not present

## 2023-10-05 DIAGNOSIS — R440 Auditory hallucinations: Secondary | ICD-10-CM

## 2023-10-05 DIAGNOSIS — R441 Visual hallucinations: Secondary | ICD-10-CM | POA: Diagnosis not present

## 2023-10-05 DIAGNOSIS — F1320 Sedative, hypnotic or anxiolytic dependence, uncomplicated: Secondary | ICD-10-CM

## 2023-10-06 ENCOUNTER — Encounter: Payer: Self-pay | Admitting: Student

## 2023-10-06 NOTE — Progress Notes (Signed)
Location:  Other Nursing Home Room Number: [facility] 419A Place of Service:  SNF (31) Provider:  Ander Gaster, Benetta Spar, MD  Patient Care Team: [name], MD as PCP - General (Family Medicine) [name], MD (Inactive) as PCP - Cardiology (Cardiology) [name], MD as PCP - Electrophysiology (Cardiology)  Extended Emergency Contact Information Primary Emergency Contact: Sofie Rower [place] Home Phone: [phone] Relation: Daughter Secondary Emergency Contact: Rudy,Danny Mobile Phone: [phone] Relation: Son  Code Status:  DNR Goals of care: Advanced Directive information    10/03/2023   11:25 AM  Advanced Directives  Does Patient Have a Medical Advance Directive? Yes  Type of Advance Directive Out of facility DNR (pink MOST or yellow form)  Does patient want to make changes to medical advance directive? No - Patient declined     No chief complaint on file.   HPI:  Pt is a 89 y.o. female seen today for an acute visit for follow-up after a fall.  Patient had a fall on 1015 unwitnessed.  Story inconsistent.  Patient states that this time that she was reaching for her phone and her feet slipped from underneath her.  Per nursing phone was in the appropriate location and patient still fell.  Patient did not ask for help, but the fall at states that she fell directly on her head.  She was not sent to the emergency department.  She does have a bruise on her scalp.  Per nursing patient has had visual hallucinations with bugs.   Past Medical History:  Diagnosis Date   Back pain    Bruises easily    Coronary artery disease    NONOBSTRUCTIVE   Fatigue    Hyperlipidemia    Hypertension    Knee pain    OA (osteoarthritis)    Obesity    Palpitations    SOB (shortness of breath)    Past Surgical History:  Procedure Laterality Date   BREAST BIOPSY     LEFT BREAST   CARDIAC CATHETERIZATION  05/11/2006   OVERALL CARDIAC SIZE  AND SILHOUETTE ARE NORMAL. EF ESTIMATED 60%   INTRAMEDULLARY (IM) NAIL INTERTROCHANTERIC Left 03/12/2023   Procedure: INTRAMEDULLARY (IM) NAIL INTERTROCHANTERIC;  Surgeon: Roby Lofts, MD;  Location: [facility] OR;  Service: Orthopedics;  Laterality: Left;   KIDNEY STONES  1982   KNEE ARTHROSCOPY  09/22/2008   VAGINAL HYSTERECTOMY      Allergies  Allergen Reactions   Codeine Other (See Comments)    Unknown reaction   Demerol [Meperidine Hcl] Other (See Comments)    Unknown reaction   Morphine And Codeine Other (See Comments)    Unknown reaction    Outpatient Encounter Medications as of 10/05/2023  Medication Sig   acetaminophen (TYLENOL) 325 MG tablet Take 650 mg by mouth every 6 (six) hours as needed.   acetaminophen (TYLENOL) 325 MG tablet Take 650 mg by mouth every 4 (four) hours as needed.   ALPRAZolam (XANAX) 1 MG tablet Take 1 tablet (1 mg total) by mouth at bedtime.   alum & mag hydroxide-simeth (MAALOX PLUS) 400-400-40 MG/5ML suspension Take 5 mLs by mouth every 4 (four) hours as needed for indigestion.   apixaban (ELIQUIS) 5 MG TABS tablet Take 1 tablet (5 mg total) by mouth 2 (two) times daily.   bisacodyl (DULCOLAX) 10 MG suppository Place 10 mg rectally daily. As needed for constipation   Cholecalciferol (VITAMIN D-3) 25 MCG (1000 UT) CAPS One by mouth daily.   estradiol (  ESTRACE) 0.1 MG/GM vaginal cream Place 1 Applicatorful vaginally. At bedtime every Tuesday and Friday   famotidine (PEPCID) 10 MG tablet Take 10 mg by mouth at bedtime.   furosemide (LASIX) 20 MG tablet TAKE 1 TABLET EVERY DAY   hydroxypropyl methylcellulose / hypromellose (ISOPTO TEARS / GONIOVISC) 2.5 % ophthalmic solution Place 1 drop into both eyes every 4 (four) hours as needed for dry eyes.   metoprolol tartrate (LOPRESSOR) 100 MG tablet Take 100 mg by mouth 2 (two) times daily.   metoprolol tartrate (LOPRESSOR) 50 MG tablet Take 50 mg by mouth as directed. Every 24 hours as needed for heart palpitations    mupirocin ointment (BACTROBAN) 2 % Apply to Left forearm topically every day shift   nitroGLYCERIN (NITROSTAT) 0.4 MG SL tablet Place 1 tablet (0.4 mg total) under the tongue every 5 (five) minutes as needed for chest pain.   ondansetron (ZOFRAN) 4 MG tablet Take 4 mg by mouth every 8 (eight) hours as needed for nausea or vomiting.   polyethylene glycol (MIRALAX / GLYCOLAX) 17 g packet Take 17 g by mouth daily. And every 24 hours as needed for constipation   traMADol (ULTRAM) 50 MG tablet Take 50 mg by mouth every 6 (six) hours as needed. Give 2 tablets by mouth every 6 hours as needed for severe pain.   Zinc Oxide (TRIPLE PASTE) 12.8 % ointment Apply 1 Application topically. To peri area and skin folds topically twice daily   No facility-administered encounter medications on file as of 10/05/2023.    Review of Systems  Immunization History  Administered Date(s) Administered   Influenza, High Dose Seasonal PF 11/23/2022   PFIZER(Purple Top)SARS-COV-2 Vaccination 01/07/2020, 01/27/2020, 11/18/2020   Pertinent  Health Maintenance Due  Topic Date Due   DEXA SCAN  Never done   INFLUENZA VACCINE  07/19/2023       No data to display         Functional Status Survey:    There were no vitals filed for this visit. There is no height or weight on file to calculate BMI. Physical Exam Constitutional:      Appearance: Normal appearance.  Cardiovascular:     Rate and Rhythm: Normal rate.  Pulmonary:     Effort: Pulmonary effort is normal.  Abdominal:     Palpations: Abdomen is soft.  Skin:    Comments: 3 cm bruise with central yellowing.  Neurological:     Mental Status: She is alert. Mental status is at baseline.     Labs reviewed: Recent Labs    03/12/23 0425 03/13/23 0156 03/14/23 0217 03/29/23 0000 04/18/23 0000 04/30/23 0000  NA 136 134* 133* 135* 133* 138  K 3.4* 3.9 4.2 4.1 3.5 3.6  CL 103 96* 95* 100 98* 97*  CO2 25 24 27  29* 30* 34*  GLUCOSE 187* 161* 169*   --   --   --   BUN 14 20 27* 17 14 23*  CREATININE 0.93 1.25* 1.03* 0.7 0.7 0.8  CALCIUM 8.8* 8.7* 8.5* 8.3* 8.9 8.4*   Recent Labs    03/13/23 0156 03/14/23 0217 03/29/23 0000  AST 29 20 15   ALT 13 10 6*  ALKPHOS 40 40 155*  BILITOT 0.4 0.8  --   PROT 5.8* 5.7*  --   ALBUMIN 2.8* 2.7* 2.7*   Recent Labs    03/13/23 0156 03/14/23 0217 03/15/23 0242 03/15/23 0926 03/29/23 0000 04/18/23 0000 04/30/23 0000 07/05/23 0000  WBC 14.5* 15.7* 13.9*   < >  7.1 9.2 8.3 7.5  NEUTROABS  --   --   --   --  4,672.00 6,201.00 4,930.00  --   HGB 10.3* 9.1* 8.8*   < > 10.6* 12.2 12.8 12.4  HCT 31.7* 27.9* 26.0*   < > 33* 38 40 38  MCV 94.1 94.3 93.2  --   --   --   --   --   PLT 232 218 250  --  406* 342 413* 270   < > = values in this interval not displayed.   Lab Results  Component Value Date   TSH 4.35 08/27/2023   Lab Results  Component Value Date   HGBA1C 6.4 06/28/2023   Lab Results  Component Value Date   CHOL 187 03/04/2018   HDL 44 03/04/2018   LDLCALC 90 03/04/2018   TRIG 267 (H) 03/04/2018   CHOLHDL 4.3 03/04/2018    Significant Diagnostic Results in last 30 days:  No results found.  Assessment/Plan Auditory hallucinations  Fall, subsequent encounter  Benzodiazepine dependence (HCC)  Visual hallucinations Patient with history of auditory hallucinations, now having visual hallucinations. No obvious signs of infection. Recent fall, however, hallucinations began prior. Visual hallucinations likely related to underlying memory changes. Benzodiazepine dependence where missed or late dose increases hallucinations, continue education for nursing and staff for timing of medication. Small bruise of left parietal scalp, healing. Continue supportive care. NO other injury incurred with fall. Encourage use of call bell.    Family/ staff Communication: nursing  Labs/tests ordered:  none

## 2023-10-07 ENCOUNTER — Telehealth: Payer: Self-pay | Admitting: Internal Medicine

## 2023-10-07 NOTE — Telephone Encounter (Signed)
[facility] saying she had episode of Chest pain Few hours ago  Got Nitro Pain got better But now her BP is elevated at 185/98 Ordered to give her One dose of Hydralazine 25 mg one time Recheck BP in 2 hours and will let me know

## 2023-10-08 ENCOUNTER — Non-Acute Institutional Stay (SKILLED_NURSING_FACILITY): Payer: Self-pay | Admitting: Student

## 2023-10-08 ENCOUNTER — Encounter: Payer: Self-pay | Admitting: Student

## 2023-10-08 DIAGNOSIS — I10 Essential (primary) hypertension: Secondary | ICD-10-CM | POA: Diagnosis not present

## 2023-10-08 NOTE — Progress Notes (Unsigned)
Location:  Other [facility].  Nursing Home Room Number: [facility]A Place of Service:  SNF [phone]) Provider:  [name], MD  Patient Care Team: [name], MD as PCP - General (Family Medicine) [name], MD (Inactive) as PCP - Cardiology (Cardiology) [name], MD as PCP - Electrophysiology (Cardiology)  Extended Emergency Contact Information Primary Emergency Contact: Sofie Rower [place] Home Phone: [phone] Relation: Daughter Secondary Emergency Contact: Bedingfield,Danny Mobile Phone: [phone] Relation: Son  Code Status:  DNR Goals of care: Advanced Directive information    10/08/2023   10:30 AM  Advanced Directives  Does Patient Have a Medical Advance Directive? Yes  Type of Advance Directive Out of facility DNR (pink MOST or yellow form)  Does patient want to make changes to medical advance directive? No - Patient declined     Chief Complaint  Patient presents with   Acute Visit    Blood Pressure Management     HPI:  Pt is a 89 y.o. female seen today for an acute visit for Blood Pressure Management.    Past Medical History:  Diagnosis Date   Back pain    Bruises easily    Coronary artery disease    NONOBSTRUCTIVE   Fatigue    Hyperlipidemia    Hypertension    Knee pain    OA (osteoarthritis)    Obesity    Palpitations    SOB (shortness of breath)    Past Surgical History:  Procedure Laterality Date   BREAST BIOPSY     LEFT BREAST   CARDIAC CATHETERIZATION  05/11/2006   OVERALL CARDIAC SIZE AND SILHOUETTE ARE NORMAL. EF ESTIMATED 60%   INTRAMEDULLARY (IM) NAIL INTERTROCHANTERIC Left 03/12/2023   Procedure: INTRAMEDULLARY (IM) NAIL INTERTROCHANTERIC;  Surgeon: Roby Lofts, MD;  Location: [facility] OR;  Service: Orthopedics;  Laterality: Left;   KIDNEY STONES  1982   KNEE ARTHROSCOPY  09/22/2008   VAGINAL HYSTERECTOMY      Allergies  Allergen Reactions   Codeine Other (See Comments)     Unknown reaction   Demerol [Meperidine Hcl] Other (See Comments)    Unknown reaction   Morphine And Codeine Other (See Comments)    Unknown reaction    Outpatient Encounter Medications as of 10/08/2023  Medication Sig   acetaminophen (TYLENOL) 325 MG tablet Take 650 mg by mouth every 6 (six) hours as needed.   acetaminophen (TYLENOL) 325 MG tablet Take 650 mg by mouth every 4 (four) hours as needed.   ALPRAZolam (XANAX) 1 MG tablet Take 1 tablet (1 mg total) by mouth at bedtime.   alum & mag hydroxide-simeth (MAALOX PLUS) 400-400-40 MG/5ML suspension Take 5 mLs by mouth every 4 (four) hours as needed for indigestion.   apixaban (ELIQUIS) 5 MG TABS tablet Take 1 tablet (5 mg total) by mouth 2 (two) times daily.   bisacodyl (DULCOLAX) 10 MG suppository Place 10 mg rectally daily. As needed for constipation   Cholecalciferol (VITAMIN D-3) 25 MCG (1000 UT) CAPS One by mouth daily.   estradiol (ESTRACE) 0.1 MG/GM vaginal cream Place 1 Applicatorful vaginally. At bedtime every Tuesday and Friday   famotidine (PEPCID) 10 MG tablet Take 10 mg by mouth at bedtime.   furosemide (LASIX) 20 MG tablet TAKE 1 TABLET EVERY DAY   hydrALAZINE (APRESOLINE) 25 MG tablet Take 25 mg by mouth daily.   hydroxypropyl methylcellulose / hypromellose (ISOPTO TEARS / GONIOVISC) 2.5 % ophthalmic solution Place 1 drop into both eyes every 4 (  four) hours as needed for dry eyes.   metoprolol tartrate (LOPRESSOR) 100 MG tablet Take 100 mg by mouth 2 (two) times daily.   metoprolol tartrate (LOPRESSOR) 50 MG tablet Take 50 mg by mouth as directed. Every 24 hours as needed for heart palpitations   mupirocin ointment (BACTROBAN) 2 % Apply to Left forearm topically every day shift   nitroGLYCERIN (NITROSTAT) 0.4 MG SL tablet Place 1 tablet (0.4 mg total) under the tongue every 5 (five) minutes as needed for chest pain.   ondansetron (ZOFRAN) 4 MG tablet Take 4 mg by mouth every 8 (eight) hours as needed for nausea or vomiting.    polyethylene glycol (MIRALAX / GLYCOLAX) 17 g packet Take 17 g by mouth daily. And every 24 hours as needed for constipation   traMADol (ULTRAM) 50 MG tablet Take 50 mg by mouth every 6 (six) hours as needed. Give 2 tablets by mouth every 6 hours as needed for severe pain.   Zinc Oxide (TRIPLE PASTE) 12.8 % ointment Apply 1 Application topically. To peri area and skin folds topically twice daily   No facility-administered encounter medications on file as of 10/08/2023.    Review of Systems  Immunization History  Administered Date(s) Administered   Influenza, High Dose Seasonal PF 11/23/2022   PFIZER(Purple Top)SARS-COV-2 Vaccination 01/07/2020, 01/27/2020, 11/18/2020   Pertinent  Health Maintenance Due  Topic Date Due   DEXA SCAN  Never done   INFLUENZA VACCINE  07/19/2023       No data to display         Functional Status Survey:    Vitals:   10/08/23 1018 10/08/23 1032  BP: (!) 148/82 (!) 166/76  Pulse: 64   Resp: 20   Temp: (!) 97.3 F (36.3 C)   SpO2: 94%   Weight: 178 lb (80.7 kg)   Height: 5\' 6"  (1.676 m)    Body mass index is 28.73 kg/m. Physical Exam  Labs reviewed: Recent Labs    03/12/23 0425 03/13/23 0156 03/14/23 0217 03/29/23 0000 04/18/23 0000 04/30/23 0000  NA 136 134* 133* 135* 133* 138  K 3.4* 3.9 4.2 4.1 3.5 3.6  CL 103 96* 95* 100 98* 97*  CO2 25 24 27  29* 30* 34*  GLUCOSE 187* 161* 169*  --   --   --   BUN 14 20 27* 17 14 23*  CREATININE 0.93 1.25* 1.03* 0.7 0.7 0.8  CALCIUM 8.8* 8.7* 8.5* 8.3* 8.9 8.4*   Recent Labs    03/13/23 0156 03/14/23 0217 03/29/23 0000  AST 29 20 15   ALT 13 10 6*  ALKPHOS 40 40 155*  BILITOT 0.4 0.8  --   PROT 5.8* 5.7*  --   ALBUMIN 2.8* 2.7* 2.7*   Recent Labs    03/13/23 0156 03/14/23 0217 03/15/23 0242 03/15/23 0926 03/29/23 0000 04/18/23 0000 04/30/23 0000 07/05/23 0000  WBC 14.5* 15.7* 13.9*   < > 7.1 9.2 8.3 7.5  NEUTROABS  --   --   --   --  4,672.00 6,201.00 4,930.00  --    HGB 10.3* 9.1* 8.8*   < > 10.6* 12.2 12.8 12.4  HCT 31.7* 27.9* 26.0*   < > 33* 38 40 38  MCV 94.1 94.3 93.2  --   --   --   --   --   PLT 232 218 250  --  406* 342 413* 270   < > = values in this interval not displayed.   Lab Results  Component Value Date   TSH 4.35 08/27/2023   Lab Results  Component Value Date   HGBA1C 6.4 06/28/2023   Lab Results  Component Value Date   CHOL 187 03/04/2018   HDL 44 03/04/2018   LDLCALC 90 03/04/2018   TRIG 267 (H) 03/04/2018   CHOLHDL 4.3 03/04/2018    Significant Diagnostic Results in last 30 days:  No results found.  Assessment/Plan There are no diagnoses linked to this encounter.   Family/ staff Communication: ***  Labs/tests ordered:  ***

## 2023-10-15 DIAGNOSIS — E1159 Type 2 diabetes mellitus with other circulatory complications: Secondary | ICD-10-CM | POA: Diagnosis not present

## 2023-10-19 DIAGNOSIS — R079 Chest pain, unspecified: Secondary | ICD-10-CM | POA: Diagnosis not present

## 2023-10-22 DIAGNOSIS — N390 Urinary tract infection, site not specified: Secondary | ICD-10-CM | POA: Diagnosis not present

## 2023-10-22 DIAGNOSIS — I10 Essential (primary) hypertension: Secondary | ICD-10-CM | POA: Diagnosis not present

## 2023-10-22 DIAGNOSIS — S50811A Abrasion of right forearm, initial encounter: Secondary | ICD-10-CM | POA: Diagnosis not present

## 2023-10-22 LAB — CBC AND DIFFERENTIAL
HCT: 44 (ref 36–46)
Hemoglobin: 14.1 (ref 12.0–16.0)
Platelets: 270 10*3/uL (ref 150–400)
WBC: 7.3

## 2023-10-22 LAB — BASIC METABOLIC PANEL
BUN: 15 (ref 4–21)
CO2: 32 — AB (ref 13–22)
Chloride: 99 (ref 99–108)
Creatinine: 0.8 (ref 0.5–1.1)
Glucose: 102
Potassium: 3.6 meq/L (ref 3.5–5.1)
Sodium: 137 (ref 137–147)

## 2023-10-22 LAB — COMPREHENSIVE METABOLIC PANEL: eGFR: 64

## 2023-10-22 LAB — CBC: RBC: 4.68 (ref 3.87–5.11)

## 2023-10-24 DIAGNOSIS — M6281 Muscle weakness (generalized): Secondary | ICD-10-CM | POA: Diagnosis not present

## 2023-10-24 DIAGNOSIS — Z9181 History of falling: Secondary | ICD-10-CM | POA: Diagnosis not present

## 2023-10-24 DIAGNOSIS — Z741 Need for assistance with personal care: Secondary | ICD-10-CM | POA: Diagnosis not present

## 2023-10-24 DIAGNOSIS — S72002D Fracture of unspecified part of neck of left femur, subsequent encounter for closed fracture with routine healing: Secondary | ICD-10-CM | POA: Diagnosis not present

## 2023-10-25 ENCOUNTER — Other Ambulatory Visit: Payer: Self-pay | Admitting: Nurse Practitioner

## 2023-10-25 MED ORDER — TRAMADOL HCL 50 MG PO TABS: 50.0000 mg | ORAL_TABLET | Freq: Four times a day (QID) | ORAL | 0 refills | Status: DC | PRN

## 2023-10-30 ENCOUNTER — Encounter: Payer: Self-pay | Admitting: Nurse Practitioner

## 2023-10-30 ENCOUNTER — Non-Acute Institutional Stay (SKILLED_NURSING_FACILITY): Payer: Self-pay | Admitting: Nurse Practitioner

## 2023-10-30 DIAGNOSIS — F1320 Sedative, hypnotic or anxiolytic dependence, uncomplicated: Secondary | ICD-10-CM | POA: Diagnosis not present

## 2023-10-30 DIAGNOSIS — R42 Dizziness and giddiness: Secondary | ICD-10-CM

## 2023-10-30 DIAGNOSIS — I4821 Permanent atrial fibrillation: Secondary | ICD-10-CM

## 2023-10-30 DIAGNOSIS — I10 Essential (primary) hypertension: Secondary | ICD-10-CM

## 2023-10-30 DIAGNOSIS — I5032 Chronic diastolic (congestive) heart failure: Secondary | ICD-10-CM | POA: Diagnosis not present

## 2023-10-30 DIAGNOSIS — K5904 Chronic idiopathic constipation: Secondary | ICD-10-CM

## 2023-10-30 NOTE — Progress Notes (Unsigned)
Location:  Other [facility].  Nursing Home Room Number: [facility] 419A Place of Service:  SNF [phone]) [name], NP  PCP: [name], MD  Patient Care Team: [name], MD as PCP - General (Family Medicine) [name], MD (Inactive) as PCP - Cardiology (Cardiology) [name], MD as PCP - Electrophysiology (Cardiology)  Extended Emergency Contact Information Primary Emergency Contact: Sofie Rower [place] Home Phone: [phone] Relation: Daughter Secondary Emergency Contact: Converse,Danny Mobile Phone: [phone] Relation: Son  Goals of care: Advanced Directive information    10/30/2023   12:04 PM  Advanced Directives  Does Patient Have a Medical Advance Directive? Yes  Type of Advance Directive Out of facility DNR (pink MOST or yellow form)  Does patient want to make changes to medical advance directive? No - Patient declined     Chief Complaint  Patient presents with   Medical Management of Chronic Issues    Medical Management of Chronic Issues.     HPI:  Pt is a 89 y.o. female seen today for medical management of chronic disease.  Pt with hx of htn, chest pains, fall, hyperlipidemia, OA.  Pt reports she is dizzy when changing positions. Only dizzy at that time. No dizzy when sitting or laying down. States she gets dizzy when trying to get up- suspect this is what lead to last fall because she reported she does not call for help and does not think she needs help.  States she like to add salt to taste food even though she has high blood pressure and swelling to legs does not feel like she can change this.   Continues to have intermediate chest pains, this is not new- appears having issues back at last cardiologist appt- noted to have increase in palpitations due to a fib. She was last seen in February but has not had follow up since she has been at [facility].     Past Medical History:  Diagnosis Date   Back pain     Bruises easily    Coronary artery disease    NONOBSTRUCTIVE   Fatigue    Hyperlipidemia    Hypertension    Knee pain    OA (osteoarthritis)    Obesity    Palpitations    SOB (shortness of breath)    Past Surgical History:  Procedure Laterality Date   BREAST BIOPSY     LEFT BREAST   CARDIAC CATHETERIZATION  05/11/2006   OVERALL CARDIAC SIZE AND SILHOUETTE ARE NORMAL. EF ESTIMATED 60%   INTRAMEDULLARY (IM) NAIL INTERTROCHANTERIC Left 03/12/2023   Procedure: INTRAMEDULLARY (IM) NAIL INTERTROCHANTERIC;  Surgeon: Roby Lofts, MD;  Location: [facility] OR;  Service: Orthopedics;  Laterality: Left;   KIDNEY STONES  1982   KNEE ARTHROSCOPY  09/22/2008   VAGINAL HYSTERECTOMY      Allergies  Allergen Reactions   Codeine Other (See Comments)    Unknown reaction   Demerol [Meperidine Hcl] Other (See Comments)    Unknown reaction   Morphine And Codeine Other (See Comments)    Unknown reaction    Outpatient Encounter Medications as of 10/30/2023  Medication Sig   acetaminophen (TYLENOL) 325 MG tablet Take 650 mg by mouth every 6 (six) hours as needed.   acetaminophen (TYLENOL) 325 MG tablet Take 650 mg by mouth every 4 (four) hours as needed.   ALPRAZolam (XANAX) 1 MG tablet Take 1 tablet (1 mg total) by mouth at bedtime.   alum & mag hydroxide-simeth (MAALOX PLUS)  400-400-40 MG/5ML suspension Take 5 mLs by mouth every 4 (four) hours as needed for indigestion.   amoxicillin-clavulanate (AUGMENTIN) 875-125 MG tablet Take 1 tablet by mouth 2 (two) times daily.   apixaban (ELIQUIS) 5 MG TABS tablet Take 1 tablet (5 mg total) by mouth 2 (two) times daily.   bisacodyl (DULCOLAX) 10 MG suppository Place 10 mg rectally daily. As needed for constipation   Cholecalciferol (VITAMIN D-3) 25 MCG (1000 UT) CAPS One by mouth daily.   estradiol (ESTRACE) 0.1 MG/GM vaginal cream Place 1 Applicatorful vaginally. At bedtime every Tuesday and Friday   famotidine (PEPCID) 10 MG tablet Take 10 mg by mouth at  bedtime.   furosemide (LASIX) 20 MG tablet TAKE 1 TABLET EVERY DAY (Patient taking differently: Take 40 mg by mouth daily.)   hydroxypropyl methylcellulose / hypromellose (ISOPTO TEARS / GONIOVISC) 2.5 % ophthalmic solution Place 1 drop into both eyes every 4 (four) hours as needed for dry eyes.   losartan (COZAAR) 50 MG tablet Take 50 mg by mouth daily.   metoprolol tartrate (LOPRESSOR) 100 MG tablet Take 100 mg by mouth 2 (two) times daily.   metoprolol tartrate (LOPRESSOR) 50 MG tablet Take 50 mg by mouth as directed. Every 24 hours as needed for heart palpitations   nitroGLYCERIN (NITROSTAT) 0.4 MG SL tablet Place 1 tablet (0.4 mg total) under the tongue every 5 (five) minutes as needed for chest pain.   ondansetron (ZOFRAN) 4 MG tablet Take 4 mg by mouth every 8 (eight) hours as needed for nausea or vomiting.   polyethylene glycol (MIRALAX / GLYCOLAX) 17 g packet Take 17 g by mouth daily. And every 24 hours as needed for constipation   traMADol (ULTRAM) 50 MG tablet Take 1 tablet (50 mg total) by mouth every 6 (six) hours as needed. Give 2 tablets by mouth every 6 hours as needed for severe pain.   Zinc Oxide (TRIPLE PASTE) 12.8 % ointment Apply 1 Application topically. To peri area and skin folds topically twice daily   hydrALAZINE (APRESOLINE) 25 MG tablet Take 25 mg by mouth daily. (Patient not taking: Reported on 10/30/2023)   [DISCONTINUED] mupirocin ointment (BACTROBAN) 2 % Apply to Left forearm topically every day shift   No facility-administered encounter medications on file as of 10/30/2023.    Review of Systems ***  Immunization History  Administered Date(s) Administered   Influenza, High Dose Seasonal PF 11/23/2022   Influenza-Unspecified 10/10/2023   PFIZER(Purple Top)SARS-COV-2 Vaccination 01/07/2020, 01/27/2020, 11/18/2020   Pertinent  Health Maintenance Due  Topic Date Due   DEXA SCAN  Never done   INFLUENZA VACCINE  Completed       No data to display          Functional Status Survey:    Vitals:   10/30/23 1151 10/30/23 1207  BP: (!) 166/73 (!) 166/79  Pulse: 61   Resp: (!) 76   Temp: (!) 97.4 F (36.3 C)   SpO2: 94%   Weight: 175 lb 3.2 oz (79.5 kg)   Height: 5\' 6"  (1.676 m)    Body mass index is 28.28 kg/m. Physical Exam***  Labs reviewed: Recent Labs    03/12/23 0425 03/13/23 0156 03/14/23 0217 03/14/23 0217 03/29/23 0000 04/18/23 0000 04/30/23 0000 10/22/23 0000  NA 136 134* 133*   < > 135* 133* 138 137  K 3.4* 3.9 4.2  --  4.1 3.5 3.6 3.6  CL 103 96* 95*  --  100 98* 97* 99  CO2 25 24  27  --  29* 30* 34* 32*  GLUCOSE 187* 161* 169*  --   --   --   --   --   BUN 14 20 27*   < > 17 14 23* 15  CREATININE 0.93 1.25* 1.03*   < > 0.7 0.7 0.8 0.8  CALCIUM 8.8* 8.7* 8.5*  --  8.3* 8.9 8.4*  --    < > = values in this interval not displayed.   Recent Labs    03/13/23 0156 03/14/23 0217 03/29/23 0000  AST 29 20 15   ALT 13 10 6*  ALKPHOS 40 40 155*  BILITOT 0.4 0.8  --   PROT 5.8* 5.7*  --   ALBUMIN 2.8* 2.7* 2.7*   Recent Labs    03/13/23 0156 03/14/23 0217 03/15/23 0242 03/15/23 0926 03/29/23 0000 04/18/23 0000 04/30/23 0000 07/05/23 0000 10/22/23 0000  WBC 14.5* 15.7* 13.9*   < > 7.1 9.2 8.3 7.5 7.3  NEUTROABS  --   --   --   --  4,672.00 6,201.00 4,930.00  --   --   HGB 10.3* 9.1* 8.8*   < > 10.6* 12.2 12.8 12.4 14.1  HCT 31.7* 27.9* 26.0*   < > 33* 38 40 38 44  MCV 94.1 94.3 93.2  --   --   --   --   --   --   PLT 232 218 250  --  406* 342 413* 270 270   < > = values in this interval not displayed.   Lab Results  Component Value Date   TSH 4.35 08/27/2023   Lab Results  Component Value Date   HGBA1C 6.4 06/28/2023   Lab Results  Component Value Date   CHOL 187 03/04/2018   HDL 44 03/04/2018   LDLCALC 90 03/04/2018   TRIG 267 (H) 03/04/2018   CHOLHDL 4.3 03/04/2018    Significant Diagnostic Results in last 30 days:  No results found.  Assessment/Plan 1. Hypertension, unspecified  type ***  2. Benzodiazepine dependence (HCC) ***  3. Chronic heart failure with preserved ejection fraction (HFpEF) (HCC) ***  4. Permanent atrial fibrillation (HCC) ***  5. Chronic idiopathic constipation ***  6. Dizziness ***   Karynn Deblasi K. Biag[facility] [phone]

## 2023-10-31 DIAGNOSIS — M6281 Muscle weakness (generalized): Secondary | ICD-10-CM | POA: Diagnosis not present

## 2023-10-31 DIAGNOSIS — Z9181 History of falling: Secondary | ICD-10-CM | POA: Diagnosis not present

## 2023-10-31 DIAGNOSIS — S72002D Fracture of unspecified part of neck of left femur, subsequent encounter for closed fracture with routine healing: Secondary | ICD-10-CM | POA: Diagnosis not present

## 2023-10-31 DIAGNOSIS — Z741 Need for assistance with personal care: Secondary | ICD-10-CM | POA: Diagnosis not present

## 2023-11-04 NOTE — Progress Notes (Unsigned)
Cardiology Clinic Note   Date: 11/05/2023 ID: Tanya Ho, DOB 1926/04/05, MRN [id number]  Primary Cardiologist:  [name], MD (Inactive)  Electrophysiologist:  Will Jorja Loa, MD {  Patient Profile    Tanya Ho is a 89 y.o. female who presents to the clinic today for routine follow up.     Past medical history significant for: Nonobstructive CAD. LHC 05/11/2006 (atypical chest pain): Extensive somewhat diffuse coronary atherosclerosis with calcification in the LAD and RCA with somewhat diffuse scattered irregularities with calcification.  Recommend medical management. Chronic HFpEF. Echo 03/14/2018: EF 55 to 60%.  Mild RAE.  Mild to moderate TR. Permanent A-fib. Onset March 2019. Carotid artery disease. Carotid duplex 01/11/2021: Bilateral ICA 1 to 39%. Hypertension. Hyperlipidemia. CKD stage IIIa.  In summary, Tanya Ho is a longtime patient of cardiology.  She was initially followed by Dr. Korbyn Vanes Chalk.  She has a history of diffuse coronary atherosclerosis per angiography for which she was treated with Plavix.  Corrie Mckusick, NP on 03/04/2018 for routine follow-up.  She was found to be in A-fib with controlled ventricular rate.  Plavix was discontinued and she was started on Eliquis.  She was seen by DOD, Dr. Elberta Fortis, on 08/30/2021 for complaints of new onset lower extremity edema with associated dyspnea.  She was given a 4-day course of Lasix to then transition to as needed dosing.     History of Present Illness    Tanya Ho is followed by general cardiology for the above outlined history  Patient was last seen in the office by Dr. Shari Prows on 02/01/2023 for routine follow-up.  She was doing well at that time with occasional episodes of A-fib causing chest discomfort.  She also reported continued episodes of heart racing in the middle of the night waking her and making her feel anxious.  She states BP at that time will run as high as 190/100 but outside of these  episodes BP is well-controlled.  BP was elevated at the time of her visit.  She was instructed to send a BP log for review.  No medication changes were made.  Patient underwent hospital admission from 03/11/2023 to 03/15/2023 for fall resulting in fractured femur.  She underwent cephalomedullary nailing of left intertrochanteric femur fracture.  Discussed the use of AI scribe software for clinical note transcription with the patient, who gave verbal consent to proceed.  Patient presents with her son and daughter in law from [facility] for routine follow-up. She reports intermittent chest discomfort described as an aching sensation in her chest that typically occurs when she is in bed and is unchanged for years. She and her family feel there are 2 elements contributing to chest discomfort - anxiety and reflux. Patient's son states when she lived at home he lived close by and she would often call him because she was experiencing chest discomfort that would respond to reflux medication. Patient mentions that she feels very isolated in her room at [facility]. She is at the end of the Step[place] and at night she feels like she is forgotten. She states "they leave the door open and all the little animals start coming in. The bunnies and the other little animals." Patient's family state her "sun downing" and hallucinations are not new.  She reports bilateral lower extremity edema that has been a chronic issue for the patient. It is being managed with Lasix, elevation and leg wraps. Her son reports her weight was up five days ago but  is improved today. She is being weighed daily at her living facility. She reports brisk diuresis with current dose of Lasix. She denies shortness of breath, DOE, orthopnea, or PND. She sleeps on 2 pillows with bed slightly elevated secondary to reflux symptoms and comfort since she cannot roll onto her side. She ambulates with a walker. She fell 3 weeks ago hitting the back of her head secondary  to reaching for something on her bed and slipping.         ROS: All other systems reviewed and are otherwise negative except as noted in History of Present Illness.  Studies Reviewed    EKG Interpretation Date/Time:  Monday November 05 2023 13:51:02 EST Ventricular Rate:  57 PR Interval:    QRS Duration:  74 QT Interval:  420 QTC Calculation: 408 R Axis:   -7  Text Interpretation: Atrial fibrillation with slow ventricular response  Diffuse T-wave abnormalities When compared with ECG of 11-Mar-2023 23:42, PREVIOUS ECG IS PRESENT Confirmed by Carlos Levering [phone]) on 11/05/2023 1:58:36 PM   Risk Assessment/Calculations     CHA2DS2-VASc Score = 5   This indicates a 7.2% annual risk of stroke. The patient's score is based upon: CHF History: 1 HTN History: 1 Diabetes History: 0 Stroke History: 0 Vascular Disease History: 0 Age Score: 2 Gender Score: 1             Physical Exam    VS:  BP 110/66 (BP Location: Right Arm, Patient Position: Sitting)   Pulse (!) 57   Ht 5\' 6"  (1.676 m)   Wt 176 lb 9.6 oz (80.1 kg)   SpO2 97%   BMI 28.50 kg/m  , BMI Body mass index is 28.5 kg/m.  GEN: Well nourished, well developed, in no acute distress. Neck: No JVD or carotid bruits. Cardiac: Irregular rhythm, regular rate. No murmurs. No rubs or gallops.   Respiratory:  Respirations regular and unlabored. Clear to auscultation without rales, wheezing or rhonchi. GI: Soft, nontender, nondistended. Extremities: Radials/DP/PT 2+ and equal bilaterally. No clubbing or cyanosis. Moderate edema bilateral lower extremities with leg wraps in place to mid shin.   Skin: Warm and dry, no rash. Neuro: Strength intact.  Assessment & Plan      Nonobstructive CAD/Chronic Chest Discomfort LHC May 2007 showed diffuse scattered coronary atherosclerosis of LAD and RCA treated medically with Plavix.  Plavix discontinued when patient developed A-fib in 2019.  Patient reports chronic chest  discomfort unchanged for years. Family feels it is related to anxiety and GERD.  -Continue metoprolol, as needed SL NTG.  Not on antiplatelet secondary to Eliquis.  Chronic HFpEF/Bilateral lower extremity edema  Echo March 2019 showed normal LV function, mild RAE, mild to moderate TR.  Patient reports continued lower extremity edema managed with Lasix,leg wraps and intermittent elevation. Family reports she had a short course of increased Lasix at [facility] but it did not improve the edema. She is being weighed daily. Discussed repeating echo but family would like to defer at this time. Patient denies shortness of breath, DOE, orthopnea or PND. Patient with moderate edema bilateral lower extremities. Leg wraps only to mid shin. Breath sounds clear to auscultation.  -Will defer changes to Lasix regimen to PCP at living facility as they are weighing patient daily and monitoring her labs. Patient's family are in agreement with this as they feel it is very stressful for the patient to come to appointments outside the facility. Wrote recommendations for extra dose of Lasix for weight  gain of 3 lb overnight and 5 lb in a week with close monitoring of kidney function and electrolytes. -Recommend wrapping legs to the knee at a minimum or higher if patient can tolerate.  -Continue metoprolol, losartan, hydralazine, Lasix.  Permanent A-fib Onset March 2019.  Patient denies palpitations. Denies spontaneous bleeding concerns.  EKG shows afib 57 bpm.  -Continue metoprolol and Eliquis. -Appropriate Eliquis dose.    Hypertension BP today 110/66. She has some dizziness since hitting her head in a fall 3 weeks ago. No headaches.  -Continue metoprolol, losartan, hydralazine.        Disposition: Patient was previously seeing Dr. Shari Prows in [place]. Patient is now living at [facility] and needs to establish with cardiologist. She will return to see Dr. Okey Dupre in 6 months or sooner as needed.           Signed, Etta Grandchild. Amaal Dimartino, DNP, NP-C

## 2023-11-05 ENCOUNTER — Ambulatory Visit: Payer: Medicare Other | Attending: Student | Admitting: Student

## 2023-11-05 ENCOUNTER — Encounter: Payer: Self-pay | Admitting: Student

## 2023-11-05 VITALS — BP 110/66 | HR 57 | Ht 66.0 in | Wt 176.6 lb

## 2023-11-05 DIAGNOSIS — I4821 Permanent atrial fibrillation: Secondary | ICD-10-CM

## 2023-11-05 DIAGNOSIS — I5032 Chronic diastolic (congestive) heart failure: Secondary | ICD-10-CM

## 2023-11-05 DIAGNOSIS — R0789 Other chest pain: Secondary | ICD-10-CM | POA: Diagnosis not present

## 2023-11-05 DIAGNOSIS — R600 Localized edema: Secondary | ICD-10-CM | POA: Diagnosis not present

## 2023-11-05 DIAGNOSIS — I10 Essential (primary) hypertension: Secondary | ICD-10-CM | POA: Diagnosis not present

## 2023-11-05 NOTE — Patient Instructions (Signed)
Medication Instructions:  The current medical regimen is effective;  continue present plan and medications.  *If you need a refill on your cardiac medications before your next appointment, please call your pharmacy*   Follow-Up: At [facility], you and your health needs are our priority.  As part of our continuing mission to provide you with exceptional heart care, we have created designated Provider Care Teams.  These Care Teams include your primary Cardiologist (physician) and Advanced Practice Providers (APPs -  Physician Assistants and Nurse Practitioners) who all work together to provide you with the care you need, when you need it.  We recommend signing up for the patient portal called "MyChart".  Sign up information is provided on this After Visit Summary.  MyChart is used to connect with patients for Virtual Visits (Telemedicine).  Patients are able to view lab/test results, encounter notes, upcoming appointments, etc.  Non-urgent messages can be sent to your provider as well.   To learn more about what you can do with MyChart, go to [email or web address].    Your next appointment:   6 month(s)  Provider:   Yvonne Kendall, MD  (previous patient of Dr.Pemberton, moved to [facility], needs to establish care)

## 2023-11-12 DIAGNOSIS — S72002D Fracture of unspecified part of neck of left femur, subsequent encounter for closed fracture with routine healing: Secondary | ICD-10-CM | POA: Diagnosis not present

## 2023-11-12 DIAGNOSIS — M6281 Muscle weakness (generalized): Secondary | ICD-10-CM | POA: Diagnosis not present

## 2023-11-12 DIAGNOSIS — Z9181 History of falling: Secondary | ICD-10-CM | POA: Diagnosis not present

## 2023-11-12 DIAGNOSIS — Z741 Need for assistance with personal care: Secondary | ICD-10-CM | POA: Diagnosis not present

## 2023-11-19 DIAGNOSIS — Z741 Need for assistance with personal care: Secondary | ICD-10-CM | POA: Diagnosis not present

## 2023-11-19 DIAGNOSIS — S72002D Fracture of unspecified part of neck of left femur, subsequent encounter for closed fracture with routine healing: Secondary | ICD-10-CM | POA: Diagnosis not present

## 2023-11-19 DIAGNOSIS — M6281 Muscle weakness (generalized): Secondary | ICD-10-CM | POA: Diagnosis not present

## 2023-11-19 DIAGNOSIS — Z9181 History of falling: Secondary | ICD-10-CM | POA: Diagnosis not present

## 2023-11-21 DIAGNOSIS — Z9181 History of falling: Secondary | ICD-10-CM | POA: Diagnosis not present

## 2023-11-21 DIAGNOSIS — Z741 Need for assistance with personal care: Secondary | ICD-10-CM | POA: Diagnosis not present

## 2023-11-21 DIAGNOSIS — M6281 Muscle weakness (generalized): Secondary | ICD-10-CM | POA: Diagnosis not present

## 2023-11-21 DIAGNOSIS — S72002D Fracture of unspecified part of neck of left femur, subsequent encounter for closed fracture with routine healing: Secondary | ICD-10-CM | POA: Diagnosis not present

## 2023-11-22 DIAGNOSIS — Z741 Need for assistance with personal care: Secondary | ICD-10-CM | POA: Diagnosis not present

## 2023-11-22 DIAGNOSIS — M6281 Muscle weakness (generalized): Secondary | ICD-10-CM | POA: Diagnosis not present

## 2023-11-22 DIAGNOSIS — S72002D Fracture of unspecified part of neck of left femur, subsequent encounter for closed fracture with routine healing: Secondary | ICD-10-CM | POA: Diagnosis not present

## 2023-11-22 DIAGNOSIS — Z9181 History of falling: Secondary | ICD-10-CM | POA: Diagnosis not present

## 2023-11-23 DIAGNOSIS — Z741 Need for assistance with personal care: Secondary | ICD-10-CM | POA: Diagnosis not present

## 2023-11-23 DIAGNOSIS — S72002D Fracture of unspecified part of neck of left femur, subsequent encounter for closed fracture with routine healing: Secondary | ICD-10-CM | POA: Diagnosis not present

## 2023-11-23 DIAGNOSIS — M6281 Muscle weakness (generalized): Secondary | ICD-10-CM | POA: Diagnosis not present

## 2023-11-23 DIAGNOSIS — Z9181 History of falling: Secondary | ICD-10-CM | POA: Diagnosis not present

## 2023-11-26 DIAGNOSIS — M6281 Muscle weakness (generalized): Secondary | ICD-10-CM | POA: Diagnosis not present

## 2023-11-26 DIAGNOSIS — Z741 Need for assistance with personal care: Secondary | ICD-10-CM | POA: Diagnosis not present

## 2023-11-26 DIAGNOSIS — Z9181 History of falling: Secondary | ICD-10-CM | POA: Diagnosis not present

## 2023-11-26 DIAGNOSIS — S72002D Fracture of unspecified part of neck of left femur, subsequent encounter for closed fracture with routine healing: Secondary | ICD-10-CM | POA: Diagnosis not present

## 2023-11-27 DIAGNOSIS — M6281 Muscle weakness (generalized): Secondary | ICD-10-CM | POA: Diagnosis not present

## 2023-11-27 DIAGNOSIS — Z9181 History of falling: Secondary | ICD-10-CM | POA: Diagnosis not present

## 2023-11-27 DIAGNOSIS — Z741 Need for assistance with personal care: Secondary | ICD-10-CM | POA: Diagnosis not present

## 2023-11-27 DIAGNOSIS — S72002D Fracture of unspecified part of neck of left femur, subsequent encounter for closed fracture with routine healing: Secondary | ICD-10-CM | POA: Diagnosis not present

## 2023-11-29 DIAGNOSIS — M6281 Muscle weakness (generalized): Secondary | ICD-10-CM | POA: Diagnosis not present

## 2023-11-29 DIAGNOSIS — Z9181 History of falling: Secondary | ICD-10-CM | POA: Diagnosis not present

## 2023-11-29 DIAGNOSIS — S72002D Fracture of unspecified part of neck of left femur, subsequent encounter for closed fracture with routine healing: Secondary | ICD-10-CM | POA: Diagnosis not present

## 2023-11-29 DIAGNOSIS — Z741 Need for assistance with personal care: Secondary | ICD-10-CM | POA: Diagnosis not present

## 2023-12-04 DIAGNOSIS — S72002D Fracture of unspecified part of neck of left femur, subsequent encounter for closed fracture with routine healing: Secondary | ICD-10-CM | POA: Diagnosis not present

## 2023-12-04 DIAGNOSIS — Z9181 History of falling: Secondary | ICD-10-CM | POA: Diagnosis not present

## 2023-12-04 DIAGNOSIS — M6281 Muscle weakness (generalized): Secondary | ICD-10-CM | POA: Diagnosis not present

## 2023-12-04 DIAGNOSIS — Z741 Need for assistance with personal care: Secondary | ICD-10-CM | POA: Diagnosis not present

## 2023-12-06 DIAGNOSIS — M6281 Muscle weakness (generalized): Secondary | ICD-10-CM | POA: Diagnosis not present

## 2023-12-06 DIAGNOSIS — Z741 Need for assistance with personal care: Secondary | ICD-10-CM | POA: Diagnosis not present

## 2023-12-06 DIAGNOSIS — S72002D Fracture of unspecified part of neck of left femur, subsequent encounter for closed fracture with routine healing: Secondary | ICD-10-CM | POA: Diagnosis not present

## 2023-12-06 DIAGNOSIS — Z9181 History of falling: Secondary | ICD-10-CM | POA: Diagnosis not present

## 2023-12-07 DIAGNOSIS — Z9181 History of falling: Secondary | ICD-10-CM | POA: Diagnosis not present

## 2023-12-07 DIAGNOSIS — S72002D Fracture of unspecified part of neck of left femur, subsequent encounter for closed fracture with routine healing: Secondary | ICD-10-CM | POA: Diagnosis not present

## 2023-12-07 DIAGNOSIS — M6281 Muscle weakness (generalized): Secondary | ICD-10-CM | POA: Diagnosis not present

## 2023-12-07 DIAGNOSIS — Z741 Need for assistance with personal care: Secondary | ICD-10-CM | POA: Diagnosis not present

## 2023-12-10 DIAGNOSIS — M6281 Muscle weakness (generalized): Secondary | ICD-10-CM | POA: Diagnosis not present

## 2023-12-10 DIAGNOSIS — Z9181 History of falling: Secondary | ICD-10-CM | POA: Diagnosis not present

## 2023-12-10 DIAGNOSIS — S72002D Fracture of unspecified part of neck of left femur, subsequent encounter for closed fracture with routine healing: Secondary | ICD-10-CM | POA: Diagnosis not present

## 2023-12-10 DIAGNOSIS — Z741 Need for assistance with personal care: Secondary | ICD-10-CM | POA: Diagnosis not present

## 2023-12-11 ENCOUNTER — Emergency Department: Payer: Medicare Other

## 2023-12-11 ENCOUNTER — Inpatient Hospital Stay: Payer: Medicare Other

## 2023-12-11 ENCOUNTER — Inpatient Hospital Stay
Admission: EM | Admit: 2023-12-11 | Discharge: 2023-12-14 | DRG: 065 | Disposition: A | Discharge status: Skilled Nursing Facility | Payer: Medicare Other | Attending: Internal Medicine | Admitting: Internal Medicine

## 2023-12-11 ENCOUNTER — Other Ambulatory Visit: Payer: Self-pay

## 2023-12-11 DIAGNOSIS — H9190 Unspecified hearing loss, unspecified ear: Secondary | ICD-10-CM | POA: Diagnosis not present

## 2023-12-11 DIAGNOSIS — I639 Cerebral infarction, unspecified: Secondary | ICD-10-CM | POA: Diagnosis not present

## 2023-12-11 DIAGNOSIS — I130 Hypertensive heart and chronic kidney disease with heart failure and stage 1 through stage 4 chronic kidney disease, or unspecified chronic kidney disease: Secondary | ICD-10-CM | POA: Diagnosis not present

## 2023-12-11 DIAGNOSIS — Z9181 History of falling: Secondary | ICD-10-CM | POA: Diagnosis not present

## 2023-12-11 DIAGNOSIS — N1831 Chronic kidney disease, stage 3a: Secondary | ICD-10-CM | POA: Diagnosis not present

## 2023-12-11 DIAGNOSIS — I6523 Occlusion and stenosis of bilateral carotid arteries: Secondary | ICD-10-CM | POA: Diagnosis not present

## 2023-12-11 DIAGNOSIS — I2510 Atherosclerotic heart disease of native coronary artery without angina pectoris: Secondary | ICD-10-CM | POA: Diagnosis present

## 2023-12-11 DIAGNOSIS — M6281 Muscle weakness (generalized): Secondary | ICD-10-CM | POA: Diagnosis not present

## 2023-12-11 DIAGNOSIS — K529 Noninfective gastroenteritis and colitis, unspecified: Secondary | ICD-10-CM | POA: Diagnosis not present

## 2023-12-11 DIAGNOSIS — R29710 NIHSS score 10: Secondary | ICD-10-CM | POA: Diagnosis not present

## 2023-12-11 DIAGNOSIS — I672 Cerebral atherosclerosis: Secondary | ICD-10-CM | POA: Diagnosis not present

## 2023-12-11 DIAGNOSIS — G4700 Insomnia, unspecified: Secondary | ICD-10-CM | POA: Diagnosis not present

## 2023-12-11 DIAGNOSIS — Z66 Do not resuscitate: Secondary | ICD-10-CM | POA: Diagnosis present

## 2023-12-11 DIAGNOSIS — I6389 Other cerebral infarction: Secondary | ICD-10-CM | POA: Diagnosis not present

## 2023-12-11 DIAGNOSIS — Z23 Encounter for immunization: Secondary | ICD-10-CM

## 2023-12-11 DIAGNOSIS — R202 Paresthesia of skin: Principal | ICD-10-CM

## 2023-12-11 DIAGNOSIS — I872 Venous insufficiency (chronic) (peripheral): Secondary | ICD-10-CM | POA: Diagnosis not present

## 2023-12-11 DIAGNOSIS — Z8249 Family history of ischemic heart disease and other diseases of the circulatory system: Secondary | ICD-10-CM | POA: Diagnosis not present

## 2023-12-11 DIAGNOSIS — Z7901 Long term (current) use of anticoagulants: Secondary | ICD-10-CM

## 2023-12-11 DIAGNOSIS — E669 Obesity, unspecified: Secondary | ICD-10-CM | POA: Diagnosis present

## 2023-12-11 DIAGNOSIS — F419 Anxiety disorder, unspecified: Secondary | ICD-10-CM | POA: Diagnosis present

## 2023-12-11 DIAGNOSIS — M1990 Unspecified osteoarthritis, unspecified site: Secondary | ICD-10-CM | POA: Diagnosis present

## 2023-12-11 DIAGNOSIS — I5032 Chronic diastolic (congestive) heart failure: Secondary | ICD-10-CM | POA: Diagnosis not present

## 2023-12-11 DIAGNOSIS — Z90710 Acquired absence of both cervix and uterus: Secondary | ICD-10-CM

## 2023-12-11 DIAGNOSIS — I69398 Other sequelae of cerebral infarction: Secondary | ICD-10-CM | POA: Diagnosis not present

## 2023-12-11 DIAGNOSIS — Z79899 Other long term (current) drug therapy: Secondary | ICD-10-CM | POA: Diagnosis not present

## 2023-12-11 DIAGNOSIS — E876 Hypokalemia: Secondary | ICD-10-CM

## 2023-12-11 DIAGNOSIS — R2689 Other abnormalities of gait and mobility: Secondary | ICD-10-CM | POA: Diagnosis not present

## 2023-12-11 DIAGNOSIS — I708 Atherosclerosis of other arteries: Secondary | ICD-10-CM | POA: Diagnosis not present

## 2023-12-11 DIAGNOSIS — R278 Other lack of coordination: Secondary | ICD-10-CM | POA: Diagnosis not present

## 2023-12-11 DIAGNOSIS — G8194 Hemiplegia, unspecified affecting left nondominant side: Secondary | ICD-10-CM | POA: Diagnosis present

## 2023-12-11 DIAGNOSIS — E1122 Type 2 diabetes mellitus with diabetic chronic kidney disease: Secondary | ICD-10-CM | POA: Diagnosis present

## 2023-12-11 DIAGNOSIS — R29898 Other symptoms and signs involving the musculoskeletal system: Secondary | ICD-10-CM

## 2023-12-11 DIAGNOSIS — I6782 Cerebral ischemia: Secondary | ICD-10-CM | POA: Diagnosis not present

## 2023-12-11 DIAGNOSIS — I6381 Other cerebral infarction due to occlusion or stenosis of small artery: Secondary | ICD-10-CM | POA: Diagnosis present

## 2023-12-11 DIAGNOSIS — E1142 Type 2 diabetes mellitus with diabetic polyneuropathy: Secondary | ICD-10-CM | POA: Diagnosis present

## 2023-12-11 DIAGNOSIS — Z7401 Bed confinement status: Secondary | ICD-10-CM | POA: Diagnosis not present

## 2023-12-11 DIAGNOSIS — I6503 Occlusion and stenosis of bilateral vertebral arteries: Secondary | ICD-10-CM | POA: Diagnosis not present

## 2023-12-11 DIAGNOSIS — I160 Hypertensive urgency: Secondary | ICD-10-CM

## 2023-12-11 DIAGNOSIS — E785 Hyperlipidemia, unspecified: Secondary | ICD-10-CM | POA: Diagnosis not present

## 2023-12-11 DIAGNOSIS — Z87442 Personal history of urinary calculi: Secondary | ICD-10-CM

## 2023-12-11 DIAGNOSIS — Z885 Allergy status to narcotic agent status: Secondary | ICD-10-CM

## 2023-12-11 DIAGNOSIS — I4821 Permanent atrial fibrillation: Secondary | ICD-10-CM | POA: Diagnosis not present

## 2023-12-11 DIAGNOSIS — Z823 Family history of stroke: Secondary | ICD-10-CM

## 2023-12-11 DIAGNOSIS — Z471 Aftercare following joint replacement surgery: Secondary | ICD-10-CM | POA: Diagnosis not present

## 2023-12-11 DIAGNOSIS — I10 Essential (primary) hypertension: Secondary | ICD-10-CM | POA: Diagnosis not present

## 2023-12-11 LAB — APTT: aPTT: 38 s — ABNORMAL HIGH (ref 24–36)

## 2023-12-11 LAB — COMPREHENSIVE METABOLIC PANEL
ALT: 10 U/L (ref 0–44)
AST: 22 U/L (ref 15–41)
Albumin: 3.4 g/dL — ABNORMAL LOW (ref 3.5–5.0)
Alkaline Phosphatase: 74 U/L (ref 38–126)
Anion gap: 12 (ref 5–15)
BUN: 14 mg/dL (ref 8–23)
CO2: 26 mmol/L (ref 22–32)
Calcium: 9.1 mg/dL (ref 8.9–10.3)
Chloride: 98 mmol/L (ref 98–111)
Creatinine, Ser: 0.73 mg/dL (ref 0.44–1.00)
GFR, Estimated: >60 mL/min (ref >60)
Glucose, Bld: 139 mg/dL — ABNORMAL HIGH (ref 70–99)
Potassium: 3.2 mmol/L — ABNORMAL LOW (ref 3.5–5.1)
Sodium: 136 mmol/L (ref 135–145)
Total Bilirubin: 1 mg/dL (ref <1.2)
Total Protein: 7.1 g/dL (ref 6.5–8.1)

## 2023-12-11 LAB — CBC
HCT: 43.2 % (ref 36.0–46.0)
Hemoglobin: 14.2 g/dL (ref 12.0–15.0)
MCH: 30.5 pg (ref 26.0–34.0)
MCHC: 32.9 g/dL (ref 30.0–36.0)
MCV: 92.9 fL (ref 80.0–100.0)
Platelets: 347 10*3/uL (ref 150–400)
RBC: 4.65 MIL/uL (ref 3.87–5.11)
RDW: 14.5 % (ref 11.5–15.5)
WBC: 9.4 10*3/uL (ref 4.0–10.5)
nRBC: 0 % (ref 0.0–0.2)

## 2023-12-11 LAB — DIFFERENTIAL
Abs Immature Granulocytes: 0.04 10*3/uL (ref 0.00–0.07)
Basophils Absolute: 0.1 10*3/uL (ref 0.0–0.1)
Basophils Relative: 1 %
Eosinophils Absolute: 0.1 10*3/uL (ref 0.0–0.5)
Eosinophils Relative: 1 %
Immature Granulocytes: 0 %
Lymphocytes Relative: 18 %
Lymphs Abs: 1.7 10*3/uL (ref 0.7–4.0)
Monocytes Absolute: 0.8 10*3/uL (ref 0.1–1.0)
Monocytes Relative: 9 %
Neutro Abs: 6.7 10*3/uL (ref 1.7–7.7)
Neutrophils Relative %: 71 %

## 2023-12-11 LAB — PROTIME-INR
INR: 1.5 — ABNORMAL HIGH (ref 0.8–1.2)
Prothrombin Time: 18.2 s — ABNORMAL HIGH (ref 11.4–15.2)

## 2023-12-11 LAB — ETHANOL: Alcohol, Ethyl (B): <10 mg/dL (ref <10)

## 2023-12-11 LAB — TROPONIN I (HIGH SENSITIVITY): Troponin I (High Sensitivity): 11 ng/L (ref <18)

## 2023-12-11 MED ORDER — ACETAMINOPHEN 325 MG PO TABS
650.0000 mg | ORAL_TABLET | ORAL | Status: DC | PRN
  Administered 2023-12-12 – 2023-12-14 (×5): 650 mg via ORAL
  Filled 2023-12-11 (×5): qty 2

## 2023-12-11 MED ORDER — SODIUM CHLORIDE 0.9% FLUSH: 3.0000 mL | Freq: Once | INTRAVENOUS | Status: DC

## 2023-12-11 MED ORDER — ACETAMINOPHEN 650 MG RE SUPP: 650.0000 mg | RECTAL | Status: DC | PRN

## 2023-12-11 MED ORDER — STROKE: EARLY STAGES OF RECOVERY BOOK: Freq: Once | Status: AC

## 2023-12-11 MED ORDER — IOHEXOL 350 MG/ML SOLN
75.0000 mL | Freq: Once | INTRAVENOUS | Status: AC | PRN
  Administered 2023-12-11: 75 mL via INTRAVENOUS

## 2023-12-11 MED ORDER — APIXABAN 5 MG PO TABS
5.0000 mg | ORAL_TABLET | Freq: Two times a day (BID) | ORAL | Status: DC
  Administered 2023-12-11 – 2023-12-14 (×6): 5 mg via ORAL
  Filled 2023-12-11 (×6): qty 1

## 2023-12-11 MED ORDER — ASPIRIN 325 MG PO TABS
325.0000 mg | ORAL_TABLET | Freq: Once | ORAL | Status: AC
  Administered 2023-12-11: 325 mg via ORAL
  Filled 2023-12-11: qty 1

## 2023-12-11 MED ORDER — SENNOSIDES-DOCUSATE SODIUM 8.6-50 MG PO TABS: 1.0000 | ORAL_TABLET | Freq: Every evening | ORAL | Status: DC | PRN

## 2023-12-11 MED ORDER — ALPRAZOLAM 0.5 MG PO TABS
0.5000 mg | ORAL_TABLET | Freq: Every evening | ORAL | Status: DC | PRN
  Administered 2023-12-11 – 2023-12-13 (×3): 0.5 mg via ORAL
  Filled 2023-12-11 (×3): qty 1

## 2023-12-11 MED ORDER — ACETAMINOPHEN 160 MG/5ML PO SOLN: 650.0000 mg | ORAL | Status: DC | PRN

## 2023-12-11 NOTE — ED Triage Notes (Addendum)
Pt to ED via [facility] from [facility]. Pt reports woke up this morning at 0300 and couldn't open left hand and it was tingling and left arm numbness. EMS reports facility states family visiting at 10am and pt was "close" to baseline. EMS states 9 months ago pt fell with left hip surgery and resides at [facility]. Pt is on Eliquis. Pt reports still having numbness to left arm. Pt moving all extremities. Pt has noted edema and redness to bilateral legs and feet  EMS VS:  BP 210/104 HR 70

## 2023-12-11 NOTE — Assessment & Plan Note (Addendum)
Continue Eliquis Holding metoprolol for permissive hypertension

## 2023-12-11 NOTE — ED Provider Notes (Addendum)
[facility] Provider Note    Event Date/Time   First MD Initiated Contact with Patient 12/11/23 2012     (approximate)   History   Numbness (Left arm - LKW 0300)   HPI  Tanya Ho is a 89 y.o. female   Past medical history of CAD, hypertension hyperlipidemia, osteoarthritis, AF on Eliquis, prior left-sided hip surgery, presents emerged department with left arm weakness and numbness.    Last known normal was 10 PM yesterday when she went to bed.    She is otherwise been in her regular state of health except for a bout of GI illness yesterday with nausea vomiting diarrhea that has now resolved.  She is unable to lift her left arm against gravity.  She has numbness and tingling to the left arm.    She denies any chest pain.    Left leg sensation is intact.  She is unable to lift it but unclear if this is acute or chronic given her left hip surgery in the past.  Independent Historian contributed to assessment above: She is here with both her daughter and great granddaughter who give collateral information and past medical history as above, stating that she is able to use both upper extremities with full active range of motion and is usually ambulatory without assistance and able to do activities of daily living on her own.   Physical Exam   Triage Vital Signs: ED Triage Vitals [12/11/23 1559]  Encounter Vitals Group     BP (!) 188/81     Systolic BP Percentile      Diastolic BP Percentile      Pulse Rate 68     Resp 20     Temp 98 F (36.7 C)     Temp Source Oral     SpO2 95 %     Weight      Height      Head Circumference      Peak Flow      Pain Score 0     Pain Loc      Pain Education      Exclude from Growth Chart     Most recent vital signs: Vitals:   12/11/23 1559 12/11/23 1847  BP: (!) 188/81 (!) 174/61  Pulse: 68 65  Resp: 20 19  Temp: 98 F (36.7 C) 98 F (36.7 C)  SpO2: 95% 95%    General: Awake, no distress.   CV:  Good peripheral perfusion.  Resp:  Normal effort.  Abd:  No distention.  Other:  Hard of hearing.  Well-appearing.  Hypertensive otherwise vital signs normal.  No facial asymmetry or dysarthria.  She is unable to lift the left arm against gravity.  There is no obvious trauma and when I passively range elicits no pain.  She has a temp tingling and numbness sensation throughout the entirety of the left upper extremity.  Her left lower extremity sensation is intact however she is unable to lift against gravity but she states that this is chronic given her left hip fracture status post repair.   ED Results / Procedures / Treatments   Labs (all labs ordered are listed, but only abnormal results are displayed) Labs Reviewed  PROTIME-INR - Abnormal; Notable for the following components:      Result Value   Prothrombin Time 18.2 (*)    INR 1.5 (*)    All other components within normal limits  APTT - Abnormal; Notable for the  following components:   aPTT 38 (*)    All other components within normal limits  COMPREHENSIVE METABOLIC PANEL - Abnormal; Notable for the following components:   Potassium 3.2 (*)    Glucose, Bld 139 (*)    Albumin 3.4 (*)    All other components within normal limits  CBC  DIFFERENTIAL  ETHANOL  LIPID PANEL  CBG MONITORING, ED  TROPONIN I (HIGH SENSITIVITY)     I ordered and reviewed the above labs they are notable for potassium slightly low at 3.2, otherwise cell counts normal.  EKG  ED ECG REPORT I, Pilar Jarvis, the attending physician, personally viewed and interpreted this ECG.   Date: 12/11/2023  EKG Time: 1605  Rate: 65  Rhythm: AF  Axis: nl  Intervals:none  ST&T Change: no stemi    RADIOLOGY I independently reviewed and interpreted CT scan of the head see no obvious bleeding or midline shift I also reviewed radiologist's formal read.   PROCEDURES:  Critical Care performed: Yes, see critical care procedure note(s)  .Critical  Care  Performed by: Pilar Jarvis, MD Authorized by: Pilar Jarvis, MD   Critical care provider statement:    Critical care time (minutes):  30   Critical care was time spent personally by me on the following activities:  Development of treatment plan with patient or surrogate, discussions with consultants, evaluation of patient's response to treatment, examination of patient, ordering and review of laboratory studies, ordering and review of radiographic studies, ordering and performing treatments and interventions, pulse oximetry, re-evaluation of patient's condition and review of old charts    MEDICATIONS ORDERED IN ED: Medications  ALPRAZolam (XANAX) tablet 0.5 mg (has no administration in time range)  apixaban (ELIQUIS) tablet 5 mg (has no administration in time range)   stroke: early stages of recovery book (has no administration in time range)  acetaminophen (TYLENOL) tablet 650 mg (has no administration in time range)    Or  acetaminophen (TYLENOL) 160 MG/5ML solution 650 mg (has no administration in time range)    Or  acetaminophen (TYLENOL) suppository 650 mg (has no administration in time range)  senna-docusate (Senokot-S) tablet 1 tablet (has no administration in time range)    External physician / consultants:  I spoke with hospital medicine for admission and regarding care plan for this patient.   IMPRESSION / MDM / ASSESSMENT AND PLAN / ED COURSE  I reviewed the triage vital signs and the nursing notes.                                Patient's presentation is most consistent with acute presentation with potential threat to life or bodily function.  Differential diagnosis includes, but is not limited to, CVA, head bleed, peripheral neuropathy, considered but less likely ACS or dissection   The patient is on the cardiac monitor to evaluate for evidence of arrhythmia and/or significant heart rate changes.  MDM:    This patient with acute neurologic deficits including  left-sided arm weakness and numbness questionable left-sided leg weakness as well.  Beyond the thrombolytic window as last known normal was 10 PM when she went to sleep, and she is on blood thinner.  CT head negative for bleed.  Added on a CT angiogram head and neck as well as MRI and admission for stroke workup.  I received a call from radiology noting an acute stroke on MRI, left flow-void on vertebral, recommending CT angiogram  which is ordered and pending.  I notified hospitalist who is admitting the patient about these findings.      FINAL CLINICAL IMPRESSION(S) / ED DIAGNOSES   Final diagnoses:  Paresthesia  Left arm weakness  Cerebrovascular accident (CVA), unspecified mechanism (HCC)     Rx / DC Orders   ED Discharge Orders     None        Note:  This document was prepared using Dragon voice recognition software and may include unintentional dictation errors.,d   Pilar Jarvis, MD 12/11/23 2103    Pilar Jarvis, MD 12/11/23 2215

## 2023-12-11 NOTE — H&P (Incomplete)
History and Physical    Patient: Tanya Ho WUJ:[id number] DOB: 06-24-1926 DOA: 12/11/2023 DOS: the patient was seen and examined on 12/11/2023 PCP: [name], MD  Patient coming from: Home  Chief Complaint:  Chief Complaint  Patient presents with   Numbness    Left arm - LKW 0300    HPI: Tanya Ho is a 89 y.o. female with medical history significant for hypertension, nonobstructive CAD, DM, CKD llla, chronic diastolic CHF with chronic lower extremity edema (last seen by cardiology 10/2023), permanent atrial fibrillation on Eliquis, history of falls, hip fracture  in March 2024 s/p repair and ambulant with walker at baseline, who presents via EMS with weakness and numbness left arm..  Patient awoke at 3 AM on the day of arrival with difficulty opening her hand and was in her usual state of health when she went to bed at 10 PM..  Patient does report a 1 day GI illness the day prior with vomiting and diarrhea that has resolved.  She feels like she is unable to lift her left arm.  Also has difficulty lifting her left leg but this is her baseline since her hip repair.  Denies numbness and tingling of the left arm.  Denies facial numbness tingling or weakness,.  No slurred speech ED course and data review: BP elevated at 188/81 with otherwise normal vitals Notable findings on workup include the following: Normal CBC with differential CMP notable for low potassium of 3.2 Troponin 11, EtOH less than 10 and INR 1.5 EKG, personally viewed and interpreted showing A-fib at 65 with no ischemic ST-T wave changes CT head nonacute.  Showing age-related atrophy and chronic small vessel ischemic changes as well as unchanged extensive calcification in the carotid siphons and middle cerebral arteries. CTA head and neck and MRI brain ordered and results pending Hospitalist consulted for admission for stroke workup.     Past Medical History:  Diagnosis Date   Back pain    Bruises easily     Coronary artery disease    NONOBSTRUCTIVE   Fatigue    Hyperlipidemia    Hypertension    Knee pain    OA (osteoarthritis)    Obesity    Palpitations    SOB (shortness of breath)    Past Surgical History:  Procedure Laterality Date   BREAST BIOPSY     LEFT BREAST   CARDIAC CATHETERIZATION  05/11/2006   OVERALL CARDIAC SIZE AND SILHOUETTE ARE NORMAL. EF ESTIMATED 60%   INTRAMEDULLARY (IM) NAIL INTERTROCHANTERIC Left 03/12/2023   Procedure: INTRAMEDULLARY (IM) NAIL INTERTROCHANTERIC;  Surgeon: Roby Lofts, MD;  Location: [facility] OR;  Service: Orthopedics;  Laterality: Left;   KIDNEY STONES  1982   KNEE ARTHROSCOPY  09/22/2008   VAGINAL HYSTERECTOMY     Social History:  reports that she has never smoked. She has never used smokeless tobacco. She reports that she does not drink alcohol and does not use drugs.  Allergies  Allergen Reactions   Codeine Other (See Comments)    Unknown reaction   Demerol [Meperidine Hcl] Other (See Comments)    Unknown reaction   Morphine And Codeine Other (See Comments)    Unknown reaction    Family History  Problem Relation Age of Onset   Heart disease Mother    Stroke Mother    Heart attack Father     Prior to Admission medications   Medication Sig Start Date End Date Taking? Authorizing Provider  acetaminophen (TYLENOL) 325 MG tablet Take  650 mg by mouth every 6 (six) hours as needed.    [provider]  acetaminophen (TYLENOL) 325 MG tablet Take 650 mg by mouth every 4 (four) hours as needed. Patient not taking: Reported on 11/05/2023    [provider]  ALPRAZolam Prudy Feeler) 1 MG tablet Take 1 tablet (1 mg total) by mouth at bedtime. 10/04/23   Sharon Seller, NP  alum & mag hydroxide-simeth (MAALOX PLUS) 400-400-40 MG/5ML suspension Take 5 mLs by mouth every 4 (four) hours as needed for indigestion.    [provider]  amoxicillin-clavulanate (AUGMENTIN) 875-125 MG tablet Take 1 tablet by mouth 2 (two) times  daily. Patient not taking: Reported on 11/05/2023    [provider]  apixaban (ELIQUIS) 5 MG TABS tablet Take 1 tablet (5 mg total) by mouth 2 (two) times daily. 12/19/22   [name], MD  bisacodyl (DULCOLAX) 10 MG suppository Place 10 mg rectally daily. As needed for constipation    [provider]  Cholecalciferol (VITAMIN D-3) 25 MCG (1000 UT) CAPS One by mouth daily.    [provider]  estradiol (ESTRACE) 0.1 MG/GM vaginal cream Place 1 Applicatorful vaginally. At bedtime every Tuesday and Friday    [provider]  famotidine (PEPCID) 10 MG tablet Take 10 mg by mouth at bedtime.    [provider]  furosemide (LASIX) 20 MG tablet TAKE 1 TABLET EVERY DAY Patient taking differently: Take 40 mg by mouth daily. 10/24/22   [name], MD  hydrALAZINE (APRESOLINE) 25 MG tablet Take 25 mg by mouth daily. Patient not taking: Reported on 10/30/2023    [provider]  hydroxypropyl methylcellulose / hypromellose (ISOPTO TEARS / GONIOVISC) 2.5 % ophthalmic solution Place 1 drop into both eyes every 4 (four) hours as needed for dry eyes.    [provider]  losartan (COZAAR) 50 MG tablet Take 50 mg by mouth daily.    [provider]  metoprolol tartrate (LOPRESSOR) 100 MG tablet Take 100 mg by mouth 2 (two) times daily.    [provider]  metoprolol tartrate (LOPRESSOR) 50 MG tablet Take 50 mg by mouth as directed. Every 24 hours as needed for heart palpitations    [provider]  nitroGLYCERIN (NITROSTAT) 0.4 MG SL tablet Place 1 tablet (0.4 mg total) under the tongue every 5 (five) minutes as needed for chest pain. 09/08/20   Rosalio Macadamia, NP  ondansetron (ZOFRAN) 4 MG tablet Take 4 mg by mouth every 8 (eight) hours as needed for nausea or vomiting.    [provider]  polyethylene glycol (MIRALAX / GLYCOLAX) 17 g packet Take 17 g by mouth daily. And every 24 hours as needed for  constipation    [provider]  traMADol (ULTRAM) 50 MG tablet Take 1 tablet (50 mg total) by mouth every 6 (six) hours as needed. Give 2 tablets by mouth every 6 hours as needed for severe pain. 10/25/23   Sharon Seller, NP  Zinc Oxide (TRIPLE PASTE) 12.8 % ointment Apply 1 Application topically. To peri area and skin folds topically twice daily    [provider]    Physical Exam: Vitals:   12/11/23 1559 12/11/23 1847  BP: (!) 188/81 (!) 174/61  Pulse: 68 65  Resp: 20 19  Temp: 98 F (36.7 C) 98 F (36.7 C)  TempSrc: Oral   SpO2: 95% 95%   Physical Exam  Labs on Admission: I have personally reviewed following labs  and imaging studies  CBC: Recent Labs  Lab 12/11/23 1601  WBC 9.4  NEUTROABS 6.7  HGB 14.2  HCT 43.2  MCV 92.9  PLT 347   Basic Metabolic Panel: Recent Labs  Lab 12/11/23 1601  NA 136  K 3.2*  CL 98  CO2 26  GLUCOSE 139*  BUN 14  CREATININE 0.73  CALCIUM 9.1   GFR: CrCl cannot be calculated (Unknown ideal weight.). Liver Function Tests: Recent Labs  Lab 12/11/23 1601  AST 22  ALT 10  ALKPHOS 74  BILITOT 1.0  PROT 7.1  ALBUMIN 3.4*   No results for input(s): "LIPASE", "AMYLASE" in the last 168 hours. No results for input(s): "AMMONIA" in the last 168 hours. Coagulation Profile: Recent Labs  Lab 12/11/23 1601  INR 1.5*   Cardiac Enzymes: No results for input(s): "CKTOTAL", "CKMB", "CKMBINDEX", "TROPONINI" in the last 168 hours. BNP (last 3 results) No results for input(s): "PROBNP" in the last 8760 hours. HbA1C: No results for input(s): "HGBA1C" in the last 72 hours. CBG: No results for input(s): "GLUCAP" in the last 168 hours. Lipid Profile: No results for input(s): "CHOL", "HDL", "LDLCALC", "TRIG", "CHOLHDL", "LDLDIRECT" in the last 72 hours. Thyroid Function Tests: No results for input(s): "TSH", "T4TOTAL", "FREET4", "T3FREE", "THYROIDAB" in the last 72 hours. Anemia Panel: No results for input(s):  "VITAMINB12", "FOLATE", "FERRITIN", "TIBC", "IRON", "RETICCTPCT" in the last 72 hours. Urine analysis:    Component Value Date/Time   COLORURINE YELLOW 03/15/2023 0015   APPEARANCEUR Hazy (A) 05/02/2023 1630   LABSPEC 1.018 03/15/2023 0015   PHURINE 5.0 03/15/2023 0015   GLUCOSEU Negative 05/02/2023 1630   HGBUR SMALL (A) 03/15/2023 0015   BILIRUBINUR Negative 05/02/2023 1630   KETONESUR NEGATIVE 03/15/2023 0015   PROTEINUR Negative 05/02/2023 1630   PROTEINUR NEGATIVE 03/15/2023 0015   NITRITE Negative 05/02/2023 1630   NITRITE NEGATIVE 03/15/2023 0015   LEUKOCYTESUR 2+ (A) 05/02/2023 1630   LEUKOCYTESUR TRACE (A) 03/15/2023 0015    Radiological Exams on Admission: CT HEAD WO CONTRAST Result Date: 12/11/2023 CLINICAL DATA:  Neuro deficit, acute, stroke suspected. Tingling and weakness of the left arm and hand. EXAM: CT HEAD WITHOUT CONTRAST TECHNIQUE: Contiguous axial images were obtained from the base of the skull through the vertex without intravenous contrast. RADIATION DOSE REDUCTION: This exam was performed according to the departmental dose-optimization program which includes automated exposure control, adjustment of the mA and/or kV according to patient size and/or use of iterative reconstruction technique. COMPARISON:  03/12/2023 FINDINGS: Brain: No focal abnormality affects the brainstem or cerebellum. Cerebral hemispheres show generalized age related atrophy with chronic small-vessel ischemic changes of the white matter, thalami and basal ganglia. No sign of acute infarction, mass lesion, hemorrhage, hydrocephalus or extra-axial collection. Vascular: Extensive calcification associated with the carotid siphons and middle cerebral arteries. Similar to the prior study. Skull: Normal Sinuses/Orbits: Clear/normal Other: None IMPRESSION: No acute CT finding. Age related atrophy with chronic small-vessel ischemic changes of the white matter, thalami and basal ganglia. Extensive  calcification associated with the carotid siphons and middle cerebral arteries, unchanged from the prior exam. Electronically Signed   By: Paulina Fusi M.D.   On: 12/11/2023 16:20     Data Reviewed: Relevant notes from primary care and specialist visits, past discharge summaries as available in EHR, including Care Everywhere. Prior diagnostic testing as pertinent to current admission diagnoses Updated medications and problem lists for reconciliation ED course, including vitals, labs, imaging, treatment and response to treatment Triage notes, nursing and pharmacy  notes and ED provider's notes Notable results as noted in HPI   Assessment and Plan: * Acute CVA (cerebrovascular accident) (HCC) Chronic anticoagulation Permissive hypertension for first 24-48 hrs post stroke onset: Prn Labetalol IV or Vasotec IV If BP greater than 220/120  Statins for LDL goal less than 70 Continue Eliquis. Neuro consult for antiplatelets given increased bleeding risk. MRI and CTA head and neck. Continuous cardiac monitoring and echo Avoid dextrose containing fluids, Maintain euglycemia, euthermia Neuro checks q4 hrs x 24 hrs and then per shift Head of bed 30 degrees Physical therapy/Occupational therapy/Speech therapy if failed dysphagia screen Neurology consult to follow   Hypertensive urgency BP elevated at 188/81 on arrival Holding antihypertensives for permissive hypertension  Personal history of fall Ambulates with walker at baseline PT OT eval  Chronic heart failure with preserved ejection fraction (HFpEF) (HCC) Chronic lower extremity edema Clinically euvolemic, though with chronic lower extremity edema Hold Lasix for permissive hypertension  Permanent atrial fibrillation (HCC) Continue Eliquis  Holding metoprolol for permissive hypertension  Coronary artery disease Followed by cardiology, last seen in November Holding metoprolol and losartan for permissive hypertension Continue  apixaban and nitroglycerin Statin not seen on med list     DVT prophylaxis: Apixaban  Consults: neurology, Dr Thad Ranger  Advance Care Planning:   Code Status: Prior   Family Communication: none***  Disposition Plan: Back to previous home environment  Severity of Illness: The appropriate patient status for this patient is INPATIENT. Inpatient status is judged to be reasonable and necessary in order to provide the required intensity of service to ensure the patient's safety. The patient's presenting symptoms, physical exam findings, and initial radiographic and laboratory data in the context of their chronic comorbidities is felt to place them at high risk for further clinical deterioration. Furthermore, it is not anticipated that the patient will be medically stable for discharge from the hospital within 2 midnights of admission.   * I certify that at the point of admission it is my clinical judgment that the patient will require inpatient hospital care spanning beyond 2 midnights from the point of admission due to high intensity of service, high risk for further deterioration and high frequency of surveillance required.*  Author: Andris Baumann, MD 12/11/2023 9:43 PM  For on call review [email or web address].

## 2023-12-11 NOTE — Assessment & Plan Note (Addendum)
Chronic anticoagulation Permissive hypertension for first 24-48 hrs post stroke onset: Prn Labetalol IV or Vasotec IV If BP greater than 220/120  Statins for LDL goal less than 70 Continue Eliquis. Neuro consult for antiplatelets given increased bleeding risk. MRI and CTA head and neck. Continuous cardiac monitoring and echo Avoid dextrose containing fluids, Maintain euglycemia, euthermia Neuro checks q4 hrs x 24 hrs and then per shift Head of bed 30 degrees Physical therapy/Occupational therapy/Speech therapy if failed dysphagia screen Neurology consult to follow

## 2023-12-11 NOTE — Assessment & Plan Note (Signed)
Blood pressure remained mildly elevated. Holding antihypertensives for permissive hypertension Will restart antihypertensives from tomorrow

## 2023-12-11 NOTE — Assessment & Plan Note (Addendum)
Followed by cardiology, last seen in November Holding metoprolol and losartan for permissive hypertension Continue apixaban and nitroglycerin Statin not seen on med list

## 2023-12-11 NOTE — Assessment & Plan Note (Addendum)
Ambulates with walker at baseline PT OT eval

## 2023-12-11 NOTE — Assessment & Plan Note (Addendum)
Chronic lower extremity edema Clinically euvolemic, though with chronic lower extremity edema, suspect venous insufficiency Hold Lasix for permissive hypertension

## 2023-12-11 NOTE — ED Notes (Addendum)
RN notified by family pt with soiled brief. This RN and EDT brought pt to triage room 1 to change.New brief and chux placed.

## 2023-12-12 DIAGNOSIS — K529 Noninfective gastroenteritis and colitis, unspecified: Secondary | ICD-10-CM | POA: Diagnosis not present

## 2023-12-12 DIAGNOSIS — E876 Hypokalemia: Secondary | ICD-10-CM

## 2023-12-12 DIAGNOSIS — G4700 Insomnia, unspecified: Secondary | ICD-10-CM

## 2023-12-12 DIAGNOSIS — I5032 Chronic diastolic (congestive) heart failure: Secondary | ICD-10-CM

## 2023-12-12 DIAGNOSIS — I639 Cerebral infarction, unspecified: Secondary | ICD-10-CM | POA: Diagnosis not present

## 2023-12-12 DIAGNOSIS — I872 Venous insufficiency (chronic) (peripheral): Secondary | ICD-10-CM

## 2023-12-12 DIAGNOSIS — I4821 Permanent atrial fibrillation: Secondary | ICD-10-CM

## 2023-12-12 DIAGNOSIS — Z7901 Long term (current) use of anticoagulants: Secondary | ICD-10-CM

## 2023-12-12 DIAGNOSIS — I2510 Atherosclerotic heart disease of native coronary artery without angina pectoris: Secondary | ICD-10-CM

## 2023-12-12 DIAGNOSIS — I160 Hypertensive urgency: Secondary | ICD-10-CM | POA: Diagnosis not present

## 2023-12-12 DIAGNOSIS — Z9181 History of falling: Secondary | ICD-10-CM

## 2023-12-12 DIAGNOSIS — Z79899 Other long term (current) drug therapy: Secondary | ICD-10-CM

## 2023-12-12 LAB — LIPID PANEL
Cholesterol: 185 mg/dL (ref 0–200)
HDL: 32 mg/dL — ABNORMAL LOW (ref >40)
LDL Cholesterol: 130 mg/dL — ABNORMAL HIGH (ref 0–99)
Total CHOL/HDL Ratio: 5.8 {ratio}
Triglycerides: 117 mg/dL (ref <150)
VLDL: 23 mg/dL (ref 0–40)

## 2023-12-12 LAB — CBC
HCT: 40.3 % (ref 36.0–46.0)
Hemoglobin: 13.7 g/dL (ref 12.0–15.0)
MCH: 30.4 pg (ref 26.0–34.0)
MCHC: 34 g/dL (ref 30.0–36.0)
MCV: 89.6 fL (ref 80.0–100.0)
Platelets: 342 10*3/uL (ref 150–400)
RBC: 4.5 MIL/uL (ref 3.87–5.11)
RDW: 14.3 % (ref 11.5–15.5)
WBC: 10.6 10*3/uL — ABNORMAL HIGH (ref 4.0–10.5)
nRBC: 0 % (ref 0.0–0.2)

## 2023-12-12 LAB — BASIC METABOLIC PANEL
Anion gap: 11 (ref 5–15)
BUN: 15 mg/dL (ref 8–23)
CO2: 27 mmol/L (ref 22–32)
Calcium: 8.8 mg/dL — ABNORMAL LOW (ref 8.9–10.3)
Chloride: 99 mmol/L (ref 98–111)
Creatinine, Ser: 0.91 mg/dL (ref 0.44–1.00)
GFR, Estimated: 58 mL/min — ABNORMAL LOW (ref >60)
Glucose, Bld: 116 mg/dL — ABNORMAL HIGH (ref 70–99)
Potassium: 3.2 mmol/L — ABNORMAL LOW (ref 3.5–5.1)
Sodium: 137 mmol/L (ref 135–145)

## 2023-12-12 LAB — MAGNESIUM: Magnesium: 2 mg/dL (ref 1.7–2.4)

## 2023-12-12 MED ORDER — POTASSIUM CHLORIDE 20 MEQ PO PACK
40.0000 meq | PACK | Freq: Once | ORAL | Status: AC
  Administered 2023-12-12: 40 meq via ORAL
  Filled 2023-12-12: qty 2

## 2023-12-12 MED ORDER — POTASSIUM CHLORIDE CRYS ER 20 MEQ PO TBCR
40.0000 meq | EXTENDED_RELEASE_TABLET | Freq: Once | ORAL | Status: AC
  Administered 2023-12-12: 40 meq via ORAL
  Filled 2023-12-12: qty 2

## 2023-12-12 MED ORDER — PNEUMOCOCCAL 20-VAL CONJ VACC 0.5 ML IM SUSY
0.5000 mL | PREFILLED_SYRINGE | INTRAMUSCULAR | Status: AC
  Administered 2023-12-14: 0.5 mL via INTRAMUSCULAR
  Filled 2023-12-12: qty 0.5

## 2023-12-12 NOTE — Hospital Course (Addendum)
Taken from H&P.  Tanya Ho is a 89 y.o. female with medical history significant for hypertension, nonobstructive CAD, DM, CKD llla, chronic diastolic CHF with chronic lower extremity edema (last seen by cardiology 10/2023), permanent atrial fibrillation on Eliquis, history of falls, hip fracture  in March 2024 s/p repair and ambulant with walker at baseline, who presents via EMS with weakness and numbness left arm. Patient recently had 1 day of illness with several bouts of nausea, vomiting and diarrhea which resolved by 10 PM a day prior to admission.  She woke up around 3 AM and having left arm weakness so EMS was called.  On presentation she had elevated blood pressure at 188/81, labs mostly stable with mild hypokalemia at 3.2, INR 1.5. EKG, personally viewed and interpreted showing A-fib at 65 with no ischemic ST-T wave changes CT head nonacute.  Showing age-related atrophy and chronic small vessel ischemic changes as well as unchanged extensive calcification in the carotid siphons and middle cerebral arteries.  MRI showed acute infarct in right medial medulla near the pontomedullary junction.  CTA head and neck shows several areas of stenosis-please see the full report.  Neurology was consulted and patient was admitted for stroke workup.  12/25: Blood pressure mildly elevated at 151/73-having permissive hypertension due to recent stroke.  Labs with potassium of 3.2, mild leukocytosis at 10.6 likely reactive, lipid panel with HDL of 32 and LDL of 130.  Pending echocardiogram, PT and OT evaluation. Neurology is recommending adding baby aspirin due to significant atherosclerosis, needed discussion with patient and family as patient is already on Eliquis.  12/26: Hemodynamically stable, slowly improving left-sided deficit.  PT and OT are recommending SNF, echocardiogram with EF of 50 to 55%, no regional wall motion abnormalities and indeterminate diastolic parameter.  Mildly elevated pulmonary  arterial pressure and mildly dilated right ventricle.  Moderately dilated right atrium.  12/27: Patient remained hemodynamically stable.  No new deficit.  Stable left sided deficit.  Blood pressure remained elevated despite restarting home antihypertensives so low-dose hydralazine was added and her PCP can titrate as appropriate.  Patient was not started on any antiplatelet as she will continue on Eliquis.  She was started on Crestor.  Patient also need to follow-up with outpatient neurology for stroke follow-up.  Patient will continue on current medications and need to have a close follow-up with her providers for further management.

## 2023-12-12 NOTE — Assessment & Plan Note (Addendum)
Chronic benzodiazepine use Resuming Home Xanax at lower dose of 0.5 mg nightly as needed Monitor for withdrawal which patient has had before(hallucinations), per daughter

## 2023-12-12 NOTE — Assessment & Plan Note (Addendum)
Suspect related to GI illness in combination with Lasix to treat Lower extremity swelling Potassium 3.2, magnesium normal Replete potassium and monitor

## 2023-12-12 NOTE — Plan of Care (Signed)
Patient had at least 3 bowel movements, the last one was a type 7, the first two were type 6. NIH stroke scale remained the same throughout shift (score of 7). She c/o intermittent dizziness and headache during shift. No worsening stroke symptoms, and tylenol was adequate for treating symptoms.   Problem: Education: Goal: Knowledge of disease or condition will improve Outcome: Progressing Goal: Knowledge of secondary prevention will improve (MUST DOCUMENT ALL) Outcome: Progressing Goal: Knowledge of patient specific risk factors will improve Loraine Leriche N/A or DELETE if not current risk factor) Outcome: Progressing   Problem: Ischemic Stroke/TIA Tissue Perfusion: Goal: Complications of ischemic stroke/TIA will be minimized Outcome: Progressing   Problem: Coping: Goal: Will verbalize positive feelings about self Outcome: Progressing Goal: Will identify appropriate support needs Outcome: Progressing   Problem: Health Behavior/Discharge Planning: Goal: Ability to manage health-related needs will improve Outcome: Progressing Goal: Goals will be collaboratively established with patient/family Outcome: Progressing   Problem: Self-Care: Goal: Ability to participate in self-care as condition permits will improve Outcome: Progressing Goal: Verbalization of feelings and concerns over difficulty with self-care will improve Outcome: Progressing Goal: Ability to communicate needs accurately will improve Outcome: Progressing   Problem: Nutrition: Goal: Risk of aspiration will decrease Outcome: Progressing Goal: Dietary intake will improve Outcome: Progressing   Problem: Education: Goal: Knowledge of General Education information will improve Description: Including pain rating scale, medication(s)/side effects and non-pharmacologic comfort measures Outcome: Progressing   Problem: Health Behavior/Discharge Planning: Goal: Ability to manage health-related needs will improve Outcome:  Progressing   Problem: Clinical Measurements: Goal: Ability to maintain clinical measurements within normal limits will improve Outcome: Progressing Goal: Will remain free from infection Outcome: Progressing Goal: Diagnostic test results will improve Outcome: Progressing Goal: Respiratory complications will improve Outcome: Progressing Goal: Cardiovascular complication will be avoided Outcome: Progressing   Problem: Activity: Goal: Risk for activity intolerance will decrease Outcome: Progressing   Problem: Nutrition: Goal: Adequate nutrition will be maintained Outcome: Progressing   Problem: Coping: Goal: Level of anxiety will decrease Outcome: Progressing   Problem: Elimination: Goal: Will not experience complications related to bowel motility Outcome: Progressing Goal: Will not experience complications related to urinary retention Outcome: Progressing   Problem: Safety: Goal: Ability to remain free from injury will improve Outcome: Progressing   Problem: Skin Integrity: Goal: Risk for impaired skin integrity will decrease Outcome: Progressing

## 2023-12-12 NOTE — Plan of Care (Signed)
  Problem: Ischemic Stroke/TIA Tissue Perfusion: Goal: Complications of ischemic stroke/TIA will be minimized Outcome: Progressing   Problem: Education: Goal: Knowledge of disease or condition will improve Outcome: Progressing Goal: Knowledge of secondary prevention will improve (MUST DOCUMENT ALL) Outcome: Progressing Goal: Knowledge of patient specific risk factors will improve Loraine Leriche N/A or DELETE if not current risk factor) Outcome: Progressing   Problem: Education: Goal: Knowledge of General Education information will improve Description: Including pain rating scale, medication(s)/side effects and non-pharmacologic comfort measures Outcome: Progressing   Problem: Clinical Measurements: Goal: Ability to maintain clinical measurements within normal limits will improve Outcome: Progressing Goal: Will remain free from infection Outcome: Progressing Goal: Diagnostic test results will improve Outcome: Progressing Goal: Respiratory complications will improve Outcome: Progressing Goal: Cardiovascular complication will be avoided Outcome: Progressing

## 2023-12-12 NOTE — Consult Note (Addendum)
Requesting Physician: Para March    Chief Complaint: Left arm weakness  I have been asked by Dr. Para March to see this patient in consultation for acute infarct.  HPI: Tanya Ho is an 89 y.o. female with medical history significant for hypertension, nonobstructive CAD, DM, CKD llla, chronic diastolic CHF with chronic lower extremity edema (last seen by cardiology 10/2023), permanent atrial fibrillation on Eliquis, history of falls, hip fracture  in March 2024 s/p repair and ambulant with walker at baseline, who presented via EMS on 12/24 with weakness and numbness left arm..  The day prior patient had a 1 day illness with several bouts of vomiting and diarrhea which resolved by the time she went to bed at 10 PM.. Patient awoke at 3 AM on 12/24 with difficulty opening her hand and inability to lift the arm. Had some improvement yesterday but today feels that her left arm is weaker and that her left leg is weaker than baseline.     Date last known well: Date: 12/10/2023 Time last known well: Time: 22:00 tPA Given: No: On Eliquis, outside time window  Thrombectomy Candidate: No, low NIHSS  Past Medical History:  Diagnosis Date   Back pain    Bruises easily    Coronary artery disease    NONOBSTRUCTIVE   Fatigue    Hyperlipidemia    Hypertension    Knee pain    OA (osteoarthritis)    Obesity    Palpitations    SOB (shortness of breath)     Past Surgical History:  Procedure Laterality Date   BREAST BIOPSY     LEFT BREAST   CARDIAC CATHETERIZATION  05/11/2006   OVERALL CARDIAC SIZE AND SILHOUETTE ARE NORMAL. EF ESTIMATED 60%   INTRAMEDULLARY (IM) NAIL INTERTROCHANTERIC Left 03/12/2023   Procedure: INTRAMEDULLARY (IM) NAIL INTERTROCHANTERIC;  Surgeon: Roby Lofts, MD;  Location: [facility] OR;  Service: Orthopedics;  Laterality: Left;   KIDNEY STONES  1982   KNEE ARTHROSCOPY  09/22/2008   VAGINAL HYSTERECTOMY      Family History  Problem Relation Age of Onset   Heart disease Mother     Stroke Mother    Heart attack Father    Social History:  reports that she has never smoked. She has never used smokeless tobacco. She reports that she does not drink alcohol and does not use drugs.  Allergies:  Allergies  Allergen Reactions   Codeine Other (See Comments)    Unknown reaction   Demerol [Meperidine Hcl] Other (See Comments)    Unknown reaction   Morphine And Codeine Other (See Comments)    Unknown reaction    Medications:  Prior to Admission medications   Medication Sig Start Date End Date Taking? Authorizing Provider  acetaminophen (TYLENOL) 325 MG tablet Take 650 mg by mouth every 6 (six) hours as needed.   Yes [provider]  ALPRAZolam Prudy Feeler) 1 MG tablet Take 1 tablet (1 mg total) by mouth at bedtime. 10/04/23  Yes Sharon Seller, NP  apixaban (ELIQUIS) 5 MG TABS tablet Take 1 tablet (5 mg total) by mouth 2 (two) times daily. 12/19/22  Yes [name], MD  Cholecalciferol (VITAMIN D-3) 25 MCG (1000 UT) CAPS One by mouth daily.   Yes [provider]  estradiol (ESTRACE) 0.1 MG/GM vaginal cream Place 1 Applicatorful vaginally. At bedtime every Tuesday, Friday, and Sunday.   Yes [provider]  famotidine (PEPCID) 20 MG tablet Take 20 mg by mouth at bedtime.   Yes [provider]  furosemide (LASIX) 40 MG tablet Take 40 mg by mouth daily. Can give extra 40 mg tablet as needed for weight gain of 3 lbs overnight or 6 lbs in a week. 10/11/23  Yes [provider]  hydrocortisone (ANUSOL-HC) 25 MG suppository Place 25 mg rectally every 12 (twelve) hours as needed for hemorrhoids.   Yes [provider]  losartan (COZAAR) 50 MG tablet Take 50 mg by mouth daily.   Yes [provider]  metoprolol tartrate (LOPRESSOR) 100 MG tablet Take 100 mg by mouth 2 (two) times daily.   Yes [provider]  metoprolol tartrate (LOPRESSOR) 50 MG tablet Take 50 mg by mouth daily as needed (Palpitations).   Yes  [provider]  nitroGLYCERIN (NITROSTAT) 0.4 MG SL tablet Place 1 tablet (0.4 mg total) under the tongue every 5 (five) minutes as needed for chest pain. 09/08/20  Yes Rosalio Macadamia, NP  ondansetron (ZOFRAN) 4 MG tablet Take 4 mg by mouth every 8 (eight) hours as needed for nausea or vomiting.   Yes [provider]  polyethylene glycol (MIRALAX / GLYCOLAX) 17 g packet Take 17 g by mouth daily as needed.   Yes [provider]  traMADol (ULTRAM) 50 MG tablet Take 1 tablet (50 mg total) by mouth every 6 (six) hours as needed. Give 2 tablets by mouth every 6 hours as needed for severe pain. 10/25/23  Yes Sharon Seller, NP  acetaminophen (TYLENOL) 325 MG tablet Take 650 mg by mouth every 4 (four) hours as needed. Patient not taking: Reported on 11/05/2023    [provider]  alum & mag hydroxide-simeth (MAALOX PLUS) 400-400-40 MG/5ML suspension Take 5 mLs by mouth every 4 (four) hours as needed for indigestion. Patient not taking: Reported on 12/11/2023    [provider]  amoxicillin-clavulanate (AUGMENTIN) 875-125 MG tablet Take 1 tablet by mouth 2 (two) times daily. Patient not taking: Reported on 11/05/2023    [provider]  bisacodyl (DULCOLAX) 10 MG suppository Place 10 mg rectally daily. As needed for constipation Patient not taking: Reported on 12/11/2023    [provider]  hydrALAZINE (APRESOLINE) 25 MG tablet Take 25 mg by mouth daily. Patient not taking: Reported on 10/30/2023    [provider]  hydroxypropyl methylcellulose / hypromellose (ISOPTO TEARS / GONIOVISC) 2.5 % ophthalmic solution Place 1 drop into both eyes every 4 (four) hours as needed for dry eyes. Patient not taking: Reported on 12/11/2023    [provider]  Zinc Oxide (TRIPLE PASTE) 12.8 % ointment Apply 1 Application topically. To peri area and skin folds topically twice daily Patient not taking: Reported on 12/11/2023    [provider]     ROS: History obtained from the patient  General ROS: negative for - chills, fatigue, fever, night sweats, weight gain or weight loss Psychological ROS: negative for - behavioral disorder, hallucinations, memory difficulties, mood swings or suicidal ideation Ophthalmic ROS: negative for - blurry vision, double vision, eye pain or loss of vision ENT ROS: negative for - epistaxis, nasal discharge, oral lesions, sore throat, tinnitus or vertigo Allergy and Immunology ROS: negative for - hives or itchy/watery eyes Hematological and Lymphatic ROS: negative for - bleeding problems, bruising or swollen lymph nodes Endocrine ROS: negative for - galactorrhea, hair pattern changes, polydipsia/polyuria or temperature intolerance Respiratory ROS: negative for - cough, hemoptysis, shortness of breath or wheezing Cardiovascular ROS: negative for - chest pain, dyspnea on exertion, edema or irregular  heartbeat Gastrointestinal ROS: negative for - abdominal pain, diarrhea, hematemesis, nausea/vomiting or stool incontinence Genito-Urinary ROS: negative for - dysuria, hematuria, incontinence or urinary frequency/urgency Musculoskeletal ROS: baseline left leg weakness due to past hip fracture Neurological ROS: as noted in HPI Dermatological ROS: negative for rash and skin lesion changes   Physical Examination: Blood pressure (!) 151/73, pulse 74, temperature 97.8 F (36.6 C), temperature source Oral, resp. rate 15, height 5\' 6"  (1.676 m), weight 83.5 kg, SpO2 92%.  HEENT-  Normocephalic, no lesions, without obvious abnormality.  Normal external eye and conjunctiva.  Normal TM's bilaterally.  Normal auditory canals and external ears. Normal external nose, mucus membranes and septum.  Normal pharynx. Cardiovascular- Single S1, S2 Lungs- CTA Abdomen- soft and nontender Extremities-  LE edema Skin-warm and dry, no hyperpigmentation, vitiligo, or suspicious lesions  Neurological Examination    Mental Status: Alert, oriented, thought content appropriate.  Speech fluent without evidence of aphasia.  Able to follow 3 step commands without difficulty. Cranial Nerves: II: Visual fields grossly normal, pupils equal, round, reactive to light and accommodation III,IV, VI: ptosis not present, extra-ocular motions intact bilaterally V,VII: smile symmetric, facial light touch sensation normal bilaterally VIII: hearing normal bilaterally XI: bilateral shoulder shrug XII: midline tongue extension Motor: Able to lift RUE and RLE against gravity and maintain.  2/5 strength in the LUE, 1/5 strength in the LLE Sensory: Pinprick and light touch slightly decreased in the RUE Deep Tendon Reflexes: Symmetric throughout Plantars: Right: downgoing   Left: upgoing Cerebellar: normal finger-to-nose, normal rapid alternating movements and normal heel-to-shin test Gait: normal gait and station      Laboratory Studies:  Basic Metabolic Panel: Recent Labs  Lab 12/11/23 1601 12/12/23 0604 12/12/23 0605  NA 136  --  137  K 3.2*  --  3.2*  CL 98  --  99  CO2 26  --  27  GLUCOSE 139*  --  116*  BUN 14  --  15  CREATININE 0.73  --  0.91  CALCIUM 9.1  --  8.8*  MG  --  2.0  --     Liver Function Tests: Recent Labs  Lab 12/11/23 1601  AST 22  ALT 10  ALKPHOS 74  BILITOT 1.0  PROT 7.1  ALBUMIN 3.4*   No results for input(s): "LIPASE", "AMYLASE" in the last 168 hours. No results for input(s): "AMMONIA" in the last 168 hours.  CBC: Recent Labs  Lab 12/11/23 1601 12/12/23 0605  WBC 9.4 10.6*  NEUTROABS 6.7  --   HGB 14.2 13.7  HCT 43.2 40.3  MCV 92.9 89.6  PLT 347 342    Cardiac Enzymes: No results for input(s): "CKTOTAL", "CKMB", "CKMBINDEX", "TROPONINI" in the last 168 hours.  BNP: Invalid input(s): "POCBNP"  CBG: No results for input(s): "GLUCAP" in the last 168 hours.  Microbiology: Results for orders placed or performed in visit on 05/02/23  Microscopic  Examination     Status: Abnormal   Collection Time: 05/02/23  4:30 PM   Urine  Result Value Ref Range Status   WBC, UA >30 (A) 0 - 5 /hpf Final   RBC, Urine 0-2 0 - 2 /hpf Final   Epithelial Cells (non renal) 0-10 0 - 10 /hpf Final   Casts Present (A) None seen /lpf Final   Cast Type Hyaline casts N/A Final   Mucus, UA Present (A) Not Estab. Final   Bacteria, UA Moderate (A) None seen/Few Final   Yeast, UA Present (A) None seen  Final  CULTURE, URINE COMPREHENSIVE     Status: Abnormal   Collection Time: 05/02/23  4:55 PM   Specimen: Urine   UR  Result Value Ref Range Status   Urine Culture, Comprehensive Final report (A)  Final   Organism ID, Bacteria Candida kefyr (A)  Final    Comment: 50,000-100,000 colony forming units per mL Susceptibility not normally performed on this organism.     Coagulation Studies: Recent Labs    12/11/23 1601  LABPROT 18.2*  INR 1.5*    Urinalysis: No results for input(s): "COLORURINE", "LABSPEC", "PHURINE", "GLUCOSEU", "HGBUR", "BILIRUBINUR", "KETONESUR", "PROTEINUR", "UROBILINOGEN", "NITRITE", "LEUKOCYTESUR" in the last 168 hours.  Invalid input(s): "APPERANCEUR"  Lipid Panel:    Component Value Date/Time   CHOL 185 12/12/2023 0604   CHOL 187 03/04/2018 1523   TRIG 117 12/12/2023 0604   HDL 32 (L) 12/12/2023 0604   HDL 44 03/04/2018 1523   CHOLHDL 5.8 12/12/2023 0604   VLDL 23 12/12/2023 0604   LDLCALC 130 (H) 12/12/2023 0604   LDLCALC 90 03/04/2018 1523    HgbA1C:  Lab Results  Component Value Date   HGBA1C 6.4 06/28/2023    Urine Drug Screen:  No results found for: "LABOPIA", "COCAINSCRNUR", "LABBENZ", "AMPHETMU", "THCU", "LABBARB"  Alcohol Level:  Recent Labs  Lab 12/11/23 1601  ETH <10    Other results: EKG: atrial fibrillation, rate 65 bpm.  Imaging: CT Angio Head Neck W WO CM Result Date: 12/11/2023 CLINICAL DATA:  Left hand tingling and weakness, left arm numbness; acute infarct in the medulla on same-day MRI  EXAM: CT ANGIOGRAPHY HEAD AND NECK WITH AND WITHOUT CONTRAST TECHNIQUE: Multidetector CT imaging of the head and neck was performed using the standard protocol during bolus administration of intravenous contrast. Multiplanar CT image reconstructions and MIPs were obtained to evaluate the vascular anatomy. Carotid stenosis measurements (when applicable) are obtained utilizing NASCET criteria, using the distal internal carotid diameter as the denominator. RADIATION DOSE REDUCTION: This exam was performed according to the departmental dose-optimization program which includes automated exposure control, adjustment of the mA and/or kV according to patient size and/or use of iterative reconstruction technique. CONTRAST:  75mL OMNIPAQUE IOHEXOL 350 MG/ML SOLN COMPARISON:  12/11/2023 CT head, 12/11/2023 MRI head; no prior CTA available FINDINGS: CT HEAD FINDINGS For noncontrast findings, please see same day CT head. CTA NECK FINDINGS Aortic arch: Two-vessel arch with a common origin of the brachiocephalic and left common carotid arteries. Imaged portion shows no evidence of aneurysm or dissection. 50% stenosis at the origin of the left common carotid artery (series 7, image 88). 30% stenosis at the origin of the left subclavian artery (series 7, image 98). 40% stenosis at the origin of the brachiocephalic artery (series 6, image 284). Right carotid system: 50% stenosis in the proximal right ICA (series 6, image 194). No evidence of dissection. Left carotid system: 60% stenosis in the proximal left ICA (series 6, image 193). No evidence of dissection. Vertebral arteries: The right vertebral artery is occluded at its origin (series 6, image 271) and throughout the V1, V2, and V3 segments, with minimal likely retrograde opacification in the distal right V3 (series 6, image 154). Moderate stenosis at the origin of the left vertebral artery (series 7, image 102). The left vertebral artery is otherwise patent to the skull base,  without significant stenosis or evidence of dissection. Skeleton: No acute osseous abnormality. Degenerative changes in the cervical spine. Other neck: No acute finding. Upper chest: No focal pulmonary opacity or pleural  effusion. Review of the MIP images confirms the above findings CTA HEAD FINDINGS Anterior circulation: Both internal carotid arteries are patent to the termini, with mild stenosis in the bilateral cavernous and supraclinoid segments. Patent left A1. Short-segment nonopacification of the proximal right A1 (series 6, image 97). Opacification of the distal right A1 may be retrograde. Normal anterior communicating artery. Anterior cerebral arteries are patent to their distal aspects without significant stenosis. Multifocal stenosis in the M1 segments bilaterally, which is severe in the distal left M 1 (series 6, image 98) and moderate in the right M 1 (series 6, image 100). Additional multifocal narrowing in the M2 segment (series 6, image 95 and 101). Distal MCA branches are irregular but appear grossly perfused. Posterior circulation: The left vertebral artery is patent to the vertebrobasilar junction. The right vertebral artery is patent, with diminished, likely retrograde opacification. Posterior inferior cerebellar arteries patent proximally. Basilar patent to its distal aspect without significant stenosis. Superior cerebellar arteries patent proximally. Patent P1 segments, particularly hypoplastic on the left. Near fetal origin of the left PCA from the left posterior communicating artery, which may have severe stenosis at its origin (series 6, image 106). Severe stenosis in the proximal right P2 (series 6, image 98). The remainder of the PCAs are irregular but without focal stenosis Venous sinuses: As permitted by contrast timing, patent. Anatomic variants: Near fetal origin of the left PCA. No evidence of aneurysm or vascular malformation. Review of the MIP images confirms the above findings  IMPRESSION: 1. Occlusion of the right vertebral artery at its origin and throughout the V1, V2, and V3 segments, with minimal likely retrograde opacification in the distal right V3 and in the V4 segment. This is of indeterminate acuity but is favored to be chronic. 2. Short-segment nonopacification of the proximal right A1, which may be due to severe stenosis or occlusion. Opacification of the distal right A1 may be retrograde. 3. Severe stenosis in the distal left M1 and moderate stenosis in the right M1. Additional multifocal narrowing in the M2 segments bilaterally. Distal MCA branches are irregular but appear grossly perfused. 4. Near fetal origin of the left PCA from the left posterior communicating artery, which may have severe stenosis at its origin. Severe stenosis in the proximal right P2. 5. 50% stenosis in the proximal right ICA and 60% stenosis in the proximal left ICA. 6. 50% stenosis at the origin of the left common carotid artery, 30% stenosis at the origin of the left subclavian artery, and 40% stenosis at the origin of the brachiocephalic artery. Electronically Signed   By: Wiliam Ke M.D.   On: 12/11/2023 23:12   MR BRAIN WO CONTRAST Result Date: 12/11/2023 CLINICAL DATA:  Left hand tingling and weakness, left arm numbness EXAM: MRI HEAD WITHOUT CONTRAST TECHNIQUE: Multiplanar, multiecho pulse sequences of the brain and surrounding structures were obtained without intravenous contrast. COMPARISON:  No prior MRI available, correlation is made with 12/11/2023 CT head FINDINGS: Brain: Restricted diffusion with ADC correlate in the right medial medulla near the pontomedullary junction (series 5, image 12 and series 13, image 18). No acute hemorrhage, mass, mass effect, or midline shift. No hydrocephalus or extra-axial collection. Partial empty sella. Normal craniocervical junction. No hemosiderin deposition to suggest remote hemorrhage. Age related cerebral atrophy. Remote lacunar infarcts in  the bilateral basal ganglia and right thalamus. Vascular: Somewhat diminished signal in the right vertebral artery. Otherwise normal arterial flow voids. Skull and upper cervical spine: Normal marrow signal. Sinuses/Orbits: Clear paranasal sinuses.  Status post bilateral lens replacements. Other: Trace fluid in left mastoid air cells. IMPRESSION: 1. Acute infarct in the right medial medulla near the pontomedullary junction. 2. Somewhat diminished signal in the right vertebral artery, which could be due to slow flow or occlusion. Consider further evaluation with CTA. These results were called by telephone at the time of interpretation on 12/11/2023 at 10:10 pm to provider [facility] , who verbally acknowledged these results. Electronically Signed   By: Wiliam Ke M.D.   On: 12/11/2023 22:10   CT HEAD WO CONTRAST Result Date: 12/11/2023 CLINICAL DATA:  Neuro deficit, acute, stroke suspected. Tingling and weakness of the left arm and hand. EXAM: CT HEAD WITHOUT CONTRAST TECHNIQUE: Contiguous axial images were obtained from the base of the skull through the vertex without intravenous contrast. RADIATION DOSE REDUCTION: This exam was performed according to the departmental dose-optimization program which includes automated exposure control, adjustment of the mA and/or kV according to patient size and/or use of iterative reconstruction technique. COMPARISON:  03/12/2023 FINDINGS: Brain: No focal abnormality affects the brainstem or cerebellum. Cerebral hemispheres show generalized age related atrophy with chronic small-vessel ischemic changes of the white matter, thalami and basal ganglia. No sign of acute infarction, mass lesion, hemorrhage, hydrocephalus or extra-axial collection. Vascular: Extensive calcification associated with the carotid siphons and middle cerebral arteries. Similar to the prior study. Skull: Normal Sinuses/Orbits: Clear/normal Other: None IMPRESSION: No acute CT finding. Age related atrophy  with chronic small-vessel ischemic changes of the white matter, thalami and basal ganglia. Extensive calcification associated with the carotid siphons and middle cerebral arteries, unchanged from the prior exam. Electronically Signed   By: Paulina Fusi M.D.   On: 12/11/2023 16:20    Assessment: 89 y.o. female with medical history significant for hypertension, nonobstructive CAD, DM, CKD llla, chronic diastolic CHF with chronic lower extremity edema (last seen by cardiology 10/2023), permanent atrial fibrillation on Eliquis, history of falls, hip fracture  in March 2024 s/p repair and ambulant with walker at baseline, who presented via EMS on 12/24 with weakness and numbness left arm..  The day prior patient had a 1 day illness with several bouts of vomiting and diarrhea which resolved by the time she went to bed at 10 PM.. Patient awoke at 3 AM on 12/24 with difficulty opening her hand and inability to lift the arm. Had some improvement yesterday but today feels that her left arm is weaker and that her left leg is weaker than baseline.  Head CT personally reviewed and shows no acute changes.  CTA of the head and neck reviewed as well and shows right vertebral occlusion, severe stenosis of A1, distal M1, proximal right P2, 50% stenosis of left common carotid and proximal right ICA.  Echocardiogram pending.   MRI of the brain personally reviewed and reveals a right medullary acute infarct likely secondary to small vessel disease.   A1c pending, LDL 130.  Stroke Risk Factors - atrial fibrillation and diabetes mellitus  Plan: 1. High intensity statin initiation with target LDL<70 2. A1c pending with target A1c<7.0 3. PT consult, OT consult, Speech consult 4. Echocardiogram pending.  May be performed as an outpatient if necessary 5. Prophylactic therapy-Continue Eliquis.  Would add ASA 81mg  as wel due to degree of cerebral atherosclerosis 6. NPO until RN stroke swallow screen 7. Telemetry monitoring 8.  Frequent neuro checks  No further neurologic intervention is recommended at this time.  If further questions arise, please call or page at that time.  Thank you for allowing neurology to participate in the care of this patient.    12/12/2023  10:18 AM  Thana Farr, MD Neurology

## 2023-12-12 NOTE — ED Notes (Signed)
Pt changed at this time. Breif was wet. New brief, chux, and bed sheet applied.

## 2023-12-12 NOTE — Assessment & Plan Note (Addendum)
Patient had a self-limiting 1 illness on 12/23 with vomiting and diarrhea which resolved prior to Onset of her stroke symptoms Currently denies nausea, vomiting or diarrhea Oral hydration encouraged Monitor for recurrent symptoms

## 2023-12-12 NOTE — ED Notes (Signed)
Pt given meal tray at this time 

## 2023-12-12 NOTE — IPAL (Signed)
  Interdisciplinary Goals of Care Family Meeting   Date carried out: 12/12/2023  Location of the meeting: Bedside  Member's involved: Physician and Family Member or next of kin  Durable Power of Attorney or acting medical decision maker: Patient and daughter Tanya Ho     Discussion: We discussed goals of care for Tanya Ho  I have reviewed medical records including EPIC notes, labs and imaging, assessed the patient and then met with daughter and other family members to discuss major active diagnoses, plan of care, natural trajectory, prognosis, GOC, EOL wishes, disposition and options including Full code/DNI/DNR  Questions and concerns were addressed. They are  in agreement to continue current plan of care . Election for DNR/DNI status.   Code status:   Code Status: Limited: Do not attempt resuscitation (DNR) -DNR-LIMITED -Do Not Intubate/DNI    Disposition: Continue current acute care  Time spent for the meeting: 30    Andris Baumann, MD  12/12/2023, 1:37 AM

## 2023-12-12 NOTE — Progress Notes (Signed)
Progress Note   Patient: Tanya Ho NFA:[id number] DOB: [birth year] DOA: 12/11/2023     1 DOS: the patient was seen and examined on 12/12/2023   Brief hospital course: Taken from H&P.  Tanya Ho is a 89 y.o. female with medical history significant for hypertension, nonobstructive CAD, DM, CKD llla, chronic diastolic CHF with chronic lower extremity edema (last seen by cardiology 10/2023), permanent atrial fibrillation on Eliquis, history of falls, hip fracture  in March 2024 s/p repair and ambulant with walker at baseline, who presents via EMS with weakness and numbness left arm. Patient recently had 1 day of illness with several bouts of nausea, vomiting and diarrhea which resolved by 10 PM a day prior to admission.  She woke up around 3 AM and having left arm weakness so EMS was called.  On presentation she had elevated blood pressure at 188/81, labs mostly stable with mild hypokalemia at 3.2, INR 1.5. EKG, personally viewed and interpreted showing A-fib at 65 with no ischemic ST-T wave changes CT head nonacute.  Showing age-related atrophy and chronic small vessel ischemic changes as well as unchanged extensive calcification in the carotid siphons and middle cerebral arteries.  MRI showed acute infarct in right medial medulla near the pontomedullary junction.  CTA head and neck shows several areas of stenosis-please see the full report.  Neurology was consulted and patient was admitted for stroke workup.  12/25: Blood pressure mildly elevated at 151/73-having permissive hypertension due to recent stroke.  Labs with potassium of 3.2, mild leukocytosis at 10.6 likely reactive, lipid panel with HDL of 32 and LDL of 130.  Pending echocardiogram, PT and OT evaluation. Neurology is recommending adding baby aspirin due to significant atherosclerosis, needed discussion with patient and family as patient is already on Eliquis.   Assessment and Plan: * Acute CVA (cerebrovascular accident)  (HCC) Chronic anticoagulation Patient is persistent left-sided deficit Permissive hypertension for first 24-48 hrs post stroke onset: Prn Labetalol IV or Vasotec IV If BP greater than 220/120  Statins for LDL goal less than 70 Continue Eliquis.  Neurology is recommending adding low-dose aspirin but need a discussion about risk and benefit as she is already on Eliquis MRI and CTA head and neck showing acute infarct right medial medulla and multiple areas of stenosis. Continuous cardiac monitoring Pending echocardiogram, PT and OT evaluation TOC consult as patient might need a higher level of care-currently resides at [facility] Neurology consult to follow   Hypertensive urgency Blood pressure remained mildly elevated. Holding antihypertensives for permissive hypertension Will restart antihypertensives from tomorrow  Hypokalemia Suspect related to GI illness in combination with Lasix to treat Lower extremity swelling Potassium 3.2, magnesium normal Replete potassium and monitor  Acute gastroenteritis Patient had a self-limiting 1 illness on 12/23 with vomiting and diarrhea which resolved prior to Onset of her stroke symptoms Currently denies nausea, vomiting or diarrhea Oral hydration encouraged Monitor for recurrent symptoms  Chronic heart failure with preserved ejection fraction (HFpEF) (HCC) Chronic lower extremity edema Clinically euvolemic, though with chronic lower extremity edema, suspect venous insufficiency Hold Lasix for permissive hypertension  Permanent atrial fibrillation (HCC) Continue Eliquis  Holding metoprolol for permissive hypertension  Coronary artery disease Followed by cardiology, last seen in November Holding metoprolol and losartan for permissive hypertension Continue apixaban and nitroglycerin Statin not seen on med list  Insomnia/Anxiety Chronic benzodiazepine use Resuming Home Xanax at lower dose of 0.5 mg nightly as needed Monitor for  withdrawal which patient has had before(hallucinations), per daughter  Personal history of falls Ambulates with walker at baseline Had hip fracture in March 2024.Had another fall in October 2024 PT OT eval   Subjective: Patient was seen and examined today.  Continue to have left sided weakness, upper extremity more than lower extremity.  Physical Exam: Vitals:   12/12/23 0007 12/12/23 0156 12/12/23 0344 12/12/23 0821  BP:  (!) 166/86  (!) 151/73  Pulse:  75  74  Resp:  17  15  Temp: (!) 97.5 F (36.4 C)  (!) 97.3 F (36.3 C) 97.8 F (36.6 C)  TempSrc: Oral  Oral Oral  SpO2:  99%  92%  Weight:   83.5 kg   Height:   5\' 6"  (1.676 m)    General.  Frail, hard of hearing elderly lady, in no acute distress. Pulmonary.  Lungs clear bilaterally, normal respiratory effort. CV.  Regular rate and rhythm, no JVD, rub or murmur. Abdomen.  Soft, nontender, nondistended, BS positive. CNS.  Alert and oriented .  2/5 on LUE, 3/5 on LLE Extremities.  No edema, no cyanosis, pulses intact and symmetrical.   Data Reviewed: Prior data reviewed  Family Communication: Discussed with son and DIL at bedside  Disposition: Status is: Inpatient Remains inpatient appropriate because: Severity of illness  Planned Discharge Destination: Skilled nursing facility  DVT prophylaxis.  Eliquis Time spent: 50 minutes  This record has been created using Conservation officer, historic buildings. Errors have been sought and corrected,but may not always be located. Such creation errors do not reflect on the standard of care.   Author: Arnetha Courser, MD 12/12/2023 2:24 PM  For on call review [email or web address].

## 2023-12-13 ENCOUNTER — Inpatient Hospital Stay (HOSPITAL_COMMUNITY)
Admit: 2023-12-13 | Discharge: 2023-12-13 | Disposition: A | Discharge status: Home / Self Care | Payer: Medicare Other | Attending: Internal Medicine | Admitting: Internal Medicine

## 2023-12-13 DIAGNOSIS — Z7901 Long term (current) use of anticoagulants: Secondary | ICD-10-CM | POA: Diagnosis not present

## 2023-12-13 DIAGNOSIS — K529 Noninfective gastroenteritis and colitis, unspecified: Secondary | ICD-10-CM | POA: Diagnosis not present

## 2023-12-13 DIAGNOSIS — I6389 Other cerebral infarction: Secondary | ICD-10-CM | POA: Diagnosis not present

## 2023-12-13 DIAGNOSIS — I639 Cerebral infarction, unspecified: Secondary | ICD-10-CM | POA: Diagnosis not present

## 2023-12-13 DIAGNOSIS — I160 Hypertensive urgency: Secondary | ICD-10-CM | POA: Diagnosis not present

## 2023-12-13 LAB — HEMOGLOBIN A1C
Hgb A1c MFr Bld: 6.5 % — ABNORMAL HIGH (ref 4.8–5.6)
Mean Plasma Glucose: 140 mg/dL

## 2023-12-13 LAB — ECHOCARDIOGRAM COMPLETE
AR max vel: 1.97 cm2
AV Area VTI: 2.05 cm2
AV Area mean vel: 1.84 cm2
AV Mean grad: 4 mm[Hg]
AV Peak grad: 7.6 mm[Hg]
Ao pk vel: 1.38 m/s
Area-P 1/2: 3.66 cm2
Height: 66 in
MV VTI: 2.61 cm2
S' Lateral: 3 cm
Weight: 2945.35 [oz_av]

## 2023-12-13 LAB — BASIC METABOLIC PANEL
Anion gap: 9 (ref 5–15)
BUN: 12 mg/dL (ref 8–23)
CO2: 26 mmol/L (ref 22–32)
Calcium: 8.9 mg/dL (ref 8.9–10.3)
Chloride: 101 mmol/L (ref 98–111)
Creatinine, Ser: 0.69 mg/dL (ref 0.44–1.00)
GFR, Estimated: >60 mL/min (ref >60)
Glucose, Bld: 145 mg/dL — ABNORMAL HIGH (ref 70–99)
Potassium: 3.6 mmol/L (ref 3.5–5.1)
Sodium: 136 mmol/L (ref 135–145)

## 2023-12-13 MED ORDER — KETOROLAC TROMETHAMINE 15 MG/ML IJ SOLN
15.0000 mg | Freq: Once | INTRAMUSCULAR | Status: AC
  Administered 2023-12-13: 15 mg via INTRAVENOUS
  Filled 2023-12-13: qty 1

## 2023-12-13 MED ORDER — LOSARTAN POTASSIUM 50 MG PO TABS
50.0000 mg | ORAL_TABLET | Freq: Every day | ORAL | Status: DC
  Administered 2023-12-13 – 2023-12-14 (×2): 50 mg via ORAL
  Filled 2023-12-13 (×2): qty 1

## 2023-12-13 MED ORDER — FUROSEMIDE 40 MG PO TABS
40.0000 mg | ORAL_TABLET | Freq: Every day | ORAL | Status: DC
  Administered 2023-12-13 – 2023-12-14 (×2): 40 mg via ORAL
  Filled 2023-12-13 (×2): qty 1

## 2023-12-13 MED ORDER — METOPROLOL TARTRATE 50 MG PO TABS
100.0000 mg | ORAL_TABLET | Freq: Two times a day (BID) | ORAL | Status: DC
  Administered 2023-12-13 – 2023-12-14 (×3): 100 mg via ORAL
  Filled 2023-12-13 (×3): qty 2

## 2023-12-13 NOTE — Plan of Care (Signed)
  Problem: Education: Goal: Knowledge of disease or condition will improve Outcome: Progressing Goal: Knowledge of secondary prevention will improve (MUST DOCUMENT ALL) Outcome: Progressing Goal: Knowledge of patient specific risk factors will improve Loraine Leriche N/A or DELETE if not current risk factor) Outcome: Progressing   Problem: Ischemic Stroke/TIA Tissue Perfusion: Goal: Complications of ischemic stroke/TIA will be minimized Outcome: Progressing   Problem: Health Behavior/Discharge Planning: Goal: Ability to manage health-related needs will improve Outcome: Progressing Goal: Goals will be collaboratively established with patient/family Outcome: Progressing   Problem: Self-Care: Goal: Ability to participate in self-care as condition permits will improve Outcome: Progressing Goal: Verbalization of feelings and concerns over difficulty with self-care will improve Outcome: Progressing Goal: Ability to communicate needs accurately will improve Outcome: Progressing   Problem: Nutrition: Goal: Risk of aspiration will decrease Outcome: Progressing Goal: Dietary intake will improve Outcome: Progressing   Problem: Education: Goal: Knowledge of General Education information will improve Description: Including pain rating scale, medication(s)/side effects and non-pharmacologic comfort measures Outcome: Progressing   Problem: Health Behavior/Discharge Planning: Goal: Ability to manage health-related needs will improve Outcome: Progressing   Problem: Clinical Measurements: Goal: Ability to maintain clinical measurements within normal limits will improve Outcome: Progressing Goal: Will remain free from infection Outcome: Progressing Goal: Diagnostic test results will improve Outcome: Progressing Goal: Respiratory complications will improve Outcome: Progressing Goal: Cardiovascular complication will be avoided Outcome: Progressing   Problem: Activity: Goal: Risk for activity  intolerance will decrease Outcome: Progressing   Problem: Nutrition: Goal: Adequate nutrition will be maintained Outcome: Progressing   Problem: Coping: Goal: Level of anxiety will decrease Outcome: Progressing   Problem: Elimination: Goal: Will not experience complications related to bowel motility Outcome: Progressing Goal: Will not experience complications related to urinary retention Outcome: Progressing   Problem: Safety: Goal: Ability to remain free from injury will improve Outcome: Progressing   Problem: Skin Integrity: Goal: Risk for impaired skin integrity will decrease Outcome: Progressing

## 2023-12-13 NOTE — Assessment & Plan Note (Signed)
Chronic anticoagulation Patient is persistent left-sided deficit Permissive hypertension for first 24-48 hrs post stroke onset: Prn Labetalol IV or Vasotec IV If BP greater than 220/120  Statins for LDL goal less than 70 Continue Eliquis.  Neurology is recommending adding low-dose aspirin but need a discussion about risk and benefit as she is already on Eliquis MRI and CTA head and neck showing acute infarct right medial medulla and multiple areas of stenosis. Continuous cardiac monitoring PT and OT are recommending SNF Pending echocardiogram,  TOC consult as patient might need a higher level of care-currently resides at [facility] Not doing antiplatelet as patient is already on Eliquis

## 2023-12-13 NOTE — Evaluation (Signed)
Occupational Therapy Evaluation Patient Details Name: Tanya Ho MRN: [id number] DOB: 10/21/1926 Today's Date: 12/13/2023   History of Present Illness Tanya Ho is a 89 y.o. female with medical history significant for hypertension, nonobstructive CAD, DM, CKD llla, chronic diastolic CHF with chronic lower extremity edema (last seen by cardiology 10/2023), permanent atrial fibrillation on Eliquis, history of falls, hip fracture  in March 2024 s/p repair and ambulant with walker at baseline, who presents via EMS with weakness and numbness left arm. MRI reveals acute infarct in the right medial medulla near the pontomedullary  junction.   Clinical Impression   Pt was seen for PT/OT co-evaluation this date. Prior to hospital admission, pt resides at [facility]. Ambulated with a walker, unable to provide further history. Unsure of need for assist with ADLs.  Pt presents to acute OT demonstrating impaired ADL performance and functional mobility 2/2 weakness, ROM, balance and coordination deficits as well as confusion (See OT problem list for additional functional deficits). Pt currently requires Mod A x2 for bed mobility and Max A x2 for STS and SPT to [facility] then to recliner during session. Total/Max A for peri-care. Pt noted with skin tear to LLE from pinching on BSC-NT notified. Pt c/o of moderate neck pain during session. L sided deficits apparent, no AROM to LUE except very minimal shoulder extension while seated in recliner. Full PROM to LUE. LLE weakness noted requiring hands on assist/placement of foot prior to transfers.  Pt would benefit from skilled OT services to address noted impairments and functional limitations (see below for any additional details) in order to maximize safety and independence while minimizing falls risk and caregiver burden. Do anticipate the need for follow up OT services upon acute hospital DC.        If plan is discharge home, recommend the following: Two people to  help with walking and/or transfers;Assistance with cooking/housework;Assist for transportation;Help with stairs or ramp for entrance;A lot of help with bathing/dressing/bathroom    Functional Status Assessment  Patient has had a recent decline in their functional status and demonstrates the ability to make significant improvements in function in a reasonable and predictable amount of time.  Equipment Recommendations  Other (comment) (defer)    Recommendations for Other Services       Precautions / Restrictions Precautions Precautions: Fall Restrictions Weight Bearing Restrictions Per Provider Order: No      Mobility Bed Mobility Overal bed mobility: Needs Assistance Bed Mobility: Supine to Sit     Supine to sit: Mod assist, +2 for physical assistance, HOB elevated, Used rails          Transfers Overall transfer level: Needs assistance Equipment used: 2 person hand held assist Transfers: Sit to/from Stand, Bed to chair/wheelchair/BSC Sit to Stand: Max assist, +2 physical assistance Stand pivot transfers: Max assist, +2 physical assistance                Balance Overall balance assessment: Needs assistance Sitting-balance support: Feet supported Sitting balance-Leahy Scale: Poor     Standing balance support: Bilateral upper extremity supported, During functional activity Standing balance-Leahy Scale: Zero                             ADL either performed or assessed with clinical judgement   ADL Overall ADL's : Needs assistance/impaired Eating/Feeding: Supervision/ safety Eating/Feeding Details (indicate cue type and reason): needed assist for opening juice, silverware, etc.  Toilet Transfer: Maximal assistance;Stand-pivot;BSC/3in1;+2 for physical assistance   Toileting- Clothing Manipulation and Hygiene: Total assistance;Sit to/from stand               Vision         Perception         Praxis          Pertinent Vitals/Pain Pain Assessment Pain Assessment: Faces Faces Pain Scale: Hurts little more Pain Location: LLE when sitting on BSC Pain Descriptors / Indicators: Sore Pain Intervention(s): Monitored during session     Extremity/Trunk Assessment Upper Extremity Assessment Upper Extremity Assessment: Right hand dominant;LUE deficits/detail LUE Deficits / Details: minimal Active extension to L shoulder; other full PROM, no AROM to the rest of the extremity LUE Coordination: decreased gross motor;decreased fine motor   Lower Extremity Assessment Lower Extremity Assessment: Defer to PT evaluation;Generalized weakness;LLE deficits/detail;Difficult to assess due to impaired cognition LLE Deficits / Details: demonstrates weakness of L LE during  mobility. Requires assist to position left leg and keep it from sliding forward during transfer   Cervical / Trunk Assessment Cervical / Trunk Assessment: Normal   Communication Communication Communication: Hearing impairment Cueing Techniques: Verbal cues;Tactile cues;Gestural cues   Cognition Arousal: Lethargic Behavior During Therapy: Flat affect Overall Cognitive Status: Impaired/Different from baseline Area of Impairment: Orientation, Following commands, Awareness, Problem solving, Safety/judgement                 Orientation Level: Disoriented to, Place, Time, Situation     Following Commands: Follows one step commands inconsistently, Follows one step commands with increased time Safety/Judgement: Decreased awareness of safety Awareness: Intellectual Problem Solving: Slow processing, Decreased initiation, Difficulty sequencing, Requires verbal cues, Requires tactile cues       General Comments  skin tear to LLE from BSC-NT notified    Exercises Other Exercises Other Exercises: Ot edu in role of acute OT and importance of therapy to maximize safety/IND.   Shoulder Instructions      Home Living Family/patient  expects to be discharged to:: Skilled nursing facility                                        Prior Functioning/Environment Prior Level of Function : Patient poor historian/Family not available;Independent/Modified Independent             Mobility Comments: was using RW for ambulation prior to hip fracture in March, unsure of mobility since. ADLs Comments: lived at [place] she was IND, but unsure        OT Problem List: Decreased strength;Decreased coordination;Decreased range of motion;Decreased cognition;Decreased safety awareness;Impaired tone;Impaired balance (sitting and/or standing);Impaired UE functional use      OT Treatment/Interventions: Self-care/ADL training;Therapeutic exercise;Therapeutic activities;Neuromuscular education;DME and/or AE instruction;Patient/family education;Balance training    OT Goals(Current goals can be found in the care plan section) Acute Rehab OT Goals Patient Stated Goal: improve ROM, strength in L side OT Goal Formulation: With patient Time For Goal Achievement: 12/27/23 Potential to Achieve Goals: Fair ADL Goals Pt Will Perform Grooming: sitting;with contact guard assist Pt Will Perform Upper Body Bathing: with min assist;sitting Pt Will Perform Upper Body Dressing: with min assist;sitting Pt Will Transfer to Toilet: with mod assist;stand pivot transfer;bedside commode Pt Will Perform Toileting - Clothing Manipulation and hygiene: with mod assist;sit to/from stand;sitting/lateral leans Pt/caregiver will Perform Home Exercise Program: Left upper extremity;Increased ROM;Increased strength;With minimal assist  OT Frequency: Min  1X/week    Co-evaluation PT/OT/SLP Co-Evaluation/Treatment: Yes Reason for Co-Treatment: For patient/therapist safety;To address functional/ADL transfers;Necessary to address cognition/behavior during functional activity PT goals addressed during session: Mobility/safety with  mobility;Balance OT goals addressed during session: ADL's and self-care      AM-PAC OT "6 Clicks" Daily Activity     Outcome Measure Help from another person eating meals?: A Little Help from another person taking care of personal grooming?: A Little Help from another person toileting, which includes using toliet, bedpan, or urinal?: Total Help from another person bathing (including washing, rinsing, drying)?: A Lot Help from another person to put on and taking off regular upper body clothing?: A Lot Help from another person to put on and taking off regular lower body clothing?: A Lot 6 Click Score: 13   End of Session Nurse Communication: Mobility status  Activity Tolerance: Patient limited by fatigue Patient left: in chair;with call bell/phone within reach  OT Visit Diagnosis: Other abnormalities of gait and mobility (R26.89);Hemiplegia and hemiparesis;Muscle weakness (generalized) (M62.81) Hemiplegia - Right/Left: Left Hemiplegia - dominant/non-dominant: Non-Dominant Hemiplegia - caused by: Cerebral infarction                Time: 2841-3244 OT Time Calculation (min): 24 min Charges:  OT General Charges $OT Visit: 1 Visit OT Evaluation $OT Eval Moderate Complexity: 1 Mod Karysa Heft, OTR/L  12/13/23, 11:18 AM  Jacey Pelc E Starla Deller 12/13/2023, 11:15 AM

## 2023-12-13 NOTE — NC FL2 (Signed)
[place] MEDICAID FL2 LEVEL OF CARE FORM     IDENTIFICATION  Patient Name: Tanya Ho Birthdate: May 11, 1926 Sex: female Admission Date (Current Location): 12/11/2023  [facility] and [place] Number:  Chiropodist and Address:  [facility], [address], [place], [place] [address]      Provider Number: [id number]  Attending Physician Name and Address:  Arnetha Courser, MD  Relative Name and Phone Number:  Tiffany Kocher (Daughter)  [phone]    Current Level of Care: Hospital Recommended Level of Care: Skilled Nursing Facility Prior Approval Number:    Date Approved/Denied:   PASRR Number: [id number]  Discharge Plan: SNF    Current Diagnoses: Patient Active Problem List   Diagnosis Date Noted   Acute gastroenteritis 12/12/2023   Insomnia/Anxiety 12/12/2023   Chronic prescription benzodiazepine use 12/12/2023   Venous insufficiency of both lower extremities 12/12/2023   Hypokalemia 12/12/2023   Acute CVA (cerebrovascular accident) (HCC) 12/11/2023   Hypertensive urgency 12/11/2023   Chronic anticoagulation 12/11/2023   Personal history of falls 12/11/2023   Hx of fracture of left hip 06/19/2023   Benzodiazepine dependence (HCC) 06/19/2023   Dysuria 03/15/2023   Acute urinary retention 03/15/2023   Closed left hip fracture, initial encounter (HCC) 03/12/2023   Permanent atrial fibrillation (HCC) 03/12/2023   Stage 3a chronic kidney disease (CKD) (HCC) 03/12/2023   Chronic heart failure with preserved ejection fraction (HFpEF) (HCC) 03/12/2023   Hyperglycemia 03/12/2023   SOB (shortness of breath)    Coronary artery disease    Palpitations    Fatigue    Back pain    Knee pain    Hyperlipidemia    Bruises easily    OA (osteoarthritis)    Obesity    Hypertension     Orientation RESPIRATION BLADDER Height & Weight     Time, Self, Situation, Place  Normal Incontinent Weight: 184 lb 1.4 oz (83.5 kg) Height:  5\' 6"  (167.6 cm)   BEHAVIORAL SYMPTOMS/MOOD NEUROLOGICAL BOWEL NUTRITION STATUS      Continent    AMBULATORY STATUS COMMUNICATION OF NEEDS Skin   Extensive Assist Verbally Normal                       Personal Care Assistance Level of Assistance  Bathing, Feeding, Dressing Bathing Assistance: Maximum assistance Feeding assistance: Maximum assistance Dressing Assistance: Maximum assistance     Functional Limitations Info             SPECIAL CARE FACTORS FREQUENCY  PT (By licensed PT), OT (By licensed OT)     PT Frequency: 5 times week OT Frequency: 5 times a week            Contractures Contractures Info: Not present    Additional Factors Info  Code Status, Allergies Code Status Info: DNR-limited Allergies Info: Codeine  Demerol (Meperidine Hcl)  Morphine And Codeine           Current Medications (12/13/2023):  This is the current hospital active medication list Current Facility-Administered Medications  Medication Dose Route Frequency Provider Last Rate Last Admin   acetaminophen (TYLENOL) tablet 650 mg  650 mg Oral Q4H PRN Andris Baumann, MD   650 mg at 12/12/23 2104   Or   acetaminophen (TYLENOL) 160 MG/5ML solution 650 mg  650 mg Per Tube Q4H PRN Andris Baumann, MD       Or   acetaminophen (TYLENOL) suppository 650 mg  650 mg Rectal Q4H PRN Lindajo Royal  V, MD       ALPRAZolam Prudy Feeler) tablet 0.5 mg  0.5 mg Oral QHS PRN Lindajo Royal V, MD   0.5 mg at 12/12/23 2100   apixaban (ELIQUIS) tablet 5 mg  5 mg Oral BID Andris Baumann, MD   5 mg at 12/13/23 1610   pneumococcal 20-valent conjugate vaccine (PREVNAR 20) injection 0.5 mL  0.5 mL Intramuscular Tomorrow-1000 Delfino Lovett, MD       senna-docusate (Senokot-S) tablet 1 tablet  1 tablet Oral QHS PRN Andris Baumann, MD         Discharge Medications: Please see discharge summary for a list of discharge medications.  Relevant Imaging Results:  Relevant Lab Results:   Additional Information SSN: [id number].  Pt  is vaccinated for covid with one booster.  Dannielle Baskins Genelle Gather, LCSW

## 2023-12-13 NOTE — Progress Notes (Signed)
*  PRELIMINARY RESULTS* Echocardiogram 2D Echocardiogram has been performed.  [name] 12/13/2023, 2:18 PM

## 2023-12-13 NOTE — Evaluation (Signed)
Physical Therapy Evaluation Patient Details Name: Tanya Ho MRN: [id number] DOB: May 22, 1926 Today's Date: 12/13/2023  History of Present Illness  Tanya Ho is a 89 y.o. female with medical history significant for hypertension, nonobstructive CAD, DM, CKD llla, chronic diastolic CHF with chronic lower extremity edema (last seen by cardiology 10/2023), permanent atrial fibrillation on Eliquis, history of falls, hip fracture  in March 2024 s/p repair and ambulant with walker at baseline, who presents via EMS with weakness and numbness left arm. MRI reveals acute infarct in the right medial medulla near the pontomedullary  junction.   Clinical Impression  Patient received in bed, she is reporting her neck hurts. Patient not oriented to place, situation. She required mod/max A for supine to sit. Max +2 for sit to stand and max +2 for stand pivot to [facility] then to recliner. Patient has no functional use of L UE. She will continue to benefit from skilled PT to improve functional independence and strength.        If plan is discharge home, recommend the following: Two people to help with walking and/or transfers;A lot of help with bathing/dressing/bathroom   Can travel by private vehicle   No    Equipment Recommendations None recommended by PT;Other (comment) (TBD)  Recommendations for Other Services       Functional Status Assessment Patient has had a recent decline in their functional status and demonstrates the ability to make significant improvements in function in a reasonable and predictable amount of time.     Precautions / Restrictions Precautions Precautions: Fall Restrictions Weight Bearing Restrictions Per Provider Order: No      Mobility  Bed Mobility Overal bed mobility: Needs Assistance Bed Mobility: Supine to Sit     Supine to sit: Mod assist, +2 for physical assistance, HOB elevated          Transfers Overall transfer level: Needs assistance Equipment used: 2  person hand held assist Transfers: Sit to/from Stand, Bed to chair/wheelchair/BSC Sit to Stand: Max assist, +2 physical assistance Stand pivot transfers: Max assist, +2 physical assistance              Ambulation/Gait               General Gait Details: unable to ambulate  Stairs            Wheelchair Mobility     Tilt Bed    Modified Rankin (Stroke Patients Only)       Balance Overall balance assessment: Needs assistance Sitting-balance support: Feet supported Sitting balance-Leahy Scale: Poor     Standing balance support: Bilateral upper extremity supported, During functional activity Standing balance-Leahy Scale: Zero                               Pertinent Vitals/Pain Pain Assessment Pain Assessment: No/denies pain    Home Living Family/patient expects to be discharged to:: Skilled nursing facility                        Prior Function Prior Level of Function : Patient poor historian/Family not available             Mobility Comments: was using RW for ambulation prior to hip fracture in March, unsure of mobility since. ADLs Comments: Requires assistance.     Extremity/Trunk Assessment   Upper Extremity Assessment Upper Extremity Assessment: LUE deficits/detail;Defer to OT evaluation LUE Coordination: decreased  gross motor    Lower Extremity Assessment Lower Extremity Assessment: Defer to PT evaluation;Generalized weakness;LLE deficits/detail;Difficult to assess due to impaired cognition LLE Deficits / Details: demonstrates weakness of L LE during  mobility. Requires assist to position left leg and keep it from sliding forward during transfer    Cervical / Trunk Assessment Cervical / Trunk Assessment: Normal  Communication   Communication Communication: Hearing impairment Cueing Techniques: Verbal cues;Gestural cues;Tactile cues  Cognition Arousal: Lethargic Behavior During Therapy: Flat affect Overall  Cognitive Status: Impaired/Different from baseline Area of Impairment: Orientation, Following commands, Awareness, Problem solving, Safety/judgement                 Orientation Level: Disoriented to, Place, Time, Situation     Following Commands: Follows one step commands inconsistently, Follows one step commands with increased time Safety/Judgement: Decreased awareness of safety Awareness: Intellectual Problem Solving: Slow processing, Decreased initiation, Difficulty sequencing, Requires verbal cues, Requires tactile cues          General Comments      Exercises     Assessment/Plan    PT Assessment Patient needs continued PT services  PT Problem List Decreased strength;Decreased range of motion;Decreased activity tolerance;Decreased balance;Decreased mobility;Decreased coordination;Decreased cognition;Decreased knowledge of use of DME;Decreased safety awareness;Decreased knowledge of precautions;Impaired sensation;Obesity       PT Treatment Interventions DME instruction;Functional mobility training;Therapeutic activities;Patient/family education;Balance training;Therapeutic exercise;Neuromuscular re-education;Cognitive remediation    PT Goals (Current goals can be found in the Care Plan section)  Acute Rehab PT Goals Patient Stated Goal: none stated PT Goal Formulation: Patient unable to participate in goal setting Time For Goal Achievement: 12/18/23    Frequency Min 1X/week     Co-evaluation PT/OT/SLP Co-Evaluation/Treatment: Yes Reason for Co-Treatment: For patient/therapist safety;To address functional/ADL transfers;Necessary to address cognition/behavior during functional activity PT goals addressed during session: Mobility/safety with mobility;Balance         AM-PAC PT "6 Clicks" Mobility  Outcome Measure Help needed turning from your back to your side while in a flat bed without using bedrails?: A Lot Help needed moving from lying on your back to  sitting on the side of a flat bed without using bedrails?: A Lot Help needed moving to and from a bed to a chair (including a wheelchair)?: Total Help needed standing up from a chair using your arms (e.g., wheelchair or bedside chair)?: Total Help needed to walk in hospital room?: Total Help needed climbing 3-5 steps with a railing? : Total 6 Click Score: 8    End of Session   Activity Tolerance: Patient limited by lethargy Patient left: in chair;with call bell/phone within reach Nurse Communication: Mobility status PT Visit Diagnosis: Other abnormalities of gait and mobility (R26.89);Muscle weakness (generalized) (M62.81);Hemiplegia and hemiparesis Hemiplegia - Right/Left: Left Hemiplegia - dominant/non-dominant: Non-dominant Hemiplegia - caused by: Cerebral infarction    Time: 4098-1191 PT Time Calculation (min) (ACUTE ONLY): 22 min   Charges:   PT Evaluation $PT Eval Moderate Complexity: 1 Mod   PT General Charges $$ ACUTE PT VISIT: 1 Visit         Iesha Summerhill, PT, GCS 12/13/23,9:47 AM

## 2023-12-13 NOTE — Assessment & Plan Note (Signed)
Suspect related to GI illness in combination with Lasix to treat Lower extremity swelling Resolved today Replete potassium as needed and monitor

## 2023-12-13 NOTE — Progress Notes (Signed)
Progress Note   Patient: Tanya Ho ZOX:[id number] DOB: 1926-07-22 DOA: 12/11/2023     2 DOS: the patient was seen and examined on 12/13/2023   Brief hospital course: Taken from H&P.  EULETA ONI is a 89 y.o. female with medical history significant for hypertension, nonobstructive CAD, DM, CKD llla, chronic diastolic CHF with chronic lower extremity edema (last seen by cardiology 10/2023), permanent atrial fibrillation on Eliquis, history of falls, hip fracture  in March 2024 s/p repair and ambulant with walker at baseline, who presents via EMS with weakness and numbness left arm. Patient recently had 1 day of illness with several bouts of nausea, vomiting and diarrhea which resolved by 10 PM a day prior to admission.  She woke up around 3 AM and having left arm weakness so EMS was called.  On presentation she had elevated blood pressure at 188/81, labs mostly stable with mild hypokalemia at 3.2, INR 1.5. EKG, personally viewed and interpreted showing A-fib at 65 with no ischemic ST-T wave changes CT head nonacute.  Showing age-related atrophy and chronic small vessel ischemic changes as well as unchanged extensive calcification in the carotid siphons and middle cerebral arteries.  MRI showed acute infarct in right medial medulla near the pontomedullary junction.  CTA head and neck shows several areas of stenosis-please see the full report.  Neurology was consulted and patient was admitted for stroke workup.  12/25: Blood pressure mildly elevated at 151/73-having permissive hypertension due to recent stroke.  Labs with potassium of 3.2, mild leukocytosis at 10.6 likely reactive, lipid panel with HDL of 32 and LDL of 130.  Pending echocardiogram, PT and OT evaluation. Neurology is recommending adding baby aspirin due to significant atherosclerosis, needed discussion with patient and family as patient is already on Eliquis.  12/26: Hemodynamically stable, slowly improving left-sided deficit.   PT and OT are recommending SNF, pending echocardiogram. [facility] will take her tomorrow   Assessment and Plan: * Acute CVA (cerebrovascular accident) (HCC) Chronic anticoagulation Patient is persistent left-sided deficit Permissive hypertension for first 24-48 hrs post stroke onset: Prn Labetalol IV or Vasotec IV If BP greater than 220/120  Statins for LDL goal less than 70 Continue Eliquis.  Neurology is recommending adding low-dose aspirin but need a discussion about risk and benefit as she is already on Eliquis MRI and CTA head and neck showing acute infarct right medial medulla and multiple areas of stenosis. Continuous cardiac monitoring PT and OT are recommending SNF Pending echocardiogram,  TOC consult as patient might need a higher level of care-[facility] resides [facility] Not doing antiplatelet as patient is already on Eliquis   Hypertensive urgency Blood pressure remained mildly elevated. We initially held home antihypertensives due to permissive hypertension with recent stroke. -Restarting home medications today  Hypokalemia Suspect related to GI illness in combination with Lasix to treat Lower extremity swelling Resolved today Replete potassium as needed and monitor  Acute gastroenteritis Patient had a self-limiting 1 illness on 12/23 with vomiting and diarrhea which resolved prior to Onset of her stroke symptoms Currently denies nausea, vomiting or diarrhea Oral hydration encouraged Monitor for recurrent symptoms  Chronic heart failure with preserved ejection fraction (HFpEF) (HCC) Chronic lower extremity edema Clinically euvolemic, though with chronic lower extremity edema, suspect venous insufficiency Hold Lasix for permissive hypertension  Permanent atrial fibrillation (HCC) Continue Eliquis  Restarting metoprolol  Coronary artery disease Followed by cardiology, last seen in November Holding metoprolol and losartan for permissive  hypertension Continue apixaban and  nitroglycerin Statin not seen on med list  Insomnia/Anxiety Chronic benzodiazepine use Resuming Home Xanax at lower dose of 0.5 mg nightly as needed Monitor for withdrawal which patient has had before(hallucinations), per daughter  Personal history of falls Ambulates with walker at baseline Had hip fracture in March 2024.Had another fall in October 2024 PT OT eval   Subjective: Patient was sitting comfortably in chair when seen today.  No new deficit.  She wants to go back to her place.  Physical Exam: Vitals:   12/13/23 0521 12/13/23 0749 12/13/23 1141 12/13/23 1242  BP: (!) 180/85 (!) 165/85 (!) 170/80 (!) 170/80  Pulse: 89 (!) 102 (!) 110 (!) 110  Resp: 20 18 18    Temp: 97.8 F (36.6 C) 98.1 F (36.7 C) 98.2 F (36.8 C)   TempSrc: Oral     SpO2: 95% 95% 97%   Weight:      Height:       General.  Frail elderly lady, in no acute distress. Pulmonary.  Lungs clear bilaterally, normal respiratory effort. CV.  Regular rate and rhythm, no JVD, rub or murmur. Abdomen.  Soft, nontender, nondistended, BS positive. CNS.  Alert and oriented .  2/5 in left upper and 3+/5 in left lower extremity Extremities.  No edema, no cyanosis, pulses intact and symmetrical.   Data Reviewed: Prior data reviewed  Family Communication: Tried calling daughter with no response  Disposition: Status is: Inpatient Remains inpatient appropriate because: Severity of illness  Planned Discharge Destination: Skilled nursing facility  DVT prophylaxis.  Eliquis Time spent: 45 minutes  This record has been created using Conservation officer, historic buildings. Errors have been sought and corrected,but may not always be located. Such creation errors do not reflect on the standard of care.   Author: Arnetha Courser, MD 12/13/2023 2:37 PM  For on call review [email or web address].

## 2023-12-13 NOTE — Assessment & Plan Note (Signed)
Blood pressure remained mildly elevated. We initially held home antihypertensives due to permissive hypertension with recent stroke. -Restarting home medications today

## 2023-12-13 NOTE — Progress Notes (Addendum)
SLP Cancellation Note  Patient Details Name: Tanya Ho MRN: [id number] DOB: 1926/03/30   Cancelled treatment:       Reason Eval/Treat Not Completed:  (chart reviewed; consulted NSG/other discipline)   Pt admitted w/ weakness and numbness in LUE and recent vomiting/diarrhea at [facility]. MRI: right medullary acute infarct likely secondary to small vessel disease per Neurology note(who also indicated in their note that pt was "Alert, oriented, thought content appropriate. Speech fluent without evidence of aphasia. Able to follow 3 step commands without difficulty". Pt is significantly HOH and requires some repetition to hear/follow through w/ tasks/questions.  Per pt chart notes, MD note on 10/05/2023 -- "Patient with history of auditory Hallucinations, now having visual Hallucinations. No obvious signs of infection. Recent fall, however, Hallucinations began prior. Visual Hallucinations likely related to underlying Memory Changes. Benzodiazepine dependence when missed or late dose increases Hallucinations. Continue education for Nursing and staff for timing of medication.".   Pt consumed small sips of thin liquids (water via straw on table) w/ No overt s/s of aspiration noted. Pt also answered basic questions re: herself and followed 1-step directions. Recommend Pills in a Puree for safer swallowing. NSG updated.   In setting of pt having Baseline Memory Changes as per MD note, and potential medication impact w/ Hallucinations, AND HOH at Baseline, recommend any f/u for cognitive-linguistic assessment/tx needs be had at her venue of care([facility]) in a setting where she is comfortable and others know her Baseline. No acute ST needs currently; f/u at her SNF w/ ST services if indicated. MD updated, agreed.    Jerilynn Som, MS, CCC-SLP Speech Language Pathologist Rehab Services; [facility] [phone] (ascom) Deziah Renwick 12/13/2023, 2:25 PM

## 2023-12-13 NOTE — TOC Progression Note (Signed)
Transition of Care [facility]) - Progression Note    Patient Details  Name: Tanya Ho MRN: [id number] Date of Birth: [birth year]  Transition of Care [facility] CM/SW Contact  Allena Katz, LCSW Phone Number: 12/13/2023, 12:06 PM  Clinical Narrative:   Sue Lush at [facility] reports that pt can admit to their SNF tomorrow.          Expected Discharge Plan and Services                                               Social Determinants of Health (SDOH) Interventions SDOH Screenings   Food Insecurity: No Food Insecurity (12/12/2023)  Housing: Unknown (12/12/2023)  Transportation Needs: No Transportation Needs (12/12/2023)  Utilities: Not At Risk (12/12/2023)  Tobacco Use: Low Risk  (12/11/2023)    Readmission Risk Interventions     No data to display

## 2023-12-13 NOTE — Assessment & Plan Note (Signed)
Continue Eliquis  Restarting metoprolol

## 2023-12-14 DIAGNOSIS — I639 Cerebral infarction, unspecified: Secondary | ICD-10-CM | POA: Diagnosis not present

## 2023-12-14 DIAGNOSIS — Z23 Encounter for immunization: Secondary | ICD-10-CM | POA: Diagnosis not present

## 2023-12-14 DIAGNOSIS — I69354 Hemiplegia and hemiparesis following cerebral infarction affecting left non-dominant side: Secondary | ICD-10-CM | POA: Diagnosis not present

## 2023-12-14 DIAGNOSIS — I4821 Permanent atrial fibrillation: Secondary | ICD-10-CM | POA: Diagnosis not present

## 2023-12-14 DIAGNOSIS — E1159 Type 2 diabetes mellitus with other circulatory complications: Secondary | ICD-10-CM | POA: Diagnosis not present

## 2023-12-14 DIAGNOSIS — I6381 Other cerebral infarction due to occlusion or stenosis of small artery: Secondary | ICD-10-CM | POA: Diagnosis not present

## 2023-12-14 DIAGNOSIS — B351 Tinea unguium: Secondary | ICD-10-CM | POA: Diagnosis not present

## 2023-12-14 DIAGNOSIS — I10 Essential (primary) hypertension: Secondary | ICD-10-CM | POA: Diagnosis not present

## 2023-12-14 DIAGNOSIS — I2510 Atherosclerotic heart disease of native coronary artery without angina pectoris: Secondary | ICD-10-CM | POA: Diagnosis not present

## 2023-12-14 DIAGNOSIS — N1831 Chronic kidney disease, stage 3a: Secondary | ICD-10-CM | POA: Diagnosis not present

## 2023-12-14 DIAGNOSIS — Z7401 Bed confinement status: Secondary | ICD-10-CM | POA: Diagnosis not present

## 2023-12-14 DIAGNOSIS — E440 Moderate protein-calorie malnutrition: Secondary | ICD-10-CM | POA: Diagnosis not present

## 2023-12-14 DIAGNOSIS — R29898 Other symptoms and signs involving the musculoskeletal system: Secondary | ICD-10-CM

## 2023-12-14 DIAGNOSIS — A0472 Enterocolitis due to Clostridium difficile, not specified as recurrent: Secondary | ICD-10-CM | POA: Diagnosis not present

## 2023-12-14 DIAGNOSIS — R278 Other lack of coordination: Secondary | ICD-10-CM | POA: Diagnosis not present

## 2023-12-14 DIAGNOSIS — F4322 Adjustment disorder with anxiety: Secondary | ICD-10-CM | POA: Diagnosis not present

## 2023-12-14 DIAGNOSIS — G4700 Insomnia, unspecified: Secondary | ICD-10-CM | POA: Diagnosis not present

## 2023-12-14 DIAGNOSIS — Z9181 History of falling: Secondary | ICD-10-CM | POA: Diagnosis not present

## 2023-12-14 DIAGNOSIS — R2689 Other abnormalities of gait and mobility: Secondary | ICD-10-CM | POA: Diagnosis not present

## 2023-12-14 DIAGNOSIS — Z0000 Encounter for general adult medical examination without abnormal findings: Secondary | ICD-10-CM | POA: Diagnosis not present

## 2023-12-14 DIAGNOSIS — Z66 Do not resuscitate: Secondary | ICD-10-CM | POA: Diagnosis not present

## 2023-12-14 DIAGNOSIS — I69398 Other sequelae of cerebral infarction: Secondary | ICD-10-CM | POA: Diagnosis not present

## 2023-12-14 DIAGNOSIS — I5032 Chronic diastolic (congestive) heart failure: Secondary | ICD-10-CM | POA: Diagnosis not present

## 2023-12-14 DIAGNOSIS — F5101 Primary insomnia: Secondary | ICD-10-CM | POA: Diagnosis not present

## 2023-12-14 DIAGNOSIS — R202 Paresthesia of skin: Principal | ICD-10-CM

## 2023-12-14 DIAGNOSIS — Z79899 Other long term (current) drug therapy: Secondary | ICD-10-CM | POA: Diagnosis not present

## 2023-12-14 DIAGNOSIS — Z7901 Long term (current) use of anticoagulants: Secondary | ICD-10-CM | POA: Diagnosis not present

## 2023-12-14 DIAGNOSIS — F1320 Sedative, hypnotic or anxiolytic dependence, uncomplicated: Secondary | ICD-10-CM | POA: Diagnosis not present

## 2023-12-14 DIAGNOSIS — I160 Hypertensive urgency: Secondary | ICD-10-CM | POA: Diagnosis not present

## 2023-12-14 DIAGNOSIS — E876 Hypokalemia: Secondary | ICD-10-CM | POA: Diagnosis not present

## 2023-12-14 DIAGNOSIS — K529 Noninfective gastroenteritis and colitis, unspecified: Secondary | ICD-10-CM | POA: Diagnosis not present

## 2023-12-14 DIAGNOSIS — E1122 Type 2 diabetes mellitus with diabetic chronic kidney disease: Secondary | ICD-10-CM | POA: Diagnosis not present

## 2023-12-14 DIAGNOSIS — I509 Heart failure, unspecified: Secondary | ICD-10-CM | POA: Diagnosis not present

## 2023-12-14 DIAGNOSIS — I872 Venous insufficiency (chronic) (peripheral): Secondary | ICD-10-CM | POA: Diagnosis not present

## 2023-12-14 DIAGNOSIS — G8194 Hemiplegia, unspecified affecting left nondominant side: Secondary | ICD-10-CM | POA: Diagnosis not present

## 2023-12-14 DIAGNOSIS — I130 Hypertensive heart and chronic kidney disease with heart failure and stage 1 through stage 4 chronic kidney disease, or unspecified chronic kidney disease: Secondary | ICD-10-CM | POA: Diagnosis not present

## 2023-12-14 DIAGNOSIS — M6281 Muscle weakness (generalized): Secondary | ICD-10-CM | POA: Diagnosis not present

## 2023-12-14 MED ORDER — HYDRALAZINE HCL 50 MG PO TABS: 25.0000 mg | ORAL_TABLET | Freq: Three times a day (TID) | ORAL | Status: DC

## 2023-12-14 MED ORDER — ROSUVASTATIN CALCIUM 10 MG PO TABS: 10.0000 mg | ORAL_TABLET | Freq: Every day | ORAL | Status: DC

## 2023-12-14 MED ORDER — ROSUVASTATIN CALCIUM 10 MG PO TABS
10.0000 mg | ORAL_TABLET | Freq: Every day | ORAL | Status: DC
  Administered 2023-12-14: 10 mg via ORAL
  Filled 2023-12-14: qty 1

## 2023-12-14 MED ORDER — ALPRAZOLAM 0.5 MG PO TABS: 0.5000 mg | ORAL_TABLET | Freq: Every evening | ORAL | 0 refills | Status: DC | PRN

## 2023-12-14 MED ORDER — HYDRALAZINE HCL 25 MG PO TABS: 25.0000 mg | ORAL_TABLET | Freq: Two times a day (BID) | ORAL | Status: DC

## 2023-12-14 NOTE — Discharge Summary (Signed)
Physician Discharge Summary   Patient: Tanya Ho MRN: [id number] DOB: [birth year]  Admit date:     12/11/2023  Discharge date: 12/14/23  Discharge Physician: Arnetha Courser   PCP: [name], MD   Recommendations at discharge:  Please obtain CBC and BMP on follow-up Follow-up with neurology as outpatient Follow-up with primary care provider She is started on hydralazine-might need titration.  Discharge Diagnoses: Principal Problem:   Acute CVA (cerebrovascular accident) [facility]) Active Problems:   Chronic anticoagulation   Hypertensive urgency   Acute gastroenteritis   Hypokalemia   Chronic heart failure with preserved ejection fraction (HFpEF) (HCC)   Permanent atrial fibrillation (HCC)   Coronary artery disease   Insomnia/Anxiety   Personal history of falls   Chronic prescription benzodiazepine use   Venous insufficiency of both lower extremities   Paresthesia   Left arm weakness  Resolved Problems:   * No resolved hospital problems. [facility] Course: Taken from H&P.  Tanya Ho is a 89 y.o. female with medical history significant for hypertension, nonobstructive CAD, DM, CKD llla, chronic diastolic CHF with chronic lower extremity edema (last seen by cardiology 10/2023), permanent atrial fibrillation on Eliquis, history of falls, hip fracture  in March 2024 s/p repair and ambulant with walker at baseline, who presents via EMS with weakness and numbness left arm. Patient recently had 1 day of illness with several bouts of nausea, vomiting and diarrhea which resolved by 10 PM a day prior to admission.  She woke up around 3 AM and having left arm weakness so EMS was called.  On presentation she had elevated blood pressure at 188/81, labs mostly stable with mild hypokalemia at 3.2, INR 1.5. EKG, personally viewed and interpreted showing A-fib at 65 with no ischemic ST-T wave changes CT head nonacute.  Showing age-related atrophy and chronic small vessel ischemic  changes as well as unchanged extensive calcification in the carotid siphons and middle cerebral arteries.  MRI showed acute infarct in right medial medulla near the pontomedullary junction.  CTA head and neck shows several areas of stenosis-please see the full report.  Neurology was consulted and patient was admitted for stroke workup.  12/25: Blood pressure mildly elevated at 151/73-having permissive hypertension due to recent stroke.  Labs with potassium of 3.2, mild leukocytosis at 10.6 likely reactive, lipid panel with HDL of 32 and LDL of 130.  Pending echocardiogram, PT and OT evaluation. Neurology is recommending adding baby aspirin due to significant atherosclerosis, needed discussion with patient and family as patient is already on Eliquis.  12/26: Hemodynamically stable, slowly improving left-sided deficit.  PT and OT are recommending SNF, echocardiogram with EF of 50 to 55%, no regional wall motion abnormalities and indeterminate diastolic parameter.  Mildly elevated pulmonary arterial pressure and mildly dilated right ventricle.  Moderately dilated right atrium.  12/27: Patient remained hemodynamically stable.  No new deficit.  Stable left sided deficit.  Blood pressure remained elevated despite restarting home antihypertensives so low-dose hydralazine was added and her PCP can titrate as appropriate.  Patient was not started on any antiplatelet as she will continue on Eliquis.  She was started on Crestor.  Patient also need to follow-up with outpatient neurology for stroke follow-up.  Patient will continue on current medications and need to have a close follow-up with her providers for further management.   Assessment and Plan: * Acute CVA (cerebrovascular accident) (HCC) Chronic anticoagulation Patient is persistent left-sided deficit Permissive hypertension for first 24-48 hrs post stroke onset: Prn  Labetalol IV or Vasotec IV If BP greater than 220/120  Statins for LDL goal less  than 70 Continue Eliquis.  Neurology is recommending adding low-dose aspirin but need a discussion about risk and benefit as she is already on Eliquis MRI and CTA head and neck showing acute infarct right medial medulla and multiple areas of stenosis. Continuous cardiac monitoring PT and OT are recommending SNF Pending echocardiogram,  TOC consult as patient might need a higher level of care-currently resides at [facility] Not doing antiplatelet as patient is already on Eliquis   Hypertensive urgency Blood pressure remained mildly elevated. We initially held home antihypertensives due to permissive hypertension with recent stroke. -Restarting home medications today  Hypokalemia Suspect related to GI illness in combination with Lasix to treat Lower extremity swelling Resolved today Replete potassium as needed and monitor  Acute gastroenteritis Patient had a self-limiting 1 illness on 12/23 with vomiting and diarrhea which resolved prior to Onset of her stroke symptoms Currently denies nausea, vomiting or diarrhea Oral hydration encouraged Monitor for recurrent symptoms  Chronic heart failure with preserved ejection fraction (HFpEF) (HCC) Chronic lower extremity edema Clinically euvolemic, though with chronic lower extremity edema, suspect venous insufficiency Hold Lasix for permissive hypertension  Permanent atrial fibrillation (HCC) Continue Eliquis  Restarting metoprolol  Coronary artery disease Followed by cardiology, last seen in November Holding metoprolol and losartan for permissive hypertension Continue apixaban and nitroglycerin Statin not seen on med list  Insomnia/Anxiety Chronic benzodiazepine use Resuming Home Xanax at lower dose of 0.5 mg nightly as needed Monitor for withdrawal which patient has had before(hallucinations), per daughter  Personal history of falls Ambulates with walker at baseline Had hip fracture in March 2024.Had another fall in October  2024 PT OT eval   Pain control - [place] Controlled Substance Reporting System database was reviewed. and patient was instructed, not to drive, operate heavy machinery, perform activities at heights, swimming or participation in water activities or provide baby-sitting services while on Pain, Sleep and Anxiety Medications; until their outpatient Physician has advised to do so again. Also recommended to not to take more than prescribed Pain, Sleep and Anxiety Medications.  Consultants: Neurology Procedures performed: None Disposition: Skilled nursing facility Diet recommendation:  Discharge Diet Orders (From admission, onward)     Start     Ordered   12/14/23 0000  Diet - low sodium heart healthy        12/14/23 1028           Cardiac and Carb modified diet DISCHARGE MEDICATION: Allergies as of 12/14/2023       Reactions   Codeine Other (See Comments)   Unknown reaction   Demerol [meperidine Hcl] Other (See Comments)   Unknown reaction   Morphine And Codeine Other (See Comments)   Unknown reaction        Medication List     TAKE these medications    acetaminophen 325 MG tablet Commonly known as: TYLENOL Take 650 mg by mouth every 6 (six) hours as needed.   ALPRAZolam 0.5 MG tablet Commonly known as: XANAX Take 1 tablet (0.5 mg total) by mouth at bedtime as needed for anxiety or sleep. What changed:  medication strength how much to take when to take this reasons to take this   apixaban 5 MG Tabs tablet Commonly known as: Eliquis Take 1 tablet (5 mg total) by mouth 2 (two) times daily.   estradiol 0.1 MG/GM vaginal cream Commonly known as: ESTRACE Place 1 Applicatorful vaginally.  At bedtime every Tuesday, Friday, and Sunday.   famotidine 20 MG tablet Commonly known as: PEPCID Take 20 mg by mouth at bedtime.   furosemide 40 MG tablet Commonly known as: LASIX Take 40 mg by mouth daily. Can give extra 40 mg tablet as needed for weight gain of 3 lbs  overnight or 6 lbs in a week.   hydrALAZINE 25 MG tablet Commonly known as: APRESOLINE Take 1 tablet (25 mg total) by mouth 2 (two) times daily. What changed: when to take this   hydrocortisone 25 MG suppository Commonly known as: ANUSOL-HC Place 25 mg rectally every 12 (twelve) hours as needed for hemorrhoids.   losartan 50 MG tablet Commonly known as: COZAAR Take 50 mg by mouth daily.   metoprolol tartrate 100 MG tablet Commonly known as: LOPRESSOR Take 100 mg by mouth 2 (two) times daily.   metoprolol tartrate 50 MG tablet Commonly known as: LOPRESSOR Take 50 mg by mouth daily as needed (Palpitations).   nitroGLYCERIN 0.4 MG SL tablet Commonly known as: NITROSTAT Place 1 tablet (0.4 mg total) under the tongue every 5 (five) minutes as needed for chest pain.   ondansetron 4 MG tablet Commonly known as: ZOFRAN Take 4 mg by mouth every 8 (eight) hours as needed for nausea or vomiting.   polyethylene glycol 17 g packet Commonly known as: MIRALAX / GLYCOLAX Take 17 g by mouth daily as needed.   rosuvastatin 10 MG tablet Commonly known as: CRESTOR Take 1 tablet (10 mg total) by mouth daily. Start taking on: December 15, 2023   traMADol 50 MG tablet Commonly known as: ULTRAM Take 1 tablet (50 mg total) by mouth every 6 (six) hours as needed. Give 2 tablets by mouth every 6 hours as needed for severe pain.   Vitamin D-3 25 MCG (1000 UT) Caps One by mouth daily.   Zinc Oxide 12.8 % ointment Commonly known as: TRIPLE PASTE Apply 1 Application topically. To peri area and skin folds topically twice daily        Follow-up Information     Beamer, Victoria, MD Follow up.   Specialty: Family Medicine Why: Hospital follow up Contact information: [address] [place] [address] [phone]                Discharge Exam: Filed Weights   12/12/23 0344  Weight: 83.5 kg   General.  Frail elderly lady, in no acute distress. Pulmonary.  Lungs clear  bilaterally, normal respiratory effort. CV.  Regular rate and rhythm, no JVD, rub or murmur. Abdomen.  Soft, nontender, nondistended, BS positive. CNS.  Alert and oriented .  2/5 on left upper and 3/5 on left lower extremity.  No new deficit Extremities.  No edema, no cyanosis, pulses intact and symmetrical.  Condition at discharge: stable  The results of significant diagnostics from this hospitalization (including imaging, microbiology, ancillary and laboratory) are listed below for reference.   Imaging Studies: ECHOCARDIOGRAM COMPLETE Result Date: 12/13/2023    ECHOCARDIOGRAM REPORT   Patient Name:   Tanya Ho Date of Exam: 12/13/2023 Medical Rec #:  [id number]    Height:       66.0 in Accession #:    [id number]   Weight:       18 4.1 lb Date of Birth:  1926-09-05     BSA:          1.931 m Patient Age:    89 years     BP:  170/80 mmHg Patient Gender: F            HR:           68 bpm. Exam Location:  [facility] Procedure: 2D Echo, Cardiac Doppler and Color Doppler Indications:     Stroke  History:         Patient has prior history of Echocardiogram examinations, most                  recent 03/14/2018. CHF, CAD, Stroke, Arrythmias:Atrial                  Fibrillation, Signs/Symptoms:Fatigue and Shortness of Breath;                  Risk Factors:Hypertension and Dyslipidemia. CKD.  Sonographer:     Mikki Harbor Referring Phys:  [id number] Andris Baumann Diagnosing Phys: Julien Nordmann MD IMPRESSIONS  1. Left ventricular ejection fraction, by estimation, is 50 to 55%. Left ventricular ejection fraction by PLAX is 55 %. The left ventricle has low normal function. The left ventricle has no regional wall motion abnormalities. There is mild left ventricular hypertrophy. Left ventricular diastolic parameters are indeterminate.  2. Right ventricular systolic function is normal. The right ventricular size is mildly enlarged. There is mildly elevated pulmonary artery systolic pressure. The estimated right  ventricular systolic pressure is 38.0 mmHg.  3. Right atrial size was moderately dilated.  4. The mitral valve is normal in structure. Mild to moderate mitral valve regurgitation. No evidence of mitral stenosis.  5. Tricuspid valve regurgitation is moderate.  6. The aortic valve is normal in structure. There is mild calcification of the aortic valve. Aortic valve regurgitation is mild. Aortic valve sclerosis/calcification is present, without any evidence of aortic stenosis. Aortic valve mean gradient measures 4.0 mmHg.  7. The inferior vena cava is normal in size with greater than 50% respiratory variability, suggesting right atrial pressure of 3 mmHg. FINDINGS  Left Ventricle: Left ventricular ejection fraction, by estimation, is 50 to 55%. Left ventricular ejection fraction by PLAX is 55 %. The left ventricle has low normal function. The left ventricle has no regional wall motion abnormalities. The left ventricular internal cavity size was normal in size. There is mild left ventricular hypertrophy. Left ventricular diastolic parameters are indeterminate. Right Ventricle: The right ventricular size is mildly enlarged. No increase in right ventricular wall thickness. Right ventricular systolic function is normal. There is mildly elevated pulmonary artery systolic pressure. The tricuspid regurgitant velocity is 2.74 m/s, and with an assumed right atrial pressure of 8 mmHg, the estimated right ventricular systolic pressure is 38.0 mmHg. Left Atrium: Left atrial size was normal in size. Right Atrium: Right atrial size was moderately dilated. Pericardium: There is no evidence of pericardial effusion. Mitral Valve: The mitral valve is normal in structure. Mild mitral annular calcification. Mild to moderate mitral valve regurgitation. No evidence of mitral valve stenosis. MV peak gradient, 4.8 mmHg. The mean mitral valve gradient is 1.0 mmHg. Tricuspid Valve: The tricuspid valve is normal in structure. Tricuspid valve  regurgitation is moderate . No evidence of tricuspid stenosis. Aortic Valve: The aortic valve is normal in structure. There is mild calcification of the aortic valve. Aortic valve regurgitation is mild. Aortic valve sclerosis/calcification is present, without any evidence of aortic stenosis. Aortic valve mean gradient measures 4.0 mmHg. Aortic valve peak gradient measures 7.6 mmHg. Aortic valve area, by VTI measures 2.05 cm. Pulmonic Valve: The pulmonic valve was normal in structure. Pulmonic  valve regurgitation is mild. No evidence of pulmonic stenosis. Aorta: The aortic root is normal in size and structure. Venous: The inferior vena cava is normal in size with greater than 50% respiratory variability, suggesting right atrial pressure of 3 mmHg. IAS/Shunts: No atrial level shunt detected by color flow Doppler.  LEFT VENTRICLE PLAX 2D LV EF:         Left ventricular ejection fraction by PLAX is 55 %. LVIDd:         4.20 cm LVIDs:         3.00 cm LV PW:         1.20 cm LV IVS:        1.20 cm LVOT diam:     2.00 cm LV SV:         65 LV SV Index:   34 LVOT Area:     3.14 cm  RIGHT VENTRICLE RV Basal diam:  4.30 cm RV Mid diam:    3.90 cm RV S prime:     8.92 cm/s LEFT ATRIUM             Index        RIGHT ATRIUM           Index LA diam:        4.00 cm 2.07 cm/m   RA Area:     28.30 cm LA Vol (A2C):   63.7 ml 33.00 ml/m  RA Volume:   100.00 ml 51.80 ml/m LA Vol (A4C):   58.1 ml 30.10 ml/m LA Biplane Vol: 63.3 ml 32.79 ml/m  AORTIC VALVE                    PULMONIC VALVE AV Area (Vmax):    1.97 cm     PV Vmax:       0.72 m/s AV Area (Vmean):   1.84 cm     PV Peak grad:  2.1 mmHg AV Area (VTI):     2.05 cm AV Vmax:           137.50 cm/s AV Vmean:          92.450 cm/s AV VTI:            0.318 m AV Peak Grad:      7.6 mmHg AV Mean Grad:      4.0 mmHg LVOT Vmax:         86.30 cm/s LVOT Vmean:        54.200 cm/s LVOT VTI:          0.208 m LVOT/AV VTI ratio: 0.65  AORTA Ao Root diam: 3.30 cm Ao Asc diam:  3.60 cm  MITRAL VALVE                TRICUSPID VALVE MV Area (PHT): 3.66 cm     TR Peak grad:   30.0 mmHg MV Area VTI:   2.61 cm     TR Vmax:        274.00 cm/s MV Peak grad:  4.8 mmHg MV Mean grad:  1.0 mmHg     SHUNTS MV Vmax:       1.10 m/s     Systemic VTI:  0.21 m MV Vmean:      46.5 cm/s    Systemic Diam: 2.00 cm MV Decel Time: 207 msec MV E velocity: 110.00 cm/s Julien Nordmann MD Electronically signed by Julien Nordmann MD Signature Date/Time: 12/13/2023/2:31:14 PM    Final    CT Angio  Head Neck W WO CM Result Date: 12/11/2023 CLINICAL DATA:  Left hand tingling and weakness, left arm numbness; acute infarct in the medulla on same-day MRI EXAM: CT ANGIOGRAPHY HEAD AND NECK WITH AND WITHOUT CONTRAST TECHNIQUE: Multidetector CT imaging of the head and neck was performed using the standard protocol during bolus administration of intravenous contrast. Multiplanar CT image reconstructions and MIPs were obtained to evaluate the vascular anatomy. Carotid stenosis measurements (when applicable) are obtained utilizing NASCET criteria, using the distal internal carotid diameter as the denominator. RADIATION DOSE REDUCTION: This exam was performed according to the departmental dose-optimization program which includes automated exposure control, adjustment of the mA and/or kV according to patient size and/or use of iterative reconstruction technique. CONTRAST:  75mL OMNIPAQUE IOHEXOL 350 MG/ML SOLN COMPARISON:  12/11/2023 CT head, 12/11/2023 MRI head; no prior CTA available FINDINGS: CT HEAD FINDINGS For noncontrast findings, please see same day CT head. CTA NECK FINDINGS Aortic arch: Two-vessel arch with a common origin of the brachiocephalic and left common carotid arteries. Imaged portion shows no evidence of aneurysm or dissection. 50% stenosis at the origin of the left common carotid artery (series 7, image 88). 30% stenosis at the origin of the left subclavian artery (series 7, image 98). 40% stenosis at the origin of  the brachiocephalic artery (series 6, image 284). Right carotid system: 50% stenosis in the proximal right ICA (series 6, image 194). No evidence of dissection. Left carotid system: 60% stenosis in the proximal left ICA (series 6, image 193). No evidence of dissection. Vertebral arteries: The right vertebral artery is occluded at its origin (series 6, image 271) and throughout the V1, V2, and V3 segments, with minimal likely retrograde opacification in the distal right V3 (series 6, image 154). Moderate stenosis at the origin of the left vertebral artery (series 7, image 102). The left vertebral artery is otherwise patent to the skull base, without significant stenosis or evidence of dissection. Skeleton: No acute osseous abnormality. Degenerative changes in the cervical spine. Other neck: No acute finding. Upper chest: No focal pulmonary opacity or pleural effusion. Review of the MIP images confirms the above findings CTA HEAD FINDINGS Anterior circulation: Both internal carotid arteries are patent to the termini, with mild stenosis in the bilateral cavernous and supraclinoid segments. Patent left A1. Short-segment nonopacification of the proximal right A1 (series 6, image 97). Opacification of the distal right A1 may be retrograde. Normal anterior communicating artery. Anterior cerebral arteries are patent to their distal aspects without significant stenosis. Multifocal stenosis in the M1 segments bilaterally, which is severe in the distal left M 1 (series 6, image 98) and moderate in the right M 1 (series 6, image 100). Additional multifocal narrowing in the M2 segment (series 6, image 95 and 101). Distal MCA branches are irregular but appear grossly perfused. Posterior circulation: The left vertebral artery is patent to the vertebrobasilar junction. The right vertebral artery is patent, with diminished, likely retrograde opacification. Posterior inferior cerebellar arteries patent proximally. Basilar patent to  its distal aspect without significant stenosis. Superior cerebellar arteries patent proximally. Patent P1 segments, particularly hypoplastic on the left. Near fetal origin of the left PCA from the left posterior communicating artery, which may have severe stenosis at its origin (series 6, image 106). Severe stenosis in the proximal right P2 (series 6, image 98). The remainder of the PCAs are irregular but without focal stenosis Venous sinuses: As permitted by contrast timing, patent. Anatomic variants: Near fetal origin of the left PCA. No  evidence of aneurysm or vascular malformation. Review of the MIP images confirms the above findings IMPRESSION: 1. Occlusion of the right vertebral artery at its origin and throughout the V1, V2, and V3 segments, with minimal likely retrograde opacification in the distal right V3 and in the V4 segment. This is of indeterminate acuity but is favored to be chronic. 2. Short-segment nonopacification of the proximal right A1, which may be due to severe stenosis or occlusion. Opacification of the distal right A1 may be retrograde. 3. Severe stenosis in the distal left M1 and moderate stenosis in the right M1. Additional multifocal narrowing in the M2 segments bilaterally. Distal MCA branches are irregular but appear grossly perfused. 4. Near fetal origin of the left PCA from the left posterior communicating artery, which may have severe stenosis at its origin. Severe stenosis in the proximal right P2. 5. 50% stenosis in the proximal right ICA and 60% stenosis in the proximal left ICA. 6. 50% stenosis at the origin of the left common carotid artery, 30% stenosis at the origin of the left subclavian artery, and 40% stenosis at the origin of the brachiocephalic artery. Electronically Signed   By: Wiliam Ke M.D.   On: 12/11/2023 23:12   MR BRAIN WO CONTRAST Result Date: 12/11/2023 CLINICAL DATA:  Left hand tingling and weakness, left arm numbness EXAM: MRI HEAD WITHOUT CONTRAST  TECHNIQUE: Multiplanar, multiecho pulse sequences of the brain and surrounding structures were obtained without intravenous contrast. COMPARISON:  No prior MRI available, correlation is made with 12/11/2023 CT head FINDINGS: Brain: Restricted diffusion with ADC correlate in the right medial medulla near the pontomedullary junction (series 5, image 12 and series 13, image 18). No acute hemorrhage, mass, mass effect, or midline shift. No hydrocephalus or extra-axial collection. Partial empty sella. Normal craniocervical junction. No hemosiderin deposition to suggest remote hemorrhage. Age related cerebral atrophy. Remote lacunar infarcts in the bilateral basal ganglia and right thalamus. Vascular: Somewhat diminished signal in the right vertebral artery. Otherwise normal arterial flow voids. Skull and upper cervical spine: Normal marrow signal. Sinuses/Orbits: Clear paranasal sinuses. Status post bilateral lens replacements. Other: Trace fluid in left mastoid air cells. IMPRESSION: 1. Acute infarct in the right medial medulla near the pontomedullary junction. 2. Somewhat diminished signal in the right vertebral artery, which could be due to slow flow or occlusion. Consider further evaluation with CTA. These results were called by telephone at the time of interpretation on 12/11/2023 at 10:10 pm to provider [facility] , who verbally acknowledged these results. Electronically Signed   By: Wiliam Ke M.D.   On: 12/11/2023 22:10   CT HEAD WO CONTRAST Result Date: 12/11/2023 CLINICAL DATA:  Neuro deficit, acute, stroke suspected. Tingling and weakness of the left arm and hand. EXAM: CT HEAD WITHOUT CONTRAST TECHNIQUE: Contiguous axial images were obtained from the base of the skull through the vertex without intravenous contrast. RADIATION DOSE REDUCTION: This exam was performed according to the departmental dose-optimization program which includes automated exposure control, adjustment of the mA and/or kV according  to patient size and/or use of iterative reconstruction technique. COMPARISON:  03/12/2023 FINDINGS: Brain: No focal abnormality affects the brainstem or cerebellum. Cerebral hemispheres show generalized age related atrophy with chronic small-vessel ischemic changes of the white matter, thalami and basal ganglia. No sign of acute infarction, mass lesion, hemorrhage, hydrocephalus or extra-axial collection. Vascular: Extensive calcification associated with the carotid siphons and middle cerebral arteries. Similar to the prior study. Skull: Normal Sinuses/Orbits: Clear/normal Other: None IMPRESSION: No  acute CT finding. Age related atrophy with chronic small-vessel ischemic changes of the white matter, thalami and basal ganglia. Extensive calcification associated with the carotid siphons and middle cerebral arteries, unchanged from the prior exam. Electronically Signed   By: Paulina Fusi M.D.   On: 12/11/2023 16:20    Microbiology: Results for orders placed or performed in visit on 05/02/23  Microscopic Examination     Status: Abnormal   Collection Time: 05/02/23  4:30 PM   Urine  Result Value Ref Range Status   WBC, UA >30 (A) 0 - 5 /hpf Final   RBC, Urine 0-2 0 - 2 /hpf Final   Epithelial Cells (non renal) 0-10 0 - 10 /hpf Final   Casts Present (A) None seen /lpf Final   Cast Type Hyaline casts N/A Final   Mucus, UA Present (A) Not Estab. Final   Bacteria, UA Moderate (A) None seen/Few Final   Yeast, UA Present (A) None seen Final  CULTURE, URINE COMPREHENSIVE     Status: Abnormal   Collection Time: 05/02/23  4:55 PM   Specimen: Urine   UR  Result Value Ref Range Status   Urine Culture, Comprehensive Final report (A)  Final   Organism ID, Bacteria Candida kefyr (A)  Final    Comment: 50,000-100,000 colony forming units per mL Susceptibility not normally performed on this organism.     Labs: CBC: Recent Labs  Lab 12/11/23 1601 12/12/23 0605  WBC 9.4 10.6*  NEUTROABS 6.7  --   HGB  14.2 13.7  HCT 43.2 40.3  MCV 92.9 89.6  PLT 347 342   Basic Metabolic Panel: Recent Labs  Lab 12/11/23 1601 12/12/23 0604 12/12/23 0605 12/13/23 0943  NA 136  --  137 136  K 3.2*  --  3.2* 3.6  CL 98  --  99 101  CO2 26  --  27 26  GLUCOSE 139*  --  116* 145*  BUN 14  --  15 12  CREATININE 0.73  --  0.91 0.69  CALCIUM 9.1  --  8.8* 8.9  MG  --  2.0  --   --    Liver Function Tests: Recent Labs  Lab 12/11/23 1601  AST 22  ALT 10  ALKPHOS 74  BILITOT 1.0  PROT 7.1  ALBUMIN 3.4*   CBG: No results for input(s): "GLUCAP" in the last 168 hours.  Discharge time spent: greater than 30 minutes.  This record has been created using Conservation officer, historic buildings. Errors have been sought and corrected,but may not always be located. Such creation errors do not reflect on the standard of care.   Signed: Arnetha Courser, MD [facility] 12/14/2023

## 2023-12-14 NOTE — Care Management Important Message (Signed)
Important Message  Patient Details  Name: LUDMILLA MARRISON MRN: [id number] Date of Birth: 1926-08-21   Important Message Given:  Yes - Medicare IM     Sherilyn Banker 12/14/2023, 1:38 PM

## 2023-12-14 NOTE — TOC Transition Note (Addendum)
Transition of Care [facility]) - Discharge Note   Patient Details  Name: LARISHA FERRING MRN: [id number] Date of Birth: 05/10/1926  [facility]) CM/SW Contact:  Allena Katz, LCSW Phone Number: 12/14/2023, 10:40 AM   Clinical Narrative:   Pt to discharge to [facility]. RN given number for report. DC summary sent. Daughter notified.    Final next level of care: Skilled Nursing Facility Barriers to Discharge: Barriers Resolved   Patient Goals and CMS Choice Patient states their goals for this hospitalization and ongoing recovery are:: go to [facility] CMS Medicare.gov Compare Post Acute Care list provided to:: Patient        Discharge Placement              Patient chooses bed at: [facility] Patient to be transferred to facility by: [facility]   Patient and family notified of of transfer: 12/14/23  Discharge Plan and Services Additional resources added to the After Visit Summary for                                       Social Drivers of Health (SDOH) Interventions SDOH Screenings   Food Insecurity: No Food Insecurity (12/12/2023)  Housing: Unknown (12/12/2023)  Transportation Needs: No Transportation Needs (12/12/2023)  Utilities: Not At Risk (12/12/2023)  Tobacco Use: Low Risk  (12/11/2023)     Readmission Risk Interventions     No data to display

## 2023-12-14 NOTE — Plan of Care (Signed)
  Problem: Education: Goal: Knowledge of disease or condition will improve Outcome: Progressing   Problem: Ischemic Stroke/TIA Tissue Perfusion: Goal: Complications of ischemic stroke/TIA will be minimized Outcome: Progressing   Problem: Coping: Goal: Will verbalize positive feelings about self Outcome: Progressing   Problem: Health Behavior/Discharge Planning: Goal: Ability to manage health-related needs will improve Outcome: Progressing   Problem: Self-Care: Goal: Ability to participate in self-care as condition permits will improve Outcome: Progressing   Problem: Nutrition: Goal: Risk of aspiration will decrease Outcome: Progressing   Problem: Education: Goal: Knowledge of General Education information will improve Description: Including pain rating scale, medication(s)/side effects and non-pharmacologic comfort measures Outcome: Progressing   Problem: Health Behavior/Discharge Planning: Goal: Ability to manage health-related needs will improve Outcome: Progressing   Problem: Activity: Goal: Risk for activity intolerance will decrease Outcome: Progressing   Problem: Coping: Goal: Level of anxiety will decrease Outcome: Progressing   Problem: Elimination: Goal: Will not experience complications related to bowel motility Outcome: Progressing   Problem: Safety: Goal: Ability to remain free from injury will improve Outcome: Progressing   Problem: Skin Integrity: Goal: Risk for impaired skin integrity will decrease Outcome: Progressing

## 2023-12-14 NOTE — Progress Notes (Signed)
Patient was discharged to [facility] room 419  via EMS. I called report to [phone] and spoke with Allayne Stack, the oncoming nurse assuming care. Patient was sent with all belongings including a pillow, blanket and one bag of clothes.

## 2023-12-17 ENCOUNTER — Encounter: Payer: Self-pay | Admitting: Adult Health

## 2023-12-17 ENCOUNTER — Non-Acute Institutional Stay (SKILLED_NURSING_FACILITY): Payer: Self-pay | Admitting: Adult Health

## 2023-12-17 DIAGNOSIS — I10 Essential (primary) hypertension: Secondary | ICD-10-CM

## 2023-12-17 DIAGNOSIS — I5032 Chronic diastolic (congestive) heart failure: Secondary | ICD-10-CM | POA: Diagnosis not present

## 2023-12-17 DIAGNOSIS — I2510 Atherosclerotic heart disease of native coronary artery without angina pectoris: Secondary | ICD-10-CM

## 2023-12-17 DIAGNOSIS — I4821 Permanent atrial fibrillation: Secondary | ICD-10-CM

## 2023-12-17 DIAGNOSIS — I69354 Hemiplegia and hemiparesis following cerebral infarction affecting left non-dominant side: Secondary | ICD-10-CM

## 2023-12-17 DIAGNOSIS — F5101 Primary insomnia: Secondary | ICD-10-CM | POA: Diagnosis not present

## 2023-12-17 DIAGNOSIS — E876 Hypokalemia: Secondary | ICD-10-CM | POA: Diagnosis not present

## 2023-12-17 NOTE — Progress Notes (Signed)
Location:  Other Nursing Home Room Number: [facility]) Place of Service:  SNF (31) Provider:  Kenard Gower, DNP, FNP-BC  Patient Care Team: [name], MD as PCP - General (Family Medicine) [name], MD (Inactive) as PCP - Cardiology (Cardiology) [name], MD as PCP - Electrophysiology (Cardiology)  Extended Emergency Contact Information Primary Emergency Contact: Tanya Ho [place] of [place] Home Phone: [phone] Relation: Daughter Secondary Emergency Contact: Juniel,Danny Mobile Phone: [phone] Relation: Son  Code Status:  DNR  Goals of care: Advanced Directive information    12/17/2023   10:13 AM  Advanced Directives  Does Patient Have a Medical Advance Directive? Yes  Type of Advance Directive Out of facility DNR (pink MOST or yellow form)  Does patient want to make changes to medical advance directive? No - Patient declined  Would patient like information on creating a medical advance directive? No - Patient declined  Pre-existing out of facility DNR order (yellow form or pink MOST form) Yellow form placed in chart (order not valid for inpatient use)     Chief Complaint  Patient presents with   Acute Visit    follow up hospitalization     HPI:  Pt is a 89 y.o. female seen today to follow up hospitalization. She was hospitalized 12/24 to 12/14/23 for acute CVA.  She has a PMH of hypertension, nonobstructive CAD, diabetes mellitus, chronic kidney disease stage III 8, chronic diastolic CHF with chronic lower extremity edema and permanent atrial fibrillation on Eliquis.  He was brought to the hospital with numbness of left arm.  CT head was nonacute showing age-related atrophy and chronic small vessel ischemic changes as well as unchanged extensive calcification in the carotid siphons and middle cerebral arteries.  MRI showed acute infarcts in the right medial medulla near the pontomedullary  junction.  CTA head and neck shows several areas of stenosis.  Neurology was consulted.  Xanax 1 mg Q HS was decreased to 0.5 mg Q HS PRN. Patient and family requested for xanax to be ordered as she was taking before, 1 mg Q HS for insomnia. Patient stated that she has been on it for years.  Past Medical History:  Diagnosis Date   Back pain    Bruises easily    Coronary artery disease    NONOBSTRUCTIVE   Fatigue    Hyperlipidemia    Hypertension    Knee pain    OA (osteoarthritis)    Obesity    Palpitations    SOB (shortness of breath)    Past Surgical History:  Procedure Laterality Date   BREAST BIOPSY     LEFT BREAST   CARDIAC CATHETERIZATION  05/11/2006   OVERALL CARDIAC SIZE AND SILHOUETTE ARE NORMAL. EF ESTIMATED 60%   INTRAMEDULLARY (IM) NAIL INTERTROCHANTERIC Left 03/12/2023   Procedure: INTRAMEDULLARY (IM) NAIL INTERTROCHANTERIC;  Surgeon: Roby Lofts, MD;  Location: [facility] OR;  Service: Orthopedics;  Laterality: Left;   KIDNEY STONES  1982   KNEE ARTHROSCOPY  09/22/2008   VAGINAL HYSTERECTOMY      Allergies  Allergen Reactions   Codeine Other (See Comments)    Unknown reaction   Demerol [Meperidine Hcl] Other (See Comments)    Unknown reaction   Morphine And Codeine Other (See Comments)    Unknown reaction    Outpatient Encounter Medications as of 12/17/2023  Medication Sig   acetaminophen (TYLENOL) 325 MG tablet Take 650 mg by mouth every 6 (six) hours as needed.  ALPRAZolam (XANAX) 0.5 MG tablet Take 1 tablet (0.5 mg total) by mouth at bedtime as needed for anxiety or sleep.   apixaban (ELIQUIS) 5 MG TABS tablet Take 1 tablet (5 mg total) by mouth 2 (two) times daily.   Cholecalciferol (VITAMIN D-3) 25 MCG (1000 UT) CAPS One by mouth daily.   estradiol (ESTRACE) 0.1 MG/GM vaginal cream Place 1 Applicatorful vaginally. At bedtime every Tuesday, Friday, and Sunday.   famotidine (PEPCID) 20 MG tablet Take 20 mg by mouth at bedtime.   furosemide (LASIX) 40 MG  tablet Take 40 mg by mouth daily. Can give extra 40 mg tablet as needed for weight gain of 3 lbs overnight or 6 lbs in a week.   hydrALAZINE (APRESOLINE) 25 MG tablet Take 1 tablet (25 mg total) by mouth 2 (two) times daily.   hydrocortisone (ANUSOL-HC) 25 MG suppository Place 25 mg rectally every 12 (twelve) hours as needed for hemorrhoids.   losartan (COZAAR) 50 MG tablet Take 50 mg by mouth daily.   metoprolol tartrate (LOPRESSOR) 100 MG tablet Take 100 mg by mouth 2 (two) times daily.   metoprolol tartrate (LOPRESSOR) 50 MG tablet Take 50 mg by mouth daily as needed (Palpitations).   nitroGLYCERIN (NITROSTAT) 0.4 MG SL tablet Place 1 tablet (0.4 mg total) under the tongue every 5 (five) minutes as needed for chest pain.   ondansetron (ZOFRAN) 4 MG tablet Take 4 mg by mouth every 8 (eight) hours as needed for nausea or vomiting.   polyethylene glycol (MIRALAX / GLYCOLAX) 17 g packet Take 17 g by mouth daily as needed.   rosuvastatin (CRESTOR) 10 MG tablet Take 1 tablet (10 mg total) by mouth daily.   traMADol (ULTRAM) 50 MG tablet Take 1 tablet (50 mg total) by mouth every 6 (six) hours as needed. Give 2 tablets by mouth every 6 hours as needed for severe pain.   Zinc Oxide (TRIPLE PASTE) 12.8 % ointment Apply 1 Application topically. To peri area and skin folds topically twice daily   No facility-administered encounter medications on file as of 12/17/2023.    Review of Systems  Constitutional:  Negative for appetite change, chills, fatigue and fever.  HENT:  Negative for congestion, hearing loss, rhinorrhea and sore throat.   Eyes: Negative.   Respiratory:  Negative for cough, shortness of breath and wheezing.   Cardiovascular:  Negative for chest pain, palpitations and leg swelling.  Gastrointestinal:  Negative for abdominal pain, constipation, diarrhea, nausea and vomiting.  Genitourinary:  Negative for dysuria.  Musculoskeletal:  Negative for arthralgias, back pain and myalgias.   Skin:  Negative for color change, rash and wound.  Neurological:  Negative for dizziness, weakness and headaches.  Psychiatric/Behavioral:  Negative for behavioral problems. The patient is not nervous/anxious.      Immunization History  Administered Date(s) Administered   Influenza, High Dose Seasonal PF 11/23/2022   Influenza-Unspecified 10/10/2023   PFIZER(Purple Top)SARS-COV-2 Vaccination 01/07/2020, 01/27/2020, 11/18/2020   PNEUMOCOCCAL CONJUGATE-20 12/14/2023   Pertinent  Health Maintenance Due  Topic Date Due   DEXA SCAN  Never done   INFLUENZA VACCINE  Completed       No data to display           Vitals:   12/17/23 1003  BP: 138/64  Pulse: 62  Resp: 18  Temp: 97.6 F (36.4 C)  SpO2: 93%  Weight: 177 lb 6.4 oz (80.5 kg)  Height: 5\' 6"  (1.676 m)   Body mass index is 28.63 kg/m.  Physical Exam Constitutional:      General: She is not in acute distress.    Appearance: Normal appearance.  HENT:     Head: Normocephalic and atraumatic.     Nose: Nose normal.     Mouth/Throat:     Mouth: Mucous membranes are moist.  Eyes:     Conjunctiva/sclera: Conjunctivae normal.  Cardiovascular:     Rate and Rhythm: Normal rate and regular rhythm.  Pulmonary:     Effort: Pulmonary effort is normal.     Breath sounds: Normal breath sounds.  Abdominal:     General: Bowel sounds are normal.     Palpations: Abdomen is soft.  Musculoskeletal:     Cervical back: Normal range of motion.     Left lower leg: Edema present.     Comments: Has LUE and LLE 2+edema  Skin:    General: Skin is warm and dry.  Neurological:     Motor: Weakness present.     Comments: Has left-sided weakness  Psychiatric:        Mood and Affect: Mood normal.        Behavior: Behavior normal.        Labs reviewed: Recent Labs    12/11/23 1601 12/12/23 0604 12/12/23 0605 12/13/23 0943  NA 136  --  137 136  K 3.2*  --  3.2* 3.6  CL 98  --  99 101  CO2 26  --  27 26  GLUCOSE 139*  --   116* 145*  BUN 14  --  15 12  CREATININE 0.73  --  0.91 0.69  CALCIUM 9.1  --  8.8* 8.9  MG  --  2.0  --   --    Recent Labs    03/13/23 0156 03/14/23 0217 03/29/23 0000 12/11/23 1601  AST 29 20 15 22   ALT 13 10 6* 10  ALKPHOS 40 40 155* 74  BILITOT 0.4 0.8  --  1.0  PROT 5.8* 5.7*  --  7.1  ALBUMIN 2.8* 2.7* 2.7* 3.4*   Recent Labs    03/15/23 0242 03/15/23 0926 04/18/23 0000 04/30/23 0000 07/05/23 0000 10/22/23 0000 12/11/23 1601 12/12/23 0605  WBC 13.9*   < > 9.2 8.3   < > 7.3 9.4 10.6*  NEUTROABS  --    < > 6,201.00 4,930.00  --   --  6.7  --   HGB 8.8*   < > 12.2 12.8   < > 14.1 14.2 13.7  HCT 26.0*   < > 38 40   < > 44 43.2 40.3  MCV 93.2  --   --   --   --   --  92.9 89.6  PLT 250   < > 342 413*   < > 270 347 342   < > = values in this interval not displayed.   Lab Results  Component Value Date   TSH 4.35 08/27/2023   Lab Results  Component Value Date   HGBA1C 6.5 (H) 12/12/2023   Lab Results  Component Value Date   CHOL 185 12/12/2023   HDL 32 (L) 12/12/2023   LDLCALC 130 (H) 12/12/2023   TRIG 117 12/12/2023   CHOLHDL 5.8 12/12/2023    Significant Diagnostic Results in last 30 days:  ECHOCARDIOGRAM COMPLETE Result Date: 12/13/2023    ECHOCARDIOGRAM REPORT   Patient Name:   Tanya Ho Date of Exam: 12/13/2023 Medical Rec #:  [id number]    Height:  66.0 in Accession #:    [id number]   Weight:       184.1 lb Date of Birth:  [birth year]     BSA:          1.931 m Patient Age:    89 years     BP:           170/80 mmHg Patient Gender: F            HR:           68 bpm. Exam Location:  [facility] Procedure: 2D Echo, Cardiac Doppler and Color Doppler Indications:     Stroke  History:         Patient has prior history of Echocardiogram examinations, most                  recent 03/14/2018. CHF, CAD, Stroke, Arrythmias:Atrial                  Fibrillation, Signs/Symptoms:Fatigue and Shortness of Breath;                  Risk Factors:Hypertension and Dyslipidemia.  CKD.  Sonographer:     Mikki Harbor Referring Phys:  [id number] Andris Baumann Diagnosing Phys: Julien Nordmann MD IMPRESSIONS  1. Left ventricular ejection fraction, by estimation, is 50 to 55%. Left ventricular ejection fraction by PLAX is 55 %. The left ventricle has low normal function. The left ventricle has no regional wall motion abnormalities. There is mild left ventricular hypertrophy. Left ventricular diastolic parameters are indeterminate.  2. Right ventricular systolic function is normal. The right ventricular size is mildly enlarged. There is mildly elevated pulmonary artery systolic pressure. The estimated right ventricular systolic pressure is 38.0 mmHg.  3. Right atrial size was moderately dilated.  4. The mitral valve is normal in structure. Mild to moderate mitral valve regurgitation. No evidence of mitral stenosis.  5. Tricuspid valve regurgitation is moderate.  6. The aortic valve is normal in structure. There is mild calcification of the aortic valve. Aortic valve regurgitation is mild. Aortic valve sclerosis/calcification is present, without any evidence of aortic stenosis. Aortic valve mean gradient measures 4.0 mmHg.  7. The inferior vena cava is normal in size with greater than 50% respiratory variability, suggesting right atrial pressure of 3 mmHg. FINDINGS  Left Ventricle: Left ventricular ejection fraction, by estimation, is 50 to 55%. Left ventricular ejection fraction by PLAX is 55 %. The left ventricle has low normal function. The left ventricle has no regional wall motion abnormalities. The left ventricular internal cavity size was normal in size. There is mild left ventricular hypertrophy. Left ventricular diastolic parameters are indeterminate. Right Ventricle: The right ventricular size is mildly enlarged. No increase in right ventricular wall thickness. Right ventricular systolic function is normal. There is mildly elevated pulmonary artery systolic pressure. The tricuspid  regurgitant velocity is 2.74 m/s, and with an assumed right atrial pressure of 8 mmHg, the estimated right ventricular systolic pressure is 38.0 mmHg. Left Atrium: Left atrial size was normal in size. Right Atrium: Right atrial size was moderately dilated. Pericardium: There is no evidence of pericardial effusion. Mitral Valve: The mitral valve is normal in structure. Mild mitral annular calcification. Mild to moderate mitral valve regurgitation. No evidence of mitral valve stenosis. MV peak gradient, 4.8 mmHg. The mean mitral valve gradient is 1.0 mmHg. Tricuspid Valve: The tricuspid valve is normal in structure. Tricuspid valve regurgitation is moderate . No evidence of tricuspid stenosis. Aortic Valve: The  aortic valve is normal in structure. There is mild calcification of the aortic valve. Aortic valve regurgitation is mild. Aortic valve sclerosis/calcification is present, without any evidence of aortic stenosis. Aortic valve mean gradient measures 4.0 mmHg. Aortic valve peak gradient measures 7.6 mmHg. Aortic valve area, by VTI measures 2.05 cm. Pulmonic Valve: The pulmonic valve was normal in structure. Pulmonic valve regurgitation is mild. No evidence of pulmonic stenosis. Aorta: The aortic root is normal in size and structure. Venous: The inferior vena cava is normal in size with greater than 50% respiratory variability, suggesting right atrial pressure of 3 mmHg. IAS/Shunts: No atrial level shunt detected by color flow Doppler.  LEFT VENTRICLE PLAX 2D LV EF:         Left ventricular ejection fraction by PLAX is 55 %. LVIDd:         4.20 cm LVIDs:         3.00 cm LV PW:         1.20 cm LV IVS:        1.20 cm LVOT diam:     2.00 cm LV SV:         65 LV SV Index:   34 LVOT Area:     3.14 cm  RIGHT VENTRICLE RV Basal diam:  4.30 cm RV Mid diam:    3.90 cm RV S prime:     8.92 cm/s LEFT ATRIUM             Index        RIGHT ATRIUM           Index LA diam:        4.00 cm 2.07 cm/m   RA Area:     28.30 cm LA  Vol (A2C):   63.7 ml 33.00 ml/m  RA Volume:   100.00 ml 51.80 ml/m LA Vol (A4C):   58.1 ml 30.10 ml/m LA Biplane Vol: 63.3 ml 32.79 ml/m  AORTIC VALVE                    PULMONIC VALVE AV Area (Vmax):    1.97 cm     PV Vmax:       0.72 m/s AV Area (Vmean):   1.84 cm     PV Peak grad:  2.1 mmHg AV Area (VTI):     2.05 cm AV Vmax:           137.50 cm/s AV Vmean:          92.450 cm/s AV VTI:            0.318 m AV Peak Grad:      7.6 mmHg AV Mean Grad:      4.0 mmHg LVOT Vmax:         86.30 cm/s LVOT Vmean:        54.200 cm/s LVOT VTI:          0.208 m LVOT/AV VTI ratio: 0.65  AORTA Ao Root diam: 3.30 cm Ao Asc diam:  3.60 cm MITRAL VALVE                TRICUSPID VALVE MV Area (PHT): 3.66 cm     TR Peak grad:   30.0 mmHg MV Area VTI:   2.61 cm     TR Vmax:        274.00 cm/s MV Peak grad:  4.8 mmHg MV Mean grad:  1.0 mmHg     SHUNTS MV Vmax:  1.10 m/s     Systemic VTI:  0.21 m MV Vmean:      46.5 cm/s    Systemic Diam: 2.00 cm MV Decel Time: 207 msec MV E velocity: 110.00 cm/s Julien Nordmann MD Electronically signed by Julien Nordmann MD Signature Date/Time: 12/13/2023/2:31:14 PM    Final    CT Angio Head Neck W WO CM Result Date: 12/11/2023 CLINICAL DATA:  Left hand tingling and weakness, left arm numbness; acute infarct in the medulla on same-day MRI EXAM: CT ANGIOGRAPHY HEAD AND NECK WITH AND WITHOUT CONTRAST TECHNIQUE: Multidetector CT imaging of the head and neck was performed using the standard protocol during bolus administration of intravenous contrast. Multiplanar CT image reconstructions and MIPs were obtained to evaluate the vascular anatomy. Carotid stenosis measurements (when applicable) are obtained utilizing NASCET criteria, using the distal internal carotid diameter as the denominator. RADIATION DOSE REDUCTION: This exam was performed according to the departmental dose-optimization program which includes automated exposure control, adjustment of the mA and/or kV according to patient size  and/or use of iterative reconstruction technique. CONTRAST:  75mL OMNIPAQUE IOHEXOL 350 MG/ML SOLN COMPARISON:  12/11/2023 CT head, 12/11/2023 MRI head; no prior CTA available FINDINGS: CT HEAD FINDINGS For noncontrast findings, please see same day CT head. CTA NECK FINDINGS Aortic arch: Two-vessel arch with a common origin of the brachiocephalic and left common carotid arteries. Imaged portion shows no evidence of aneurysm or dissection. 50% stenosis at the origin of the left common carotid artery (series 7, image 88). 30% stenosis at the origin of the left subclavian artery (series 7, image 98). 40% stenosis at the origin of the brachiocephalic artery (series 6, image 284). Right carotid system: 50% stenosis in the proximal right ICA (series 6, image 194). No evidence of dissection. Left carotid system: 60% stenosis in the proximal left ICA (series 6, image 193). No evidence of dissection. Vertebral arteries: The right vertebral artery is occluded at its origin (series 6, image 271) and throughout the V1, V2, and V3 segments, with minimal likely retrograde opacification in the distal right V3 (series 6, image 154). Moderate stenosis at the origin of the left vertebral artery (series 7, image 102). The left vertebral artery is otherwise patent to the skull base, without significant stenosis or evidence of dissection. Skeleton: No acute osseous abnormality. Degenerative changes in the cervical spine. Other neck: No acute finding. Upper chest: No focal pulmonary opacity or pleural effusion. Review of the MIP images confirms the above findings CTA HEAD FINDINGS Anterior circulation: Both internal carotid arteries are patent to the termini, with mild stenosis in the bilateral cavernous and supraclinoid segments. Patent left A1. Short-segment nonopacification of the proximal right A1 (series 6, image 97). Opacification of the distal right A1 may be retrograde. Normal anterior communicating artery. Anterior cerebral  arteries are patent to their distal aspects without significant stenosis. Multifocal stenosis in the M1 segments bilaterally, which is severe in the distal left M 1 (series 6, image 98) and moderate in the right M 1 (series 6, image 100). Additional multifocal narrowing in the M2 segment (series 6, image 95 and 101). Distal MCA branches are irregular but appear grossly perfused. Posterior circulation: The left vertebral artery is patent to the vertebrobasilar junction. The right vertebral artery is patent, with diminished, likely retrograde opacification. Posterior inferior cerebellar arteries patent proximally. Basilar patent to its distal aspect without significant stenosis. Superior cerebellar arteries patent proximally. Patent P1 segments, particularly hypoplastic on the left. Near fetal origin of the left  PCA from the left posterior communicating artery, which may have severe stenosis at its origin (series 6, image 106). Severe stenosis in the proximal right P2 (series 6, image 98). The remainder of the PCAs are irregular but without focal stenosis Venous sinuses: As permitted by contrast timing, patent. Anatomic variants: Near fetal origin of the left PCA. No evidence of aneurysm or vascular malformation. Review of the MIP images confirms the above findings IMPRESSION: 1. Occlusion of the right vertebral artery at its origin and throughout the V1, V2, and V3 segments, with minimal likely retrograde opacification in the distal right V3 and in the V4 segment. This is of indeterminate acuity but is favored to be chronic. 2. Short-segment nonopacification of the proximal right A1, which may be due to severe stenosis or occlusion. Opacification of the distal right A1 may be retrograde. 3. Severe stenosis in the distal left M1 and moderate stenosis in the right M1. Additional multifocal narrowing in the M2 segments bilaterally. Distal MCA branches are irregular but appear grossly perfused. 4. Near fetal origin of  the left PCA from the left posterior communicating artery, which may have severe stenosis at its origin. Severe stenosis in the proximal right P2. 5. 50% stenosis in the proximal right ICA and 60% stenosis in the proximal left ICA. 6. 50% stenosis at the origin of the left common carotid artery, 30% stenosis at the origin of the left subclavian artery, and 40% stenosis at the origin of the brachiocephalic artery. Electronically Signed   By: Wiliam Ke M.D.   On: 12/11/2023 23:12   MR BRAIN WO CONTRAST Result Date: 12/11/2023 CLINICAL DATA:  Left hand tingling and weakness, left arm numbness EXAM: MRI HEAD WITHOUT CONTRAST TECHNIQUE: Multiplanar, multiecho pulse sequences of the brain and surrounding structures were obtained without intravenous contrast. COMPARISON:  No prior MRI available, correlation is made with 12/11/2023 CT head FINDINGS: Brain: Restricted diffusion with ADC correlate in the right medial medulla near the pontomedullary junction (series 5, image 12 and series 13, image 18). No acute hemorrhage, mass, mass effect, or midline shift. No hydrocephalus or extra-axial collection. Partial empty sella. Normal craniocervical junction. No hemosiderin deposition to suggest remote hemorrhage. Age related cerebral atrophy. Remote lacunar infarcts in the bilateral basal ganglia and right thalamus. Vascular: Somewhat diminished signal in the right vertebral artery. Otherwise normal arterial flow voids. Skull and upper cervical spine: Normal marrow signal. Sinuses/Orbits: Clear paranasal sinuses. Status post bilateral lens replacements. Other: Trace fluid in left mastoid air cells. IMPRESSION: 1. Acute infarct in the right medial medulla near the pontomedullary junction. 2. Somewhat diminished signal in the right vertebral artery, which could be due to slow flow or occlusion. Consider further evaluation with CTA. These results were called by telephone at the time of interpretation on 12/11/2023 at 10:10  pm to provider [facility] , who verbally acknowledged these results. Electronically Signed   By: Wiliam Ke M.D.   On: 12/11/2023 22:10   CT HEAD WO CONTRAST Result Date: 12/11/2023 CLINICAL DATA:  Neuro deficit, acute, stroke suspected. Tingling and weakness of the left arm and hand. EXAM: CT HEAD WITHOUT CONTRAST TECHNIQUE: Contiguous axial images were obtained from the base of the skull through the vertex without intravenous contrast. RADIATION DOSE REDUCTION: This exam was performed according to the departmental dose-optimization program which includes automated exposure control, adjustment of the mA and/or kV according to patient size and/or use of iterative reconstruction technique. COMPARISON:  03/12/2023 FINDINGS: Brain: No focal abnormality affects the brainstem  or cerebellum. Cerebral hemispheres show generalized age related atrophy with chronic small-vessel ischemic changes of the white matter, thalami and basal ganglia. No sign of acute infarction, mass lesion, hemorrhage, hydrocephalus or extra-axial collection. Vascular: Extensive calcification associated with the carotid siphons and middle cerebral arteries. Similar to the prior study. Skull: Normal Sinuses/Orbits: Clear/normal Other: None IMPRESSION: No acute CT finding. Age related atrophy with chronic small-vessel ischemic changes of the white matter, thalami and basal ganglia. Extensive calcification associated with the carotid siphons and middle cerebral arteries, unchanged from the prior exam. Electronically Signed   By: Paulina Fusi M.D.   On: 12/11/2023 16:20    Assessment/Plan  1. Hemiparesis of left nondominant side as late effect of cerebral infarction (HCC) (Primary) -   MRI and CTA head and neck showing acute infarct right medial medulla and multiple areas of stenosis -   Has left-sided weakness -    Continue Eliquis and Crestor -    Continue PT and OT, for therapeutic strengthening exercises -     Fall precautions -     Follow-up with neurology  2. Hypertension, unspecified type -  BP 138/64, stable -    Continue hydralazine, metoprolol tartrate and losartan  3. Hypokalemia Lab Results  Component Value Date   K 3.6 12/13/2023    -  was repleted -  for repeat BMP  4. Chronic heart failure with preserved ejection fraction (HFpEF) (HCC) -  no SOB -  continue furosemide  5. Permanent atrial fibrillation (HCC) -    Rate controlled -    Continue Eliquis for anticoagulation and metoprolol tartrate for rate control  6. Coronary artery disease involving native coronary artery of native heart without angina pectoris -   no chest pains -    Continue Eliquis and rosuvastatin  7. Primary insomnia -  continue Xanax 0.5 mg nightly PRN and add Xanax 0.5 mg Q HS    Family/ staff Communication: Discussed plan of care with resident and charge nurse.  Labs/tests ordered:   CBC and BMP    Kenard Gower, DNP, MSN, FNP[facility] [id number] (Monday-Friday 8:00 a.m. - 5:00 p.m.) [phone] (after hours)

## 2023-12-20 LAB — CBC AND DIFFERENTIAL
HCT: 46 (ref 36–46)
Hemoglobin: 15.2 (ref 12.0–16.0)
Neutrophils Absolute: 9562
Platelets: 426 10*3/uL — AB (ref 150–400)
WBC: 12.8

## 2023-12-20 LAB — COMPREHENSIVE METABOLIC PANEL
Albumin: 3.1 — AB (ref 3.5–5.0)
Calcium: 9 (ref 8.7–10.7)
Globulin: 2.9
eGFR: 72

## 2023-12-20 LAB — HEPATIC FUNCTION PANEL
ALT: 8 U/L (ref 7–35)
AST: 15 (ref 13–35)
Alkaline Phosphatase: 110 (ref 25–125)
Bilirubin, Total: 0.6

## 2023-12-20 LAB — BASIC METABOLIC PANEL
BUN: 19 (ref 4–21)
CO2: 27 — AB (ref 13–22)
Chloride: 101 (ref 99–108)
Creatinine: 0.8 (ref 0.5–1.1)
Glucose: 113
Potassium: 3.5 meq/L (ref 3.5–5.1)
Sodium: 139 (ref 137–147)

## 2023-12-20 LAB — CBC: RBC: 4.98 (ref 3.87–5.11)

## 2023-12-21 ENCOUNTER — Encounter: Payer: Self-pay | Admitting: Student

## 2023-12-21 ENCOUNTER — Non-Acute Institutional Stay (SKILLED_NURSING_FACILITY): Payer: Self-pay | Admitting: Student

## 2023-12-21 DIAGNOSIS — I69354 Hemiplegia and hemiparesis following cerebral infarction affecting left non-dominant side: Secondary | ICD-10-CM | POA: Diagnosis not present

## 2023-12-21 DIAGNOSIS — F1320 Sedative, hypnotic or anxiolytic dependence, uncomplicated: Secondary | ICD-10-CM

## 2023-12-21 DIAGNOSIS — N1831 Chronic kidney disease, stage 3a: Secondary | ICD-10-CM

## 2023-12-21 DIAGNOSIS — I4821 Permanent atrial fibrillation: Secondary | ICD-10-CM

## 2023-12-21 DIAGNOSIS — I5032 Chronic diastolic (congestive) heart failure: Secondary | ICD-10-CM | POA: Diagnosis not present

## 2023-12-21 NOTE — Progress Notes (Signed)
 Location:  Other [place].  Nursing Home Room Number: [facility] [address] Place of Service:  SNF [phone]) Provider:  Abdul Fine, MD  Patient Care Team: Abdul Fine, MD as PCP - General (Family Medicine) Hobart Powell BRAVO, MD (Inactive) as PCP - Cardiology (Cardiology) Inocencio Soyla Lunger, MD as PCP - Electrophysiology (Cardiology)  Extended Emergency Contact Information Primary Emergency Contact: Gordon,NORMA  [place] Home Phone: [phone] Relation: Daughter Secondary Emergency Contact: Deaton,Danny Mobile Phone: [phone] Relation: Son  Code Status:  DNR Goals of care: Advanced Directive information    12/21/2023   10:34 AM  Advanced Directives  Does Patient Have a Medical Advance Directive? Yes  Type of Advance Directive Out of facility DNR (pink MOST or yellow form)  Does patient want to make changes to medical advance directive? No - Patient declined     Chief Complaint  Patient presents with   Admission.    Admission.     HPI:  Pt is a 89 y.o. female seen today for Admission to [facility]. Patient had a CVA on 12/24. She was noted ot have left sided weakness in the facility. CT scan negative for acute findings. MRI with severe stenosis and right medial medulla near the pontomedullary junction. Neurology was consulted.   Patient with persistent left sided weakness.   Addendum 12/24/2023: The patient's family member reports a significant decline in the patient's health status following a recent stroke. The patient, who is bed-bound and unable to move independently, has been experiencing severe diarrhea, with up to eight episodes daily. This has led to discomfort and embarrassment, as the patient requires assistance for each episode. Despite the patient's hesitance to ask for help, the family member has encouraged her to utilize the support available.  The patient's appetite has also been affected, with the patient often only consuming a  bite or two of food daily. The family member is concerned about the patient's nutritional intake and is advocating for supplemental nutrition, either in pill form or through a liquid diet.  The patient's anxiety has been a longstanding issue, managed with Xanax  for many years. The family member reports that the patient has been taking some form of anxiety medication for as long as she can remember. There was a recent request to increase the frequency of Xanax , which was not agreed upon. However, the family member is adamant that the patient continues to receive Xanax , particularly at night, to prevent hallucinations and ensure a good night's sleep. The family member is open to additional medications to manage the patient's anxiety, as long as the Xanax  is not discontinued.  The patient's family member is also seeking a sitter for the patient during the evening hours, from dinner until bedtime, to alleviate the patient's feelings of loneliness and anxiety. The family member is willing to arrange for this privately if necessary.   Past Medical History:  Diagnosis Date   Back pain    Bruises easily    Coronary artery disease    NONOBSTRUCTIVE   Fatigue    Hyperlipidemia    Hypertension    Knee pain    OA (osteoarthritis)    Obesity    Palpitations    SOB (shortness of breath)    Past Surgical History:  Procedure Laterality Date   BREAST BIOPSY     LEFT BREAST   CARDIAC CATHETERIZATION  05/11/2006   OVERALL CARDIAC SIZE AND SILHOUETTE ARE NORMAL. EF ESTIMATED 60%   INTRAMEDULLARY (IM) NAIL INTERTROCHANTERIC Left 03/12/2023  Procedure: INTRAMEDULLARY (IM) NAIL INTERTROCHANTERIC;  Surgeon: Kendal Franky SQUIBB, MD;  Location: [facility] OR;  Service: Orthopedics;  Laterality: Left;   KIDNEY STONES  1982   KNEE ARTHROSCOPY  09/22/2008   VAGINAL HYSTERECTOMY      Allergies  Allergen Reactions   Codeine Other (See Comments)    Unknown reaction   Demerol [Meperidine Hcl] Other (See Comments)     Unknown reaction   Morphine And Codeine Other (See Comments)    Unknown reaction    Outpatient Encounter Medications as of 12/21/2023  Medication Sig   acetaminophen  (TYLENOL ) 325 MG tablet Take 650 mg by mouth every 6 (six) hours as needed.   ALPRAZolam  (XANAX ) 0.5 MG tablet Take 0.5 mg by mouth at bedtime. Give one tablet by mouth every 24 hours as needed at bedtime.   apixaban  (ELIQUIS ) 5 MG TABS tablet Take 1 tablet (5 mg total) by mouth 2 (two) times daily.   Cholecalciferol (VITAMIN D -3) 25 MCG (1000 UT) CAPS One by mouth daily.   estradiol (ESTRACE) 0.1 MG/GM vaginal cream Place 1 Applicatorful vaginally. At bedtime every Tuesday, Friday, and Sunday.   famotidine  (PEPCID ) 20 MG tablet Take 20 mg by mouth at bedtime.   furosemide  (LASIX ) 40 MG tablet Take 40 mg by mouth daily. Can give extra 40 mg tablet as needed for weight gain of 3 lbs overnight or 6 lbs in a week.   hydrALAZINE  (APRESOLINE ) 25 MG tablet Take 1 tablet (25 mg total) by mouth 2 (two) times daily.   hydrocortisone (ANUSOL-HC) 25 MG suppository Place 25 mg rectally every 12 (twelve) hours as needed for hemorrhoids.   losartan  (COZAAR ) 50 MG tablet Take 50 mg by mouth daily.   metoprolol  tartrate (LOPRESSOR ) 100 MG tablet Take 100 mg by mouth 2 (two) times daily.   metoprolol  tartrate (LOPRESSOR ) 50 MG tablet Take 50 mg by mouth daily as needed (Palpitations).   nitroGLYCERIN  (NITROSTAT ) 0.4 MG SL tablet Place 1 tablet (0.4 mg total) under the tongue every 5 (five) minutes as needed for chest pain.   ondansetron  (ZOFRAN ) 4 MG tablet Take 4 mg by mouth every 8 (eight) hours as needed for nausea or vomiting.   polyethylene glycol (MIRALAX  / GLYCOLAX ) 17 g packet Take 17 g by mouth daily as needed.   rosuvastatin  (CRESTOR ) 10 MG tablet Take 1 tablet (10 mg total) by mouth daily.   traMADol  (ULTRAM ) 50 MG tablet Take 1 tablet (50 mg total) by mouth every 6 (six) hours as needed. Give 2 tablets by mouth every 6 hours as needed  for severe pain.   Zinc Oxide (TRIPLE PASTE) 12.8 % ointment Apply 1 Application topically. To peri area and skin folds topically twice daily   [DISCONTINUED] ALPRAZolam  (XANAX ) 0.5 MG tablet Take 1 tablet (0.5 mg total) by mouth at bedtime as needed for anxiety or sleep. (Patient taking differently: Take 0.5 mg by mouth at bedtime as needed for anxiety or sleep. Give one tablet by mouth at bedtime)   No facility-administered encounter medications on file as of 12/21/2023.    Review of Systems  Immunization History  Administered Date(s) Administered   Influenza, High Dose Seasonal PF 11/23/2022   Influenza-Unspecified 10/10/2023   Moderna Covid-19 Vaccine Bivalent Booster 36yrs & up 12/19/2023   PFIZER(Purple Top)SARS-COV-2 Vaccination 01/07/2020, 01/27/2020, 11/18/2020   PNEUMOCOCCAL CONJUGATE-20 12/14/2023   Pertinent  Health Maintenance Due  Topic Date Due   DEXA SCAN  Never done   INFLUENZA VACCINE  Completed  No data to display         Functional Status Survey:    Vitals:   12/21/23 1015  BP: 126/66  Pulse: 96  Resp: 20  Temp: 97.6 F (36.4 C)  SpO2: 94%  Weight: 165 lb (74.8 kg)  Height: 5' 6 (1.676 m)   Body mass index is 26.63 kg/m. Physical Exam Cardiovascular:     Rate and Rhythm: Normal rate and regular rhythm.  Pulmonary:     Effort: Pulmonary effort is normal.  Abdominal:     General: Abdomen is flat.  Musculoskeletal:     Comments: Left sided weakness  Skin:    General: Skin is warm and dry.  Neurological:     Mental Status: She is alert.     Labs reviewed: Recent Labs    12/11/23 1601 12/12/23 0604 12/12/23 0605 12/13/23 0943 12/20/23 0000  NA 136  --  137 136 139  K 3.2*  --  3.2* 3.6 3.5  CL 98  --  99 101 101  CO2 26  --  27 26 27*  GLUCOSE 139*  --  116* 145*  --   BUN 14  --  15 12 19   CREATININE 0.73  --  0.91 0.69 0.8  CALCIUM  9.1  --  8.8* 8.9 9.0  MG  --  2.0  --   --   --    Recent Labs    03/13/23 0156  03/14/23 0217 03/29/23 0000 12/11/23 1601 12/20/23 0000  AST 29 20 15 22 15   ALT 13 10 6* 10 8  ALKPHOS 40 40 155* 74 110  BILITOT 0.4 0.8  --  1.0  --   PROT 5.8* 5.7*  --  7.1  --   ALBUMIN 2.8* 2.7* 2.7* 3.4* 3.1*   Recent Labs    03/15/23 0242 03/15/23 0926 04/30/23 0000 07/05/23 0000 12/11/23 1601 12/12/23 0605 12/20/23 0000  WBC 13.9*   < > 8.3   < > 9.4 10.6* 12.8  NEUTROABS  --    < > 4,930.00  --  6.7  --  9,562.00  HGB 8.8*   < > 12.8   < > 14.2 13.7 15.2  HCT 26.0*   < > 40   < > 43.2 40.3 46  MCV 93.2  --   --   --  92.9 89.6  --   PLT 250   < > 413*   < > 347 342 426*   < > = values in this interval not displayed.   Lab Results  Component Value Date   TSH 4.35 08/27/2023   Lab Results  Component Value Date   HGBA1C 6.5 (H) 12/12/2023   Lab Results  Component Value Date   CHOL 185 12/12/2023   HDL 32 (L) 12/12/2023   LDLCALC 130 (H) 12/12/2023   TRIG 117 12/12/2023   CHOLHDL 5.8 12/12/2023    Significant Diagnostic Results in last 30 days:  ECHOCARDIOGRAM COMPLETE Result Date: 12/13/2023    ECHOCARDIOGRAM REPORT   Patient Name:   JOLIE STROHECKER Date of Exam: 12/13/2023 Medical Rec #:  [id number]    Height:       66.0 in Accession #:    [id number]   Weight:       184.1 lb Date of Birth:  15-Feb-1926     BSA:          1.931 m Patient Age:    89 years     BP:  170/80 mmHg Patient Gender: F            HR:           68 bpm. Exam Location:  [facility] Procedure: 2D Echo, Cardiac Doppler and Color Doppler Indications:     Stroke  History:         Patient has prior history of Echocardiogram examinations, most                  recent 03/14/2018. CHF, CAD, Stroke, Arrythmias:Atrial                  Fibrillation, Signs/Symptoms:Fatigue and Shortness of Breath;                  Risk Factors:Hypertension and Dyslipidemia. CKD.  Sonographer:     Naomie Reef Referring Phys:  [id number] DELAYNE LULLA SOLIAN Diagnosing Phys: Evalene Lunger MD IMPRESSIONS  1. Left ventricular  ejection fraction, by estimation, is 50 to 55%. Left ventricular ejection fraction by PLAX is 55 %. The left ventricle has low normal function. The left ventricle has no regional wall motion abnormalities. There is mild left ventricular hypertrophy. Left ventricular diastolic parameters are indeterminate.  2. Right ventricular systolic function is normal. The right ventricular size is mildly enlarged. There is mildly elevated pulmonary artery systolic pressure. The estimated right ventricular systolic pressure is 38.0 mmHg.  3. Right atrial size was moderately dilated.  4. The mitral valve is normal in structure. Mild to moderate mitral valve regurgitation. No evidence of mitral stenosis.  5. Tricuspid valve regurgitation is moderate.  6. The aortic valve is normal in structure. There is mild calcification of the aortic valve. Aortic valve regurgitation is mild. Aortic valve sclerosis/calcification is present, without any evidence of aortic stenosis. Aortic valve mean gradient measures 4.0 mmHg.  7. The inferior vena cava is normal in size with greater than 50% respiratory variability, suggesting right atrial pressure of 3 mmHg. FINDINGS  Left Ventricle: Left ventricular ejection fraction, by estimation, is 50 to 55%. Left ventricular ejection fraction by PLAX is 55 %. The left ventricle has low normal function. The left ventricle has no regional wall motion abnormalities. The left ventricular internal cavity size was normal in size. There is mild left ventricular hypertrophy. Left ventricular diastolic parameters are indeterminate. Right Ventricle: The right ventricular size is mildly enlarged. No increase in right ventricular wall thickness. Right ventricular systolic function is normal. There is mildly elevated pulmonary artery systolic pressure. The tricuspid regurgitant velocity is 2.74 m/s, and with an assumed right atrial pressure of 8 mmHg, the estimated right ventricular systolic pressure is 38.0 mmHg. Left  Atrium: Left atrial size was normal in size. Right Atrium: Right atrial size was moderately dilated. Pericardium: There is no evidence of pericardial effusion. Mitral Valve: The mitral valve is normal in structure. Mild mitral annular calcification. Mild to moderate mitral valve regurgitation. No evidence of mitral valve stenosis. MV peak gradient, 4.8 mmHg. The mean mitral valve gradient is 1.0 mmHg. Tricuspid Valve: The tricuspid valve is normal in structure. Tricuspid valve regurgitation is moderate . No evidence of tricuspid stenosis. Aortic Valve: The aortic valve is normal in structure. There is mild calcification of the aortic valve. Aortic valve regurgitation is mild. Aortic valve sclerosis/calcification is present, without any evidence of aortic stenosis. Aortic valve mean gradient measures 4.0 mmHg. Aortic valve peak gradient measures 7.6 mmHg. Aortic valve area, by VTI measures 2.05 cm. Pulmonic Valve: The pulmonic valve was normal in structure. Pulmonic  valve regurgitation is mild. No evidence of pulmonic stenosis. Aorta: The aortic root is normal in size and structure. Venous: The inferior vena cava is normal in size with greater than 50% respiratory variability, suggesting right atrial pressure of 3 mmHg. IAS/Shunts: No atrial level shunt detected by color flow Doppler.  LEFT VENTRICLE PLAX 2D LV EF:         Left ventricular ejection fraction by PLAX is 55 %. LVIDd:         4.20 cm LVIDs:         3.00 cm LV PW:         1.20 cm LV IVS:        1.20 cm LVOT diam:     2.00 cm LV SV:         65 LV SV Index:   34 LVOT Area:     3.14 cm  RIGHT VENTRICLE RV Basal diam:  4.30 cm RV Mid diam:    3.90 cm RV S prime:     8.92 cm/s LEFT ATRIUM             Index        RIGHT ATRIUM           Index LA diam:        4.00 cm 2.07 cm/m   RA Area:     28.30 cm LA Vol (A2C):   63.7 ml 33.00 ml/m  RA Volume:   100.00 ml 51.80 ml/m LA Vol (A4C):   58.1 ml 30.10 ml/m LA Biplane Vol: 63.3 ml 32.79 ml/m  AORTIC VALVE                     PULMONIC VALVE AV Area (Vmax):    1.97 cm     PV Vmax:       0.72 m/s AV Area (Vmean):   1.84 cm     PV Peak grad:  2.1 mmHg AV Area (VTI):     2.05 cm AV Vmax:           137.50 cm/s AV Vmean:          92.450 cm/s AV VTI:            0.318 m AV Peak Grad:      7.6 mmHg AV Mean Grad:      4.0 mmHg LVOT Vmax:         86.30 cm/s LVOT Vmean:        54.200 cm/s LVOT VTI:          0.208 m LVOT/AV VTI ratio: 0.65  AORTA Ao Root diam: 3.30 cm Ao Asc diam:  3.60 cm MITRAL VALVE                TRICUSPID VALVE MV Area (PHT): 3.66 cm     TR Peak grad:   30.0 mmHg MV Area VTI:   2.61 cm     TR Vmax:        274.00 cm/s MV Peak grad:  4.8 mmHg MV Mean grad:  1.0 mmHg     SHUNTS MV Vmax:       1.10 m/s     Systemic VTI:  0.21 m MV Vmean:      46.5 cm/s    Systemic Diam: 2.00 cm MV Decel Time: 207 msec MV E velocity: 110.00 cm/s Evalene Lunger MD Electronically signed by Evalene Lunger MD Signature Date/Time: 12/13/2023/2:31:14 PM    Final    CT Angio  Head Neck W WO CM Result Date: 12/11/2023 CLINICAL DATA:  Left hand tingling and weakness, left arm numbness; acute infarct in the medulla on same-day MRI EXAM: CT ANGIOGRAPHY HEAD AND NECK WITH AND WITHOUT CONTRAST TECHNIQUE: Multidetector CT imaging of the head and neck was performed using the standard protocol during bolus administration of intravenous contrast. Multiplanar CT image reconstructions and MIPs were obtained to evaluate the vascular anatomy. Carotid stenosis measurements (when applicable) are obtained utilizing NASCET criteria, using the distal internal carotid diameter as the denominator. RADIATION DOSE REDUCTION: This exam was performed according to the departmental dose-optimization program which includes automated exposure control, adjustment of the mA and/or kV according to patient size and/or use of iterative reconstruction technique. CONTRAST:  75mL OMNIPAQUE  IOHEXOL  350 MG/ML SOLN COMPARISON:  12/11/2023 CT head, 12/11/2023 MRI head; no  prior CTA available FINDINGS: CT HEAD FINDINGS For noncontrast findings, please see same day CT head. CTA NECK FINDINGS Aortic arch: Two-vessel arch with a common origin of the brachiocephalic and left common carotid arteries. Imaged portion shows no evidence of aneurysm or dissection. 50% stenosis at the origin of the left common carotid artery (series 7, image 88). 30% stenosis at the origin of the left subclavian artery (series 7, image 98). 40% stenosis at the origin of the brachiocephalic artery (series 6, image 284). Right carotid system: 50% stenosis in the proximal right ICA (series 6, image 194). No evidence of dissection. Left carotid system: 60% stenosis in the proximal left ICA (series 6, image 193). No evidence of dissection. Vertebral arteries: The right vertebral artery is occluded at its origin (series 6, image 271) and throughout the V1, V2, and V3 segments, with minimal likely retrograde opacification in the distal right V3 (series 6, image 154). Moderate stenosis at the origin of the left vertebral artery (series 7, image 102). The left vertebral artery is otherwise patent to the skull base, without significant stenosis or evidence of dissection. Skeleton: No acute osseous abnormality. Degenerative changes in the cervical spine. Other neck: No acute finding. Upper chest: No focal pulmonary opacity or pleural effusion. Review of the MIP images confirms the above findings CTA HEAD FINDINGS Anterior circulation: Both internal carotid arteries are patent to the termini, with mild stenosis in the bilateral cavernous and supraclinoid segments. Patent left A1. Short-segment nonopacification of the proximal right A1 (series 6, image 97). Opacification of the distal right A1 may be retrograde. Normal anterior communicating artery. Anterior cerebral arteries are patent to their distal aspects without significant stenosis. Multifocal stenosis in the M1 segments bilaterally, which is severe in the distal left  M 1 (series 6, image 98) and moderate in the right M 1 (series 6, image 100). Additional multifocal narrowing in the M2 segment (series 6, image 95 and 101). Distal MCA branches are irregular but appear grossly perfused. Posterior circulation: The left vertebral artery is patent to the vertebrobasilar junction. The right vertebral artery is patent, with diminished, likely retrograde opacification. Posterior inferior cerebellar arteries patent proximally. Basilar patent to its distal aspect without significant stenosis. Superior cerebellar arteries patent proximally. Patent P1 segments, particularly hypoplastic on the left. Near fetal origin of the left PCA from the left posterior communicating artery, which may have severe stenosis at its origin (series 6, image 106). Severe stenosis in the proximal right P2 (series 6, image 98). The remainder of the PCAs are irregular but without focal stenosis Venous sinuses: As permitted by contrast timing, patent. Anatomic variants: Near fetal origin of the left PCA. No  evidence of aneurysm or vascular malformation. Review of the MIP images confirms the above findings IMPRESSION: 1. Occlusion of the right vertebral artery at its origin and throughout the V1, V2, and V3 segments, with minimal likely retrograde opacification in the distal right V3 and in the V4 segment. This is of indeterminate acuity but is favored to be chronic. 2. Short-segment nonopacification of the proximal right A1, which may be due to severe stenosis or occlusion. Opacification of the distal right A1 may be retrograde. 3. Severe stenosis in the distal left M1 and moderate stenosis in the right M1. Additional multifocal narrowing in the M2 segments bilaterally. Distal MCA branches are irregular but appear grossly perfused. 4. Near fetal origin of the left PCA from the left posterior communicating artery, which may have severe stenosis at its origin. Severe stenosis in the proximal right P2. 5. 50% stenosis  in the proximal right ICA and 60% stenosis in the proximal left ICA. 6. 50% stenosis at the origin of the left common carotid artery, 30% stenosis at the origin of the left subclavian artery, and 40% stenosis at the origin of the brachiocephalic artery. Electronically Signed   By: Donald Campion M.D.   On: 12/11/2023 23:12   MR BRAIN WO CONTRAST Result Date: 12/11/2023 CLINICAL DATA:  Left hand tingling and weakness, left arm numbness EXAM: MRI HEAD WITHOUT CONTRAST TECHNIQUE: Multiplanar, multiecho pulse sequences of the brain and surrounding structures were obtained without intravenous contrast. COMPARISON:  No prior MRI available, correlation is made with 12/11/2023 CT head FINDINGS: Brain: Restricted diffusion with ADC correlate in the right medial medulla near the pontomedullary junction (series 5, image 12 and series 13, image 18). No acute hemorrhage, mass, mass effect, or midline shift. No hydrocephalus or extra-axial collection. Partial empty sella. Normal craniocervical junction. No hemosiderin deposition to suggest remote hemorrhage. Age related cerebral atrophy. Remote lacunar infarcts in the bilateral basal ganglia and right thalamus. Vascular: Somewhat diminished signal in the right vertebral artery. Otherwise normal arterial flow voids. Skull and upper cervical spine: Normal marrow signal. Sinuses/Orbits: Clear paranasal sinuses. Status post bilateral lens replacements. Other: Trace fluid in left mastoid air cells. IMPRESSION: 1. Acute infarct in the right medial medulla near the pontomedullary junction. 2. Somewhat diminished signal in the right vertebral artery, which could be due to slow flow or occlusion. Consider further evaluation with CTA. These results were called by telephone at the time of interpretation on 12/11/2023 at 10:10 pm to provider [facility] , who verbally acknowledged these results. Electronically Signed   By: Donald Campion M.D.   On: 12/11/2023 22:10   CT HEAD WO  CONTRAST Result Date: 12/11/2023 CLINICAL DATA:  Neuro deficit, acute, stroke suspected. Tingling and weakness of the left arm and hand. EXAM: CT HEAD WITHOUT CONTRAST TECHNIQUE: Contiguous axial images were obtained from the base of the skull through the vertex without intravenous contrast. RADIATION DOSE REDUCTION: This exam was performed according to the departmental dose-optimization program which includes automated exposure control, adjustment of the mA and/or kV according to patient size and/or use of iterative reconstruction technique. COMPARISON:  03/12/2023 FINDINGS: Brain: No focal abnormality affects the brainstem or cerebellum. Cerebral hemispheres show generalized age related atrophy with chronic small-vessel ischemic changes of the white matter, thalami and basal ganglia. No sign of acute infarction, mass lesion, hemorrhage, hydrocephalus or extra-axial collection. Vascular: Extensive calcification associated with the carotid siphons and middle cerebral arteries. Similar to the prior study. Skull: Normal Sinuses/Orbits: Clear/normal Other: None IMPRESSION: No  acute CT finding. Age related atrophy with chronic small-vessel ischemic changes of the white matter, thalami and basal ganglia. Extensive calcification associated with the carotid siphons and middle cerebral arteries, unchanged from the prior exam. Electronically Signed   By: Oneil Officer M.D.   On: 12/11/2023 16:20    Assessment/Plan Post-stroke adjustment Significant life change following recent stroke, with increased dependency and need for assistance. Acknowledged the difficulty of this transition and the importance of asking for support. -Continue to encourage patient to ask for support as needed.  Diarrhea Frequent episodes of diarrhea, causing discomfort and distress. Labs checked last Thursday were normal. -Add Imodium to help decrease the frequency of diarrhea. -Check labs again to monitor for any abnormalities due to  persistent diarrhea.  Anxiety Longstanding anxiety, currently managed with nightly Xanax  and additional dose available during the day. Recent addition of sertraline to help manage increased anxiety following stroke. Family concerned about potential discontinuation of Xanax . -Continue Xanax  nightly and as needed during the day. -Continue sertraline for additional anxiety management. -Consider additional recommendations from new team for managing anxiety.  Loneliness and anxiety in the evenings Patient experiences increased anxiety and loneliness from dinner time until sleep. Family interested in hiring a sitter for these hours. -Contact social workers to provide information on private pay sitter services for the evening hours.  Family/ staff Communication: nursing, Madeline, social work  Labs/tests ordered:  BMP 1 week, f/u c.diff

## 2023-12-24 DIAGNOSIS — I69354 Hemiplegia and hemiparesis following cerebral infarction affecting left non-dominant side: Secondary | ICD-10-CM | POA: Insufficient documentation

## 2023-12-27 ENCOUNTER — Other Ambulatory Visit: Payer: Self-pay | Admitting: Nurse Practitioner

## 2023-12-27 DIAGNOSIS — F1320 Sedative, hypnotic or anxiolytic dependence, uncomplicated: Secondary | ICD-10-CM

## 2023-12-27 LAB — COMPREHENSIVE METABOLIC PANEL
Calcium: 8.2 — AB (ref 8.7–10.7)
eGFR: 75

## 2023-12-27 LAB — BASIC METABOLIC PANEL
BUN: 21 (ref 4–21)
CO2: 29 — AB (ref 13–22)
Chloride: 101 (ref 99–108)
Creatinine: 0.7 (ref 0.5–1.1)
Glucose: 101
Potassium: 3.4 meq/L — AB (ref 3.5–5.1)
Sodium: 139 (ref 137–147)

## 2023-12-27 MED ORDER — ALPRAZOLAM 1 MG PO TABS: 1.0000 mg | ORAL_TABLET | Freq: Every day | ORAL | 0 refills | Status: DC

## 2023-12-27 MED ORDER — ALPRAZOLAM 0.5 MG PO TABS: 0.5000 mg | ORAL_TABLET | Freq: Every day | ORAL | 0 refills | Status: DC | PRN

## 2023-12-28 DIAGNOSIS — F4322 Adjustment disorder with anxiety: Secondary | ICD-10-CM | POA: Diagnosis not present

## 2024-01-10 ENCOUNTER — Non-Acute Institutional Stay (INDEPENDENT_AMBULATORY_CARE_PROVIDER_SITE_OTHER): Payer: Medicare Other | Admitting: Nurse Practitioner

## 2024-01-10 ENCOUNTER — Encounter: Payer: Self-pay | Admitting: Nurse Practitioner

## 2024-01-10 DIAGNOSIS — E440 Moderate protein-calorie malnutrition: Secondary | ICD-10-CM | POA: Diagnosis not present

## 2024-01-10 DIAGNOSIS — I509 Heart failure, unspecified: Secondary | ICD-10-CM | POA: Diagnosis not present

## 2024-01-10 DIAGNOSIS — F4322 Adjustment disorder with anxiety: Secondary | ICD-10-CM | POA: Diagnosis not present

## 2024-01-10 DIAGNOSIS — Z0000 Encounter for general adult medical examination without abnormal findings: Secondary | ICD-10-CM | POA: Diagnosis not present

## 2024-01-10 LAB — BASIC METABOLIC PANEL
BUN: 20 (ref 4–21)
CO2: 30 — AB (ref 13–22)
Chloride: 101 (ref 99–108)
Creatinine: 0.6 (ref 0.5–1.1)
Glucose: 85
Potassium: 3.8 meq/L (ref 3.5–5.1)
Sodium: 134 — AB (ref 137–147)

## 2024-01-10 LAB — COMPREHENSIVE METABOLIC PANEL
Calcium: 8.3 — AB (ref 8.7–10.7)
eGFR: 81

## 2024-01-10 NOTE — Patient Instructions (Addendum)
  Ms. Tafel , Thank you for taking time to come for your Medicare Wellness Visit. I appreciate your ongoing commitment to your health goals. Please review the following plan we discussed and let me know if I can assist you in the future.   TDAP and shingles vaccines due, can get at facility if interested.   This is a list of the screening recommended for you and due dates:  Health Maintenance  Topic Date Due   DTaP/Tdap/Td vaccine (1 - Tdap) Never done   Zoster (Shingles) Vaccine (1 of 2) Never done   COVID-19 Vaccine (5 - 2024-25 season) 02/13/2024   Medicare Annual Wellness Visit  01/09/2025   Pneumonia Vaccine  Completed   Flu Shot  Completed   HPV Vaccine  Aged Out   DEXA scan (bone density measurement)  Discontinued

## 2024-01-10 NOTE — Progress Notes (Signed)
Subjective:   Tanya Ho is a 89 y.o. female who presents for Medicare Annual (Subsequent) preventive examination.  Visit Complete: In person   Cardiac Risk Factors include: advanced age (>15men, >62 women)     Objective:    Today's Vitals   01/10/24 1005  BP: (!) 132/58  Pulse: 97  Resp: 18  Temp: (!) 97 F (36.1 C)  SpO2: 95%  Weight: 169 lb 3.2 oz (76.7 kg)  Height: 5\' 6"  (1.676 m)   Body mass index is 27.31 kg/m.     01/10/2024   10:12 AM 12/21/2023   10:34 AM 12/17/2023   10:13 AM 12/11/2023    4:00 PM 10/30/2023   12:04 PM 10/08/2023   10:30 AM 10/03/2023   11:25 AM  Advanced Directives  Does Patient Have a Medical Advance Directive? Yes Yes Yes No Yes Yes Yes  Type of Advance Directive Out of facility DNR (pink MOST or yellow form) Out of facility DNR (pink MOST or yellow form) Out of facility DNR (pink MOST or yellow form)  Out of facility DNR (pink MOST or yellow form) Out of facility DNR (pink MOST or yellow form) Out of facility DNR (pink MOST or yellow form)  Does patient want to make changes to medical advance directive? No - Patient declined No - Patient declined No - Patient declined  No - Patient declined No - Patient declined No - Patient declined  Would patient like information on creating a medical advance directive?   No - Patient declined No - Patient declined     Pre-existing out of facility DNR order (yellow form or pink MOST form)   Yellow form placed in chart (order not valid for inpatient use)        Current Medications (verified) Outpatient Encounter Medications as of 01/10/2024  Medication Sig   acetaminophen (TYLENOL) 325 MG tablet Take 650 mg by mouth every 6 (six) hours as needed.   ALPRAZolam (XANAX) 0.5 MG tablet Take 1 tablet (0.5 mg total) by mouth daily as needed for anxiety. Give one tablet by mouth every 24 hours as needed at bedtime.   ALPRAZolam (XANAX) 1 MG tablet Take 1 tablet (1 mg total) by mouth at bedtime.   apixaban  (ELIQUIS) 5 MG TABS tablet Take 1 tablet (5 mg total) by mouth 2 (two) times daily.   Cholecalciferol (VITAMIN D-3) 25 MCG (1000 UT) CAPS One by mouth daily.   estradiol (ESTRACE) 0.1 MG/GM vaginal cream Place 1 Applicatorful vaginally. At bedtime every Tuesday, Friday, and Sunday.   famotidine (PEPCID) 20 MG tablet Take 20 mg by mouth at bedtime.   furosemide (LASIX) 40 MG tablet Take 40 mg by mouth daily. Can give extra 40 mg tablet as needed for weight gain of 3 lbs overnight or 6 lbs in a week.   hydrALAZINE (APRESOLINE) 25 MG tablet Take 1 tablet (25 mg total) by mouth 2 (two) times daily.   hydrocortisone (ANUSOL-HC) 25 MG suppository Place 25 mg rectally every 12 (twelve) hours as needed for hemorrhoids.   losartan (COZAAR) 50 MG tablet Take 50 mg by mouth daily.   metoprolol tartrate (LOPRESSOR) 100 MG tablet Take 100 mg by mouth 2 (two) times daily.   metoprolol tartrate (LOPRESSOR) 50 MG tablet Take 50 mg by mouth daily as needed (Palpitations).   nitroGLYCERIN (NITROSTAT) 0.4 MG SL tablet Place 1 tablet (0.4 mg total) under the tongue every 5 (five) minutes as needed for chest pain.   ondansetron (ZOFRAN) 4  MG tablet Take 4 mg by mouth every 8 (eight) hours as needed for nausea or vomiting.   polyethylene glycol (MIRALAX / GLYCOLAX) 17 g packet Take 17 g by mouth daily as needed.   rosuvastatin (CRESTOR) 10 MG tablet Take 1 tablet (10 mg total) by mouth daily.   sertraline (ZOLOFT) 25 MG tablet Take 75 mg by mouth daily.   traMADol (ULTRAM) 50 MG tablet Take 1 tablet (50 mg total) by mouth every 6 (six) hours as needed. Give 2 tablets by mouth every 6 hours as needed for severe pain.   vancomycin (VANCOCIN) 125 MG capsule Take 125 mg by mouth 4 (four) times daily.   Zinc Oxide (TRIPLE PASTE) 12.8 % ointment Apply 1 Application topically. To peri area and skin folds topically twice daily   No facility-administered encounter medications on file as of 01/10/2024.    Allergies  (verified) Codeine, Demerol [meperidine hcl], and Morphine and codeine   History: Past Medical History:  Diagnosis Date   Back pain    Bruises easily    Coronary artery disease    NONOBSTRUCTIVE   Fatigue    Hyperlipidemia    Hypertension    Knee pain    OA (osteoarthritis)    Obesity    Palpitations    SOB (shortness of breath)    Past Surgical History:  Procedure Laterality Date   BREAST BIOPSY     LEFT BREAST   CARDIAC CATHETERIZATION  05/11/2006   OVERALL CARDIAC SIZE AND SILHOUETTE ARE NORMAL. EF ESTIMATED 60%   INTRAMEDULLARY (IM) NAIL INTERTROCHANTERIC Left 03/12/2023   Procedure: INTRAMEDULLARY (IM) NAIL INTERTROCHANTERIC;  Surgeon: Roby Lofts, MD;  Location: [facility] OR;  Service: Orthopedics;  Laterality: Left;   KIDNEY STONES  1982   KNEE ARTHROSCOPY  09/22/2008   VAGINAL HYSTERECTOMY     Family History  Problem Relation Age of Onset   Heart disease Mother    Stroke Mother    Heart attack Father    Social History   Socioeconomic History   Marital status: Widowed    Spouse name: Not on file   Number of children: Not on file   Years of education: Not on file   Highest education level: Not on file  Occupational History   Not on file  Tobacco Use   Smoking status: Never   Smokeless tobacco: Never  Vaping Use   Vaping status: Never Used  Substance and Sexual Activity   Alcohol use: No   Drug use: No   Sexual activity: Not Currently  Other Topics Concern   Not on file  Social History Narrative   Not on file   Social Drivers of Health   Financial Resource Strain: Not on file  Food Insecurity: No Food Insecurity (12/12/2023)   Hunger Vital Sign    Worried About Running Out of Food in the Last Year: Never true    Ran Out of Food in the Last Year: Never true  Transportation Needs: No Transportation Needs (12/12/2023)   PRAPARE - Administrator, Civil Service (Medical): No    Lack of Transportation (Non-Medical): No  Physical Activity:  Not on file  Stress: Not on file  Social Connections: Not on file    Tobacco Counseling Counseling given: Not Answered   Clinical Intake:  Pre-visit preparation completed: Yes        Nutritional Risks: Nausea/ vomitting/ diarrhea            Activities of Daily Living  01/10/2024    1:14 PM 12/12/2023    5:00 AM  In your present state of health, do you have any difficulty performing the following activities:  Hearing? 1 1  Vision? 0 1  Difficulty concentrating or making decisions? 1 1  Walking or climbing stairs? 1   Dressing or bathing? 1   Doing errands, shopping? 1   Preparing Food and eating ? N   Comment unable to prepare food   Using the Toilet? Y   In the past six months, have you accidently leaked urine? Y   Do you have problems with loss of bowel control? Y   Managing your Medications? Y   Managing your Finances? Y   Housekeeping or managing your Housekeeping? Y     Patient Care Team: [name], MD as PCP - General (Family Medicine) [name], MD (Inactive) as PCP - Cardiology (Cardiology) [name], MD as PCP - Electrophysiology (Cardiology)  Indicate any recent Medical Services you may have received from other than [facility] providers in the past year (date may be approximate).     Assessment:   This is a routine wellness examination for Tanya Ho.  Hearing/Vision screen No results found.   Goals Addressed   None    Depression Screen     No data to display          Fall Risk     No data to display          MEDICARE RISK AT HOME:    TIMED UP AND GO:  Was the test performed?  No    Cognitive Function:        Immunizations Immunization History  Administered Date(s) Administered   Influenza, High Dose Seasonal PF 11/23/2022   Influenza-Unspecified 10/10/2023   Moderna Covid-19 Vaccine Bivalent Booster 68yrs & up 12/19/2023   PFIZER(Purple Top)SARS-COV-2 Vaccination 01/07/2020, 01/27/2020,  11/18/2020   PNEUMOCOCCAL CONJUGATE-20 12/14/2023    TDAP status: Due, Education has been provided regarding the importance of this vaccine. Advised may receive this vaccine at local pharmacy or Health Dept. Aware to provide a copy of the vaccination record if obtained from local pharmacy or Health Dept. Verbalized acceptance and understanding.  Flu Vaccine status: Up to date  Pneumococcal vaccine status: Up to date  Covid-19 vaccine status: Information provided on how to obtain vaccines.   Qualifies for Shingles Vaccine? Yes   Zostavax completed No   Shingrix Completed?: No.    Education has been provided regarding the importance of this vaccine. Patient has been advised to call insurance company to determine out of pocket expense if they have not yet received this vaccine. Advised may also receive vaccine at local pharmacy or Health Dept. Verbalized acceptance and understanding.  Screening Tests Health Maintenance  Topic Date Due   DTaP/Tdap/Td (1 - Tdap) Never done   Zoster Vaccines- Shingrix (1 of 2) Never done   COVID-19 Vaccine (5 - 2024-25 season) 02/13/2024   Medicare Annual Wellness (AWV)  01/09/2025   Pneumonia Vaccine 24+ Years old  Completed   INFLUENZA VACCINE  Completed   HPV VACCINES  Aged Out   DEXA SCAN  Discontinued    Health Maintenance  Health Maintenance Due  Topic Date Due   DTaP/Tdap/Td (1 - Tdap) Never done   Zoster Vaccines- Shingrix (1 of 2) Never done    Colorectal cancer screening: No longer required.   Mammogram status: No longer required due to age.  Aged out of bone density   Lung  Cancer Screening: (Low Dose CT Chest recommended if Age 78-80 years, 20 pack-year currently smoking OR have quit w/in 15years.) does not qualify.   Lung Cancer Screening Referral: na  Additional Screening:  Hepatitis C Screening: does not qualify  Vision Screening: Recommended annual ophthalmology exams for early detection of glaucoma and other disorders of  the eye. Is the patient up to date with their annual eye exam?  Yes  Who is the provider or what is the name of the office in which the patient attends annual eye exams? [facility] If pt is not established with a provider, would they like to be referred to a provider to establish care? No .   Dental Screening: Recommended annual dental exams for proper oral hygiene   Community Resource Referral / Chronic Care Management: CRR required this visit?  No   CCM required this visit?  No     Plan:     I have personally reviewed and noted the following in the patient's chart:   Medical and social history Use of alcohol, tobacco or illicit drugs  Current medications and supplements including opioid prescriptions. Patient is not currently taking opioid prescriptions. Functional ability and status Nutritional status Physical activity Advanced directives List of other physicians Hospitalizations, surgeries, and ER visits in previous 12 months Vitals Screenings to include cognitive, depression, and falls Referrals and appointments  In addition, I have reviewed and discussed with patient certain preventive protocols, quality metrics, and best practice recommendations. A written personalized care plan for preventive services as well as general preventive health recommendations were provided to patient.     Sharon Seller, NP   01/10/2024

## 2024-01-21 ENCOUNTER — Non-Acute Institutional Stay (SKILLED_NURSING_FACILITY): Payer: Self-pay | Admitting: Student

## 2024-01-21 ENCOUNTER — Encounter: Payer: Self-pay | Admitting: Student

## 2024-01-21 DIAGNOSIS — I69354 Hemiplegia and hemiparesis following cerebral infarction affecting left non-dominant side: Secondary | ICD-10-CM | POA: Diagnosis not present

## 2024-01-21 DIAGNOSIS — A0472 Enterocolitis due to Clostridium difficile, not specified as recurrent: Secondary | ICD-10-CM | POA: Diagnosis not present

## 2024-01-21 LAB — CBC AND DIFFERENTIAL
HCT: 40 (ref 36–46)
Hemoglobin: 13 (ref 12.0–16.0)
Platelets: 303 10*3/uL (ref 150–400)
WBC: 10

## 2024-01-21 LAB — BASIC METABOLIC PANEL
BUN: 19 (ref 4–21)
CO2: 31 — AB (ref 13–22)
Chloride: 100 (ref 99–108)
Creatinine: 0.7 (ref 0.5–1.1)
Glucose: 93
Potassium: 3.4 meq/L — AB (ref 3.5–5.1)
Sodium: 137 (ref 137–147)

## 2024-01-21 LAB — CBC: RBC: 4.27 (ref 3.87–5.11)

## 2024-01-21 LAB — COMPREHENSIVE METABOLIC PANEL
Calcium: 8.5 — AB (ref 8.7–10.7)
eGFR: 76

## 2024-01-21 NOTE — Progress Notes (Signed)
Location:  Other Nursing Home Room Number: [facility] [address] Place of Service:  SNF [phone]) Provider:  Ander Gaster, Benetta Spar, MD  Patient Care Team: [name], MD as PCP - General (Family Medicine) [name], MD (Inactive) as PCP - Cardiology (Cardiology) [name], MD as PCP - Electrophysiology (Cardiology)  Extended Emergency Contact Information Primary Emergency Contact: Sofie Rower [place] Home Phone: [phone] Relation: Daughter Secondary Emergency Contact: Ceniceros,Danny Mobile Phone: [phone] Relation: Son  Code Status:  DNR Goals of care: Advanced Directive information    01/10/2024   10:12 AM  Advanced Directives  Does Patient Have a Medical Advance Directive? Yes  Type of Advance Directive Out of facility DNR (pink MOST or yellow form)  Does patient want to make changes to medical advance directive? No - Patient declined     Chief Complaint  Patient presents with   Diarrhea    HPI:  Pt is a 89 y.o. female seen today for an acute visit for f/u of C. Diff infection:  History of Present Illness The patient, with a history of stroke, presents with persistent diarrhea following a C. difficile infection. She is accompanied by her daughter, who is involved in her care.  She has been experiencing persistent diarrhea approximately two to three times a day since her discharge from the hospital, where she acquired a C. difficile infection. Despite treatment, the diarrhea persists whenever the medication is stopped. She feels 'tired and worn out' but has no pain or stomach tenderness. She is currently taking oral vancomycin four times daily to manage the infection. There is a history of difficulty in clearing the infection, and she continues to experience loose stools when not on medication.  She has limitations following a stroke, which affects her mobility and ability to perform daily activities. She has difficulty  walking and performing tasks with one hand.  Past Medical History - C. diff infection - Stroke  Medications - Vancomycin (4 times daily)   Past Medical History:  Diagnosis Date   Back pain    Bruises easily    Coronary artery disease    NONOBSTRUCTIVE   Fatigue    Hyperlipidemia    Hypertension    Knee pain    OA (osteoarthritis)    Obesity    Palpitations    SOB (shortness of breath)    Past Surgical History:  Procedure Laterality Date   BREAST BIOPSY     LEFT BREAST   CARDIAC CATHETERIZATION  05/11/2006   OVERALL CARDIAC SIZE AND SILHOUETTE ARE NORMAL. EF ESTIMATED 60%   INTRAMEDULLARY (IM) NAIL INTERTROCHANTERIC Left 03/12/2023   Procedure: INTRAMEDULLARY (IM) NAIL INTERTROCHANTERIC;  Surgeon: Roby Lofts, MD;  Location: [facility] OR;  Service: Orthopedics;  Laterality: Left;   KIDNEY STONES  1982   KNEE ARTHROSCOPY  09/22/2008   VAGINAL HYSTERECTOMY      Allergies  Allergen Reactions   Codeine Other (See Comments)    Unknown reaction   Demerol [Meperidine Hcl] Other (See Comments)    Unknown reaction   Morphine And Codeine Other (See Comments)    Unknown reaction    Outpatient Encounter Medications as of 01/21/2024  Medication Sig   acetaminophen (TYLENOL) 325 MG tablet Take 650 mg by mouth every 6 (six) hours as needed.   ALPRAZolam (XANAX) 0.5 MG tablet Take 1 tablet (0.5 mg total) by mouth daily as needed for anxiety. Give one tablet by mouth every 24 hours as needed at bedtime.   ALPRAZolam (  XANAX) 1 MG tablet Take 1 tablet (1 mg total) by mouth at bedtime.   apixaban (ELIQUIS) 5 MG TABS tablet Take 1 tablet (5 mg total) by mouth 2 (two) times daily.   Cholecalciferol (VITAMIN D-3) 25 MCG (1000 UT) CAPS One by mouth daily.   estradiol (ESTRACE) 0.1 MG/GM vaginal cream Place 1 Applicatorful vaginally. At bedtime every Tuesday, Friday, and Sunday.   famotidine (PEPCID) 20 MG tablet Take 20 mg by mouth at bedtime.   furosemide (LASIX) 40 MG tablet Take 40 mg by  mouth daily. Can give extra 40 mg tablet as needed for weight gain of 3 lbs overnight or 6 lbs in a week.   hydrALAZINE (APRESOLINE) 25 MG tablet Take 1 tablet (25 mg total) by mouth 2 (two) times daily.   hydrocortisone (ANUSOL-HC) 25 MG suppository Place 25 mg rectally every 12 (twelve) hours as needed for hemorrhoids.   losartan (COZAAR) 50 MG tablet Take 50 mg by mouth daily.   metoprolol tartrate (LOPRESSOR) 100 MG tablet Take 100 mg by mouth 2 (two) times daily.   metoprolol tartrate (LOPRESSOR) 50 MG tablet Take 50 mg by mouth daily as needed (Palpitations).   nitroGLYCERIN (NITROSTAT) 0.4 MG SL tablet Place 1 tablet (0.4 mg total) under the tongue every 5 (five) minutes as needed for chest pain.   ondansetron (ZOFRAN) 4 MG tablet Take 4 mg by mouth every 8 (eight) hours as needed for nausea or vomiting.   polyethylene glycol (MIRALAX / GLYCOLAX) 17 g packet Take 17 g by mouth daily as needed.   rosuvastatin (CRESTOR) 10 MG tablet Take 1 tablet (10 mg total) by mouth daily.   sertraline (ZOLOFT) 25 MG tablet Take 75 mg by mouth daily.   traMADol (ULTRAM) 50 MG tablet Take 1 tablet (50 mg total) by mouth every 6 (six) hours as needed. Give 2 tablets by mouth every 6 hours as needed for severe pain.   vancomycin (VANCOCIN) 125 MG capsule Take 125 mg by mouth 4 (four) times daily.   Zinc Oxide (TRIPLE PASTE) 12.8 % ointment Apply 1 Application topically. To peri area and skin folds topically twice daily   No facility-administered encounter medications on file as of 01/21/2024.    Review of Systems  Immunization History  Administered Date(s) Administered   Influenza, High Dose Seasonal PF 11/23/2022   Influenza-Unspecified 10/10/2023   Moderna Covid-19 Vaccine Bivalent Booster 28yrs & up 12/19/2023   PFIZER(Purple Top)SARS-COV-2 Vaccination 01/07/2020, 01/27/2020, 11/18/2020   PNEUMOCOCCAL CONJUGATE-20 12/14/2023   Pertinent  Health Maintenance Due  Topic Date Due   INFLUENZA VACCINE   Completed   DEXA SCAN  Discontinued      01/10/2024    1:26 PM  Fall Risk  Falls in the past year? 0  Was there an injury with Fall? 0  Fall Risk Category Calculator 0   Functional Status Survey:    Vitals:   01/21/24 1606  BP: 133/64  Pulse: 61  Resp: 17  Temp: 98 F (36.7 C)  SpO2: 96%  Weight: 167 lb 12.8 oz (76.1 kg)   Body mass index is 27.08 kg/m. Physical Exam  Physical Exam HEENT: Oral mucosa moist, eyes not injected or pink. CHEST: Lungs clear to auscultation. CARDIOVASCULAR: Heart rate normal. ABDOMEN: Abdomen non-tender on palpation.  Labs reviewed: Recent Labs    12/11/23 1601 12/12/23 0604 12/12/23 0605 12/13/23 0943 12/20/23 0000 12/27/23 0000  NA 136  --  137 136 139 139  K 3.2*  --  3.2* 3.6  3.5 3.4*  CL 98  --  99 101 101 101  CO2 26  --  27 26 27* 29*  GLUCOSE 139*  --  116* 145*  --   --   BUN 14  --  15 12 19 21   CREATININE 0.73  --  0.91 0.69 0.8 0.7  CALCIUM 9.1  --  8.8* 8.9 9.0 8.2*  MG  --  2.0  --   --   --   --    Recent Labs    03/13/23 0156 03/14/23 0217 03/29/23 0000 12/11/23 1601 12/20/23 0000  AST 29 20 15 22 15   ALT 13 10 6* 10 8  ALKPHOS 40 40 155* 74 110  BILITOT 0.4 0.8  --  1.0  --   PROT 5.8* 5.7*  --  7.1  --   ALBUMIN 2.8* 2.7* 2.7* 3.4* 3.1*   Recent Labs    03/15/23 0242 03/15/23 0926 04/30/23 0000 07/05/23 0000 12/11/23 1601 12/12/23 0605 12/20/23 0000  WBC 13.9*   < > 8.3   < > 9.4 10.6* 12.8  NEUTROABS  --    < > 4,930.00  --  6.7  --  9,562.00  HGB 8.8*   < > 12.8   < > 14.2 13.7 15.2  HCT 26.0*   < > 40   < > 43.2 40.3 46  MCV 93.2  --   --   --  92.9 89.6  --   PLT 250   < > 413*   < > 347 342 426*   < > = values in this interval not displayed.   Lab Results  Component Value Date   TSH 4.35 08/27/2023   Lab Results  Component Value Date   HGBA1C 6.5 (H) 12/12/2023   Lab Results  Component Value Date   CHOL 185 12/12/2023   HDL 32 (L) 12/12/2023   LDLCALC 130 (H) 12/12/2023    TRIG 117 12/12/2023   CHOLHDL 5.8 12/12/2023    Significant Diagnostic Results in last 30 days:  No results found.  Assessment/Plan Clostridium difficile infection Persistent Clostridium difficile infection with ongoing diarrhea (2-3 episodes/day). Partial response to oral vancomycin. Likely acquired during recent hospitalization. Discussed stronger medication options and potential fecal transplant. High cost of stronger medication noted. No signs of dehydration or abdominal tenderness. - Continue oral vancomycin QID x7 days, then BID x7 days, then daily x7 days then every 2 days for 4 weeks. Once patient is released from [facility], will consider fidaxomicin as an alternative treatment option.  - Coordinate with pharmacy and insurance for stronger medication options - Discuss remaining medication days and treatment options with social work - Update patient's daughter  Post-stroke limitations Significant post-stroke limitations affecting ambulation and unilateral hand function, impacting daily activities and self-care. - Support daily activities and self-care - Coordinate caregiver assistance for tasks like hair washing  Follow-up - Schedule follow-up to reassess infection and treatment efficacy - Maintain communication with patient's daughter and caregivers.  Family/ staff Communication: Nelva Bush, nursing  Labs/tests ordered:  BMP

## 2024-01-23 DIAGNOSIS — B351 Tinea unguium: Secondary | ICD-10-CM | POA: Diagnosis not present

## 2024-01-23 DIAGNOSIS — E1159 Type 2 diabetes mellitus with other circulatory complications: Secondary | ICD-10-CM | POA: Diagnosis not present

## 2024-01-25 DIAGNOSIS — R278 Other lack of coordination: Secondary | ICD-10-CM | POA: Diagnosis not present

## 2024-01-25 DIAGNOSIS — I2510 Atherosclerotic heart disease of native coronary artery without angina pectoris: Secondary | ICD-10-CM | POA: Diagnosis not present

## 2024-01-25 DIAGNOSIS — I130 Hypertensive heart and chronic kidney disease with heart failure and stage 1 through stage 4 chronic kidney disease, or unspecified chronic kidney disease: Secondary | ICD-10-CM | POA: Diagnosis not present

## 2024-01-25 DIAGNOSIS — I69398 Other sequelae of cerebral infarction: Secondary | ICD-10-CM | POA: Diagnosis not present

## 2024-01-25 DIAGNOSIS — M6281 Muscle weakness (generalized): Secondary | ICD-10-CM | POA: Diagnosis not present

## 2024-01-29 DIAGNOSIS — R278 Other lack of coordination: Secondary | ICD-10-CM | POA: Diagnosis not present

## 2024-01-29 DIAGNOSIS — I2510 Atherosclerotic heart disease of native coronary artery without angina pectoris: Secondary | ICD-10-CM | POA: Diagnosis not present

## 2024-01-29 DIAGNOSIS — I130 Hypertensive heart and chronic kidney disease with heart failure and stage 1 through stage 4 chronic kidney disease, or unspecified chronic kidney disease: Secondary | ICD-10-CM | POA: Diagnosis not present

## 2024-01-29 DIAGNOSIS — M6281 Muscle weakness (generalized): Secondary | ICD-10-CM | POA: Diagnosis not present

## 2024-01-29 DIAGNOSIS — I69398 Other sequelae of cerebral infarction: Secondary | ICD-10-CM | POA: Diagnosis not present

## 2024-01-30 DIAGNOSIS — M6281 Muscle weakness (generalized): Secondary | ICD-10-CM | POA: Diagnosis not present

## 2024-01-30 DIAGNOSIS — I130 Hypertensive heart and chronic kidney disease with heart failure and stage 1 through stage 4 chronic kidney disease, or unspecified chronic kidney disease: Secondary | ICD-10-CM | POA: Diagnosis not present

## 2024-01-30 DIAGNOSIS — I2510 Atherosclerotic heart disease of native coronary artery without angina pectoris: Secondary | ICD-10-CM | POA: Diagnosis not present

## 2024-01-30 DIAGNOSIS — R278 Other lack of coordination: Secondary | ICD-10-CM | POA: Diagnosis not present

## 2024-01-30 DIAGNOSIS — I69398 Other sequelae of cerebral infarction: Secondary | ICD-10-CM | POA: Diagnosis not present

## 2024-02-01 DIAGNOSIS — I69398 Other sequelae of cerebral infarction: Secondary | ICD-10-CM | POA: Diagnosis not present

## 2024-02-01 DIAGNOSIS — M6281 Muscle weakness (generalized): Secondary | ICD-10-CM | POA: Diagnosis not present

## 2024-02-01 DIAGNOSIS — I2510 Atherosclerotic heart disease of native coronary artery without angina pectoris: Secondary | ICD-10-CM | POA: Diagnosis not present

## 2024-02-01 DIAGNOSIS — I130 Hypertensive heart and chronic kidney disease with heart failure and stage 1 through stage 4 chronic kidney disease, or unspecified chronic kidney disease: Secondary | ICD-10-CM | POA: Diagnosis not present

## 2024-02-01 DIAGNOSIS — R278 Other lack of coordination: Secondary | ICD-10-CM | POA: Diagnosis not present

## 2024-02-02 DIAGNOSIS — M6281 Muscle weakness (generalized): Secondary | ICD-10-CM | POA: Diagnosis not present

## 2024-02-02 DIAGNOSIS — I69398 Other sequelae of cerebral infarction: Secondary | ICD-10-CM | POA: Diagnosis not present

## 2024-02-02 DIAGNOSIS — I2510 Atherosclerotic heart disease of native coronary artery without angina pectoris: Secondary | ICD-10-CM | POA: Diagnosis not present

## 2024-02-02 DIAGNOSIS — R278 Other lack of coordination: Secondary | ICD-10-CM | POA: Diagnosis not present

## 2024-02-02 DIAGNOSIS — I130 Hypertensive heart and chronic kidney disease with heart failure and stage 1 through stage 4 chronic kidney disease, or unspecified chronic kidney disease: Secondary | ICD-10-CM | POA: Diagnosis not present

## 2024-02-04 ENCOUNTER — Encounter: Payer: Self-pay | Admitting: Student

## 2024-02-04 DIAGNOSIS — A0471 Enterocolitis due to Clostridium difficile, recurrent: Secondary | ICD-10-CM

## 2024-02-05 ENCOUNTER — Other Ambulatory Visit: Payer: Self-pay | Admitting: Nurse Practitioner

## 2024-02-05 ENCOUNTER — Encounter: Payer: Self-pay | Admitting: Student

## 2024-02-05 DIAGNOSIS — F1320 Sedative, hypnotic or anxiolytic dependence, uncomplicated: Secondary | ICD-10-CM

## 2024-02-05 MED ORDER — ALPRAZOLAM 0.5 MG PO TABS: 0.5000 mg | ORAL_TABLET | Freq: Every day | ORAL | 0 refills | Status: DC | PRN

## 2024-02-05 NOTE — Progress Notes (Signed)
 This encounter was created in error - please disregard.

## 2024-02-06 DIAGNOSIS — M6281 Muscle weakness (generalized): Secondary | ICD-10-CM | POA: Diagnosis not present

## 2024-02-06 DIAGNOSIS — I2510 Atherosclerotic heart disease of native coronary artery without angina pectoris: Secondary | ICD-10-CM | POA: Diagnosis not present

## 2024-02-06 DIAGNOSIS — R278 Other lack of coordination: Secondary | ICD-10-CM | POA: Diagnosis not present

## 2024-02-06 DIAGNOSIS — I130 Hypertensive heart and chronic kidney disease with heart failure and stage 1 through stage 4 chronic kidney disease, or unspecified chronic kidney disease: Secondary | ICD-10-CM | POA: Diagnosis not present

## 2024-02-06 DIAGNOSIS — I69398 Other sequelae of cerebral infarction: Secondary | ICD-10-CM | POA: Diagnosis not present

## 2024-02-07 DIAGNOSIS — I2510 Atherosclerotic heart disease of native coronary artery without angina pectoris: Secondary | ICD-10-CM | POA: Diagnosis not present

## 2024-02-07 DIAGNOSIS — I69398 Other sequelae of cerebral infarction: Secondary | ICD-10-CM | POA: Diagnosis not present

## 2024-02-07 DIAGNOSIS — M6281 Muscle weakness (generalized): Secondary | ICD-10-CM | POA: Diagnosis not present

## 2024-02-07 DIAGNOSIS — R278 Other lack of coordination: Secondary | ICD-10-CM | POA: Diagnosis not present

## 2024-02-07 DIAGNOSIS — I130 Hypertensive heart and chronic kidney disease with heart failure and stage 1 through stage 4 chronic kidney disease, or unspecified chronic kidney disease: Secondary | ICD-10-CM | POA: Diagnosis not present

## 2024-02-09 DIAGNOSIS — I130 Hypertensive heart and chronic kidney disease with heart failure and stage 1 through stage 4 chronic kidney disease, or unspecified chronic kidney disease: Secondary | ICD-10-CM | POA: Diagnosis not present

## 2024-02-09 DIAGNOSIS — I69398 Other sequelae of cerebral infarction: Secondary | ICD-10-CM | POA: Diagnosis not present

## 2024-02-09 DIAGNOSIS — R278 Other lack of coordination: Secondary | ICD-10-CM | POA: Diagnosis not present

## 2024-02-09 DIAGNOSIS — I2510 Atherosclerotic heart disease of native coronary artery without angina pectoris: Secondary | ICD-10-CM | POA: Diagnosis not present

## 2024-02-09 DIAGNOSIS — M6281 Muscle weakness (generalized): Secondary | ICD-10-CM | POA: Diagnosis not present

## 2024-02-12 DIAGNOSIS — M6281 Muscle weakness (generalized): Secondary | ICD-10-CM | POA: Diagnosis not present

## 2024-02-12 DIAGNOSIS — I2510 Atherosclerotic heart disease of native coronary artery without angina pectoris: Secondary | ICD-10-CM | POA: Diagnosis not present

## 2024-02-12 DIAGNOSIS — R278 Other lack of coordination: Secondary | ICD-10-CM | POA: Diagnosis not present

## 2024-02-12 DIAGNOSIS — I130 Hypertensive heart and chronic kidney disease with heart failure and stage 1 through stage 4 chronic kidney disease, or unspecified chronic kidney disease: Secondary | ICD-10-CM | POA: Diagnosis not present

## 2024-02-12 DIAGNOSIS — I69398 Other sequelae of cerebral infarction: Secondary | ICD-10-CM | POA: Diagnosis not present

## 2024-02-13 DIAGNOSIS — M6281 Muscle weakness (generalized): Secondary | ICD-10-CM | POA: Diagnosis not present

## 2024-02-13 DIAGNOSIS — I2510 Atherosclerotic heart disease of native coronary artery without angina pectoris: Secondary | ICD-10-CM | POA: Diagnosis not present

## 2024-02-13 DIAGNOSIS — F331 Major depressive disorder, recurrent, moderate: Secondary | ICD-10-CM | POA: Diagnosis not present

## 2024-02-13 DIAGNOSIS — F5105 Insomnia due to other mental disorder: Secondary | ICD-10-CM | POA: Diagnosis not present

## 2024-02-13 DIAGNOSIS — F419 Anxiety disorder, unspecified: Secondary | ICD-10-CM | POA: Diagnosis not present

## 2024-02-13 DIAGNOSIS — R278 Other lack of coordination: Secondary | ICD-10-CM | POA: Diagnosis not present

## 2024-02-13 DIAGNOSIS — I69398 Other sequelae of cerebral infarction: Secondary | ICD-10-CM | POA: Diagnosis not present

## 2024-02-13 DIAGNOSIS — I69315 Cognitive social or emotional deficit following cerebral infarction: Secondary | ICD-10-CM | POA: Diagnosis not present

## 2024-02-13 DIAGNOSIS — I130 Hypertensive heart and chronic kidney disease with heart failure and stage 1 through stage 4 chronic kidney disease, or unspecified chronic kidney disease: Secondary | ICD-10-CM | POA: Diagnosis not present

## 2024-02-14 ENCOUNTER — Non-Acute Institutional Stay (SKILLED_NURSING_FACILITY): Payer: Self-pay | Admitting: Nurse Practitioner

## 2024-02-14 ENCOUNTER — Encounter: Payer: Self-pay | Admitting: Nurse Practitioner

## 2024-02-14 DIAGNOSIS — A0472 Enterocolitis due to Clostridium difficile, not specified as recurrent: Secondary | ICD-10-CM | POA: Insufficient documentation

## 2024-02-14 DIAGNOSIS — M150 Primary generalized (osteo)arthritis: Secondary | ICD-10-CM | POA: Diagnosis not present

## 2024-02-14 DIAGNOSIS — I10 Essential (primary) hypertension: Secondary | ICD-10-CM

## 2024-02-14 DIAGNOSIS — N1831 Chronic kidney disease, stage 3a: Secondary | ICD-10-CM | POA: Diagnosis not present

## 2024-02-14 DIAGNOSIS — I2510 Atherosclerotic heart disease of native coronary artery without angina pectoris: Secondary | ICD-10-CM | POA: Diagnosis not present

## 2024-02-14 DIAGNOSIS — I5032 Chronic diastolic (congestive) heart failure: Secondary | ICD-10-CM

## 2024-02-14 DIAGNOSIS — R739 Hyperglycemia, unspecified: Secondary | ICD-10-CM

## 2024-02-14 DIAGNOSIS — M6281 Muscle weakness (generalized): Secondary | ICD-10-CM | POA: Diagnosis not present

## 2024-02-14 DIAGNOSIS — F5101 Primary insomnia: Secondary | ICD-10-CM

## 2024-02-14 DIAGNOSIS — I4821 Permanent atrial fibrillation: Secondary | ICD-10-CM

## 2024-02-14 DIAGNOSIS — I872 Venous insufficiency (chronic) (peripheral): Secondary | ICD-10-CM

## 2024-02-14 DIAGNOSIS — E785 Hyperlipidemia, unspecified: Secondary | ICD-10-CM

## 2024-02-14 DIAGNOSIS — R278 Other lack of coordination: Secondary | ICD-10-CM | POA: Diagnosis not present

## 2024-02-14 DIAGNOSIS — A0471 Enterocolitis due to Clostridium difficile, recurrent: Secondary | ICD-10-CM | POA: Insufficient documentation

## 2024-02-14 DIAGNOSIS — I69354 Hemiplegia and hemiparesis following cerebral infarction affecting left non-dominant side: Secondary | ICD-10-CM

## 2024-02-14 DIAGNOSIS — I130 Hypertensive heart and chronic kidney disease with heart failure and stage 1 through stage 4 chronic kidney disease, or unspecified chronic kidney disease: Secondary | ICD-10-CM | POA: Diagnosis not present

## 2024-02-14 DIAGNOSIS — Z7901 Long term (current) use of anticoagulants: Secondary | ICD-10-CM

## 2024-02-14 DIAGNOSIS — I69398 Other sequelae of cerebral infarction: Secondary | ICD-10-CM | POA: Diagnosis not present

## 2024-02-14 DIAGNOSIS — F1320 Sedative, hypnotic or anxiolytic dependence, uncomplicated: Secondary | ICD-10-CM

## 2024-02-14 HISTORY — DX: Enterocolitis due to Clostridium difficile, not specified as recurrent: A04.72

## 2024-02-14 NOTE — Assessment & Plan Note (Signed)
 Stable, continues on eliquis and crestor. Continues with staff support due to weakness after CVA

## 2024-02-14 NOTE — Assessment & Plan Note (Signed)
 Following A1c

## 2024-02-14 NOTE — Assessment & Plan Note (Signed)
 Stable, without chest pains at this time. Continues on metoprolol.

## 2024-02-14 NOTE — Assessment & Plan Note (Signed)
 Blood pressure well controlled, goal bp <140/90 Continue current medications and dietary modifications follow metabolic panel

## 2024-02-14 NOTE — Assessment & Plan Note (Signed)
 Stable, continues on xanax at bedtime scheduled with zoloft 100 mg daily.

## 2024-02-14 NOTE — Assessment & Plan Note (Signed)
 Ongoing, continues on xanax at bedtime.

## 2024-02-14 NOTE — Assessment & Plan Note (Signed)
 Euvolemic at this time, continues on hydralazine, metoprolol, losartan and lasix

## 2024-02-14 NOTE — Assessment & Plan Note (Signed)
 Stable at this time, continues on tylenol and tramadol PRN pain.

## 2024-02-14 NOTE — Assessment & Plan Note (Signed)
 Ongoing diarrhea despite vancomycin. C diff positive in January and diarrhea getting worse.  Stop Vanc and start Dificid 200 mg by mouth twice daily for 10 days at this time. Continue supportive care.

## 2024-02-14 NOTE — Assessment & Plan Note (Signed)
 Chronic and stable Encourage proper hydration Follow metabolic panel Avoid nephrotoxic meds (NSAIDS)

## 2024-02-14 NOTE — Progress Notes (Signed)
 Location:  Other [place].  Nursing Home Room Number: [facility] [address]A Place of Service:  SNF [phone]) [name], NP  PCP: [name], MD  Patient Care Team: [name], MD as PCP - General (Family Medicine) [name], MD (Inactive) as PCP - Cardiology (Cardiology) [name], MD as PCP - Electrophysiology (Cardiology)  Extended Emergency Contact Information Primary Emergency Contact: Sofie Rower [place] Home Phone: [phone] Relation: Daughter Secondary Emergency Contact: Mottern,Danny Mobile Phone: [phone] Relation: Son  Goals of care: Advanced Directive information    01/10/2024   10:12 AM  Advanced Directives  Does Patient Have a Medical Advance Directive? Yes  Type of Advance Directive Out of facility DNR (pink MOST or yellow form)  Does patient want to make changes to medical advance directive? No - Patient declined     Chief Complaint  Patient presents with   Medical Management of Chronic Issues    Medical Management of Chronic Issues.     HPI:  Pt is a 89 y.o. female seen today for medical management of chronic disease.  Pt has had ongoing diarrhea associated with c difficile infection  She has hx of CVA and was admitted to hospital for this late December. Since hospitalization mobility has declined and memory has worsened.  She has been on vancomycin ongoing for c diff infection. Staff felt like diarrhea was improving but today has had 4 episodes today but previously continued to still having multiple episodes of diarrhea a day. She tested positive for c diff at the end of January and diarrhea has persisted despite vanc use.  Reports her bottom is sore and staff reports redness.  No increase in her anxiety- she has chronic anxiety which is managed by xanax.  No reports of GERD Blood pressure has been controlled Her weight remains stable.    Past Medical History:  Diagnosis Date   Back pain     Bruises easily    Coronary artery disease    NONOBSTRUCTIVE   Fatigue    Hyperlipidemia    Hypertension    Knee pain    OA (osteoarthritis)    Obesity    Palpitations    SOB (shortness of breath)    Past Surgical History:  Procedure Laterality Date   BREAST BIOPSY     LEFT BREAST   CARDIAC CATHETERIZATION  05/11/2006   OVERALL CARDIAC SIZE AND SILHOUETTE ARE NORMAL. EF ESTIMATED 60%   INTRAMEDULLARY (IM) NAIL INTERTROCHANTERIC Left 03/12/2023   Procedure: INTRAMEDULLARY (IM) NAIL INTERTROCHANTERIC;  Surgeon: Roby Lofts, MD;  Location: [facility] OR;  Service: Orthopedics;  Laterality: Left;   KIDNEY STONES  1982   KNEE ARTHROSCOPY  09/22/2008   VAGINAL HYSTERECTOMY      Allergies  Allergen Reactions   Codeine Other (See Comments)    Unknown reaction   Demerol [Meperidine Hcl] Other (See Comments)    Unknown reaction   Morphine And Codeine Other (See Comments)    Unknown reaction    Outpatient Encounter Medications as of 02/14/2024  Medication Sig   acetaminophen (TYLENOL) 325 MG tablet Take 650 mg by mouth every 6 (six) hours as needed.   ALPRAZolam (XANAX) 0.5 MG tablet Take 1 tablet (0.5 mg total) by mouth daily as needed for anxiety. Give one tablet by mouth every 24 hours as needed at bedtime.   ALPRAZolam (XANAX) 1 MG tablet Take 1 tablet (1 mg total) by mouth at bedtime.   apixaban (ELIQUIS) 5 MG TABS tablet Take  1 tablet (5 mg total) by mouth 2 (two) times daily.   Cholecalciferol (VITAMIN D-3) 25 MCG (1000 UT) CAPS One by mouth daily.   estradiol (ESTRACE) 0.1 MG/GM vaginal cream Place 1 Applicatorful vaginally. At bedtime every Tuesday, Friday, and Sunday.   famotidine (PEPCID) 20 MG tablet Take 20 mg by mouth at bedtime.   furosemide (LASIX) 40 MG tablet Take 40 mg by mouth daily. Can give extra 40 mg tablet as needed for weight gain of 3 lbs overnight or 6 lbs in a week.   hydrALAZINE (APRESOLINE) 25 MG tablet Take 1 tablet (25 mg total) by mouth 2 (two) times daily.    hydrocortisone (ANUSOL-HC) 25 MG suppository Place 25 mg rectally every 12 (twelve) hours as needed for hemorrhoids.   losartan (COZAAR) 50 MG tablet Take 50 mg by mouth daily.   metoprolol tartrate (LOPRESSOR) 100 MG tablet Take 100 mg by mouth 2 (two) times daily.   metoprolol tartrate (LOPRESSOR) 50 MG tablet Take 50 mg by mouth daily as needed (Palpitations).   nitroGLYCERIN (NITROSTAT) 0.4 MG SL tablet Place 1 tablet (0.4 mg total) under the tongue every 5 (five) minutes as needed for chest pain.   ondansetron (ZOFRAN) 4 MG tablet Take 4 mg by mouth every 8 (eight) hours as needed for nausea or vomiting.   polyethylene glycol (MIRALAX / GLYCOLAX) 17 g packet Take 17 g by mouth daily as needed.   rosuvastatin (CRESTOR) 10 MG tablet Take 1 tablet (10 mg total) by mouth daily.   sertraline (ZOLOFT) 100 MG tablet Take 100 mg by mouth daily.   traMADol (ULTRAM) 50 MG tablet Take 1 tablet (50 mg total) by mouth every 6 (six) hours as needed. Give 2 tablets by mouth every 6 hours as needed for severe pain.   vancomycin (VANCOCIN) 125 MG capsule Take 125 mg by mouth. Every other day   Zinc Oxide (TRIPLE PASTE) 12.8 % ointment Apply 1 Application topically. To peri area and skin folds topically twice daily   No facility-administered encounter medications on file as of 02/14/2024.    Review of Systems  Unable to perform ROS: Dementia     Immunization History  Administered Date(s) Administered   Influenza, High Dose Seasonal PF 11/23/2022   Influenza-Unspecified 10/10/2023   Moderna Covid-19 Vaccine Bivalent Booster 105yrs & up 12/19/2023   PFIZER(Purple Top)SARS-COV-2 Vaccination 01/07/2020, 01/27/2020, 11/18/2020   PNEUMOCOCCAL CONJUGATE-20 12/14/2023   Pertinent  Health Maintenance Due  Topic Date Due   INFLUENZA VACCINE  Completed   DEXA SCAN  Discontinued      01/10/2024    1:26 PM  Fall Risk  Falls in the past year? 0  Was there an injury with Fall? 0  Fall Risk Category  Calculator 0   Functional Status Survey:    Vitals:   02/14/24 1139  BP: 123/63  Pulse: 66  Resp: 18  Temp: (!) 97.2 F (36.2 C)  SpO2: 92%  Weight: 170 lb 12.8 oz (77.5 kg)  Height: 5\' 6"  (1.676 m)   Body mass index is 27.57 kg/m. Physical Exam Constitutional:      General: She is not in acute distress.    Appearance: She is well-developed. She is not diaphoretic.  HENT:     Head: Normocephalic and atraumatic.     Mouth/Throat:     Pharynx: No oropharyngeal exudate.  Eyes:     Conjunctiva/sclera: Conjunctivae normal.     Pupils: Pupils are equal, round, and reactive to light.  Cardiovascular:  Rate and Rhythm: Normal rate and regular rhythm.     Heart sounds: Normal heart sounds.  Pulmonary:     Effort: Pulmonary effort is normal.     Breath sounds: Normal breath sounds.  Abdominal:     General: Bowel sounds are normal.     Palpations: Abdomen is soft.  Musculoskeletal:     Cervical back: Normal range of motion and neck supple.     Right lower leg: Edema (1+) present.     Left lower leg: Edema (1+) present.  Skin:    General: Skin is warm and dry.  Neurological:     Mental Status: She is alert. She is disoriented.     Motor: Weakness present.     Gait: Gait abnormal.  Psychiatric:        Mood and Affect: Mood normal.     Labs reviewed: Recent Labs    12/11/23 1601 12/12/23 0604 12/12/23 0605 12/13/23 0943 12/20/23 0000 12/27/23 0000 01/10/24 0000 01/21/24 0000  NA 136  --  137 136   < > 139 134* 137  K 3.2*  --  3.2* 3.6   < > 3.4* 3.8 3.4*  CL 98  --  99 101   < > 101 101 100  CO2 26  --  27 26   < > 29* 30* 31*  GLUCOSE 139*  --  116* 145*  --   --   --   --   BUN 14  --  15 12   < > 21 20 19   CREATININE 0.73  --  0.91 0.69   < > 0.7 0.6 0.7  CALCIUM 9.1  --  8.8* 8.9   < > 8.2* 8.3* 8.5*  MG  --  2.0  --   --   --   --   --   --    < > = values in this interval not displayed.   Recent Labs    03/13/23 0156 03/14/23 0217  03/29/23 0000 12/11/23 1601 12/20/23 0000  AST 29 20 15 22 15   ALT 13 10 6* 10 8  ALKPHOS 40 40 155* 74 110  BILITOT 0.4 0.8  --  1.0  --   PROT 5.8* 5.7*  --  7.1  --   ALBUMIN 2.8* 2.7* 2.7* 3.4* 3.1*   Recent Labs    03/15/23 0242 03/15/23 0926 04/30/23 0000 07/05/23 0000 12/11/23 1601 12/12/23 0605 12/20/23 0000 01/21/24 0000  WBC 13.9*   < > 8.3   < > 9.4 10.6* 12.8 10.0  NEUTROABS  --    < > 4,930.00  --  6.7  --  9,562.00  --   HGB 8.8*   < > 12.8   < > 14.2 13.7 15.2 13.0  HCT 26.0*   < > 40   < > 43.2 40.3 46 40  MCV 93.2  --   --   --  92.9 89.6  --   --   PLT 250   < > 413*   < > 347 342 426* 303   < > = values in this interval not displayed.   Lab Results  Component Value Date   TSH 4.35 08/27/2023   Lab Results  Component Value Date   HGBA1C 6.5 (H) 12/12/2023   Lab Results  Component Value Date   CHOL 185 12/12/2023   HDL 32 (L) 12/12/2023   LDLCALC 130 (H) 12/12/2023   TRIG 117 12/12/2023   CHOLHDL 5.8 12/12/2023  Significant Diagnostic Results in last 30 days:  No results found.  Assessment/Plan OA (osteoarthritis) Stable at this time, continues on tylenol and tramadol PRN pain.   Permanent atrial fibrillation (HCC) Rate controlled on lopressor 100 mg BID, can have additional 50 mg PRN. Continues on eliquis for anticoagulation.   Stage 3a chronic kidney disease (CKD) (HCC) Chronic and stable Encourage proper hydration Follow metabolic panel Avoid nephrotoxic meds (NSAIDS)  Chronic heart failure with preserved ejection fraction (HFpEF) (HCC) Euvolemic at this time, continues on hydralazine, metoprolol, losartan and lasix   Benzodiazepine dependence (HCC) Ongoing, continues on xanax at bedtime.   Insomnia/Anxiety Stable, continues on xanax at bedtime scheduled with zoloft 100 mg daily.  Venous insufficiency of both lower extremities Stable, continues on lasix scheduled. Also has legs elevated during the day.   Hemiparesis of  left nondominant side as late effect of cerebral infarction (HCC) Stable, continues on eliquis and crestor. Continues with staff support due to weakness after CVA  Hypertension Blood pressure well controlled, goal bp <140/90 Continue current medications and dietary modifications follow metabolic panel  Hyperlipidemia Continues on crestor.   Coronary artery disease Stable, without chest pains at this time. Continues on metoprolol.   Hyperglycemia Following A1c  C. difficile diarrhea Ongoing diarrhea despite vancomycin. C diff positive in January and diarrhea getting worse.  Stop Vanc and start Dificid 200 mg by mouth twice daily for 10 days at this time. Continue supportive care.    Janene Harvey. [facility]ag[facility] [phone]

## 2024-02-14 NOTE — Assessment & Plan Note (Signed)
 Continues on crestor

## 2024-02-14 NOTE — Assessment & Plan Note (Signed)
 Rate controlled on lopressor 100 mg BID, can have additional 50 mg PRN. Continues on eliquis for anticoagulation.

## 2024-02-14 NOTE — Assessment & Plan Note (Signed)
 Stable, continues on lasix scheduled. Also has legs elevated during the day.

## 2024-02-15 DIAGNOSIS — I130 Hypertensive heart and chronic kidney disease with heart failure and stage 1 through stage 4 chronic kidney disease, or unspecified chronic kidney disease: Secondary | ICD-10-CM | POA: Diagnosis not present

## 2024-02-15 DIAGNOSIS — I69398 Other sequelae of cerebral infarction: Secondary | ICD-10-CM | POA: Diagnosis not present

## 2024-02-15 DIAGNOSIS — R278 Other lack of coordination: Secondary | ICD-10-CM | POA: Diagnosis not present

## 2024-02-15 DIAGNOSIS — M6281 Muscle weakness (generalized): Secondary | ICD-10-CM | POA: Diagnosis not present

## 2024-02-15 DIAGNOSIS — I2510 Atherosclerotic heart disease of native coronary artery without angina pectoris: Secondary | ICD-10-CM | POA: Diagnosis not present

## 2024-02-16 DIAGNOSIS — I2510 Atherosclerotic heart disease of native coronary artery without angina pectoris: Secondary | ICD-10-CM | POA: Diagnosis not present

## 2024-02-16 DIAGNOSIS — I130 Hypertensive heart and chronic kidney disease with heart failure and stage 1 through stage 4 chronic kidney disease, or unspecified chronic kidney disease: Secondary | ICD-10-CM | POA: Diagnosis not present

## 2024-02-16 DIAGNOSIS — M6281 Muscle weakness (generalized): Secondary | ICD-10-CM | POA: Diagnosis not present

## 2024-02-16 DIAGNOSIS — I69398 Other sequelae of cerebral infarction: Secondary | ICD-10-CM | POA: Diagnosis not present

## 2024-02-16 DIAGNOSIS — R278 Other lack of coordination: Secondary | ICD-10-CM | POA: Diagnosis not present

## 2024-02-19 DIAGNOSIS — M6281 Muscle weakness (generalized): Secondary | ICD-10-CM | POA: Diagnosis not present

## 2024-02-19 DIAGNOSIS — I2510 Atherosclerotic heart disease of native coronary artery without angina pectoris: Secondary | ICD-10-CM | POA: Diagnosis not present

## 2024-02-19 DIAGNOSIS — I69398 Other sequelae of cerebral infarction: Secondary | ICD-10-CM | POA: Diagnosis not present

## 2024-02-19 DIAGNOSIS — R278 Other lack of coordination: Secondary | ICD-10-CM | POA: Diagnosis not present

## 2024-02-19 DIAGNOSIS — I130 Hypertensive heart and chronic kidney disease with heart failure and stage 1 through stage 4 chronic kidney disease, or unspecified chronic kidney disease: Secondary | ICD-10-CM | POA: Diagnosis not present

## 2024-02-20 DIAGNOSIS — M6281 Muscle weakness (generalized): Secondary | ICD-10-CM | POA: Diagnosis not present

## 2024-02-20 DIAGNOSIS — R278 Other lack of coordination: Secondary | ICD-10-CM | POA: Diagnosis not present

## 2024-02-20 DIAGNOSIS — I130 Hypertensive heart and chronic kidney disease with heart failure and stage 1 through stage 4 chronic kidney disease, or unspecified chronic kidney disease: Secondary | ICD-10-CM | POA: Diagnosis not present

## 2024-02-20 DIAGNOSIS — I2510 Atherosclerotic heart disease of native coronary artery without angina pectoris: Secondary | ICD-10-CM | POA: Diagnosis not present

## 2024-02-20 DIAGNOSIS — I69398 Other sequelae of cerebral infarction: Secondary | ICD-10-CM | POA: Diagnosis not present

## 2024-02-21 DIAGNOSIS — M6281 Muscle weakness (generalized): Secondary | ICD-10-CM | POA: Diagnosis not present

## 2024-02-21 DIAGNOSIS — R278 Other lack of coordination: Secondary | ICD-10-CM | POA: Diagnosis not present

## 2024-02-21 DIAGNOSIS — I69398 Other sequelae of cerebral infarction: Secondary | ICD-10-CM | POA: Diagnosis not present

## 2024-02-21 DIAGNOSIS — I2510 Atherosclerotic heart disease of native coronary artery without angina pectoris: Secondary | ICD-10-CM | POA: Diagnosis not present

## 2024-02-21 DIAGNOSIS — I130 Hypertensive heart and chronic kidney disease with heart failure and stage 1 through stage 4 chronic kidney disease, or unspecified chronic kidney disease: Secondary | ICD-10-CM | POA: Diagnosis not present

## 2024-02-22 DIAGNOSIS — I69398 Other sequelae of cerebral infarction: Secondary | ICD-10-CM | POA: Diagnosis not present

## 2024-02-22 DIAGNOSIS — M6281 Muscle weakness (generalized): Secondary | ICD-10-CM | POA: Diagnosis not present

## 2024-02-22 DIAGNOSIS — I2510 Atherosclerotic heart disease of native coronary artery without angina pectoris: Secondary | ICD-10-CM | POA: Diagnosis not present

## 2024-02-22 DIAGNOSIS — I130 Hypertensive heart and chronic kidney disease with heart failure and stage 1 through stage 4 chronic kidney disease, or unspecified chronic kidney disease: Secondary | ICD-10-CM | POA: Diagnosis not present

## 2024-02-22 DIAGNOSIS — R278 Other lack of coordination: Secondary | ICD-10-CM | POA: Diagnosis not present

## 2024-02-25 DIAGNOSIS — I130 Hypertensive heart and chronic kidney disease with heart failure and stage 1 through stage 4 chronic kidney disease, or unspecified chronic kidney disease: Secondary | ICD-10-CM | POA: Diagnosis not present

## 2024-02-25 DIAGNOSIS — I69398 Other sequelae of cerebral infarction: Secondary | ICD-10-CM | POA: Diagnosis not present

## 2024-02-25 DIAGNOSIS — M6281 Muscle weakness (generalized): Secondary | ICD-10-CM | POA: Diagnosis not present

## 2024-02-25 DIAGNOSIS — I2510 Atherosclerotic heart disease of native coronary artery without angina pectoris: Secondary | ICD-10-CM | POA: Diagnosis not present

## 2024-02-25 DIAGNOSIS — R278 Other lack of coordination: Secondary | ICD-10-CM | POA: Diagnosis not present

## 2024-02-26 DIAGNOSIS — I2510 Atherosclerotic heart disease of native coronary artery without angina pectoris: Secondary | ICD-10-CM | POA: Diagnosis not present

## 2024-02-26 DIAGNOSIS — I130 Hypertensive heart and chronic kidney disease with heart failure and stage 1 through stage 4 chronic kidney disease, or unspecified chronic kidney disease: Secondary | ICD-10-CM | POA: Diagnosis not present

## 2024-02-26 DIAGNOSIS — I69398 Other sequelae of cerebral infarction: Secondary | ICD-10-CM | POA: Diagnosis not present

## 2024-02-26 DIAGNOSIS — A0471 Enterocolitis due to Clostridium difficile, recurrent: Secondary | ICD-10-CM | POA: Diagnosis not present

## 2024-02-26 DIAGNOSIS — R278 Other lack of coordination: Secondary | ICD-10-CM | POA: Diagnosis not present

## 2024-02-26 DIAGNOSIS — M6281 Muscle weakness (generalized): Secondary | ICD-10-CM | POA: Diagnosis not present

## 2024-02-27 DIAGNOSIS — M6281 Muscle weakness (generalized): Secondary | ICD-10-CM | POA: Diagnosis not present

## 2024-02-27 DIAGNOSIS — I130 Hypertensive heart and chronic kidney disease with heart failure and stage 1 through stage 4 chronic kidney disease, or unspecified chronic kidney disease: Secondary | ICD-10-CM | POA: Diagnosis not present

## 2024-02-27 DIAGNOSIS — R278 Other lack of coordination: Secondary | ICD-10-CM | POA: Diagnosis not present

## 2024-02-27 DIAGNOSIS — I69398 Other sequelae of cerebral infarction: Secondary | ICD-10-CM | POA: Diagnosis not present

## 2024-02-27 DIAGNOSIS — I2510 Atherosclerotic heart disease of native coronary artery without angina pectoris: Secondary | ICD-10-CM | POA: Diagnosis not present

## 2024-03-03 DIAGNOSIS — R278 Other lack of coordination: Secondary | ICD-10-CM | POA: Diagnosis not present

## 2024-03-03 DIAGNOSIS — M6281 Muscle weakness (generalized): Secondary | ICD-10-CM | POA: Diagnosis not present

## 2024-03-03 DIAGNOSIS — I69398 Other sequelae of cerebral infarction: Secondary | ICD-10-CM | POA: Diagnosis not present

## 2024-03-03 DIAGNOSIS — I130 Hypertensive heart and chronic kidney disease with heart failure and stage 1 through stage 4 chronic kidney disease, or unspecified chronic kidney disease: Secondary | ICD-10-CM | POA: Diagnosis not present

## 2024-03-03 DIAGNOSIS — I2510 Atherosclerotic heart disease of native coronary artery without angina pectoris: Secondary | ICD-10-CM | POA: Diagnosis not present

## 2024-03-04 ENCOUNTER — Encounter: Payer: Self-pay | Admitting: Nurse Practitioner

## 2024-03-04 ENCOUNTER — Non-Acute Institutional Stay (SKILLED_NURSING_FACILITY): Payer: Self-pay | Admitting: Nurse Practitioner

## 2024-03-04 DIAGNOSIS — I82492 Acute embolism and thrombosis of other specified deep vein of left lower extremity: Secondary | ICD-10-CM | POA: Diagnosis not present

## 2024-03-04 DIAGNOSIS — M7989 Other specified soft tissue disorders: Secondary | ICD-10-CM

## 2024-03-04 NOTE — Progress Notes (Signed)
 Location:  Other [facility]) Nursing Home Room Number: [phone] [id number] Place of Service:  SNF (31)  Jaylei Fuerte K. Janyth Contes, NP   Patient Care Team: [name], MD as PCP - General (Family Medicine) [name], MD (Inactive) as PCP - Cardiology (Cardiology) [name], MD as PCP - Electrophysiology (Cardiology)  Extended Emergency Contact Information Primary Emergency Contact: Sofie Rower [place] Home Phone: [phone] Relation: Daughter Secondary Emergency Contact: Hedman,Danny Mobile Phone: [phone] Relation: Son  Goals of care: Advanced Directive information    01/10/2024   10:12 AM  Advanced Directives  Does Patient Have a Medical Advance Directive? Yes  Type of Advance Directive Out of facility DNR (pink MOST or yellow form)  Does patient want to make changes to medical advance directive? No - Patient declined     Chief Complaint  Patient presents with   Edema    Swollen hand     HPI:  Pt is a 89 y.o. female seen today for an acute visit for left hand and arm swelling Pt with hx of CVA with left sided hemiparesis. Nursing noted some swelling to left upper extremity this morning but by lunchtime area was much worse. Pt also on eliquis due to a fib, no abnormal bruising or bleeding.  Reports some pain to finger tips but denies injury.  No redness noted  Recently completed treatment for c diff and diarrhea has improved.    Past Medical History:  Diagnosis Date   Back pain    Bruises easily    Coronary artery disease    NONOBSTRUCTIVE   Fatigue    Hyperlipidemia    Hypertension    Knee pain    OA (osteoarthritis)    Obesity    Palpitations    SOB (shortness of breath)    Past Surgical History:  Procedure Laterality Date   BREAST BIOPSY     LEFT BREAST   CARDIAC CATHETERIZATION  05/11/2006   OVERALL CARDIAC SIZE AND SILHOUETTE ARE NORMAL. EF ESTIMATED 60%   INTRAMEDULLARY (IM) NAIL INTERTROCHANTERIC Left 03/12/2023    Procedure: INTRAMEDULLARY (IM) NAIL INTERTROCHANTERIC;  Surgeon: Roby Lofts, MD;  Location: [facility] OR;  Service: Orthopedics;  Laterality: Left;   KIDNEY STONES  1982   KNEE ARTHROSCOPY  09/22/2008   VAGINAL HYSTERECTOMY      Allergies  Allergen Reactions   Codeine Other (See Comments)    Unknown reaction   Demerol [Meperidine Hcl] Other (See Comments)    Unknown reaction   Morphine And Codeine Other (See Comments)    Unknown reaction    Outpatient Encounter Medications as of 03/04/2024  Medication Sig   acetaminophen (TYLENOL) 325 MG tablet Take 650 mg by mouth every 6 (six) hours as needed.   ALPRAZolam (XANAX) 0.5 MG tablet Take 1 tablet (0.5 mg total) by mouth daily as needed for anxiety. Give one tablet by mouth every 24 hours as needed at bedtime.   ALPRAZolam (XANAX) 1 MG tablet Take 1 tablet (1 mg total) by mouth at bedtime.   apixaban (ELIQUIS) 5 MG TABS tablet Take 1 tablet (5 mg total) by mouth 2 (two) times daily.   Cholecalciferol (VITAMIN D-3) 25 MCG (1000 UT) CAPS One by mouth daily.   estradiol (ESTRACE) 0.1 MG/GM vaginal cream Place 1 Applicatorful vaginally. At bedtime every Tuesday, Friday, and Sunday.   famotidine (PEPCID) 20 MG tablet Take 20 mg by mouth at bedtime.   furosemide (LASIX) 40 MG tablet Take 40 mg by mouth daily.  Can give extra 40 mg tablet as needed for weight gain of 3 lbs overnight or 6 lbs in a week.   hydrALAZINE (APRESOLINE) 25 MG tablet Take 1 tablet (25 mg total) by mouth 2 (two) times daily.   hydrocortisone (ANUSOL-HC) 25 MG suppository Place 25 mg rectally every 12 (twelve) hours as needed for hemorrhoids.   losartan (COZAAR) 50 MG tablet Take 50 mg by mouth daily.   metoprolol tartrate (LOPRESSOR) 100 MG tablet Take 100 mg by mouth 2 (two) times daily.   metoprolol tartrate (LOPRESSOR) 50 MG tablet Take 50 mg by mouth daily as needed (Palpitations).   nitroGLYCERIN (NITROSTAT) 0.4 MG SL tablet Place 1 tablet (0.4 mg total) under the  tongue every 5 (five) minutes as needed for chest pain.   ondansetron (ZOFRAN) 4 MG tablet Take 4 mg by mouth every 8 (eight) hours as needed for nausea or vomiting.   polyethylene glycol (MIRALAX / GLYCOLAX) 17 g packet Take 17 g by mouth daily as needed.   rosuvastatin (CRESTOR) 10 MG tablet Take 1 tablet (10 mg total) by mouth daily.   sertraline (ZOLOFT) 100 MG tablet Take 100 mg by mouth daily.   traMADol (ULTRAM) 50 MG tablet Take 1 tablet (50 mg total) by mouth every 6 (six) hours as needed. Give 2 tablets by mouth every 6 hours as needed for severe pain.   vancomycin (VANCOCIN) 125 MG capsule Take 125 mg by mouth. Every other day (Patient not taking: Reported on 03/04/2024)   Zinc Oxide (TRIPLE PASTE) 12.8 % ointment Apply 1 Application topically. To peri area and skin folds topically twice daily   No facility-administered encounter medications on file as of 03/04/2024.    Review of Systems  Unable to perform ROS: Dementia    Immunization History  Administered Date(s) Administered   Influenza, High Dose Seasonal PF 11/23/2022   Influenza-Unspecified 10/10/2023   Moderna Covid-19 Vaccine Bivalent Booster 69yrs & up 12/19/2023   PFIZER(Purple Top)SARS-COV-2 Vaccination 01/07/2020, 01/27/2020, 11/18/2020   PNEUMOCOCCAL CONJUGATE-20 12/14/2023   Pertinent  Health Maintenance Due  Topic Date Due   INFLUENZA VACCINE  Completed   DEXA SCAN  Discontinued      01/10/2024    1:26 PM  Fall Risk  Falls in the past year? 0  Was there an injury with Fall? 0  Fall Risk Category Calculator 0   Functional Status Survey:    Vitals:   03/04/24 1333  BP: 136/66  Pulse: 96  SpO2: 92%  Weight: 172 lb (78 kg)  Height: 5\' 6"  (1.676 m)   Body mass index is 27.76 kg/m. Physical Exam Constitutional:      General: She is not in acute distress.    Appearance: She is well-developed. She is not diaphoretic.  HENT:     Head: Normocephalic and atraumatic.     Mouth/Throat:     Pharynx:  No oropharyngeal exudate.  Eyes:     Conjunctiva/sclera: Conjunctivae normal.     Pupils: Pupils are equal, round, and reactive to light.  Cardiovascular:     Rate and Rhythm: Normal rate. Rhythm irregular.     Heart sounds: Normal heart sounds.  Pulmonary:     Effort: Pulmonary effort is normal.     Breath sounds: Normal breath sounds.  Musculoskeletal:     Cervical back: Normal range of motion and neck supple.     Right lower leg: No edema.     Left lower leg: No edema.  Skin:    General:  Skin is warm and dry.  Neurological:     Mental Status: She is alert.  Psychiatric:        Mood and Affect: Mood normal.     Labs reviewed: Recent Labs    12/11/23 1601 12/12/23 0604 12/12/23 0605 12/13/23 0943 12/20/23 0000 12/27/23 0000 01/10/24 0000 01/21/24 0000  NA 136  --  137 136   < > 139 134* 137  K 3.2*  --  3.2* 3.6   < > 3.4* 3.8 3.4*  CL 98  --  99 101   < > 101 101 100  CO2 26  --  27 26   < > 29* 30* 31*  GLUCOSE 139*  --  116* 145*  --   --   --   --   BUN 14  --  15 12   < > 21 20 19   CREATININE 0.73  --  0.91 0.69   < > 0.7 0.6 0.7  CALCIUM 9.1  --  8.8* 8.9   < > 8.2* 8.3* 8.5*  MG  --  2.0  --   --   --   --   --   --    < > = values in this interval not displayed.   Recent Labs    03/13/23 0156 03/14/23 0217 03/29/23 0000 12/11/23 1601 12/20/23 0000  AST 29 20 15 22 15   ALT 13 10 6* 10 8  ALKPHOS 40 40 155* 74 110  BILITOT 0.4 0.8  --  1.0  --   PROT 5.8* 5.7*  --  7.1  --   ALBUMIN 2.8* 2.7* 2.7* 3.4* 3.1*   Recent Labs    03/15/23 0242 03/15/23 0926 04/30/23 0000 07/05/23 0000 12/11/23 1601 12/12/23 0605 12/20/23 0000 01/21/24 0000  WBC 13.9*   < > 8.3   < > 9.4 10.6* 12.8 10.0  NEUTROABS  --    < > 4,930.00  --  6.7  --  9,562.00  --   HGB 8.8*   < > 12.8   < > 14.2 13.7 15.2 13.0  HCT 26.0*   < > 40   < > 43.2 40.3 46 40  MCV 93.2  --   --   --  92.9 89.6  --   --   PLT 250   < > 413*   < > 347 342 426* 303   < > = values in this  interval not displayed.   Lab Results  Component Value Date   TSH 4.35 08/27/2023   Lab Results  Component Value Date   HGBA1C 6.5 (H) 12/12/2023   Lab Results  Component Value Date   CHOL 185 12/12/2023   HDL 32 (L) 12/12/2023   LDLCALC 130 (H) 12/12/2023   TRIG 117 12/12/2023   CHOLHDL 5.8 12/12/2023    Significant Diagnostic Results in last 30 days:  No results found.  Assessment/Plan 1. Left arm swelling (Primary) New onset left arm swelling, no tenderness on exam or injury She does have hx of left sided hemiparesis and tries to keep arm propped upright -to continue to elevate Will follow up CMP- at risk for nutrition deficiency due to recent cdiff infection putting her at increase risk of edema.  Will also get [place] of upper extremity to rule out DVT- however she is already on eliquis due to a fib.  Staff will continue to monitor and notify of changes.   Janene Harvey. [facility]ag[facility] [phone]

## 2024-03-05 ENCOUNTER — Non-Acute Institutional Stay (SKILLED_NURSING_FACILITY): Payer: Self-pay | Admitting: Student

## 2024-03-05 ENCOUNTER — Encounter: Payer: Self-pay | Admitting: Student

## 2024-03-05 DIAGNOSIS — M6281 Muscle weakness (generalized): Secondary | ICD-10-CM | POA: Diagnosis not present

## 2024-03-05 DIAGNOSIS — R278 Other lack of coordination: Secondary | ICD-10-CM | POA: Diagnosis not present

## 2024-03-05 DIAGNOSIS — I69354 Hemiplegia and hemiparesis following cerebral infarction affecting left non-dominant side: Secondary | ICD-10-CM

## 2024-03-05 DIAGNOSIS — I4821 Permanent atrial fibrillation: Secondary | ICD-10-CM | POA: Diagnosis not present

## 2024-03-05 DIAGNOSIS — I69398 Other sequelae of cerebral infarction: Secondary | ICD-10-CM | POA: Diagnosis not present

## 2024-03-05 DIAGNOSIS — M150 Primary generalized (osteo)arthritis: Secondary | ICD-10-CM | POA: Diagnosis not present

## 2024-03-05 DIAGNOSIS — A0472 Enterocolitis due to Clostridium difficile, not specified as recurrent: Secondary | ICD-10-CM | POA: Diagnosis not present

## 2024-03-05 DIAGNOSIS — I2510 Atherosclerotic heart disease of native coronary artery without angina pectoris: Secondary | ICD-10-CM | POA: Diagnosis not present

## 2024-03-05 DIAGNOSIS — I5032 Chronic diastolic (congestive) heart failure: Secondary | ICD-10-CM | POA: Diagnosis not present

## 2024-03-05 DIAGNOSIS — I10 Essential (primary) hypertension: Secondary | ICD-10-CM

## 2024-03-05 DIAGNOSIS — I130 Hypertensive heart and chronic kidney disease with heart failure and stage 1 through stage 4 chronic kidney disease, or unspecified chronic kidney disease: Secondary | ICD-10-CM | POA: Diagnosis not present

## 2024-03-05 NOTE — Progress Notes (Unsigned)
 Location:  Other [facility].  Nursing Home Room Number: [facility] [address] Place of Service:  SNF [phone]) Provider:  [name], MD  Patient Care Team: [name], MD as PCP - General (Family Medicine) [name], MD (Inactive) as PCP - Cardiology (Cardiology) [name], MD as PCP - Electrophysiology (Cardiology)  Extended Emergency Contact Information Primary Emergency Contact: Sofie Rower [place] Home Phone: [phone] Relation: Daughter Secondary Emergency Contact: Kruer,Danny Mobile Phone: [phone] Relation: Son  Code Status:  DNR Goals of care: Advanced Directive information    03/05/2024   11:10 AM  Advanced Directives  Does Patient Have a Medical Advance Directive? Yes  Type of Advance Directive Out of facility DNR (pink MOST or yellow form)  Does patient want to make changes to medical advance directive? No - Patient declined     Chief Complaint  Patient presents with   Medical Management of Chronic Issues    Medical Management of Chronic Issues.     HPI:  Pt is a 89 y.o. female seen today for medical management of chronic diseases.   History of Present Illness The patient is a 89 year old female with a history of stroke who presents for a routine visit.  She has a history of stroke that resulted in left-sided weakness. She continues to experience severe pain in her left arm, especially during transfers, and reports significant swelling and inability to move her hand. She describes the pain as severe, stating 'it hurts so bad if anybody touches it.'  She has a history of recurrent Clostridioides difficile infection. Her diarrhea has improved with the newer medication, indicating a positive response to treatment.  Over the past year, she has experienced decreased independence, noting significant changes following hip surgery nearly a year ago. She mentions a swollen left leg and difficulty walking due to a 'bad  foot.' She feels helpless and wants to regain some mobility to manage small tasks around her home.  Socially, she discusses her limited engagement in activities at her current facility, expressing a lack of interest in some of the activities offered. She enjoys good sleep and maintains a good appetite, eating sausage biscuits every morning.     Past Medical History:  Diagnosis Date   Back pain    Bruises easily    Coronary artery disease    NONOBSTRUCTIVE   Fatigue    Hyperlipidemia    Hypertension    Knee pain    OA (osteoarthritis)    Obesity    Palpitations    SOB (shortness of breath)    Past Surgical History:  Procedure Laterality Date   BREAST BIOPSY     LEFT BREAST   CARDIAC CATHETERIZATION  05/11/2006   OVERALL CARDIAC SIZE AND SILHOUETTE ARE NORMAL. EF ESTIMATED 60%   INTRAMEDULLARY (IM) NAIL INTERTROCHANTERIC Left 03/12/2023   Procedure: INTRAMEDULLARY (IM) NAIL INTERTROCHANTERIC;  Surgeon: Roby Lofts, MD;  Location: [facility] OR;  Service: Orthopedics;  Laterality: Left;   KIDNEY STONES  1982   KNEE ARTHROSCOPY  09/22/2008   VAGINAL HYSTERECTOMY      Allergies  Allergen Reactions   Codeine Other (See Comments)    Unknown reaction   Demerol [Meperidine Hcl] Other (See Comments)    Unknown reaction   Morphine And Codeine Other (See Comments)    Unknown reaction    Outpatient Encounter Medications as of 03/05/2024  Medication Sig   acetaminophen (TYLENOL) 325 MG tablet Take 650 mg by mouth every 6 (six)  hours as needed.   ALPRAZolam (XANAX) 0.5 MG tablet Take 1 tablet (0.5 mg total) by mouth daily as needed for anxiety. Give one tablet by mouth every 24 hours as needed at bedtime.   ALPRAZolam (XANAX) 1 MG tablet Take 1 tablet (1 mg total) by mouth at bedtime.   apixaban (ELIQUIS) 5 MG TABS tablet Take 1 tablet (5 mg total) by mouth 2 (two) times daily.   BANANA FLAKES PO Take 1 packet by mouth daily. As needed for loose stools   Cholecalciferol (VITAMIN D-3) 25  MCG (1000 UT) CAPS One by mouth daily.   estradiol (ESTRACE) 0.1 MG/GM vaginal cream Place 1 Applicatorful vaginally. At bedtime every Tuesday, Friday, and Sunday.   famotidine (PEPCID) 20 MG tablet Take 20 mg by mouth at bedtime.   furosemide (LASIX) 40 MG tablet Take 40 mg by mouth daily. Can give extra 40 mg tablet as needed for weight gain of 3 lbs overnight or 6 lbs in a week.   hydrALAZINE (APRESOLINE) 25 MG tablet Take 1 tablet (25 mg total) by mouth 2 (two) times daily.   hydrocortisone (ANUSOL-HC) 25 MG suppository Place 25 mg rectally every 12 (twelve) hours as needed for hemorrhoids.   losartan (COZAAR) 50 MG tablet Take 50 mg by mouth daily.   metoprolol tartrate (LOPRESSOR) 100 MG tablet Take 100 mg by mouth 2 (two) times daily.   metoprolol tartrate (LOPRESSOR) 50 MG tablet Take 50 mg by mouth daily as needed (Palpitations).   nitroGLYCERIN (NITROSTAT) 0.4 MG SL tablet Place 1 tablet (0.4 mg total) under the tongue every 5 (five) minutes as needed for chest pain.   ondansetron (ZOFRAN) 4 MG tablet Take 4 mg by mouth every 8 (eight) hours as needed for nausea or vomiting.   polyethylene glycol (MIRALAX / GLYCOLAX) 17 g packet Take 17 g by mouth daily as needed.   rosuvastatin (CRESTOR) 10 MG tablet Take 1 tablet (10 mg total) by mouth daily.   sertraline (ZOLOFT) 100 MG tablet Take 100 mg by mouth daily.   traMADol (ULTRAM) 50 MG tablet Take 1 tablet (50 mg total) by mouth every 6 (six) hours as needed. Give 2 tablets by mouth every 6 hours as needed for severe pain.   Zinc Oxide (TRIPLE PASTE) 12.8 % ointment Apply 1 Application topically. To peri area and skin folds topically twice daily   vancomycin (VANCOCIN) 125 MG capsule Take 125 mg by mouth. Every other day (Patient not taking: Reported on 03/05/2024)   No facility-administered encounter medications on file as of 03/05/2024.    Review of Systems  Immunization History  Administered Date(s) Administered   Influenza, High  Dose Seasonal PF 11/23/2022   Influenza-Unspecified 10/10/2023   Moderna Covid-19 Vaccine Bivalent Booster 61yrs & up 12/19/2023   PFIZER(Purple Top)SARS-COV-2 Vaccination 01/07/2020, 01/27/2020, 11/18/2020   PNEUMOCOCCAL CONJUGATE-20 12/14/2023   Pertinent  Health Maintenance Due  Topic Date Due   INFLUENZA VACCINE  Completed   DEXA SCAN  Discontinued      01/10/2024    1:26 PM  Fall Risk  Falls in the past year? 0  Was there an injury with Fall? 0  Fall Risk Category Calculator 0   Functional Status Survey:    Vitals:   03/05/24 1058  BP: 136/66  Pulse: 96  Resp: 20  Temp: 97.6 F (36.4 C)  SpO2: 92%  Weight: 173 lb 3.2 oz (78.6 kg)  Height: 5\' 6"  (1.676 m)   Body mass index is 27.96 kg/m. Physical  Exam Constitutional:      Appearance: Normal appearance.  Cardiovascular:     Rate and Rhythm: Normal rate.     Pulses: Normal pulses.  Pulmonary:     Effort: Pulmonary effort is normal.  Abdominal:     General: Abdomen is flat. Bowel sounds are normal.     Palpations: Abdomen is soft.  Musculoskeletal:     Comments:  Left hand swollen with limited movement. Right Knee with effusion.  Neurological:     Mental Status: She is alert. Mental status is at baseline.     Labs reviewed: Recent Labs    12/11/23 1601 12/12/23 0604 12/12/23 0605 12/13/23 0943 12/20/23 0000 12/27/23 0000 01/10/24 0000 01/21/24 0000  NA 136  --  137 136   < > 139 134* 137  K 3.2*  --  3.2* 3.6   < > 3.4* 3.8 3.4*  CL 98  --  99 101   < > 101 101 100  CO2 26  --  27 26   < > 29* 30* 31*  GLUCOSE 139*  --  116* 145*  --   --   --   --   BUN 14  --  15 12   < > 21 20 19   CREATININE 0.73  --  0.91 0.69   < > 0.7 0.6 0.7  CALCIUM 9.1  --  8.8* 8.9   < > 8.2* 8.3* 8.5*  MG  --  2.0  --   --   --   --   --   --    < > = values in this interval not displayed.   Recent Labs    03/13/23 0156 03/14/23 0217 03/29/23 0000 12/11/23 1601 12/20/23 0000  AST 29 20 15 22 15   ALT 13 10  6* 10 8  ALKPHOS 40 40 155* 74 110  BILITOT 0.4 0.8  --  1.0  --   PROT 5.8* 5.7*  --  7.1  --   ALBUMIN 2.8* 2.7* 2.7* 3.4* 3.1*   Recent Labs    03/15/23 0242 03/15/23 0926 04/30/23 0000 07/05/23 0000 12/11/23 1601 12/12/23 0605 12/20/23 0000 01/21/24 0000  WBC 13.9*   < > 8.3   < > 9.4 10.6* 12.8 10.0  NEUTROABS  --    < > 4,930.00  --  6.7  --  9,562.00  --   HGB 8.8*   < > 12.8   < > 14.2 13.7 15.2 13.0  HCT 26.0*   < > 40   < > 43.2 40.3 46 40  MCV 93.2  --   --   --  92.9 89.6  --   --   PLT 250   < > 413*   < > 347 342 426* 303   < > = values in this interval not displayed.   Lab Results  Component Value Date   TSH 4.35 08/27/2023   Lab Results  Component Value Date   HGBA1C 6.5 (H) 12/12/2023   Lab Results  Component Value Date   CHOL 185 12/12/2023   HDL 32 (L) 12/12/2023   LDLCALC 130 (H) 12/12/2023   TRIG 117 12/12/2023   CHOLHDL 5.8 12/12/2023    Significant Diagnostic Results in last 30 days:  No results found.  Assessment/Plan Stroke with left-sided weakness   Experienced a stroke resulting in left-sided weakness. The left arm is painful during transfers and exhibits swelling, limiting mobility. Concerned about long-term recovery and regaining independence. Discussed  potential use of a splint or sling to aid in arm positioning and reduce pain during transfers. Elevation of the arm may help reduce swelling.   - Discuss with occupational therapist about potential splint or sling for arm positioning   - Consider elevation of the arm to reduce swelling    Hypertension BP well-controlled.  Post-hip surgery status   Underwent hip surgery approximately a year ago following a fall and hip fracture. The left leg remains swollen, impacting mobility.    Knee effusion due to arthritis   Has a knee effusion with fluid accumulation, likely due to arthritis, contributing to mobility issues.    Recurrent Clostridioides difficile infection   Diagnosed with  recurrent C. diff infection. Diarrhea has improved with the current medication, indicating effective treatment.    Goals of Care   Expressed uncertainty about returning to the hospital if health deteriorates. Values independence and is concerned about being helpless. Has not discussed these preferences with family but is open to discussing it with them. Prefers not to return to the hospital unless necessary.   - Continue discussions with family  Family/ staff Communication: nursing, will contact family for GOC conversations

## 2024-03-06 ENCOUNTER — Encounter: Payer: Self-pay | Admitting: Student

## 2024-03-06 DIAGNOSIS — I10 Essential (primary) hypertension: Secondary | ICD-10-CM | POA: Diagnosis not present

## 2024-03-06 DIAGNOSIS — S50811A Abrasion of right forearm, initial encounter: Secondary | ICD-10-CM | POA: Diagnosis not present

## 2024-03-06 LAB — HEPATIC FUNCTION PANEL
ALT: 20 U/L (ref 7–35)
AST: 31 (ref 13–35)
Alkaline Phosphatase: 81 (ref 25–125)
Bilirubin, Total: 0.5

## 2024-03-06 LAB — BASIC METABOLIC PANEL WITH GFR
BUN: 21 (ref 4–21)
CO2: 30 — AB (ref 13–22)
Chloride: 103 (ref 99–108)
Creatinine: 0.7 (ref 0.5–1.1)
Glucose: 86
Potassium: 3.5 meq/L (ref 3.5–5.1)
Sodium: 141 (ref 137–147)

## 2024-03-06 LAB — COMPREHENSIVE METABOLIC PANEL WITH GFR
Albumin: 2.6 — AB (ref 3.5–5.0)
Globulin: 2.5

## 2024-03-07 DIAGNOSIS — I69398 Other sequelae of cerebral infarction: Secondary | ICD-10-CM | POA: Diagnosis not present

## 2024-03-07 DIAGNOSIS — I130 Hypertensive heart and chronic kidney disease with heart failure and stage 1 through stage 4 chronic kidney disease, or unspecified chronic kidney disease: Secondary | ICD-10-CM | POA: Diagnosis not present

## 2024-03-07 DIAGNOSIS — M6281 Muscle weakness (generalized): Secondary | ICD-10-CM | POA: Diagnosis not present

## 2024-03-07 DIAGNOSIS — I2510 Atherosclerotic heart disease of native coronary artery without angina pectoris: Secondary | ICD-10-CM | POA: Diagnosis not present

## 2024-03-07 DIAGNOSIS — R278 Other lack of coordination: Secondary | ICD-10-CM | POA: Diagnosis not present

## 2024-03-11 DIAGNOSIS — N390 Urinary tract infection, site not specified: Secondary | ICD-10-CM | POA: Diagnosis not present

## 2024-03-12 DIAGNOSIS — I69315 Cognitive social or emotional deficit following cerebral infarction: Secondary | ICD-10-CM | POA: Diagnosis not present

## 2024-03-12 DIAGNOSIS — F5105 Insomnia due to other mental disorder: Secondary | ICD-10-CM | POA: Diagnosis not present

## 2024-03-12 DIAGNOSIS — F419 Anxiety disorder, unspecified: Secondary | ICD-10-CM | POA: Diagnosis not present

## 2024-03-12 DIAGNOSIS — F331 Major depressive disorder, recurrent, moderate: Secondary | ICD-10-CM | POA: Diagnosis not present

## 2024-03-13 DIAGNOSIS — N390 Urinary tract infection, site not specified: Secondary | ICD-10-CM | POA: Diagnosis not present

## 2024-03-13 DIAGNOSIS — I2510 Atherosclerotic heart disease of native coronary artery without angina pectoris: Secondary | ICD-10-CM | POA: Diagnosis not present

## 2024-03-13 DIAGNOSIS — M6281 Muscle weakness (generalized): Secondary | ICD-10-CM | POA: Diagnosis not present

## 2024-03-13 DIAGNOSIS — I130 Hypertensive heart and chronic kidney disease with heart failure and stage 1 through stage 4 chronic kidney disease, or unspecified chronic kidney disease: Secondary | ICD-10-CM | POA: Diagnosis not present

## 2024-03-13 DIAGNOSIS — I69398 Other sequelae of cerebral infarction: Secondary | ICD-10-CM | POA: Diagnosis not present

## 2024-03-13 DIAGNOSIS — R278 Other lack of coordination: Secondary | ICD-10-CM | POA: Diagnosis not present

## 2024-03-13 LAB — CBC AND DIFFERENTIAL
HCT: 39 (ref 36–46)
Hemoglobin: 12.8 (ref 12.0–16.0)
Platelets: 342 10*3/uL (ref 150–400)
WBC: 9.1

## 2024-03-13 LAB — CBC: RBC: 4.1 (ref 3.87–5.11)

## 2024-03-20 DIAGNOSIS — I10 Essential (primary) hypertension: Secondary | ICD-10-CM | POA: Diagnosis not present

## 2024-03-20 LAB — BASIC METABOLIC PANEL WITH GFR
BUN: 20 (ref 4–21)
CO2: 32 — AB (ref 13–22)
Chloride: 100 (ref 99–108)
Creatinine: 0.6 (ref 0.5–1.1)
Glucose: 102
Potassium: 3.4 meq/L — AB (ref 3.5–5.1)
Sodium: 140 (ref 137–147)

## 2024-03-20 LAB — COMPREHENSIVE METABOLIC PANEL WITH GFR: Calcium: 8.6 — AB (ref 8.7–10.7)

## 2024-03-21 DIAGNOSIS — G8192 Hemiplegia, unspecified affecting left dominant side: Secondary | ICD-10-CM | POA: Diagnosis not present

## 2024-03-21 DIAGNOSIS — M6281 Muscle weakness (generalized): Secondary | ICD-10-CM | POA: Diagnosis not present

## 2024-03-21 DIAGNOSIS — I10 Essential (primary) hypertension: Secondary | ICD-10-CM | POA: Diagnosis not present

## 2024-03-21 DIAGNOSIS — I5032 Chronic diastolic (congestive) heart failure: Secondary | ICD-10-CM | POA: Diagnosis not present

## 2024-03-21 DIAGNOSIS — R278 Other lack of coordination: Secondary | ICD-10-CM | POA: Diagnosis not present

## 2024-03-21 DIAGNOSIS — Z9181 History of falling: Secondary | ICD-10-CM | POA: Diagnosis not present

## 2024-03-21 DIAGNOSIS — N1831 Chronic kidney disease, stage 3a: Secondary | ICD-10-CM | POA: Diagnosis not present

## 2024-03-21 DIAGNOSIS — I4821 Permanent atrial fibrillation: Secondary | ICD-10-CM | POA: Diagnosis not present

## 2024-03-21 DIAGNOSIS — I2510 Atherosclerotic heart disease of native coronary artery without angina pectoris: Secondary | ICD-10-CM | POA: Diagnosis not present

## 2024-03-21 DIAGNOSIS — R2689 Other abnormalities of gait and mobility: Secondary | ICD-10-CM | POA: Diagnosis not present

## 2024-03-21 DIAGNOSIS — E1122 Type 2 diabetes mellitus with diabetic chronic kidney disease: Secondary | ICD-10-CM | POA: Diagnosis not present

## 2024-03-21 DIAGNOSIS — I130 Hypertensive heart and chronic kidney disease with heart failure and stage 1 through stage 4 chronic kidney disease, or unspecified chronic kidney disease: Secondary | ICD-10-CM | POA: Diagnosis not present

## 2024-03-21 DIAGNOSIS — I69398 Other sequelae of cerebral infarction: Secondary | ICD-10-CM | POA: Diagnosis not present

## 2024-03-21 DIAGNOSIS — M150 Primary generalized (osteo)arthritis: Secondary | ICD-10-CM | POA: Diagnosis not present

## 2024-03-21 DIAGNOSIS — I872 Venous insufficiency (chronic) (peripheral): Secondary | ICD-10-CM | POA: Diagnosis not present

## 2024-03-24 DIAGNOSIS — E1159 Type 2 diabetes mellitus with other circulatory complications: Secondary | ICD-10-CM | POA: Diagnosis not present

## 2024-03-24 DIAGNOSIS — B351 Tinea unguium: Secondary | ICD-10-CM | POA: Diagnosis not present

## 2024-03-27 ENCOUNTER — Other Ambulatory Visit: Payer: Self-pay | Admitting: Nurse Practitioner

## 2024-03-27 DIAGNOSIS — F1320 Sedative, hypnotic or anxiolytic dependence, uncomplicated: Secondary | ICD-10-CM

## 2024-03-27 MED ORDER — ALPRAZOLAM 1 MG PO TABS: 1.0000 mg | ORAL_TABLET | Freq: Every day | ORAL | 0 refills | Status: DC

## 2024-04-03 ENCOUNTER — Encounter: Payer: Self-pay | Admitting: Nurse Practitioner

## 2024-04-03 ENCOUNTER — Non-Acute Institutional Stay (SKILLED_NURSING_FACILITY): Admitting: Nurse Practitioner

## 2024-04-03 DIAGNOSIS — I872 Venous insufficiency (chronic) (peripheral): Secondary | ICD-10-CM

## 2024-04-03 DIAGNOSIS — F331 Major depressive disorder, recurrent, moderate: Secondary | ICD-10-CM | POA: Diagnosis not present

## 2024-04-03 DIAGNOSIS — I69315 Cognitive social or emotional deficit following cerebral infarction: Secondary | ICD-10-CM | POA: Diagnosis not present

## 2024-04-03 DIAGNOSIS — I4821 Permanent atrial fibrillation: Secondary | ICD-10-CM

## 2024-04-03 DIAGNOSIS — I10 Essential (primary) hypertension: Secondary | ICD-10-CM | POA: Diagnosis not present

## 2024-04-03 DIAGNOSIS — F4322 Adjustment disorder with anxiety: Secondary | ICD-10-CM | POA: Diagnosis not present

## 2024-04-03 DIAGNOSIS — R739 Hyperglycemia, unspecified: Secondary | ICD-10-CM

## 2024-04-03 DIAGNOSIS — F5105 Insomnia due to other mental disorder: Secondary | ICD-10-CM | POA: Diagnosis not present

## 2024-04-03 DIAGNOSIS — N1831 Chronic kidney disease, stage 3a: Secondary | ICD-10-CM

## 2024-04-03 DIAGNOSIS — I69354 Hemiplegia and hemiparesis following cerebral infarction affecting left non-dominant side: Secondary | ICD-10-CM

## 2024-04-03 DIAGNOSIS — M150 Primary generalized (osteo)arthritis: Secondary | ICD-10-CM

## 2024-04-03 DIAGNOSIS — F5101 Primary insomnia: Secondary | ICD-10-CM

## 2024-04-03 DIAGNOSIS — I2510 Atherosclerotic heart disease of native coronary artery without angina pectoris: Secondary | ICD-10-CM

## 2024-04-03 DIAGNOSIS — I5032 Chronic diastolic (congestive) heart failure: Secondary | ICD-10-CM

## 2024-04-03 DIAGNOSIS — I509 Heart failure, unspecified: Secondary | ICD-10-CM | POA: Diagnosis not present

## 2024-04-03 DIAGNOSIS — F1320 Sedative, hypnotic or anxiolytic dependence, uncomplicated: Secondary | ICD-10-CM

## 2024-04-03 DIAGNOSIS — F419 Anxiety disorder, unspecified: Secondary | ICD-10-CM | POA: Diagnosis not present

## 2024-04-03 DIAGNOSIS — E785 Hyperlipidemia, unspecified: Secondary | ICD-10-CM

## 2024-04-03 LAB — BASIC METABOLIC PANEL WITH GFR
BUN: 37 — AB (ref 4–21)
CO2: 31 — AB (ref 13–22)
Chloride: 101 (ref 99–108)
Creatinine: 0.9 (ref 0.5–1.1)
Glucose: 90
Potassium: 3.5 meq/L (ref 3.5–5.1)
Sodium: 140 (ref 137–147)

## 2024-04-03 LAB — COMPREHENSIVE METABOLIC PANEL WITH GFR
Calcium: 8.5 — AB (ref 8.7–10.7)
eGFR: 61

## 2024-04-03 NOTE — Assessment & Plan Note (Signed)
 Chronic and stable Encourage proper hydration Follow metabolic panel Avoid nephrotoxic meds (NSAIDS)

## 2024-04-03 NOTE — Progress Notes (Signed)
 Location:  Other [facility].  Nursing Home Room Number: [facility] [address] Place of Service:  SNF [phone]) [name], NP  PCP: [name], MD  Patient Care Team: [name], MD as PCP - General (Family Medicine) [name], MD (Inactive) as PCP - Cardiology (Cardiology) [name], MD as PCP - Electrophysiology (Cardiology)  Extended Emergency Contact Information Primary Emergency Contact: Sofie Rower [place] Home Phone: [phone] Relation: Daughter Secondary Emergency Contact: Hoehn,Danny Mobile Phone: [phone] Relation: Son  Goals of care: Advanced Directive information    03/05/2024   11:10 AM  Advanced Directives  Does Patient Have a Medical Advance Directive? Yes  Type of Advance Directive Out of facility DNR (pink MOST or yellow form)  Does patient want to make changes to medical advance directive? No - Patient declined     Chief Complaint  Patient presents with   Medical Management of Chronic Issues    Medical Management of Chronic Issues.     HPI:  Pt is a 89 y.o. female seen today for medical management of chronic disease.  Pt with hx of dementia, cdiff, anxiety and stroke.  She has been doing well in the past month without acute issues OT saw her due to increase pain in left arm and has added supportive device to her wheelchair since she is unable to move due to CVA.  Her weight has trended down over the last month and continues on supplement.  Pt is pleasant today without any complaints.  Nursing has no concerns.   Past Medical History:  Diagnosis Date   Back pain    Bruises easily    C. difficile diarrhea 02/14/2024   Coronary artery disease    NONOBSTRUCTIVE   Fatigue    Hyperlipidemia    Hypertension    Knee pain    OA (osteoarthritis)    Obesity    Palpitations    SOB (shortness of breath)    Past Surgical History:  Procedure Laterality Date   BREAST BIOPSY     LEFT BREAST    CARDIAC CATHETERIZATION  05/11/2006   OVERALL CARDIAC SIZE AND SILHOUETTE ARE NORMAL. EF ESTIMATED 60%   INTRAMEDULLARY (IM) NAIL INTERTROCHANTERIC Left 03/12/2023   Procedure: INTRAMEDULLARY (IM) NAIL INTERTROCHANTERIC;  Surgeon: Roby Lofts, MD;  Location: [facility] OR;  Service: Orthopedics;  Laterality: Left;   KIDNEY STONES  1982   KNEE ARTHROSCOPY  09/22/2008   VAGINAL HYSTERECTOMY      Allergies  Allergen Reactions   Codeine Other (See Comments)    Unknown reaction   Demerol [Meperidine Hcl] Other (See Comments)    Unknown reaction   Morphine And Codeine Other (See Comments)    Unknown reaction    Outpatient Encounter Medications as of 04/03/2024  Medication Sig   ALPRAZolam (XANAX) 0.5 MG tablet Take 1 tablet (0.5 mg total) by mouth daily as needed for anxiety. Give one tablet by mouth every 24 hours as needed at bedtime.   ALPRAZolam (XANAX) 1 MG tablet Take 1 tablet (1 mg total) by mouth at bedtime.   apixaban (ELIQUIS) 5 MG TABS tablet Take 1 tablet (5 mg total) by mouth 2 (two) times daily.   Cholecalciferol (VITAMIN D-3) 25 MCG (1000 UT) CAPS One by mouth daily.   estradiol (ESTRACE) 0.1 MG/GM vaginal cream Place 1 Applicatorful vaginally. At bedtime every Mon, Wed, Fri   famotidine (PEPCID) 20 MG tablet Take 20 mg by mouth at bedtime.   furosemide (LASIX) 40 MG tablet  Take 40 mg by mouth 2 (two) times daily. Can give extra 40 mg tablet as needed for weight gain of 3 lbs overnight or 6 lbs in a week.   hydrALAZINE (APRESOLINE) 25 MG tablet Take 1 tablet (25 mg total) by mouth 2 (two) times daily.   hydrocortisone (ANUSOL-HC) 25 MG suppository Place 25 mg rectally every 12 (twelve) hours as needed for hemorrhoids.   losartan (COZAAR) 50 MG tablet Take 50 mg by mouth daily.   metoprolol tartrate (LOPRESSOR) 100 MG tablet Take 100 mg by mouth 2 (two) times daily.   metoprolol tartrate (LOPRESSOR) 50 MG tablet Take 50 mg by mouth daily as needed (Palpitations).   nitroGLYCERIN  (NITROSTAT) 0.4 MG SL tablet Place 1 tablet (0.4 mg total) under the tongue every 5 (five) minutes as needed for chest pain.   rosuvastatin (CRESTOR) 10 MG tablet Take 1 tablet (10 mg total) by mouth daily.   sertraline (ZOLOFT) 100 MG tablet Take 100 mg by mouth daily.   traMADol (ULTRAM) 50 MG tablet Take 1 tablet (50 mg total) by mouth every 6 (six) hours as needed. Give 2 tablets by mouth every 6 hours as needed for severe pain.   Zinc Oxide (TRIPLE PASTE) 12.8 % ointment Apply 1 Application topically. To peri area and skin folds topically twice daily   [DISCONTINUED] acetaminophen (TYLENOL) 325 MG tablet Take 650 mg by mouth every 6 (six) hours as needed. (Patient not taking: Reported on 04/03/2024)   [DISCONTINUED] BANANA FLAKES PO Take 1 packet by mouth daily. As needed for loose stools (Patient not taking: Reported on 04/03/2024)   [DISCONTINUED] ondansetron (ZOFRAN) 4 MG tablet Take 4 mg by mouth every 8 (eight) hours as needed for nausea or vomiting. (Patient not taking: Reported on 04/03/2024)   [DISCONTINUED] polyethylene glycol (MIRALAX / GLYCOLAX) 17 g packet Take 17 g by mouth daily as needed. (Patient not taking: Reported on 04/03/2024)   No facility-administered encounter medications on file as of 04/03/2024.    Review of Systems  Constitutional:  Negative for activity change, appetite change, fatigue and unexpected weight change.  HENT:  Negative for congestion and hearing loss.   Eyes: Negative.   Respiratory:  Negative for cough and shortness of breath.   Cardiovascular:  Negative for chest pain, palpitations and leg swelling.  Gastrointestinal:  Negative for abdominal pain, constipation and diarrhea.  Genitourinary:  Negative for difficulty urinating and dysuria.  Musculoskeletal:  Negative for arthralgias and myalgias.  Skin:  Negative for color change and wound.  Neurological:  Positive for weakness. Negative for dizziness.  Psychiatric/Behavioral:  Positive for confusion.  Negative for agitation and behavioral problems.      Immunization History  Administered Date(s) Administered   Influenza, High Dose Seasonal PF 11/23/2022   Influenza-Unspecified 10/10/2023   Moderna Covid-19 Vaccine Bivalent Booster 38yrs & up 12/19/2023   PFIZER(Purple Top)SARS-COV-2 Vaccination 01/07/2020, 01/27/2020, 11/18/2020   PNEUMOCOCCAL CONJUGATE-20 12/14/2023   Pertinent  Health Maintenance Due  Topic Date Due   INFLUENZA VACCINE  07/18/2024   DEXA SCAN  Discontinued      01/10/2024    1:26 PM  Fall Risk  Falls in the past year? 0  Was there an injury with Fall? 0  Fall Risk Category Calculator 0   Functional Status Survey:    Vitals:   04/03/24 0825  BP: 120/60  Pulse: 62  Resp: 20  Temp: 97.6 F (36.4 C)  SpO2: 92%  Weight: 159 lb (72.1 kg)  Height: 5\' 6"  (  1.676 m)   Body mass index is 25.66 kg/m. Physical Exam Constitutional:      General: She is not in acute distress.    Appearance: She is well-developed. She is not diaphoretic.  HENT:     Head: Normocephalic and atraumatic.     Mouth/Throat:     Pharynx: No oropharyngeal exudate.  Eyes:     Conjunctiva/sclera: Conjunctivae normal.     Pupils: Pupils are equal, round, and reactive to light.  Cardiovascular:     Rate and Rhythm: Normal rate and regular rhythm.     Heart sounds: Normal heart sounds.  Pulmonary:     Effort: Pulmonary effort is normal.     Breath sounds: Normal breath sounds.  Abdominal:     General: Bowel sounds are normal.     Palpations: Abdomen is soft.  Musculoskeletal:     Cervical back: Normal range of motion and neck supple.     Right lower leg: No edema.     Left lower leg: No edema.  Skin:    General: Skin is warm and dry.  Neurological:     Mental Status: She is alert.  Psychiatric:        Mood and Affect: Mood normal.     Labs reviewed: Recent Labs    12/11/23 1601 12/12/23 0604 12/12/23 0605 12/13/23 0943 12/20/23 0000 01/10/24 0000 01/21/24 0000  03/06/24 0000 03/20/24 0000  NA 136  --  137 136   < > 134* 137 141 140  K 3.2*  --  3.2* 3.6   < > 3.8 3.4* 3.5 3.4*  CL 98  --  99 101   < > 101 100 103 100  CO2 26  --  27 26   < > 30* 31* 30* 32*  GLUCOSE 139*  --  116* 145*  --   --   --   --   --   BUN 14  --  15 12   < > 20 19 21 20   CREATININE 0.73  --  0.91 0.69   < > 0.6 0.7 0.7 0.6  CALCIUM 9.1  --  8.8* 8.9   < > 8.3* 8.5*  --  8.6*  MG  --  2.0  --   --   --   --   --   --   --    < > = values in this interval not displayed.   Recent Labs    12/11/23 1601 12/20/23 0000 03/06/24 0000  AST 22 15 31   ALT 10 8 20   ALKPHOS 74 110 81  BILITOT 1.0  --   --   PROT 7.1  --   --   ALBUMIN 3.4* 3.1* 2.6*   Recent Labs    04/30/23 0000 07/05/23 0000 12/11/23 1601 12/12/23 0605 12/20/23 0000 01/21/24 0000 03/13/24 0000  WBC 8.3   < > 9.4 10.6* 12.8 10.0 9.1  NEUTROABS 4,930.00  --  6.7  --  9,562.00  --   --   HGB 12.8   < > 14.2 13.7 15.2 13.0 12.8  HCT 40   < > 43.2 40.3 46 40 39  MCV  --   --  92.9 89.6  --   --   --   PLT 413*   < > 347 342 426* 303 342   < > = values in this interval not displayed.   Lab Results  Component Value Date   TSH 4.35 08/27/2023   Lab  Results  Component Value Date   HGBA1C 6.5 (H) 12/12/2023   Lab Results  Component Value Date   CHOL 185 12/12/2023   HDL 32 (L) 12/12/2023   LDLCALC 130 (H) 12/12/2023   TRIG 117 12/12/2023   CHOLHDL 5.8 12/12/2023    Significant Diagnostic Results in last 30 days:  No results found.  Assessment/Plan OA (osteoarthritis) Stable at this time, continue supportive care.   Hypertension Blood pressure well controlled, goal bp <140/90 Continue current medications and dietary modifications follow metabolic panel.   Permanent atrial fibrillation (HCC) Rate controlled, continues on eliquis for anticoagulation   Stage 3a chronic kidney disease (CKD) (HCC) Chronic and stable Encourage proper hydration Follow metabolic panel Avoid  nephrotoxic meds (NSAIDS)   Chronic heart failure with preserved ejection fraction (HFpEF) (HCC) Euvolemic, continues on lasix twice daily and metoprolol   Insomnia/Anxiety Stable, continues on xanax 1 mg daily  Venous insufficiency of both lower extremities Stable at this time, continues with elevation and lasix   Hemiparesis of left nondominant side as late effect of cerebral infarction (HCC) Unchanged, continue with supportive care. Without progressive symptoms. Continues on eliquis, crestor and antihypertensive regimen  Coronary artery disease Stable, continues on metoprolol and  hydralazine  Hyperglycemia Continue to monitor a1c  Hyperlipidemia Continues on crestor   Benzodiazepine dependence (HCC) Ongoing, unable to tolerate wean.      Desjuan Stearns K. Denney Fisherman [facility] [phone]

## 2024-04-03 NOTE — Assessment & Plan Note (Signed)
Continue to monitor a1c

## 2024-04-03 NOTE — Assessment & Plan Note (Signed)
 Blood pressure well controlled, goal bp <140/90 Continue current medications and dietary modifications follow metabolic panel

## 2024-04-03 NOTE — Assessment & Plan Note (Signed)
 Ongoing, unable to tolerate wean.

## 2024-04-03 NOTE — Assessment & Plan Note (Signed)
 Stable, continues on metoprolol and  hydralazine

## 2024-04-03 NOTE — Assessment & Plan Note (Addendum)
 Unchanged, continue with supportive care. Without progressive symptoms. Continues on eliquis, crestor and antihypertensive regimen

## 2024-04-03 NOTE — Assessment & Plan Note (Signed)
 Stable at this time, continues with elevation and lasix

## 2024-04-03 NOTE — Assessment & Plan Note (Addendum)
 Euvolemic, continues on lasix twice daily and metoprolol

## 2024-04-03 NOTE — Assessment & Plan Note (Signed)
 Stable at this time, continue supportive care.

## 2024-04-03 NOTE — Assessment & Plan Note (Signed)
 Stable, continues on xanax 1 mg daily

## 2024-04-03 NOTE — Assessment & Plan Note (Signed)
 Continues on crestor

## 2024-04-03 NOTE — Assessment & Plan Note (Signed)
 Rate controlled, continues on eliquis for anticoagulation

## 2024-04-09 DIAGNOSIS — F419 Anxiety disorder, unspecified: Secondary | ICD-10-CM | POA: Diagnosis not present

## 2024-04-09 DIAGNOSIS — F331 Major depressive disorder, recurrent, moderate: Secondary | ICD-10-CM | POA: Diagnosis not present

## 2024-04-09 DIAGNOSIS — F4322 Adjustment disorder with anxiety: Secondary | ICD-10-CM | POA: Diagnosis not present

## 2024-04-09 DIAGNOSIS — I69315 Cognitive social or emotional deficit following cerebral infarction: Secondary | ICD-10-CM | POA: Diagnosis not present

## 2024-04-09 DIAGNOSIS — F5105 Insomnia due to other mental disorder: Secondary | ICD-10-CM | POA: Diagnosis not present

## 2024-04-18 DIAGNOSIS — I130 Hypertensive heart and chronic kidney disease with heart failure and stage 1 through stage 4 chronic kidney disease, or unspecified chronic kidney disease: Secondary | ICD-10-CM | POA: Diagnosis not present

## 2024-04-18 LAB — HEPATIC FUNCTION PANEL
ALT: 16 U/L (ref 7–35)
AST: 27 (ref 13–35)
Alkaline Phosphatase: 74 (ref 25–125)

## 2024-04-18 LAB — COMPREHENSIVE METABOLIC PANEL WITH GFR
Albumin: 2.8 — AB (ref 3.5–5.0)
Calcium: 8.7 (ref 8.7–10.7)
Globulin: 2.6
eGFR: 76

## 2024-04-18 LAB — BASIC METABOLIC PANEL WITH GFR
BUN: 22 — AB (ref 4–21)
CO2: 31 — AB (ref 13–22)
Chloride: 100 (ref 99–108)
Creatinine: 0.7 (ref 0.5–1.1)
Glucose: 178
Potassium: 3 meq/L — AB (ref 3.5–5.1)
Sodium: 139 (ref 137–147)

## 2024-04-18 LAB — CBC AND DIFFERENTIAL
HCT: 38 (ref 36–46)
Hemoglobin: 12.8 (ref 12.0–16.0)
Neutrophils Absolute: 6983
Platelets: 310 K/uL (ref 150–400)
WBC: 10.5

## 2024-04-18 LAB — CBC: RBC: 4.12 (ref 3.87–5.11)

## 2024-04-21 DIAGNOSIS — M150 Primary generalized (osteo)arthritis: Secondary | ICD-10-CM | POA: Diagnosis not present

## 2024-04-21 DIAGNOSIS — I4821 Permanent atrial fibrillation: Secondary | ICD-10-CM | POA: Diagnosis not present

## 2024-04-21 DIAGNOSIS — I10 Essential (primary) hypertension: Secondary | ICD-10-CM | POA: Diagnosis not present

## 2024-04-21 DIAGNOSIS — E1122 Type 2 diabetes mellitus with diabetic chronic kidney disease: Secondary | ICD-10-CM | POA: Diagnosis not present

## 2024-04-21 DIAGNOSIS — Z9181 History of falling: Secondary | ICD-10-CM | POA: Diagnosis not present

## 2024-04-21 DIAGNOSIS — I69398 Other sequelae of cerebral infarction: Secondary | ICD-10-CM | POA: Diagnosis not present

## 2024-04-21 DIAGNOSIS — R278 Other lack of coordination: Secondary | ICD-10-CM | POA: Diagnosis not present

## 2024-04-21 DIAGNOSIS — R2689 Other abnormalities of gait and mobility: Secondary | ICD-10-CM | POA: Diagnosis not present

## 2024-04-21 DIAGNOSIS — G8192 Hemiplegia, unspecified affecting left dominant side: Secondary | ICD-10-CM | POA: Diagnosis not present

## 2024-04-21 DIAGNOSIS — N1831 Chronic kidney disease, stage 3a: Secondary | ICD-10-CM | POA: Diagnosis not present

## 2024-04-21 DIAGNOSIS — M6281 Muscle weakness (generalized): Secondary | ICD-10-CM | POA: Diagnosis not present

## 2024-04-21 DIAGNOSIS — I872 Venous insufficiency (chronic) (peripheral): Secondary | ICD-10-CM | POA: Diagnosis not present

## 2024-04-21 DIAGNOSIS — I5032 Chronic diastolic (congestive) heart failure: Secondary | ICD-10-CM | POA: Diagnosis not present

## 2024-04-21 DIAGNOSIS — I130 Hypertensive heart and chronic kidney disease with heart failure and stage 1 through stage 4 chronic kidney disease, or unspecified chronic kidney disease: Secondary | ICD-10-CM | POA: Diagnosis not present

## 2024-04-21 DIAGNOSIS — I2510 Atherosclerotic heart disease of native coronary artery without angina pectoris: Secondary | ICD-10-CM | POA: Diagnosis not present

## 2024-04-23 ENCOUNTER — Other Ambulatory Visit: Payer: Self-pay | Admitting: Student

## 2024-04-23 DIAGNOSIS — F1320 Sedative, hypnotic or anxiolytic dependence, uncomplicated: Secondary | ICD-10-CM

## 2024-04-23 MED ORDER — ALPRAZOLAM 0.5 MG PO TABS: 0.5000 mg | ORAL_TABLET | Freq: Every day | ORAL | 0 refills | Status: DC | PRN

## 2024-04-23 NOTE — Progress Notes (Signed)
 Refill of chronic anxiety medications.

## 2024-04-26 DIAGNOSIS — I130 Hypertensive heart and chronic kidney disease with heart failure and stage 1 through stage 4 chronic kidney disease, or unspecified chronic kidney disease: Secondary | ICD-10-CM | POA: Diagnosis not present

## 2024-04-26 DIAGNOSIS — I872 Venous insufficiency (chronic) (peripheral): Secondary | ICD-10-CM | POA: Diagnosis not present

## 2024-04-26 DIAGNOSIS — N1831 Chronic kidney disease, stage 3a: Secondary | ICD-10-CM | POA: Diagnosis not present

## 2024-04-26 DIAGNOSIS — I2510 Atherosclerotic heart disease of native coronary artery without angina pectoris: Secondary | ICD-10-CM | POA: Diagnosis not present

## 2024-04-26 DIAGNOSIS — I5032 Chronic diastolic (congestive) heart failure: Secondary | ICD-10-CM | POA: Diagnosis not present

## 2024-04-26 DIAGNOSIS — I69398 Other sequelae of cerebral infarction: Secondary | ICD-10-CM | POA: Diagnosis not present

## 2024-05-07 ENCOUNTER — Ambulatory Visit: Payer: Medicare Other | Admitting: Internal Medicine

## 2024-05-07 DIAGNOSIS — F419 Anxiety disorder, unspecified: Secondary | ICD-10-CM | POA: Diagnosis not present

## 2024-05-07 DIAGNOSIS — I69315 Cognitive social or emotional deficit following cerebral infarction: Secondary | ICD-10-CM | POA: Diagnosis not present

## 2024-05-07 DIAGNOSIS — F32A Depression, unspecified: Secondary | ICD-10-CM | POA: Diagnosis not present

## 2024-05-08 DIAGNOSIS — N1831 Chronic kidney disease, stage 3a: Secondary | ICD-10-CM | POA: Diagnosis not present

## 2024-05-08 DIAGNOSIS — E876 Hypokalemia: Secondary | ICD-10-CM | POA: Diagnosis not present

## 2024-05-08 LAB — BASIC METABOLIC PANEL WITH GFR
BUN: 21 (ref 4–21)
CO2: 32 — AB (ref 13–22)
Chloride: 101 (ref 99–108)
Creatinine: 0.7 (ref 0.5–1.1)
Glucose: 142
Potassium: 3.9 meq/L (ref 3.5–5.1)
Sodium: 140 (ref 137–147)

## 2024-05-08 LAB — COMPREHENSIVE METABOLIC PANEL WITH GFR
Calcium: 9 (ref 8.7–10.7)
eGFR: 79

## 2024-05-27 DIAGNOSIS — I69398 Other sequelae of cerebral infarction: Secondary | ICD-10-CM | POA: Diagnosis not present

## 2024-05-27 DIAGNOSIS — M6281 Muscle weakness (generalized): Secondary | ICD-10-CM | POA: Diagnosis not present

## 2024-05-27 DIAGNOSIS — I2510 Atherosclerotic heart disease of native coronary artery without angina pectoris: Secondary | ICD-10-CM | POA: Diagnosis not present

## 2024-05-27 DIAGNOSIS — R278 Other lack of coordination: Secondary | ICD-10-CM | POA: Diagnosis not present

## 2024-05-27 DIAGNOSIS — Z9181 History of falling: Secondary | ICD-10-CM | POA: Diagnosis not present

## 2024-05-27 DIAGNOSIS — I130 Hypertensive heart and chronic kidney disease with heart failure and stage 1 through stage 4 chronic kidney disease, or unspecified chronic kidney disease: Secondary | ICD-10-CM | POA: Diagnosis not present

## 2024-05-29 DIAGNOSIS — M6281 Muscle weakness (generalized): Secondary | ICD-10-CM | POA: Diagnosis not present

## 2024-05-29 DIAGNOSIS — I2510 Atherosclerotic heart disease of native coronary artery without angina pectoris: Secondary | ICD-10-CM | POA: Diagnosis not present

## 2024-05-29 DIAGNOSIS — R278 Other lack of coordination: Secondary | ICD-10-CM | POA: Diagnosis not present

## 2024-05-29 DIAGNOSIS — Z9181 History of falling: Secondary | ICD-10-CM | POA: Diagnosis not present

## 2024-05-29 DIAGNOSIS — I69398 Other sequelae of cerebral infarction: Secondary | ICD-10-CM | POA: Diagnosis not present

## 2024-05-29 DIAGNOSIS — I130 Hypertensive heart and chronic kidney disease with heart failure and stage 1 through stage 4 chronic kidney disease, or unspecified chronic kidney disease: Secondary | ICD-10-CM | POA: Diagnosis not present

## 2024-05-30 DIAGNOSIS — I130 Hypertensive heart and chronic kidney disease with heart failure and stage 1 through stage 4 chronic kidney disease, or unspecified chronic kidney disease: Secondary | ICD-10-CM | POA: Diagnosis not present

## 2024-05-30 DIAGNOSIS — I69398 Other sequelae of cerebral infarction: Secondary | ICD-10-CM | POA: Diagnosis not present

## 2024-05-30 DIAGNOSIS — R278 Other lack of coordination: Secondary | ICD-10-CM | POA: Diagnosis not present

## 2024-05-30 DIAGNOSIS — Z9181 History of falling: Secondary | ICD-10-CM | POA: Diagnosis not present

## 2024-05-30 DIAGNOSIS — M6281 Muscle weakness (generalized): Secondary | ICD-10-CM | POA: Diagnosis not present

## 2024-05-30 DIAGNOSIS — I2510 Atherosclerotic heart disease of native coronary artery without angina pectoris: Secondary | ICD-10-CM | POA: Diagnosis not present

## 2024-06-02 ENCOUNTER — Non-Acute Institutional Stay (SKILLED_NURSING_FACILITY): Payer: Self-pay | Admitting: Student

## 2024-06-02 ENCOUNTER — Encounter: Payer: Self-pay | Admitting: Student

## 2024-06-02 DIAGNOSIS — I2510 Atherosclerotic heart disease of native coronary artery without angina pectoris: Secondary | ICD-10-CM | POA: Diagnosis not present

## 2024-06-02 DIAGNOSIS — I872 Venous insufficiency (chronic) (peripheral): Secondary | ICD-10-CM

## 2024-06-02 DIAGNOSIS — I5032 Chronic diastolic (congestive) heart failure: Secondary | ICD-10-CM

## 2024-06-02 DIAGNOSIS — F5101 Primary insomnia: Secondary | ICD-10-CM

## 2024-06-02 DIAGNOSIS — I69354 Hemiplegia and hemiparesis following cerebral infarction affecting left non-dominant side: Secondary | ICD-10-CM

## 2024-06-02 DIAGNOSIS — F1320 Sedative, hypnotic or anxiolytic dependence, uncomplicated: Secondary | ICD-10-CM

## 2024-06-02 DIAGNOSIS — I4821 Permanent atrial fibrillation: Secondary | ICD-10-CM | POA: Diagnosis not present

## 2024-06-02 DIAGNOSIS — N1831 Chronic kidney disease, stage 3a: Secondary | ICD-10-CM

## 2024-06-02 NOTE — Progress Notes (Signed)
 Provider:  Jann Melody Location:  Other [facility]) Nursing Home Room Number: [place] [address]A Place of Service:  SNF (31)  PCP: Valrie Gehrig, MD Patient Care Team: Valrie Gehrig, MD as PCP - General (Family Medicine) Sonny Dust, MD (Inactive) as PCP - Cardiology (Cardiology) Lei Pump, MD as PCP - Electrophysiology (Cardiology)  Extended Emergency Contact Information Primary Emergency Contact: Gordon,NORMA  [place] Home Phone: [phone] Relation: Daughter Secondary Emergency Contact: Texidor,Danny Mobile Phone: [phone] Relation: Son  Code Status: dnr Goals of Care: Advanced Directive information    03/05/2024   11:10 AM  Advanced Directives  Does Patient Have a Medical Advance Directive? Yes  Type of Advance Directive Out of facility DNR (pink MOST or yellow form)  Does patient want to make changes to medical advance directive? No - Patient declined      Chief Complaint  Patient presents with   Medical Management of Chronic Issues    HPI: Patient is a 89 y.o. female seen today for routine visit,  Discussed the use of AI scribe software for clinical note transcription with the patient, who gave verbal consent to proceed.  History of Present Illness   History of Present Illness The patient, with a history of stroke and hip fracture, presents with decreased mobility and increased depression.  She experiences significant decreased mobility, stating 'I can't do nothing now' and 'it hurts my legs and stuff to pull or put it in front of me.' She used to walk better before her stroke and mentions a hip fracture over a year ago. She has a history of a deep tissue injury on her left foot and reports more swelling on the left side than the right, with additional swelling in her back.  She feels more depressed than before, expressing feelings of tiredness and decreased participation in activities. She prefers to stay in  bed or sit in a chair for a couple of hours before getting tired.  She has difficulty remembering to drink enough fluids, leading to dry mouth. She believes she drinks a lot but acknowledges the need to drink more water.  She is oriented to place but disoriented to time, incorrectly stating the year as 2001 and the month as February, when it is actually June 2025.    Past Medical History:  Diagnosis Date   Back pain    Bruises easily    C. difficile diarrhea 02/14/2024   Coronary artery disease    NONOBSTRUCTIVE   Fatigue    Hyperlipidemia    Hypertension    Knee pain    OA (osteoarthritis)    Obesity    Palpitations    SOB (shortness of breath)    Past Surgical History:  Procedure Laterality Date   BREAST BIOPSY     LEFT BREAST   CARDIAC CATHETERIZATION  05/11/2006   OVERALL CARDIAC SIZE AND SILHOUETTE ARE NORMAL. EF ESTIMATED 60%   INTRAMEDULLARY (IM) NAIL INTERTROCHANTERIC Left 03/12/2023   Procedure: INTRAMEDULLARY (IM) NAIL INTERTROCHANTERIC;  Surgeon: Laneta Pintos, MD;  Location: [facility] OR;  Service: Orthopedics;  Laterality: Left;   KIDNEY STONES  1982   KNEE ARTHROSCOPY  09/22/2008   VAGINAL HYSTERECTOMY      reports that she has never smoked. She has never used smokeless tobacco. She reports that she does not drink alcohol  and does not use drugs. Social History   Socioeconomic History   Marital status: Widowed    Spouse name: Not on file   Number of  children: Not on file   Years of education: Not on file   Highest education level: Not on file  Occupational History   Not on file  Tobacco Use   Smoking status: Never   Smokeless tobacco: Never  Vaping Use   Vaping status: Never Used  Substance and Sexual Activity   Alcohol  use: No   Drug use: No   Sexual activity: Not Currently  Other Topics Concern   Not on file  Social History Narrative   Not on file   Social Drivers of Health   Financial Resource Strain: Not on file  Food Insecurity: No Food  Insecurity (12/12/2023)   Hunger Vital Sign    Worried About Running Out of Food in the Last Year: Never true    Ran Out of Food in the Last Year: Never true  Transportation Needs: No Transportation Needs (12/12/2023)   PRAPARE - Administrator, Civil Service (Medical): No    Lack of Transportation (Non-Medical): No  Physical Activity: Not on file  Stress: Not on file  Social Connections: Not on file  Intimate Partner Violence: Not At Risk (12/12/2023)   Humiliation, Afraid, Rape, and Kick questionnaire    Fear of Current or Ex-Partner: No    Emotionally Abused: No    Physically Abused: No    Sexually Abused: No    Functional Status Survey:    Family History  Problem Relation Age of Onset   Heart disease Mother    Stroke Mother    Heart attack Father     Health Maintenance  Topic Date Due   DTaP/Tdap/Td (1 - Tdap) Never done   Zoster Vaccines- Shingrix (1 of 2) Never done   COVID-19 Vaccine (5 - 2024-25 season) 02/13/2024   INFLUENZA VACCINE  07/18/2024   Medicare Annual Wellness (AWV)  01/09/2025   Pneumococcal Vaccine: 50+ Years  Completed   HPV VACCINES  Aged Out   Meningococcal B Vaccine  Aged Out   DEXA SCAN  Discontinued    Allergies  Allergen Reactions   Codeine Other (See Comments)    Unknown reaction   Demerol [Meperidine Hcl] Other (See Comments)    Unknown reaction   Morphine And Codeine Other (See Comments)    Unknown reaction    Outpatient Encounter Medications as of 06/02/2024  Medication Sig   ALPRAZolam  (XANAX ) 0.5 MG tablet Take 1 tablet (0.5 mg total) by mouth daily as needed for anxiety. Give one tablet by mouth every 24 hours as needed at bedtime.   ALPRAZolam  (XANAX ) 1 MG tablet Take 1 tablet (1 mg total) by mouth at bedtime.   apixaban  (ELIQUIS ) 5 MG TABS tablet Take 1 tablet (5 mg total) by mouth 2 (two) times daily.   Cholecalciferol (VITAMIN D -3) 25 MCG (1000 UT) CAPS One by mouth daily.   estradiol (ESTRACE) 0.1 MG/GM  vaginal cream Place 1 Applicatorful vaginally. At bedtime every Mon, Wed, Fri   famotidine  (PEPCID ) 20 MG tablet Take 20 mg by mouth at bedtime.   furosemide  (LASIX ) 40 MG tablet Take 40 mg by mouth 2 (two) times daily. Can give extra 40 mg tablet as needed for weight gain of 3 lbs overnight or 6 lbs in a week.   hydrALAZINE  (APRESOLINE ) 25 MG tablet Take 1 tablet (25 mg total) by mouth 2 (two) times daily.   hydrocortisone (ANUSOL-HC) 25 MG suppository Place 25 mg rectally every 12 (twelve) hours as needed for hemorrhoids.   losartan  (COZAAR ) 50 MG tablet Take  50 mg by mouth daily.   metoprolol  tartrate (LOPRESSOR ) 100 MG tablet Take 100 mg by mouth 2 (two) times daily.   metoprolol  tartrate (LOPRESSOR ) 50 MG tablet Take 50 mg by mouth daily as needed (Palpitations).   nitroGLYCERIN  (NITROSTAT ) 0.4 MG SL tablet Place 1 tablet (0.4 mg total) under the tongue every 5 (five) minutes as needed for chest pain.   rosuvastatin  (CRESTOR ) 10 MG tablet Take 1 tablet (10 mg total) by mouth daily.   sertraline (ZOLOFT) 100 MG tablet Take 100 mg by mouth daily.   traMADol  (ULTRAM ) 50 MG tablet Take 1 tablet (50 mg total) by mouth every 6 (six) hours as needed. Give 2 tablets by mouth every 6 hours as needed for severe pain.   Zinc Oxide (TRIPLE PASTE) 12.8 % ointment Apply 1 Application topically. To peri area and skin folds topically twice daily   No facility-administered encounter medications on file as of 06/02/2024.    Review of Systems  There were no vitals filed for this visit. There is no height or weight on file to calculate BMI. Physical Exam  Cardiovascular:     Rate and Rhythm: Normal rate.     Pulses: Normal pulses.   Musculoskeletal:     Comments: Left sided weakness   Skin:    Comments: Left heel with DTI present   Neurological:     Mental Status: She is alert. She is disoriented.    Labs reviewed: Basic Metabolic Panel: Recent Labs    12/11/23 1601 12/12/23 0604  12/12/23 0605 12/13/23 0943 12/20/23 0000 01/10/24 0000 01/21/24 0000 03/06/24 0000 03/20/24 0000  NA 136  --  137 136   < > 134* 137 141 140  K 3.2*  --  3.2* 3.6   < > 3.8 3.4* 3.5 3.4*  CL 98  --  99 101   < > 101 100 103 100  CO2 26  --  27 26   < > 30* 31* 30* 32*  GLUCOSE 139*  --  116* 145*  --   --   --   --   --   BUN 14  --  15 12   < > 20 19 21 20   CREATININE 0.73  --  0.91 0.69   < > 0.6 0.7 0.7 0.6  CALCIUM  9.1  --  8.8* 8.9   < > 8.3* 8.5*  --  8.6*  MG  --  2.0  --   --   --   --   --   --   --    < > = values in this interval not displayed.   Liver Function Tests: Recent Labs    12/11/23 1601 12/20/23 0000 03/06/24 0000  AST 22 15 31   ALT 10 8 20   ALKPHOS 74 110 81  BILITOT 1.0  --   --   PROT 7.1  --   --   ALBUMIN 3.4* 3.1* 2.6*   No results for input(s): LIPASE, AMYLASE in the last 8760 hours. No results for input(s): AMMONIA in the last 8760 hours. CBC: Recent Labs    12/11/23 1601 12/12/23 0605 12/20/23 0000 01/21/24 0000 03/13/24 0000  WBC 9.4 10.6* 12.8 10.0 9.1  NEUTROABS 6.7  --  9,562.00  --   --   HGB 14.2 13.7 15.2 13.0 12.8  HCT 43.2 40.3 46 40 39  MCV 92.9 89.6  --   --   --   PLT 347 342 426* 303 342  Cardiac Enzymes: No results for input(s): CKTOTAL, CKMB, CKMBINDEX, TROPONINI in the last 8760 hours. BNP: Invalid input(s): POCBNP Lab Results  Component Value Date   HGBA1C 6.5 (H) 12/12/2023   Lab Results  Component Value Date   TSH 4.35 08/27/2023   No results found for: VITAMINB12 No results found for: FOLATE No results found for: IRON, TIBC, FERRITIN Results    Imaging and Procedures obtained prior to SNF admission: ECHOCARDIOGRAM COMPLETE Result Date: 12/13/2023    ECHOCARDIOGRAM REPORT   Patient Name:   ORVILLE WIDMANN Date of Exam: 12/13/2023 Medical Rec #:  [id number]    Height:       66.0 in Accession #:    [id number]   Weight:       184.1 lb Date of Birth:  [birth year]     BSA:           1.931 m Patient Age:    89 years     BP:           170/80 mmHg Patient Gender: F            HR:           68 bpm. Exam Location:  [facility] Procedure: 2D Echo, Cardiac Doppler and Color Doppler Indications:     Stroke  History:         Patient has prior history of Echocardiogram examinations, most                  recent 03/14/2018. CHF, CAD, Stroke, Arrythmias:Atrial                  Fibrillation, Signs/Symptoms:Fatigue and Shortness of Breath;                  Risk Factors:Hypertension and Dyslipidemia. CKD.  Sonographer:     Clarke Crouch Referring Phys:  [id number] Lanetta Pion Diagnosing Phys: Belva Boyden MD IMPRESSIONS  1. Left ventricular ejection fraction, by estimation, is 50 to 55%. Left ventricular ejection fraction by PLAX is 55 %. The left ventricle has low normal function. The left ventricle has no regional wall motion abnormalities. There is mild left ventricular hypertrophy. Left ventricular diastolic parameters are indeterminate.  2. Right ventricular systolic function is normal. The right ventricular size is mildly enlarged. There is mildly elevated pulmonary artery systolic pressure. The estimated right ventricular systolic pressure is 38.0 mmHg.  3. Right atrial size was moderately dilated.  4. The mitral valve is normal in structure. Mild to moderate mitral valve regurgitation. No evidence of mitral stenosis.  5. Tricuspid valve regurgitation is moderate.  6. The aortic valve is normal in structure. There is mild calcification of the aortic valve. Aortic valve regurgitation is mild. Aortic valve sclerosis/calcification is present, without any evidence of aortic stenosis. Aortic valve mean gradient measures 4.0 mmHg.  7. The inferior vena cava is normal in size with greater than 50% respiratory variability, suggesting right atrial pressure of 3 mmHg. FINDINGS  Left Ventricle: Left ventricular ejection fraction, by estimation, is 50 to 55%. Left ventricular ejection fraction by PLAX is 55 %. The  left ventricle has low normal function. The left ventricle has no regional wall motion abnormalities. The left ventricular internal cavity size was normal in size. There is mild left ventricular hypertrophy. Left ventricular diastolic parameters are indeterminate. Right Ventricle: The right ventricular size is mildly enlarged. No increase in right ventricular wall thickness. Right ventricular systolic function is normal. There is mildly elevated pulmonary artery systolic  pressure. The tricuspid regurgitant velocity is 2.74 m/s, and with an assumed right atrial pressure of 8 mmHg, the estimated right ventricular systolic pressure is 38.0 mmHg. Left Atrium: Left atrial size was normal in size. Right Atrium: Right atrial size was moderately dilated. Pericardium: There is no evidence of pericardial effusion. Mitral Valve: The mitral valve is normal in structure. Mild mitral annular calcification. Mild to moderate mitral valve regurgitation. No evidence of mitral valve stenosis. MV peak gradient, 4.8 mmHg. The mean mitral valve gradient is 1.0 mmHg. Tricuspid Valve: The tricuspid valve is normal in structure. Tricuspid valve regurgitation is moderate . No evidence of tricuspid stenosis. Aortic Valve: The aortic valve is normal in structure. There is mild calcification of the aortic valve. Aortic valve regurgitation is mild. Aortic valve sclerosis/calcification is present, without any evidence of aortic stenosis. Aortic valve mean gradient measures 4.0 mmHg. Aortic valve peak gradient measures 7.6 mmHg. Aortic valve area, by VTI measures 2.05 cm. Pulmonic Valve: The pulmonic valve was normal in structure. Pulmonic valve regurgitation is mild. No evidence of pulmonic stenosis. Aorta: The aortic root is normal in size and structure. Venous: The inferior vena cava is normal in size with greater than 50% respiratory variability, suggesting right atrial pressure of 3 mmHg. IAS/Shunts: No atrial level shunt detected by color  flow Doppler.  LEFT VENTRICLE PLAX 2D LV EF:         Left ventricular ejection fraction by PLAX is 55 %. LVIDd:         4.20 cm LVIDs:         3.00 cm LV PW:         1.20 cm LV IVS:        1.20 cm LVOT diam:     2.00 cm LV SV:         65 LV SV Index:   34 LVOT Area:     3.14 cm  RIGHT VENTRICLE RV Basal diam:  4.30 cm RV Mid diam:    3.90 cm RV S prime:     8.92 cm/s LEFT ATRIUM             Index        RIGHT ATRIUM           Index LA diam:        4.00 cm 2.07 cm/m   RA Area:     28.30 cm LA Vol (A2C):   63.7 ml 33.00 ml/m  RA Volume:   100.00 ml 51.80 ml/m LA Vol (A4C):   58.1 ml 30.10 ml/m LA Biplane Vol: 63.3 ml 32.79 ml/m  AORTIC VALVE                    PULMONIC VALVE AV Area (Vmax):    1.97 cm     PV Vmax:       0.72 m/s AV Area (Vmean):   1.84 cm     PV Peak grad:  2.1 mmHg AV Area (VTI):     2.05 cm AV Vmax:           137.50 cm/s AV Vmean:          92.450 cm/s AV VTI:            0.318 m AV Peak Grad:      7.6 mmHg AV Mean Grad:      4.0 mmHg LVOT Vmax:         86.30 cm/s LVOT Vmean:  54.200 cm/s LVOT VTI:          0.208 m LVOT/AV VTI ratio: 0.65  AORTA Ao Root diam: 3.30 cm Ao Asc diam:  3.60 cm MITRAL VALVE                TRICUSPID VALVE MV Area (PHT): 3.66 cm     TR Peak grad:   30.0 mmHg MV Area VTI:   2.61 cm     TR Vmax:        274.00 cm/s MV Peak grad:  4.8 mmHg MV Mean grad:  1.0 mmHg     SHUNTS MV Vmax:       1.10 m/s     Systemic VTI:  0.21 m MV Vmean:      46.5 cm/s    Systemic Diam: 2.00 cm MV Decel Time: 207 msec MV E velocity: 110.00 cm/s Belva Boyden MD Electronically signed by Belva Boyden MD Signature Date/Time: 12/13/2023/2:31:14 PM    Final     Assessment/Plan Stroke with left-sided weakness 12/11/2023 Stroke resulting in left-sided weakness, leading to limited mobility and increased risk of skin breakdown. Reports difficulty moving and pain in legs when attempting to get up. - Encourage mobility to prevent skin breakdown - Educate on risks of immobility,  including skin breakdown  Pressure ulcer of left foot Pressure ulcer on the left foot, exacerbated by immobility and swelling. Increased risk of further skin breakdown without improved mobility. Swelling more pronounced on the left side. - Encourage mobility to prevent further skin breakdown - Continue GOC conversations given evidence of skin failure.   Hip fracture DOI 03/12/2023 Hip fracture over a year ago, contributing to current mobility issues. Pain and discomfort may limit mobility. Reports soreness in the back and difficulty walking post-fracture. - Encourage mobility despite changes in mobility   CHF Appears euvolemic on exam.   Osteoporosis  Continue vitamin D  supplementation  Benzodiazepine dependence Continue current dose of xanax . Patient has had 2 attempts at dose reduction with severe hallucinations upon dose reduction. GDR contraindicated at this time due to patient's high risk of severe decompensation with dose reduction.   Depression Reports increased depression, decreased participation in activities, and social withdrawal. Depression may contribute to decreased motivation for mobility and activity. Expresses fatigue and lack of desire to engage in activities. - Encourage participation in activities to improve mood - Continue zoloft 100 mg   Cognitive impairment Cognitive impairment with disorientation to time and place, difficulty recalling current year and month. Impacts ability to follow care recommendations. Confused about current year, month, and location. - Encourage fluid intake to prevent dehydration   Family/ staff Communication: nursing  Labs/tests ordered: none

## 2024-06-04 DIAGNOSIS — E1159 Type 2 diabetes mellitus with other circulatory complications: Secondary | ICD-10-CM | POA: Diagnosis not present

## 2024-06-04 DIAGNOSIS — I69398 Other sequelae of cerebral infarction: Secondary | ICD-10-CM | POA: Diagnosis not present

## 2024-06-04 DIAGNOSIS — R278 Other lack of coordination: Secondary | ICD-10-CM | POA: Diagnosis not present

## 2024-06-04 DIAGNOSIS — I130 Hypertensive heart and chronic kidney disease with heart failure and stage 1 through stage 4 chronic kidney disease, or unspecified chronic kidney disease: Secondary | ICD-10-CM | POA: Diagnosis not present

## 2024-06-04 DIAGNOSIS — I2510 Atherosclerotic heart disease of native coronary artery without angina pectoris: Secondary | ICD-10-CM | POA: Diagnosis not present

## 2024-06-04 DIAGNOSIS — M6281 Muscle weakness (generalized): Secondary | ICD-10-CM | POA: Diagnosis not present

## 2024-06-04 DIAGNOSIS — B351 Tinea unguium: Secondary | ICD-10-CM | POA: Diagnosis not present

## 2024-06-04 DIAGNOSIS — Z9181 History of falling: Secondary | ICD-10-CM | POA: Diagnosis not present

## 2024-06-05 DIAGNOSIS — I130 Hypertensive heart and chronic kidney disease with heart failure and stage 1 through stage 4 chronic kidney disease, or unspecified chronic kidney disease: Secondary | ICD-10-CM | POA: Diagnosis not present

## 2024-06-05 DIAGNOSIS — R278 Other lack of coordination: Secondary | ICD-10-CM | POA: Diagnosis not present

## 2024-06-05 DIAGNOSIS — M6281 Muscle weakness (generalized): Secondary | ICD-10-CM | POA: Diagnosis not present

## 2024-06-05 DIAGNOSIS — I2510 Atherosclerotic heart disease of native coronary artery without angina pectoris: Secondary | ICD-10-CM | POA: Diagnosis not present

## 2024-06-05 DIAGNOSIS — I69398 Other sequelae of cerebral infarction: Secondary | ICD-10-CM | POA: Diagnosis not present

## 2024-06-05 DIAGNOSIS — S50811A Abrasion of right forearm, initial encounter: Secondary | ICD-10-CM | POA: Diagnosis not present

## 2024-06-05 DIAGNOSIS — Z9181 History of falling: Secondary | ICD-10-CM | POA: Diagnosis not present

## 2024-06-06 DIAGNOSIS — I69398 Other sequelae of cerebral infarction: Secondary | ICD-10-CM | POA: Diagnosis not present

## 2024-06-06 DIAGNOSIS — I130 Hypertensive heart and chronic kidney disease with heart failure and stage 1 through stage 4 chronic kidney disease, or unspecified chronic kidney disease: Secondary | ICD-10-CM | POA: Diagnosis not present

## 2024-06-06 DIAGNOSIS — Z9181 History of falling: Secondary | ICD-10-CM | POA: Diagnosis not present

## 2024-06-06 DIAGNOSIS — M6281 Muscle weakness (generalized): Secondary | ICD-10-CM | POA: Diagnosis not present

## 2024-06-06 DIAGNOSIS — I2510 Atherosclerotic heart disease of native coronary artery without angina pectoris: Secondary | ICD-10-CM | POA: Diagnosis not present

## 2024-06-06 DIAGNOSIS — R278 Other lack of coordination: Secondary | ICD-10-CM | POA: Diagnosis not present

## 2024-06-10 DIAGNOSIS — I69398 Other sequelae of cerebral infarction: Secondary | ICD-10-CM | POA: Diagnosis not present

## 2024-06-10 DIAGNOSIS — M6281 Muscle weakness (generalized): Secondary | ICD-10-CM | POA: Diagnosis not present

## 2024-06-10 DIAGNOSIS — I2510 Atherosclerotic heart disease of native coronary artery without angina pectoris: Secondary | ICD-10-CM | POA: Diagnosis not present

## 2024-06-10 DIAGNOSIS — Z9181 History of falling: Secondary | ICD-10-CM | POA: Diagnosis not present

## 2024-06-10 DIAGNOSIS — I130 Hypertensive heart and chronic kidney disease with heart failure and stage 1 through stage 4 chronic kidney disease, or unspecified chronic kidney disease: Secondary | ICD-10-CM | POA: Diagnosis not present

## 2024-06-10 DIAGNOSIS — R278 Other lack of coordination: Secondary | ICD-10-CM | POA: Diagnosis not present

## 2024-06-11 DIAGNOSIS — F4323 Adjustment disorder with mixed anxiety and depressed mood: Secondary | ICD-10-CM | POA: Diagnosis not present

## 2024-06-12 DIAGNOSIS — M6281 Muscle weakness (generalized): Secondary | ICD-10-CM | POA: Diagnosis not present

## 2024-06-12 DIAGNOSIS — Z9181 History of falling: Secondary | ICD-10-CM | POA: Diagnosis not present

## 2024-06-12 DIAGNOSIS — I130 Hypertensive heart and chronic kidney disease with heart failure and stage 1 through stage 4 chronic kidney disease, or unspecified chronic kidney disease: Secondary | ICD-10-CM | POA: Diagnosis not present

## 2024-06-12 DIAGNOSIS — R278 Other lack of coordination: Secondary | ICD-10-CM | POA: Diagnosis not present

## 2024-06-12 DIAGNOSIS — I69398 Other sequelae of cerebral infarction: Secondary | ICD-10-CM | POA: Diagnosis not present

## 2024-06-12 DIAGNOSIS — I2510 Atherosclerotic heart disease of native coronary artery without angina pectoris: Secondary | ICD-10-CM | POA: Diagnosis not present

## 2024-06-13 DIAGNOSIS — M6281 Muscle weakness (generalized): Secondary | ICD-10-CM | POA: Diagnosis not present

## 2024-06-13 DIAGNOSIS — I2510 Atherosclerotic heart disease of native coronary artery without angina pectoris: Secondary | ICD-10-CM | POA: Diagnosis not present

## 2024-06-13 DIAGNOSIS — R278 Other lack of coordination: Secondary | ICD-10-CM | POA: Diagnosis not present

## 2024-06-13 DIAGNOSIS — Z9181 History of falling: Secondary | ICD-10-CM | POA: Diagnosis not present

## 2024-06-13 DIAGNOSIS — I69398 Other sequelae of cerebral infarction: Secondary | ICD-10-CM | POA: Diagnosis not present

## 2024-06-13 DIAGNOSIS — I130 Hypertensive heart and chronic kidney disease with heart failure and stage 1 through stage 4 chronic kidney disease, or unspecified chronic kidney disease: Secondary | ICD-10-CM | POA: Diagnosis not present

## 2024-06-17 DIAGNOSIS — M6281 Muscle weakness (generalized): Secondary | ICD-10-CM | POA: Diagnosis not present

## 2024-06-17 DIAGNOSIS — Z9181 History of falling: Secondary | ICD-10-CM | POA: Diagnosis not present

## 2024-06-17 DIAGNOSIS — I130 Hypertensive heart and chronic kidney disease with heart failure and stage 1 through stage 4 chronic kidney disease, or unspecified chronic kidney disease: Secondary | ICD-10-CM | POA: Diagnosis not present

## 2024-06-17 DIAGNOSIS — R278 Other lack of coordination: Secondary | ICD-10-CM | POA: Diagnosis not present

## 2024-06-17 DIAGNOSIS — I2510 Atherosclerotic heart disease of native coronary artery without angina pectoris: Secondary | ICD-10-CM | POA: Diagnosis not present

## 2024-06-17 DIAGNOSIS — I69398 Other sequelae of cerebral infarction: Secondary | ICD-10-CM | POA: Diagnosis not present

## 2024-06-18 DIAGNOSIS — Z9181 History of falling: Secondary | ICD-10-CM | POA: Diagnosis not present

## 2024-06-18 DIAGNOSIS — I2510 Atherosclerotic heart disease of native coronary artery without angina pectoris: Secondary | ICD-10-CM | POA: Diagnosis not present

## 2024-06-18 DIAGNOSIS — M6281 Muscle weakness (generalized): Secondary | ICD-10-CM | POA: Diagnosis not present

## 2024-06-18 DIAGNOSIS — I69398 Other sequelae of cerebral infarction: Secondary | ICD-10-CM | POA: Diagnosis not present

## 2024-06-18 DIAGNOSIS — I130 Hypertensive heart and chronic kidney disease with heart failure and stage 1 through stage 4 chronic kidney disease, or unspecified chronic kidney disease: Secondary | ICD-10-CM | POA: Diagnosis not present

## 2024-06-18 DIAGNOSIS — R278 Other lack of coordination: Secondary | ICD-10-CM | POA: Diagnosis not present

## 2024-06-19 ENCOUNTER — Other Ambulatory Visit: Payer: Self-pay | Admitting: Nurse Practitioner

## 2024-06-19 DIAGNOSIS — I69398 Other sequelae of cerebral infarction: Secondary | ICD-10-CM | POA: Diagnosis not present

## 2024-06-19 DIAGNOSIS — R278 Other lack of coordination: Secondary | ICD-10-CM | POA: Diagnosis not present

## 2024-06-19 DIAGNOSIS — I2510 Atherosclerotic heart disease of native coronary artery without angina pectoris: Secondary | ICD-10-CM | POA: Diagnosis not present

## 2024-06-19 DIAGNOSIS — Z9181 History of falling: Secondary | ICD-10-CM | POA: Diagnosis not present

## 2024-06-19 DIAGNOSIS — M6281 Muscle weakness (generalized): Secondary | ICD-10-CM | POA: Diagnosis not present

## 2024-06-19 DIAGNOSIS — F1320 Sedative, hypnotic or anxiolytic dependence, uncomplicated: Secondary | ICD-10-CM

## 2024-06-19 DIAGNOSIS — I130 Hypertensive heart and chronic kidney disease with heart failure and stage 1 through stage 4 chronic kidney disease, or unspecified chronic kidney disease: Secondary | ICD-10-CM | POA: Diagnosis not present

## 2024-06-19 MED ORDER — TRAMADOL HCL 50 MG PO TABS: 50.0000 mg | ORAL_TABLET | Freq: Four times a day (QID) | ORAL | 0 refills | Status: DC | PRN

## 2024-06-19 NOTE — Telephone Encounter (Signed)
 Received request from Pharmacy.  Pended Rx and sent to [facility] for approval.

## 2024-06-23 DIAGNOSIS — L821 Other seborrheic keratosis: Secondary | ICD-10-CM | POA: Diagnosis not present

## 2024-06-23 DIAGNOSIS — C44629 Squamous cell carcinoma of skin of left upper limb, including shoulder: Secondary | ICD-10-CM | POA: Diagnosis not present

## 2024-06-23 DIAGNOSIS — D492 Neoplasm of unspecified behavior of bone, soft tissue, and skin: Secondary | ICD-10-CM | POA: Diagnosis not present

## 2024-06-23 DIAGNOSIS — L814 Other melanin hyperpigmentation: Secondary | ICD-10-CM | POA: Diagnosis not present

## 2024-06-24 DIAGNOSIS — F4323 Adjustment disorder with mixed anxiety and depressed mood: Secondary | ICD-10-CM | POA: Diagnosis not present

## 2024-06-26 DIAGNOSIS — R278 Other lack of coordination: Secondary | ICD-10-CM | POA: Diagnosis not present

## 2024-06-26 DIAGNOSIS — Z9181 History of falling: Secondary | ICD-10-CM | POA: Diagnosis not present

## 2024-06-26 DIAGNOSIS — M6281 Muscle weakness (generalized): Secondary | ICD-10-CM | POA: Diagnosis not present

## 2024-06-26 DIAGNOSIS — I130 Hypertensive heart and chronic kidney disease with heart failure and stage 1 through stage 4 chronic kidney disease, or unspecified chronic kidney disease: Secondary | ICD-10-CM | POA: Diagnosis not present

## 2024-06-26 DIAGNOSIS — I69398 Other sequelae of cerebral infarction: Secondary | ICD-10-CM | POA: Diagnosis not present

## 2024-06-26 DIAGNOSIS — I2510 Atherosclerotic heart disease of native coronary artery without angina pectoris: Secondary | ICD-10-CM | POA: Diagnosis not present

## 2024-06-30 DIAGNOSIS — I130 Hypertensive heart and chronic kidney disease with heart failure and stage 1 through stage 4 chronic kidney disease, or unspecified chronic kidney disease: Secondary | ICD-10-CM | POA: Diagnosis not present

## 2024-06-30 DIAGNOSIS — I2510 Atherosclerotic heart disease of native coronary artery without angina pectoris: Secondary | ICD-10-CM | POA: Diagnosis not present

## 2024-06-30 DIAGNOSIS — R278 Other lack of coordination: Secondary | ICD-10-CM | POA: Diagnosis not present

## 2024-06-30 DIAGNOSIS — Z9181 History of falling: Secondary | ICD-10-CM | POA: Diagnosis not present

## 2024-06-30 DIAGNOSIS — I69398 Other sequelae of cerebral infarction: Secondary | ICD-10-CM | POA: Diagnosis not present

## 2024-06-30 DIAGNOSIS — M6281 Muscle weakness (generalized): Secondary | ICD-10-CM | POA: Diagnosis not present

## 2024-07-02 ENCOUNTER — Encounter: Payer: Self-pay | Admitting: Student

## 2024-07-02 ENCOUNTER — Non-Acute Institutional Stay (SKILLED_NURSING_FACILITY): Payer: Self-pay | Admitting: Student

## 2024-07-02 DIAGNOSIS — F419 Anxiety disorder, unspecified: Secondary | ICD-10-CM | POA: Diagnosis not present

## 2024-07-02 DIAGNOSIS — I5032 Chronic diastolic (congestive) heart failure: Secondary | ICD-10-CM

## 2024-07-02 DIAGNOSIS — I69354 Hemiplegia and hemiparesis following cerebral infarction affecting left non-dominant side: Secondary | ICD-10-CM

## 2024-07-02 DIAGNOSIS — F0153 Vascular dementia, unspecified severity, with mood disturbance: Secondary | ICD-10-CM | POA: Diagnosis not present

## 2024-07-02 DIAGNOSIS — F329 Major depressive disorder, single episode, unspecified: Secondary | ICD-10-CM

## 2024-07-02 DIAGNOSIS — I2510 Atherosclerotic heart disease of native coronary artery without angina pectoris: Secondary | ICD-10-CM

## 2024-07-02 DIAGNOSIS — F0154 Vascular dementia, unspecified severity, with anxiety: Secondary | ICD-10-CM | POA: Diagnosis not present

## 2024-07-02 DIAGNOSIS — F32A Depression, unspecified: Secondary | ICD-10-CM | POA: Diagnosis not present

## 2024-07-02 DIAGNOSIS — I4821 Permanent atrial fibrillation: Secondary | ICD-10-CM | POA: Diagnosis not present

## 2024-07-02 DIAGNOSIS — I69311 Memory deficit following cerebral infarction: Secondary | ICD-10-CM | POA: Diagnosis not present

## 2024-07-02 NOTE — Progress Notes (Signed)
 Location:  Other [facility].  Nursing Home Room Number: [facility] [address]A Place of Service:  SNF [phone]) Provider:  Abdul Fine, MD  Patient Care Team: Abdul Fine, MD as PCP - General (Family Medicine) Hobart Powell BRAVO, MD (Inactive) as PCP - Cardiology (Cardiology) Inocencio Soyla Lunger, MD as PCP - Electrophysiology (Cardiology)  Extended Emergency Contact Information Primary Emergency Contact: Gordon,NORMA  [place] Home Phone: [phone] Relation: Daughter Secondary Emergency Contact: Slifer,Danny Mobile Phone: [phone] Relation: Son  Code Status:  DNR Goals of care: Advanced Directive information    07/02/2024   10:02 AM  Advanced Directives  Does Patient Have a Medical Advance Directive? Yes  Type of Advance Directive Out of facility DNR (pink MOST or yellow form)  Does patient want to make changes to medical advance directive? No - Patient declined     Chief Complaint  Patient presents with   Medical Management of Chronic Issues    Medical Management of Chronic Issues.     HPI:  Pt is a 89 y.o. female seen today for medical management of chronic diseases.   History of Present Illness The patient, with a history of stroke and heart failure, presents with worsening cognition and mood changes.  She has experienced worsening cognition since her stroke in December, now disoriented to time, stating it is April and fall when it is actually July and summer. She has been self-isolating, which may have contributed to her cognitive decline.  She experiences mood changes and has not felt up to getting out of bed. Her appetite varies; she eats well at breakfast but only about 50% of her lunch.  She has a history of heart failure and is experiencing swelling in her left arm, which she does not use much. Her blood pressure was initially low, improved after oral fluids. She is on losartan  25 mg daily and metoprolol  50 mg daily. There is concern  about holding her diuretic due to her heart failure, despite fluctuations in her weight.   Past Medical History:  Diagnosis Date   Back pain    Bruises easily    C. difficile diarrhea 02/14/2024   Coronary artery disease    NONOBSTRUCTIVE   Fatigue    Hyperlipidemia    Hypertension    Knee pain    OA (osteoarthritis)    Obesity    Palpitations    SOB (shortness of breath)    Past Surgical History:  Procedure Laterality Date   BREAST BIOPSY     LEFT BREAST   CARDIAC CATHETERIZATION  05/11/2006   OVERALL CARDIAC SIZE AND SILHOUETTE ARE NORMAL. EF ESTIMATED 60%   INTRAMEDULLARY (IM) NAIL INTERTROCHANTERIC Left 03/12/2023   Procedure: INTRAMEDULLARY (IM) NAIL INTERTROCHANTERIC;  Surgeon: Kendal Franky SQUIBB, MD;  Location: [facility] OR;  Service: Orthopedics;  Laterality: Left;   KIDNEY STONES  1982   KNEE ARTHROSCOPY  09/22/2008   VAGINAL HYSTERECTOMY      Allergies  Allergen Reactions   Codeine Other (See Comments)    Unknown reaction   Demerol [Meperidine Hcl] Other (See Comments)    Unknown reaction   Morphine And Codeine Other (See Comments)    Unknown reaction    Outpatient Encounter Medications as of 07/02/2024  Medication Sig   ALPRAZolam  (XANAX ) 1 MG tablet TAKE 1 TABLET BY MOUTH EVERY NIGHT AT BEDTIME   apixaban  (ELIQUIS ) 5 MG TABS tablet Take 1 tablet (5 mg total) by mouth 2 (two) times daily.   benzocaine (HURRICAINE) 20 % GEL  Use as directed 1 Application in the mouth or throat every 6 (six) hours as needed.   Cholecalciferol (VITAMIN D -3) 25 MCG (1000 UT) CAPS One by mouth daily.   estradiol (ESTRACE) 0.1 MG/GM vaginal cream Place 1 Applicatorful vaginally. At bedtime every Mon, Wed, Fri   famotidine  (PEPCID ) 20 MG tablet Take 20 mg by mouth at bedtime.   furosemide  (LASIX ) 40 MG tablet Take 40 mg by mouth 2 (two) times daily. Can give extra 40 mg tablet as needed for weight gain of 3 lbs overnight or 6 lbs in a week.   hydrocortisone (ANUSOL-HC) 25 MG suppository Place  25 mg rectally every 12 (twelve) hours as needed for hemorrhoids.   losartan  (COZAAR ) 50 MG tablet Take 50 mg by mouth daily.   metoprolol  tartrate (LOPRESSOR ) 100 MG tablet Take 100 mg by mouth 2 (two) times daily.   metoprolol  tartrate (LOPRESSOR ) 50 MG tablet Take 50 mg by mouth daily as needed (Palpitations).   nitroGLYCERIN  (NITROSTAT ) 0.4 MG SL tablet Place 1 tablet (0.4 mg total) under the tongue every 5 (five) minutes as needed for chest pain.   potassium chloride  SA (KLOR-CON  M) 20 MEQ tablet Take 20 mEq by mouth 2 (two) times daily.   rosuvastatin  (CRESTOR ) 10 MG tablet Take 1 tablet (10 mg total) by mouth daily.   sertraline (ZOLOFT) 100 MG tablet Take 100 mg by mouth daily.   traMADol  (ULTRAM ) 50 MG tablet Take 1 tablet (50 mg total) by mouth every 6 (six) hours as needed. Give 2 tablets by mouth every 6 hours as needed for severe pain.   Zinc Oxide (TRIPLE PASTE) 12.8 % ointment Apply 1 Application topically. To peri area and skin folds topically twice daily   ALPRAZolam  (XANAX ) 0.5 MG tablet Take 1 tablet (0.5 mg total) by mouth daily as needed for anxiety. Give one tablet by mouth every 24 hours as needed at bedtime. (Patient not taking: Reported on 07/02/2024)   hydrALAZINE  (APRESOLINE ) 25 MG tablet Take 1 tablet (25 mg total) by mouth 2 (two) times daily. (Patient not taking: Reported on 07/02/2024)   No facility-administered encounter medications on file as of 07/02/2024.    Review of Systems  Immunization History  Administered Date(s) Administered   Influenza, High Dose Seasonal PF 11/23/2022   Influenza-Unspecified 10/10/2023   Moderna Covid-19 Vaccine Bivalent Booster 27yrs & up 12/19/2023   PFIZER(Purple Top)SARS-COV-2 Vaccination 01/07/2020, 01/27/2020, 11/18/2020   PNEUMOCOCCAL CONJUGATE-20 12/14/2023   Pertinent  Health Maintenance Due  Topic Date Due   INFLUENZA VACCINE  07/18/2024   DEXA SCAN  Discontinued      01/10/2024    1:26 PM  Fall Risk  Falls in the  past year? 0  Was there an injury with Fall? 0  Fall Risk Category Calculator 0   Functional Status Survey:    Vitals:   07/02/24 0951  BP: 136/73  Pulse: 66  Resp: 20  Temp: (!) 97.1 F (36.2 C)  SpO2: 92%  Weight: 173 lb (78.5 kg)  Height: 5' 6 (1.676 m)   Body mass index is 27.92 kg/m. Physical Exam Cardiovascular:     Rate and Rhythm: Normal rate.     Pulses: Normal pulses.  Pulmonary:     Effort: Pulmonary effort is normal.  Musculoskeletal:     Comments: Left hemiparesis.  Neurological:     Mental Status: She is alert.     Labs reviewed: Recent Labs    12/11/23 1601 12/12/23 0604 12/12/23 9394 12/13/23 0943 12/20/23  0000 03/20/24 0000 04/03/24 0000 04/18/24 0000  NA 136  --  137 136   < > 140 140 139  K 3.2*  --  3.2* 3.6   < > 3.4* 3.5 3.0*  CL 98  --  99 101   < > 100 101 100  CO2 26  --  27 26   < > 32* 31* 31*  GLUCOSE 139*  --  116* 145*  --   --   --   --   BUN 14  --  15 12   < > 20 37* 22*  CREATININE 0.73  --  0.91 0.69   < > 0.6 0.9 0.7  CALCIUM  9.1  --  8.8* 8.9   < > 8.6* 8.5* 8.7  MG  --  2.0  --   --   --   --   --   --    < > = values in this interval not displayed.   Recent Labs    12/11/23 1601 12/20/23 0000 03/06/24 0000 04/18/24 0000  AST 22 15 31 27   ALT 10 8 20 16   ALKPHOS 74 110 81 74  BILITOT 1.0  --   --   --   PROT 7.1  --   --   --   ALBUMIN 3.4* 3.1* 2.6* 2.8*   Recent Labs    12/11/23 1601 12/12/23 0605 12/20/23 0000 01/21/24 0000 03/13/24 0000 04/18/24 0000  WBC 9.4 10.6* 12.8 10.0 9.1 10.5  NEUTROABS 6.7  --  9,562.00  --   --  6,983.00  HGB 14.2 13.7 15.2 13.0 12.8 12.8  HCT 43.2 40.3 46 40 39 38  MCV 92.9 89.6  --   --   --   --   PLT 347 342 426* 303 342 310   Lab Results  Component Value Date   TSH 4.35 08/27/2023   Lab Results  Component Value Date   HGBA1C 6.5 (H) 12/12/2023   Lab Results  Component Value Date   CHOL 185 12/12/2023   HDL 32 (L) 12/12/2023   LDLCALC 130 (H)  12/12/2023   TRIG 117 12/12/2023   CHOLHDL 5.8 12/12/2023    Significant Diagnostic Results in last 30 days:  No results found.  Assessment/Plan Heart failure Atrial Fibrillation Heart failure with fluctuating weight and blood pressure. Current blood pressure is 102/58 mmHg after oral fluids. Concerns about holding diuretics due to heart failure. Swelling in the left arm, which is not frequently used. Weight is variable, currently decreased. Pulse rate is 56 bpm, leading to temporary hold on metoprolol . - Continue losartan  at 25 mg daily. - Reduce metoprolol  to 50 mg daily, monitor pulse rate. - Consider adjusting diuretic to every other day if daily dosing is not effective.  Left Hemiparesis Stroke in December with current disorientation to time, possibly related to self-isolation post-stroke. She is alert but disoriented, stating incorrect date and season.  Depression Depression with reported low mood and lack of motivation to get out of bed. Current sertraline dose is being increased to improve mood management. - Increase sertraline to 100 mg daily.  Family/ staff Communication: nursing  Labs/tests ordered:  none

## 2024-07-04 ENCOUNTER — Non-Acute Institutional Stay (SKILLED_NURSING_FACILITY): Payer: Self-pay | Admitting: Student

## 2024-07-04 ENCOUNTER — Encounter: Payer: Self-pay | Admitting: Student

## 2024-07-04 DIAGNOSIS — E860 Dehydration: Secondary | ICD-10-CM

## 2024-07-04 DIAGNOSIS — I69354 Hemiplegia and hemiparesis following cerebral infarction affecting left non-dominant side: Secondary | ICD-10-CM

## 2024-07-04 DIAGNOSIS — F331 Major depressive disorder, recurrent, moderate: Secondary | ICD-10-CM | POA: Diagnosis not present

## 2024-07-04 DIAGNOSIS — I5032 Chronic diastolic (congestive) heart failure: Secondary | ICD-10-CM | POA: Diagnosis not present

## 2024-07-04 NOTE — Progress Notes (Signed)
 Location:  Other [facility].  Nursing Home Room Number: [facility] [address]A Place of Service:  SNF [phone]) Provider:  Abdul Fine, MD  Patient Care Team: Abdul Fine, MD as PCP - General (Family Medicine) Hobart Powell BRAVO, MD (Inactive) as PCP - Cardiology (Cardiology) Inocencio Soyla Lunger, MD as PCP - Electrophysiology (Cardiology)  Extended Emergency Contact Information Primary Emergency Contact: Vaughn MADELINE [place] Home Phone: [phone] Relation: Daughter Secondary Emergency Contact: Spoelstra,Danny Mobile Phone: [phone] Relation: Son  Code Status:  DNR Goals of care: Advanced Directive information    07/02/2024   10:02 AM  Advanced Directives  Does Patient Have a Medical Advance Directive? Yes  Type of Advance Directive Out of facility DNR (pink MOST or yellow form)  Does patient want to make changes to medical advance directive? No - Patient declined     Chief Complaint  Patient presents with   Mental Status Changes.     Mental Status Changes.     HPI:  Pt is a 89 y.o. female seen today for an acute visit for Mental Status Changes.  History of Present Illness The patient is a 89 year old with heart failure who presents with increased confusion and dehydration.  She has experienced increased confusion, which was notably worse today compared to yesterday. Housekeeping staff reported difficulty understanding her speech, and she was found to be dehydrated. After drinking water, her condition improved slightly. She expressed confusion about her surroundings and initially stated the year as 2025, later correcting herself.  She has a history of heart failure and has been experiencing fluctuations in her fluid balance. An extra dose of Lasix  was administered recently. She has been losing weight and has been more confused over the past few months. Her mouth feels very dry, and she has been drinking more fluids than usual.  She has a history  of depression and decreased activity levels, stating she doesn't feel like participating in community activities. She has been spending more time in bed and feels less motivated to engage with others. No pain is reported, but she mentions a 'stomach concern' and persistent dry mouth.  Her current medications include metoprolol , losartan , and a diuretic. She also takes Xanax , which she has been on for about a year, and she takes it as prescribed.  Social History - Living Situation: Resides at [facility] in [place]. - The patient is experiencing confusion, dehydration, and has a history of heart failure. There is a concern about her mental health and depression. She has been less active in the community and has a history of taking Xanax  for about a year. The patient is 89 years old and has a family history of heart disease. She has been experiencing weight changes and fluid balance issues.  Family History - Father: deceased at age 71 due to heart disease - Brother: deceased at age 57 due to heart disease    Past Medical History:  Diagnosis Date   Back pain    Bruises easily    C. difficile diarrhea 02/14/2024   Coronary artery disease    NONOBSTRUCTIVE   Fatigue    Hyperlipidemia    Hypertension    Knee pain    OA (osteoarthritis)    Obesity    Palpitations    SOB (shortness of breath)    Past Surgical History:  Procedure Laterality Date   BREAST BIOPSY     LEFT BREAST   CARDIAC CATHETERIZATION  05/11/2006   OVERALL CARDIAC SIZE AND  SILHOUETTE ARE NORMAL. EF ESTIMATED 60%   INTRAMEDULLARY (IM) NAIL INTERTROCHANTERIC Left 03/12/2023   Procedure: INTRAMEDULLARY (IM) NAIL INTERTROCHANTERIC;  Surgeon: Kendal Franky SQUIBB, MD;  Location: [facility] OR;  Service: Orthopedics;  Laterality: Left;   KIDNEY STONES  1982   KNEE ARTHROSCOPY  09/22/2008   VAGINAL HYSTERECTOMY      Allergies  Allergen Reactions   Codeine Other (See Comments)    Unknown reaction   Demerol [Meperidine  Hcl] Other (See Comments)    Unknown reaction   Morphine And Codeine Other (See Comments)    Unknown reaction    Outpatient Encounter Medications as of 07/04/2024  Medication Sig   ALPRAZolam  (XANAX ) 1 MG tablet TAKE 1 TABLET BY MOUTH EVERY NIGHT AT BEDTIME   apixaban  (ELIQUIS ) 5 MG TABS tablet Take 1 tablet (5 mg total) by mouth 2 (two) times daily.   benzocaine (HURRICAINE) 20 % GEL Use as directed 1 Application in the mouth or throat every 6 (six) hours as needed.   Cholecalciferol (VITAMIN D -3) 25 MCG (1000 UT) CAPS One by mouth daily.   estradiol (ESTRACE) 0.1 MG/GM vaginal cream Place 1 Applicatorful vaginally. At bedtime every Mon, Wed, Fri   famotidine  (PEPCID ) 20 MG tablet Take 20 mg by mouth at bedtime.   furosemide  (LASIX ) 40 MG tablet Take 40 mg by mouth 2 (two) times daily. Can give extra 40 mg tablet as needed for weight gain of 3 lbs overnight or 6 lbs in a week.   hydrocortisone (ANUSOL-HC) 25 MG suppository Place 25 mg rectally every 12 (twelve) hours as needed for hemorrhoids.   losartan  (COZAAR ) 50 MG tablet Take 50 mg by mouth daily. (Patient taking differently: Take 25 mg by mouth daily.)   metoprolol  tartrate (LOPRESSOR ) 50 MG tablet Take 50 mg by mouth daily as needed (Palpitations). (Patient taking differently: Take 50 mg by mouth 2 (two) times daily.)   nitroGLYCERIN  (NITROSTAT ) 0.4 MG SL tablet Place 1 tablet (0.4 mg total) under the tongue every 5 (five) minutes as needed for chest pain.   potassium chloride  SA (KLOR-CON  M) 20 MEQ tablet Take 20 mEq by mouth 2 (two) times daily.   rosuvastatin  (CRESTOR ) 10 MG tablet Take 1 tablet (10 mg total) by mouth daily.   sertraline (ZOLOFT) 100 MG tablet Take 100 mg by mouth daily.   traMADol  (ULTRAM ) 50 MG tablet Take 1 tablet (50 mg total) by mouth every 6 (six) hours as needed. Give 2 tablets by mouth every 6 hours as needed for severe pain.   Zinc Oxide (TRIPLE PASTE) 12.8 % ointment Apply 1 Application topically. To peri  area and skin folds topically twice daily   ALPRAZolam  (XANAX ) 0.5 MG tablet Take 1 tablet (0.5 mg total) by mouth daily as needed for anxiety. Give one tablet by mouth every 24 hours as needed at bedtime. (Patient not taking: Reported on 07/04/2024)   hydrALAZINE  (APRESOLINE ) 25 MG tablet Take 1 tablet (25 mg total) by mouth 2 (two) times daily. (Patient not taking: Reported on 07/04/2024)   metoprolol  tartrate (LOPRESSOR ) 100 MG tablet Take 100 mg by mouth 2 (two) times daily. (Patient not taking: Reported on 07/04/2024)   No facility-administered encounter medications on file as of 07/04/2024.    Review of Systems  Immunization History  Administered Date(s) Administered   Influenza, High Dose Seasonal PF 11/23/2022   Influenza-Unspecified 10/10/2023   Moderna Covid-19 Vaccine Bivalent Booster 62yrs & up 12/19/2023   PFIZER(Purple Top)SARS-COV-2 Vaccination 01/07/2020, 01/27/2020, 11/18/2020   PNEUMOCOCCAL CONJUGATE-20  12/14/2023   Pertinent  Health Maintenance Due  Topic Date Due   INFLUENZA VACCINE  07/18/2024   DEXA SCAN  Discontinued      01/10/2024    1:26 PM  Fall Risk  Falls in the past year? 0  Was there an injury with Fall? 0  Fall Risk Category Calculator 0   Functional Status Survey:    Vitals:   07/04/24 1404  BP: (!) 110/58  Pulse: 63  Resp: 20  Temp: (!) 97.1 F (36.2 C)  SpO2: 92%  Weight: 177 lb 6.4 oz (80.5 kg)  Height: 5' 6 (1.676 m)   Body mass index is 28.63 kg/m. Physical Exam  Labs reviewed: Recent Labs    12/11/23 1601 12/12/23 0604 12/12/23 0605 12/13/23 0943 12/20/23 0000 04/03/24 0000 04/18/24 0000 05/08/24 0000  NA 136  --  137 136   < > 140 139 140  K 3.2*  --  3.2* 3.6   < > 3.5 3.0* 3.9  CL 98  --  99 101   < > 101 100 101  CO2 26  --  27 26   < > 31* 31* 32*  GLUCOSE 139*  --  116* 145*  --   --   --   --   BUN 14  --  15 12   < > 37* 22* 21  CREATININE 0.73  --  0.91 0.69   < > 0.9 0.7 0.7  CALCIUM  9.1  --  8.8* 8.9   <  > 8.5* 8.7 9.0  MG  --  2.0  --   --   --   --   --   --    < > = values in this interval not displayed.   Recent Labs    12/11/23 1601 12/20/23 0000 03/06/24 0000 04/18/24 0000  AST 22 15 31 27   ALT 10 8 20 16   ALKPHOS 74 110 81 74  BILITOT 1.0  --   --   --   PROT 7.1  --   --   --   ALBUMIN 3.4* 3.1* 2.6* 2.8*   Recent Labs    12/11/23 1601 12/12/23 0605 12/20/23 0000 01/21/24 0000 03/13/24 0000 04/18/24 0000  WBC 9.4 10.6* 12.8 10.0 9.1 10.5  NEUTROABS 6.7  --  9,562.00  --   --  6,983.00  HGB 14.2 13.7 15.2 13.0 12.8 12.8  HCT 43.2 40.3 46 40 39 38  MCV 92.9 89.6  --   --   --   --   PLT 347 342 426* 303 342 310   Lab Results  Component Value Date   TSH 4.35 08/27/2023   Lab Results  Component Value Date   HGBA1C 6.5 (H) 12/12/2023   Lab Results  Component Value Date   CHOL 185 12/12/2023   HDL 32 (L) 12/12/2023   LDLCALC 130 (H) 12/12/2023   TRIG 117 12/12/2023   CHOLHDL 5.8 12/12/2023    Significant Diagnostic Results in last 30 days:  No results found.  Assessment/Plan Heart failure Worsening heart failure with recent weight gain and fluid balance issues. Difficulty balancing fluid status with diuretics leading to dehydration and fluid overload. - Decrease diuretic dosage to prevent dehydration - Monitor fluid balance closely - Repeat labs on Monday to assess kidney function and electrolytes  Dehydration Signs of dehydration, possibly exacerbated by extra dose of diuretic. Complaints of dry mouth and increased thirst. - Encourage oral fluid intake - Monitor hydration status closely -  Monitor mental status closely  Depression Reports of feeling more depressed and less active in the community. Concerns about decreased participation in activities and potential impact of Xanax  on mood. - Encourage participation in community activities - Discuss potential impact of Xanax  on mood with patient and family  Stroke Stroke contributing to current  health status and activity level. No acute changes noted related to stroke. - Encourage mobility to prevent complications such as pneumonia  Family/ staff Communication: nursing, left VM with family  Labs/tests ordered:  none

## 2024-07-05 DIAGNOSIS — R278 Other lack of coordination: Secondary | ICD-10-CM | POA: Diagnosis not present

## 2024-07-05 DIAGNOSIS — M6281 Muscle weakness (generalized): Secondary | ICD-10-CM | POA: Diagnosis not present

## 2024-07-05 DIAGNOSIS — I69398 Other sequelae of cerebral infarction: Secondary | ICD-10-CM | POA: Diagnosis not present

## 2024-07-05 DIAGNOSIS — I130 Hypertensive heart and chronic kidney disease with heart failure and stage 1 through stage 4 chronic kidney disease, or unspecified chronic kidney disease: Secondary | ICD-10-CM | POA: Diagnosis not present

## 2024-07-05 DIAGNOSIS — I2510 Atherosclerotic heart disease of native coronary artery without angina pectoris: Secondary | ICD-10-CM | POA: Diagnosis not present

## 2024-07-05 DIAGNOSIS — Z9181 History of falling: Secondary | ICD-10-CM | POA: Diagnosis not present

## 2024-07-07 DIAGNOSIS — I69398 Other sequelae of cerebral infarction: Secondary | ICD-10-CM | POA: Diagnosis not present

## 2024-07-07 DIAGNOSIS — I5032 Chronic diastolic (congestive) heart failure: Secondary | ICD-10-CM | POA: Diagnosis not present

## 2024-07-07 DIAGNOSIS — I2510 Atherosclerotic heart disease of native coronary artery without angina pectoris: Secondary | ICD-10-CM | POA: Diagnosis not present

## 2024-07-07 DIAGNOSIS — M6281 Muscle weakness (generalized): Secondary | ICD-10-CM | POA: Diagnosis not present

## 2024-07-07 DIAGNOSIS — Z9181 History of falling: Secondary | ICD-10-CM | POA: Diagnosis not present

## 2024-07-07 DIAGNOSIS — R278 Other lack of coordination: Secondary | ICD-10-CM | POA: Diagnosis not present

## 2024-07-07 DIAGNOSIS — I130 Hypertensive heart and chronic kidney disease with heart failure and stage 1 through stage 4 chronic kidney disease, or unspecified chronic kidney disease: Secondary | ICD-10-CM | POA: Diagnosis not present

## 2024-07-07 LAB — COMPREHENSIVE METABOLIC PANEL WITH GFR
Calcium: 9 (ref 8.7–10.7)
eGFR: 80

## 2024-07-07 LAB — CBC AND DIFFERENTIAL
HCT: 42 (ref 36–46)
Hemoglobin: 13.4 (ref 12.0–16.0)
Neutrophils Absolute: 4948
Platelets: 314 K/uL (ref 150–400)
WBC: 8.9

## 2024-07-07 LAB — BASIC METABOLIC PANEL WITH GFR
BUN: 19 (ref 4–21)
CO2: 34 — AB (ref 13–22)
Chloride: 100 (ref 99–108)
Creatinine: 0.7 (ref 0.5–1.1)
Glucose: 105
Potassium: 3.8 meq/L (ref 3.5–5.1)
Sodium: 140 (ref 137–147)

## 2024-07-07 LAB — CBC: RBC: 4.38 (ref 3.87–5.11)

## 2024-07-10 DIAGNOSIS — R197 Diarrhea, unspecified: Secondary | ICD-10-CM | POA: Diagnosis not present

## 2024-07-10 LAB — CBC AND DIFFERENTIAL
HCT: 39 (ref 36–46)
Hemoglobin: 12.6 (ref 12.0–16.0)
Neutrophils Absolute: 5896
Platelets: 286 K/uL (ref 150–400)
WBC: 9.3

## 2024-07-10 LAB — BASIC METABOLIC PANEL WITH GFR
BUN: 18 (ref 4–21)
CO2: 23 — AB (ref 13–22)
Chloride: 105 (ref 99–108)
Creatinine: 0.7 (ref 0.5–1.1)
Glucose: 201
Potassium: 3.9 meq/L (ref 3.5–5.1)
Sodium: 136 — AB (ref 137–147)

## 2024-07-10 LAB — COMPREHENSIVE METABOLIC PANEL WITH GFR
Calcium: 8.7 (ref 8.7–10.7)
eGFR: 75

## 2024-07-10 LAB — CBC: RBC: 4.05 (ref 3.87–5.11)

## 2024-07-11 DIAGNOSIS — R197 Diarrhea, unspecified: Secondary | ICD-10-CM | POA: Diagnosis not present

## 2024-07-14 ENCOUNTER — Non-Acute Institutional Stay (SKILLED_NURSING_FACILITY): Payer: Self-pay | Admitting: Student

## 2024-07-14 ENCOUNTER — Encounter: Payer: Self-pay | Admitting: Student

## 2024-07-14 DIAGNOSIS — I10 Essential (primary) hypertension: Secondary | ICD-10-CM | POA: Diagnosis not present

## 2024-07-14 DIAGNOSIS — I69398 Other sequelae of cerebral infarction: Secondary | ICD-10-CM | POA: Diagnosis not present

## 2024-07-14 DIAGNOSIS — I5032 Chronic diastolic (congestive) heart failure: Secondary | ICD-10-CM | POA: Diagnosis not present

## 2024-07-14 DIAGNOSIS — F03B0 Unspecified dementia, moderate, without behavioral disturbance, psychotic disturbance, mood disturbance, and anxiety: Secondary | ICD-10-CM

## 2024-07-14 DIAGNOSIS — I69354 Hemiplegia and hemiparesis following cerebral infarction affecting left non-dominant side: Secondary | ICD-10-CM | POA: Diagnosis not present

## 2024-07-14 DIAGNOSIS — Z9181 History of falling: Secondary | ICD-10-CM | POA: Diagnosis not present

## 2024-07-14 DIAGNOSIS — I130 Hypertensive heart and chronic kidney disease with heart failure and stage 1 through stage 4 chronic kidney disease, or unspecified chronic kidney disease: Secondary | ICD-10-CM | POA: Diagnosis not present

## 2024-07-14 DIAGNOSIS — I4821 Permanent atrial fibrillation: Secondary | ICD-10-CM | POA: Diagnosis not present

## 2024-07-14 DIAGNOSIS — M6281 Muscle weakness (generalized): Secondary | ICD-10-CM | POA: Diagnosis not present

## 2024-07-14 DIAGNOSIS — I2510 Atherosclerotic heart disease of native coronary artery without angina pectoris: Secondary | ICD-10-CM | POA: Diagnosis not present

## 2024-07-14 DIAGNOSIS — R278 Other lack of coordination: Secondary | ICD-10-CM | POA: Diagnosis not present

## 2024-07-14 NOTE — Progress Notes (Signed)
 Location:  Other Nursing Home Room Number: [id number] Place of Service:  SNF [phone]) Provider:  Abdul Abdul, Richerd, MD  Patient Care Team: Abdul Richerd, MD as PCP - General (Family Medicine) Hobart Powell BRAVO, MD (Inactive) as PCP - Cardiology (Cardiology) Inocencio Soyla Lunger, MD as PCP - Electrophysiology (Cardiology)  Extended Emergency Contact Information Primary Emergency Contact: Clyne,Danny Mobile Phone: [phone] Relation: Son Secondary Emergency Contact: Vaughn DEAL  [place] Home Phone: [phone] Relation: Daughter  Code Status:  DNR Goals of care: Advanced Directive information    07/02/2024   10:02 AM  Advanced Directives  Does Patient Have a Medical Advance Directive? Yes  Type of Advance Directive Out of facility DNR (pink MOST or yellow form)  Does patient want to make changes to medical advance directive? No - Patient declined     Chief Complaint  Patient presents with   Altered Mental Status    HPI:  Pt is a 89 y.o. female seen today for an acute visit.  Discussed the use of AI scribe software for clinical note transcription with the patient, who gave verbal consent to proceed.  History of Present Illness   History of Present Illness The patient, with a history of stroke and recurrent Clostridioides difficile infection, presents with increased confusion and diarrhea.  She has been experiencing increased confusion over the past few days, with fluctuations in severity. Her family noted significant confusion on the night of July 17th, but she appeared fine the following morning. She can recall her name and location but struggles with recent events, such as family visits.  There is a concern for recurrent Clostridioides difficile infection as she has been experiencing diarrhea. She has a history of C. diff infection, which was difficult to treat in the past, requiring first, second, and third-line medications. A  stool culture is pending to confirm this. She has not had any recent antibiotics that could have triggered a recurrence. Sitting in a particular chair seems to exacerbate her diarrhea.  She has a history of a stroke affecting her left side, resulting in an inability to use her left hand and weakness in her left leg. She also sustained a fracture to her left leg a year and a half ago, contributing to her mobility issues. She is unable to get up by herself and relies on assistance.  She has been more reclusive, staying in her room and not engaging in activities she used to enjoy, such as bingo. She wants to socialize but finds it difficult due to hearing issues, stating, 'I can't hear. I'm just yelling at myself.' She is living in a community setting.    Past Medical History:  Diagnosis Date   Back pain    Bruises easily    C. difficile diarrhea 02/14/2024   Coronary artery disease    NONOBSTRUCTIVE   Fatigue    Hyperlipidemia    Hypertension    Knee pain    OA (osteoarthritis)    Obesity    Palpitations    SOB (shortness of breath)    Past Surgical History:  Procedure Laterality Date   BREAST BIOPSY     LEFT BREAST   CARDIAC CATHETERIZATION  05/11/2006   OVERALL CARDIAC SIZE AND SILHOUETTE ARE NORMAL. EF ESTIMATED 60%   INTRAMEDULLARY (IM) NAIL INTERTROCHANTERIC Left 03/12/2023   Procedure: INTRAMEDULLARY (IM) NAIL INTERTROCHANTERIC;  Surgeon: Kendal Franky SQUIBB, MD;  Location: [facility] OR;  Service: Orthopedics;  Laterality: Left;   KIDNEY STONES  1982  KNEE ARTHROSCOPY  09/22/2008   VAGINAL HYSTERECTOMY      Allergies  Allergen Reactions   Codeine Other (See Comments)    Unknown reaction   Demerol [Meperidine Hcl] Other (See Comments)    Unknown reaction   Morphine And Codeine Other (See Comments)    Unknown reaction    Outpatient Encounter Medications as of 07/14/2024  Medication Sig   ALPRAZolam  (XANAX ) 1 MG tablet TAKE 1 TABLET BY MOUTH EVERY NIGHT AT BEDTIME   apixaban   (ELIQUIS ) 5 MG TABS tablet Take 1 tablet (5 mg total) by mouth 2 (two) times daily.   benzocaine (HURRICAINE) 20 % GEL Use as directed 1 Application in the mouth or throat every 6 (six) hours as needed.   Cholecalciferol (VITAMIN D -3) 25 MCG (1000 UT) CAPS One by mouth daily.   estradiol (ESTRACE) 0.1 MG/GM vaginal cream Place 1 Applicatorful vaginally. At bedtime every Mon, Wed, Fri   famotidine  (PEPCID ) 20 MG tablet Take 20 mg by mouth at bedtime.   furosemide  (LASIX ) 40 MG tablet Take 40 mg by mouth 2 (two) times daily. Can give extra 40 mg tablet as needed for weight gain of 3 lbs overnight or 6 lbs in a week.   hydrocortisone (ANUSOL-HC) 25 MG suppository Place 25 mg rectally every 12 (twelve) hours as needed for hemorrhoids.   losartan  (COZAAR ) 50 MG tablet Take 25 mg by mouth daily.   metoprolol  tartrate (LOPRESSOR ) 50 MG tablet Take 50 mg by mouth 2 (two) times daily.   nitroGLYCERIN  (NITROSTAT ) 0.4 MG SL tablet Place 1 tablet (0.4 mg total) under the tongue every 5 (five) minutes as needed for chest pain.   potassium chloride  SA (KLOR-CON  M) 20 MEQ tablet Take 20 mEq by mouth 2 (two) times daily.   rosuvastatin  (CRESTOR ) 10 MG tablet Take 1 tablet (10 mg total) by mouth daily.   sertraline (ZOLOFT) 100 MG tablet Take 100 mg by mouth daily.   traMADol  (ULTRAM ) 50 MG tablet Take 1 tablet (50 mg total) by mouth every 6 (six) hours as needed. Give 2 tablets by mouth every 6 hours as needed for severe pain.   Zinc Oxide (TRIPLE PASTE) 12.8 % ointment Apply 1 Application topically. To peri area and skin folds topically twice daily   No facility-administered encounter medications on file as of 07/14/2024.    Review of Systems  Immunization History  Administered Date(s) Administered   Influenza, High Dose Seasonal PF 11/23/2022   Influenza-Unspecified 10/10/2023   Moderna Covid-19 Vaccine Bivalent Booster 56yrs & up 12/19/2023   PFIZER(Purple Top)SARS-COV-2 Vaccination 01/07/2020,  01/27/2020, 11/18/2020   PNEUMOCOCCAL CONJUGATE-20 12/14/2023   Pertinent  Health Maintenance Due  Topic Date Due   INFLUENZA VACCINE  07/18/2024   DEXA SCAN  Discontinued      01/10/2024    1:26 PM  Fall Risk  Falls in the past year? 0  Was there an injury with Fall? 0  Fall Risk Category Calculator 0   Functional Status Survey:    Vitals:   07/14/24 0851  BP: (!) 140/76  Pulse: 66  Resp: 18  Temp: (!) 97.1 F (36.2 C)  SpO2: 92%  Weight: 177 lb 3.2 oz (80.4 kg)   Body mass index is 28.6 kg/m. Physical Exam GENERAL: Confused appearance. ABDOMEN: Soft, nondistended. NEUROLOGICAL: Alert and oriented to self and city. Labs reviewed: Recent Labs    12/11/23 1601 12/12/23 0604 12/12/23 0605 12/13/23 0943 12/20/23 0000 04/03/24 0000 04/18/24 0000 05/08/24 0000  NA 136  --  137 136   < > 140 139 140  K 3.2*  --  3.2* 3.6   < > 3.5 3.0* 3.9  CL 98  --  99 101   < > 101 100 101  CO2 26  --  27 26   < > 31* 31* 32*  GLUCOSE 139*  --  116* 145*  --   --   --   --   BUN 14  --  15 12   < > 37* 22* 21  CREATININE 0.73  --  0.91 0.69   < > 0.9 0.7 0.7  CALCIUM  9.1  --  8.8* 8.9   < > 8.5* 8.7 9.0  MG  --  2.0  --   --   --   --   --   --    < > = values in this interval not displayed.   Recent Labs    12/11/23 1601 12/20/23 0000 03/06/24 0000 04/18/24 0000  AST 22 15 31 27   ALT 10 8 20 16   ALKPHOS 74 110 81 74  BILITOT 1.0  --   --   --   PROT 7.1  --   --   --   ALBUMIN 3.4* 3.1* 2.6* 2.8*   Recent Labs    12/11/23 1601 12/12/23 0605 12/20/23 0000 01/21/24 0000 03/13/24 0000 04/18/24 0000  WBC 9.4 10.6* 12.8 10.0 9.1 10.5  NEUTROABS 6.7  --  9,562.00  --   --  6,983.00  HGB 14.2 13.7 15.2 13.0 12.8 12.8  HCT 43.2 40.3 46 40 39 38  MCV 92.9 89.6  --   --   --   --   PLT 347 342 426* 303 342 310   Lab Results  Component Value Date   TSH 4.35 08/27/2023   Lab Results  Component Value Date   HGBA1C 6.5 (H) 12/12/2023   Lab Results   Component Value Date   CHOL 185 12/12/2023   HDL 32 (L) 12/12/2023   LDLCALC 130 (H) 12/12/2023   TRIG 117 12/12/2023   CHOLHDL 5.8 12/12/2023    Significant Diagnostic Results in last 30 days:  No results found.  Assessment and Plan Cognitive decline Cognitive decline with fluctuating confusion over the past few days. Labs including kidney function, liver function, and white blood cell count are normal, ruling out infection. No fever present. Recent increase in depression medication could be contributing to confusion. Spent 15 minutes discussing goals of care with her son. They do not desire further hospitalizations, however would like to do all that can be done in the facility for her continued stablility.   Depression Increased medication for depression due to social withdrawal and seclusion. - Encourage participation in social activities like bingo to improve mood and social interaction  Diarrhea Recent onset of diarrhea with pending stool culture results. History of recurrent C. diff infection complicates the differential diagnosis. No recent antibiotic use, but potential immune system decline could contribute to recurrence. Discussion with family about management options if C. diff is positive, including potential IV treatment. Discussed concern if she does have recurrence as this will be her third time with C.diff this year. Could ba a sign of decline in her immune system as she has no other known exposures.  - Await stool culture results - Discuss potential IV treatment options with pharmacist if C. diff is positive  Hearing difficulty Hearing difficulty impacting social interactions. Family assisted with hearing aid placement recently.  Family/ staff Communication: nursing, danny  Labs/tests ordered:  pending stool culture

## 2024-07-15 DIAGNOSIS — Z9181 History of falling: Secondary | ICD-10-CM | POA: Diagnosis not present

## 2024-07-15 DIAGNOSIS — I69398 Other sequelae of cerebral infarction: Secondary | ICD-10-CM | POA: Diagnosis not present

## 2024-07-15 DIAGNOSIS — R278 Other lack of coordination: Secondary | ICD-10-CM | POA: Diagnosis not present

## 2024-07-15 DIAGNOSIS — I2510 Atherosclerotic heart disease of native coronary artery without angina pectoris: Secondary | ICD-10-CM | POA: Diagnosis not present

## 2024-07-15 DIAGNOSIS — M6281 Muscle weakness (generalized): Secondary | ICD-10-CM | POA: Diagnosis not present

## 2024-07-15 DIAGNOSIS — I130 Hypertensive heart and chronic kidney disease with heart failure and stage 1 through stage 4 chronic kidney disease, or unspecified chronic kidney disease: Secondary | ICD-10-CM | POA: Diagnosis not present

## 2024-07-17 DIAGNOSIS — F4323 Adjustment disorder with mixed anxiety and depressed mood: Secondary | ICD-10-CM | POA: Diagnosis not present

## 2024-07-21 DIAGNOSIS — Z9181 History of falling: Secondary | ICD-10-CM | POA: Diagnosis not present

## 2024-07-21 DIAGNOSIS — M6281 Muscle weakness (generalized): Secondary | ICD-10-CM | POA: Diagnosis not present

## 2024-07-21 DIAGNOSIS — C44629 Squamous cell carcinoma of skin of left upper limb, including shoulder: Secondary | ICD-10-CM | POA: Diagnosis not present

## 2024-07-21 DIAGNOSIS — I69398 Other sequelae of cerebral infarction: Secondary | ICD-10-CM | POA: Diagnosis not present

## 2024-07-21 DIAGNOSIS — I130 Hypertensive heart and chronic kidney disease with heart failure and stage 1 through stage 4 chronic kidney disease, or unspecified chronic kidney disease: Secondary | ICD-10-CM | POA: Diagnosis not present

## 2024-07-21 DIAGNOSIS — I2510 Atherosclerotic heart disease of native coronary artery without angina pectoris: Secondary | ICD-10-CM | POA: Diagnosis not present

## 2024-07-21 DIAGNOSIS — R278 Other lack of coordination: Secondary | ICD-10-CM | POA: Diagnosis not present

## 2024-07-22 DIAGNOSIS — F4323 Adjustment disorder with mixed anxiety and depressed mood: Secondary | ICD-10-CM | POA: Diagnosis not present

## 2024-07-22 DIAGNOSIS — I69398 Other sequelae of cerebral infarction: Secondary | ICD-10-CM | POA: Diagnosis not present

## 2024-07-22 DIAGNOSIS — I2510 Atherosclerotic heart disease of native coronary artery without angina pectoris: Secondary | ICD-10-CM | POA: Diagnosis not present

## 2024-07-22 DIAGNOSIS — M6281 Muscle weakness (generalized): Secondary | ICD-10-CM | POA: Diagnosis not present

## 2024-07-22 DIAGNOSIS — R278 Other lack of coordination: Secondary | ICD-10-CM | POA: Diagnosis not present

## 2024-07-22 DIAGNOSIS — I130 Hypertensive heart and chronic kidney disease with heart failure and stage 1 through stage 4 chronic kidney disease, or unspecified chronic kidney disease: Secondary | ICD-10-CM | POA: Diagnosis not present

## 2024-07-22 DIAGNOSIS — Z9181 History of falling: Secondary | ICD-10-CM | POA: Diagnosis not present

## 2024-07-23 DIAGNOSIS — I2510 Atherosclerotic heart disease of native coronary artery without angina pectoris: Secondary | ICD-10-CM | POA: Diagnosis not present

## 2024-07-23 DIAGNOSIS — I130 Hypertensive heart and chronic kidney disease with heart failure and stage 1 through stage 4 chronic kidney disease, or unspecified chronic kidney disease: Secondary | ICD-10-CM | POA: Diagnosis not present

## 2024-07-23 DIAGNOSIS — M6281 Muscle weakness (generalized): Secondary | ICD-10-CM | POA: Diagnosis not present

## 2024-07-23 DIAGNOSIS — I69398 Other sequelae of cerebral infarction: Secondary | ICD-10-CM | POA: Diagnosis not present

## 2024-07-23 DIAGNOSIS — R278 Other lack of coordination: Secondary | ICD-10-CM | POA: Diagnosis not present

## 2024-07-23 DIAGNOSIS — Z9181 History of falling: Secondary | ICD-10-CM | POA: Diagnosis not present

## 2024-07-24 DIAGNOSIS — Z8619 Personal history of other infectious and parasitic diseases: Secondary | ICD-10-CM | POA: Diagnosis not present

## 2024-07-24 DIAGNOSIS — R197 Diarrhea, unspecified: Secondary | ICD-10-CM | POA: Diagnosis not present

## 2024-07-29 ENCOUNTER — Non-Acute Institutional Stay (SKILLED_NURSING_FACILITY): Payer: Self-pay | Admitting: Nurse Practitioner

## 2024-07-29 ENCOUNTER — Encounter: Payer: Self-pay | Admitting: Nurse Practitioner

## 2024-07-29 DIAGNOSIS — Z9181 History of falling: Secondary | ICD-10-CM | POA: Diagnosis not present

## 2024-07-29 DIAGNOSIS — I2510 Atherosclerotic heart disease of native coronary artery without angina pectoris: Secondary | ICD-10-CM | POA: Diagnosis not present

## 2024-07-29 DIAGNOSIS — A0471 Enterocolitis due to Clostridium difficile, recurrent: Secondary | ICD-10-CM

## 2024-07-29 DIAGNOSIS — F4323 Adjustment disorder with mixed anxiety and depressed mood: Secondary | ICD-10-CM | POA: Diagnosis not present

## 2024-07-29 DIAGNOSIS — F1320 Sedative, hypnotic or anxiolytic dependence, uncomplicated: Secondary | ICD-10-CM

## 2024-07-29 DIAGNOSIS — R278 Other lack of coordination: Secondary | ICD-10-CM | POA: Diagnosis not present

## 2024-07-29 DIAGNOSIS — I69398 Other sequelae of cerebral infarction: Secondary | ICD-10-CM | POA: Diagnosis not present

## 2024-07-29 DIAGNOSIS — I130 Hypertensive heart and chronic kidney disease with heart failure and stage 1 through stage 4 chronic kidney disease, or unspecified chronic kidney disease: Secondary | ICD-10-CM | POA: Diagnosis not present

## 2024-07-29 DIAGNOSIS — M6281 Muscle weakness (generalized): Secondary | ICD-10-CM | POA: Diagnosis not present

## 2024-07-29 MED ORDER — ALPRAZOLAM 1 MG PO TABS: 1.0000 mg | ORAL_TABLET | Freq: Every day | ORAL | 5 refills | Status: DC

## 2024-07-29 NOTE — Progress Notes (Signed)
 Location:  Other [facility].  Nursing Home Room Number: [facility] [id number] Place of Service:  SNF [phone]) Harlene An, NP  PCP: Abdul Fine, MD  Patient Care Team: Abdul Fine, MD as PCP - General (Family Medicine) Hobart Powell BRAVO, MD (Inactive) as PCP - Cardiology (Cardiology) Inocencio Soyla Lunger, MD as PCP - Electrophysiology (Cardiology)  Extended Emergency Contact Information Primary Emergency Contact: Abdella,Danny Mobile Phone: [phone] Relation: Son Secondary Emergency Contact: Gordon,NORMA  [place] Home Phone: [phone] Relation: Daughter  Goals of care: Advanced Directive information    07/02/2024   10:02 AM  Advanced Directives  Does Patient Have a Medical Advance Directive? Yes  Type of Advance Directive Out of facility DNR (pink MOST or yellow form)  Does patient want to make changes to medical advance directive? No - Patient declined     Chief Complaint  Patient presents with   C Diff    C Diff    HPI:  Pt is a 89 y.o. female seen today for an acute visit for C Diff. Pt with hx of recurrent c diff. Tested positive in January and had been off and on vanc without resolution. She was placed of dificid in March and diarrhea improved and then tested negative.  After c diff stool was chronically soft and loose then smell started to get worse last month.  She eats with assistance staff and staff reports overall eats well.  No fevers or chills.  Staff reports more confusion.    Past Medical History:  Diagnosis Date   Back pain    Bruises easily    C. difficile diarrhea 02/14/2024   Coronary artery disease    NONOBSTRUCTIVE   Fatigue    Hyperlipidemia    Hypertension    Knee pain    OA (osteoarthritis)    Obesity    Palpitations    SOB (shortness of breath)    Past Surgical History:  Procedure Laterality Date   BREAST BIOPSY     LEFT BREAST   CARDIAC CATHETERIZATION  05/11/2006   OVERALL CARDIAC SIZE AND  SILHOUETTE ARE NORMAL. EF ESTIMATED 60%   INTRAMEDULLARY (IM) NAIL INTERTROCHANTERIC Left 03/12/2023   Procedure: INTRAMEDULLARY (IM) NAIL INTERTROCHANTERIC;  Surgeon: Kendal Franky SQUIBB, MD;  Location: [facility] OR;  Service: Orthopedics;  Laterality: Left;   KIDNEY STONES  1982   KNEE ARTHROSCOPY  09/22/2008   VAGINAL HYSTERECTOMY      Allergies  Allergen Reactions   Codeine Other (See Comments)    Unknown reaction   Demerol [Meperidine Hcl] Other (See Comments)    Unknown reaction   Morphine And Codeine Other (See Comments)    Unknown reaction    Outpatient Encounter Medications as of 07/29/2024  Medication Sig   ALPRAZolam  (XANAX ) 1 MG tablet TAKE 1 TABLET BY MOUTH EVERY NIGHT AT BEDTIME   apixaban  (ELIQUIS ) 5 MG TABS tablet Take 1 tablet (5 mg total) by mouth 2 (two) times daily.   benzocaine (HURRICAINE) 20 % GEL Use as directed 1 Application in the mouth or throat every 6 (six) hours as needed.   Cholecalciferol (VITAMIN D -3) 25 MCG (1000 UT) CAPS One by mouth daily.   estradiol (ESTRACE) 0.1 MG/GM vaginal cream Place 1 Applicatorful vaginally. At bedtime every Mon, Wed, Fri   famotidine  (PEPCID ) 20 MG tablet Take 20 mg by mouth at bedtime.   furosemide  (LASIX ) 40 MG tablet Take 40 mg by mouth 2 (two) times daily. Can give extra 40 mg tablet as needed for  weight gain of 3 lbs overnight or 6 lbs in a week. (Patient taking differently: give 40 mg tablet as needed for weight gain of 3 lbs overnight or 5lbs in a week.)   hydrocortisone (ANUSOL-HC) 25 MG suppository Place 25 mg rectally every 12 (twelve) hours as needed for hemorrhoids.   losartan  (COZAAR ) 50 MG tablet Take 25 mg by mouth daily.   metoprolol  tartrate (LOPRESSOR ) 50 MG tablet Take 50 mg by mouth 2 (two) times daily.   nitroGLYCERIN  (NITROSTAT ) 0.4 MG SL tablet Place 1 tablet (0.4 mg total) under the tongue every 5 (five) minutes as needed for chest pain.   potassium chloride  SA (KLOR-CON  M) 20 MEQ tablet Take 20 mEq by mouth 2  (two) times daily.   rosuvastatin  (CRESTOR ) 10 MG tablet Take 1 tablet (10 mg total) by mouth daily.   sertraline (ZOLOFT) 100 MG tablet Take 100 mg by mouth daily.   traMADol  (ULTRAM ) 50 MG tablet Take 1 tablet (50 mg total) by mouth every 6 (six) hours as needed. Give 2 tablets by mouth every 6 hours as needed for severe pain.   Zinc Oxide (TRIPLE PASTE) 12.8 % ointment Apply 1 Application topically. To peri area and skin folds topically twice daily   No facility-administered encounter medications on file as of 07/29/2024.    Review of Systems  Unable to perform ROS: Dementia    Immunization History  Administered Date(s) Administered   Influenza, High Dose Seasonal PF 11/23/2022   Influenza-Unspecified 10/10/2023   Moderna Covid-19 Vaccine Bivalent Booster 31yrs & up 12/19/2023   PFIZER(Purple Top)SARS-COV-2 Vaccination 01/07/2020, 01/27/2020, 11/18/2020   PNEUMOCOCCAL CONJUGATE-20 12/14/2023   Pertinent  Health Maintenance Due  Topic Date Due   INFLUENZA VACCINE  07/18/2024   DEXA SCAN  Discontinued      01/10/2024    1:26 PM  Fall Risk  Falls in the past year? 0  Was there an injury with Fall? 0  Fall Risk Category Calculator 0   Functional Status Survey:    Vitals:   07/29/24 1249  BP: 129/75  Pulse: 75  Resp: 18  Temp: 98 F (36.7 C)  SpO2: 95%  Weight: 172 lb 12.8 oz (78.4 kg)  Height: 5' 6 (1.676 m)   Body mass index is 27.89 kg/m. Physical Exam Constitutional:      General: She is not in acute distress.    Appearance: She is well-developed. She is not diaphoretic.  HENT:     Head: Normocephalic and atraumatic.     Mouth/Throat:     Pharynx: No oropharyngeal exudate.  Eyes:     Conjunctiva/sclera: Conjunctivae normal.     Pupils: Pupils are equal, round, and reactive to light.  Cardiovascular:     Rate and Rhythm: Normal rate and regular rhythm.     Heart sounds: Normal heart sounds.  Pulmonary:     Effort: Pulmonary effort is normal.      Breath sounds: Normal breath sounds.  Abdominal:     General: Bowel sounds are normal.     Palpations: Abdomen is soft.  Musculoskeletal:     Cervical back: Normal range of motion and neck supple.     Right lower leg: No edema.     Left lower leg: No edema.  Skin:    General: Skin is warm and dry.  Neurological:     Mental Status: She is alert.  Psychiatric:        Mood and Affect: Mood normal.     Labs  reviewed: Recent Labs    12/11/23 1601 12/12/23 0604 12/12/23 0605 12/13/23 0943 12/20/23 0000 05/08/24 0000 07/07/24 0000 07/10/24 0000  NA 136  --  137 136   < > 140 140 136*  K 3.2*  --  3.2* 3.6   < > 3.9 3.8 3.9  CL 98  --  99 101   < > 101 100 105  CO2 26  --  27 26   < > 32* 34* 23*  GLUCOSE 139*  --  116* 145*  --   --   --   --   BUN 14  --  15 12   < > 21 19 18   CREATININE 0.73  --  0.91 0.69   < > 0.7 0.7 0.7  CALCIUM  9.1  --  8.8* 8.9   < > 9.0 9.0 8.7  MG  --  2.0  --   --   --   --   --   --    < > = values in this interval not displayed.   Recent Labs    12/11/23 1601 12/20/23 0000 03/06/24 0000 04/18/24 0000  AST 22 15 31 27   ALT 10 8 20 16   ALKPHOS 74 110 81 74  BILITOT 1.0  --   --   --   PROT 7.1  --   --   --   ALBUMIN 3.4* 3.1* 2.6* 2.8*   Recent Labs    12/11/23 1601 12/12/23 0605 12/20/23 0000 04/18/24 0000 07/07/24 0000 07/10/24 0000  WBC 9.4 10.6*   < > 10.5 8.9 9.3  NEUTROABS 6.7  --    < > 6,983.00 4,948.00 5,896.00  HGB 14.2 13.7   < > 12.8 13.4 12.6  HCT 43.2 40.3   < > 38 42 39  MCV 92.9 89.6  --   --   --   --   PLT 347 342   < > 310 314 286   < > = values in this interval not displayed.   Lab Results  Component Value Date   TSH 4.35 08/27/2023   Lab Results  Component Value Date   HGBA1C 6.5 (H) 12/12/2023   Lab Results  Component Value Date   CHOL 185 12/12/2023   HDL 32 (L) 12/12/2023   LDLCALC 130 (H) 12/12/2023   TRIG 117 12/12/2023   CHOLHDL 5.8 12/12/2023    Significant Diagnostic Results in last  30 days:  No results found.  Assessment/Plan 1. Recurrent Clostridium difficile diarrhea (Primary) -another recurrence staff reports stool has been chronically soft/diarrhea but smell started to worsen. Completed several rounds of vanc without resolution of c diff and therefor was placed of dificiid and then tested negative for c diff.  Now testing positive again Consulted with pharm D, will restart dificid 200 mg PO for 10 days and get ID consults  - Ambulatory referral to Infectious Disease  2. Benzodiazepine dependence (HCC) Ongoing, needing refill.  - ALPRAZolam  (XANAX ) 1 MG tablet; Take 1 tablet (1 mg total) by mouth at bedtime.  Dispense: 30 tablet; Refill: 5    Jourdyn Ferrin K. Caro BODILY [facility] [phone]

## 2024-07-31 DIAGNOSIS — D649 Anemia, unspecified: Secondary | ICD-10-CM | POA: Diagnosis not present

## 2024-07-31 LAB — BASIC METABOLIC PANEL WITH GFR
BUN: 12 (ref 4–21)
CO2: 25 — AB (ref 13–22)
Chloride: 107 (ref 99–108)
Creatinine: 0.4 — AB (ref 0.5–1.1)
Glucose: 84
Potassium: 4.1 meq/L (ref 3.5–5.1)
Sodium: 138 (ref 137–147)

## 2024-07-31 LAB — CBC AND DIFFERENTIAL
HCT: 39 (ref 36–46)
Hemoglobin: 12.6 (ref 12.0–16.0)
Platelets: 311 K/uL (ref 150–400)
WBC: 9.4

## 2024-07-31 LAB — HEPATIC FUNCTION PANEL
ALT: 8 U/L (ref 7–35)
AST: 18 (ref 13–35)
Alkaline Phosphatase: 84 (ref 25–125)

## 2024-07-31 LAB — COMPREHENSIVE METABOLIC PANEL WITH GFR
Calcium: 8.9 (ref 8.7–10.7)
eGFR: 92

## 2024-07-31 LAB — CBC: RBC: 4.11 (ref 3.87–5.11)

## 2024-08-04 ENCOUNTER — Other Ambulatory Visit: Payer: Self-pay

## 2024-08-05 ENCOUNTER — Ambulatory Visit: Attending: Infectious Diseases | Admitting: Infectious Diseases

## 2024-08-05 ENCOUNTER — Encounter: Payer: Self-pay | Admitting: Infectious Diseases

## 2024-08-05 VITALS — BP 153/72 | HR 65 | Temp 98.3°F

## 2024-08-05 DIAGNOSIS — A0471 Enterocolitis due to Clostridium difficile, recurrent: Secondary | ICD-10-CM | POA: Insufficient documentation

## 2024-08-05 DIAGNOSIS — I129 Hypertensive chronic kidney disease with stage 1 through stage 4 chronic kidney disease, or unspecified chronic kidney disease: Secondary | ICD-10-CM | POA: Diagnosis not present

## 2024-08-05 DIAGNOSIS — Z79899 Other long term (current) drug therapy: Secondary | ICD-10-CM | POA: Diagnosis not present

## 2024-08-05 DIAGNOSIS — R159 Full incontinence of feces: Secondary | ICD-10-CM | POA: Diagnosis not present

## 2024-08-05 DIAGNOSIS — R1311 Dysphagia, oral phase: Secondary | ICD-10-CM | POA: Diagnosis not present

## 2024-08-05 DIAGNOSIS — N189 Chronic kidney disease, unspecified: Secondary | ICD-10-CM | POA: Diagnosis not present

## 2024-08-05 DIAGNOSIS — I2510 Atherosclerotic heart disease of native coronary artery without angina pectoris: Secondary | ICD-10-CM | POA: Insufficient documentation

## 2024-08-05 DIAGNOSIS — I69354 Hemiplegia and hemiparesis following cerebral infarction affecting left non-dominant side: Secondary | ICD-10-CM | POA: Diagnosis not present

## 2024-08-05 DIAGNOSIS — Z724 Inappropriate diet and eating habits: Secondary | ICD-10-CM | POA: Diagnosis not present

## 2024-08-05 DIAGNOSIS — A0472 Enterocolitis due to Clostridium difficile, not specified as recurrent: Secondary | ICD-10-CM | POA: Diagnosis not present

## 2024-08-05 DIAGNOSIS — F4323 Adjustment disorder with mixed anxiety and depressed mood: Secondary | ICD-10-CM | POA: Diagnosis not present

## 2024-08-05 NOTE — Patient Instructions (Addendum)
  During today's visit, we discussed your ongoing issue with chronic diarrhea, which has been present since your stroke in December 2024. We reviewed your symptoms, recent test results, and current medications. We also talked about potential dietary and medication-related causes for your symptoms.  YOUR PLAN:  -CHRONIC DIARRHEA WITH FECAL INCONTINENCE: Chronic diarrhea means having loose stools for an extended period, and fecal incontinence is the inability to control bowel movements. Your diarrhea has been ongoing since your stroke, and recent tests suggest you might have a colonization of C. difficile bacteria rather than an active infection  . We recommend stopping soda to reduce sugar intake, which may worsen your symptoms, and increasing water consumption. We also suggest dietary changes like adding plain Greek yogurt with fruits and nuts to improve gut health. We will ask your PCP to perform a two-part test for C. difficile to confirm the diagnosis and  not to check your urine routinely and give unnecessary antibiotics.    INSTRUCTIONS:  I spoke to Dr.Beamer and found oyut the 2 part test for antigen and toxin done in July 2025 was negative- so you dont have cdiff infection. Please make the dietary changes to see whether the diarrhea is controlled

## 2024-08-05 NOTE — Progress Notes (Signed)
 NAME: Tanya Ho  DOB: Apr 30, 1926  MRN: [id number]  Date/Time: 08/05/2024 10:00 AM   Subjective:  Referred by Dr. Richerd Brigham and her nurse practitioner ? Harlene An for C. difficile diarrhea  Patient is here with her son and daughter-in-law The family agreed/consented  to the use of AI scribe Patient is a limited historian secondary to her age and hearing loss and memory issues   Tanya Ho is a 89 y.o. female with a history of CVA, left hemiplegia presents with chronic diarrhea., left femur Fracture and ORIF in March 2024 Patient has been at [facility] since March 2024 after her fracture./surgery Chronic diarrhea has been ongoing since her stroke on Christmas Eve 2024, with loose stools occurring two to four times daily.  She has been treated for Clostridioides difficile (C. diff) infection multiple times, with the first treatment lasting three months. Despite treatment, she continues to experience loose stools. Recent tests have shown a positive PCR for C. diff antigen, but negative for the toxin and antigen  Her past medical history includes chronic kidney disease, atrial fibrillation, hypertension, and coronary artery disease. There is also mention of dysphagia, particularly in the oral phase.  Her current medications include losartan , Lasix , metoprolol , potassium as needed, rosuvastatin , sertraline, tramadol  as needed, Xanax , vitamin D , famotidine , and apixaban .  In terms of her diet, she consumes a limited amount of food, often having a piece of biscuit or cereal with coffee for breakfast, and a small sandwich or banana for lunch. She drinks a significant amount of regular Coke, which may contribute to her symptoms. She also consumes water and occasionally Pedialyte and Ensure.  Past Medical History:  Diagnosis Date   Back pain    Bruises easily    C. difficile diarrhea 02/14/2024   Coronary artery disease    NONOBSTRUCTIVE   Fatigue    Hyperlipidemia    Hypertension     Knee pain    OA (osteoarthritis)    Obesity    Palpitations    SOB (shortness of breath)     Past Surgical History:  Procedure Laterality Date   BREAST BIOPSY     LEFT BREAST   CARDIAC CATHETERIZATION  05/11/2006   OVERALL CARDIAC SIZE AND SILHOUETTE ARE NORMAL. EF ESTIMATED 60%   INTRAMEDULLARY (IM) NAIL INTERTROCHANTERIC Left 03/12/2023   Procedure: INTRAMEDULLARY (IM) NAIL INTERTROCHANTERIC;  Surgeon: Kendal Franky SQUIBB, MD;  Location: [facility] OR;  Service: Orthopedics;  Laterality: Left;   KIDNEY STONES  1982   KNEE ARTHROSCOPY  09/22/2008   VAGINAL HYSTERECTOMY      Social History   Socioeconomic History   Marital status: Widowed    Spouse name: Not on file   Number of children: Not on file   Years of education: Not on file   Highest education level: Not on file  Occupational History   Not on file  Tobacco Use   Smoking status: Never   Smokeless tobacco: Never  Vaping Use   Vaping status: Never Used  Substance and Sexual Activity   Alcohol  use: No   Drug use: No   Sexual activity: Not Currently  Other Topics Concern   Not on file  Social History Narrative   Not on file   Social Drivers of Health   Financial Resource Strain: Not on file  Food Insecurity: No Food Insecurity (12/12/2023)   Hunger Vital Sign    Worried About Running Out of Food in the Last Year: Never true    Ran Out  of Food in the Last Year: Never true  Transportation Needs: No Transportation Needs (12/12/2023)   PRAPARE - Administrator, Civil Service (Medical): No    Lack of Transportation (Non-Medical): No  Physical Activity: Not on file  Stress: Not on file  Social Connections: Not on file  Intimate Partner Violence: Not At Risk (12/12/2023)   Humiliation, Afraid, Rape, and Kick questionnaire    Fear of Current or Ex-Partner: No    Emotionally Abused: No    Physically Abused: No    Sexually Abused: No    Family History  Problem Relation Age of Onset   Heart disease Mother     Stroke Mother    Heart attack Father    Allergies  Allergen Reactions   Codeine Other (See Comments)    Unknown reaction   Demerol [Meperidine Hcl] Other (See Comments)    Unknown reaction   Morphine And Codeine Other (See Comments)    Unknown reaction   I? Current Outpatient Medications  Medication Sig Dispense Refill   ALPRAZolam  (XANAX ) 1 MG tablet Take 1 tablet (1 mg total) by mouth at bedtime. 30 tablet 5   apixaban  (ELIQUIS ) 5 MG TABS tablet Take 1 tablet (5 mg total) by mouth 2 (two) times daily. 180 tablet 1   benzocaine (HURRICAINE) 20 % GEL Use as directed 1 Application in the mouth or throat every 6 (six) hours as needed.     Cholecalciferol (VITAMIN D -3) 25 MCG (1000 UT) CAPS One by mouth daily.     estradiol (ESTRACE) 0.1 MG/GM vaginal cream Place 1 Applicatorful vaginally. At bedtime every Mon, Wed, Fri     famotidine  (PEPCID ) 20 MG tablet Take 20 mg by mouth at bedtime.     furosemide  (LASIX ) 40 MG tablet Take 40 mg by mouth 2 (two) times daily. Can give extra 40 mg tablet as needed for weight gain of 3 lbs overnight or 6 lbs in a week.     hydrocortisone (ANUSOL-HC) 25 MG suppository Place 25 mg rectally every 12 (twelve) hours as needed for hemorrhoids.     losartan  (COZAAR ) 50 MG tablet Take 25 mg by mouth daily.     metoprolol  tartrate (LOPRESSOR ) 50 MG tablet Take 50 mg by mouth 2 (two) times daily.     nitroGLYCERIN  (NITROSTAT ) 0.4 MG SL tablet Place 1 tablet (0.4 mg total) under the tongue every 5 (five) minutes as needed for chest pain. 25 tablet 3   potassium chloride  SA (KLOR-CON  M) 20 MEQ tablet Take 20 mEq by mouth 2 (two) times daily.     rosuvastatin  (CRESTOR ) 10 MG tablet Take 1 tablet (10 mg total) by mouth daily.     sertraline (ZOLOFT) 100 MG tablet Take 100 mg by mouth daily.     traMADol  (ULTRAM ) 50 MG tablet Take 1 tablet (50 mg total) by mouth every 6 (six) hours as needed. Give 2 tablets by mouth every 6 hours as needed for severe pain. 240 tablet 0    Zinc Oxide (TRIPLE PASTE) 12.8 % ointment Apply 1 Application topically. To peri area and skin folds topically twice daily     No current facility-administered medications for this visit.     Abtx:  Anti-infectives (From admission, onward)    None       REVIEW OF SYSTEMS:  Const: negative fever, negative chills, negative weight loss Eyes: negative diplopia or visual changes, negative eye pain ENT: negative coryza, negative sore throat Resp: negative cough, hemoptysis, dyspnea Cards:  negative for chest pain, palpitations, lower extremity edema GU: incontinence GI: Negative for abdominal pain, +diarrhea, incontinence Heme: negative for easy bruising and gum/nose bleeding MS: weakness- wheel chair bound Neurolo:left sided weakness Psych: negative for feelings of anxiety, depression  Endocrine: negative for thyroid , diabetes Allergy/Immunology- codeine, demerol and morphine: Objective:  VITALS:  BP (!) 153/72   Pulse 65   Temp 98.3 F (36.8 C) (Temporal)   SpO2 93%   PHYSICAL EXAM:  General: Alert, cooperative, no distress, hard of hearing- oriented in person, place, responds to questions appropriately when she hears the question in wheel chair Head: Normocephalic, without obvious abnormality, atraumatic. Eyes: Conjunctivae clear, anicteric sclerae. Pupils are equal ENT Nares normal. No drainage or sinus tenderness. Lips, mucosa, and tongue normal. No Thrush Neck: Supple, symmetrical, no adenopathy, thyroid : non tender no carotid bruit and no JVD. Lungs: Clear to auscultation bilaterally. No Wheezing or Rhonchi. No rales. Heart: Regular rate and rhythm, no murmur, rub or gallop. Abdomen: Soft,  Extremities: atraumatic, no cyanosis. No edema. No clubbing Skin: could not examine in detail Lymph: Cervical, supraclavicular normal. Neurologic: left hemiplegia Pertinent Labs  07/25/24 Stool PCR positive for cdiff   ? Impression/Recommendation ?Chronic diarrhea with  fecal incontinence   Chronic diarrhea and fecal incontinence have persisted since her stroke in December 2024, with diarrhea occurring 2-4 times daily. Stools are loose but not watery. I spoke to Dr.Beamer who is her PCP at [facility]-- PCR tests for C. difficile are positive, while toxin and antigen is tests are negative, indicating no infection.  Could be colonization  . High sugar intake from sodas  ( she consumes 2 X 12 ounce coke which has 80 grams of sugar) may worsen symptoms, and incontinence might be mistaken for diarrhea.  . Avoid unnecessary antibiotics to prevent exacerbation of colonization. Eliminate soda to reduce sugar intake and potential diarrhea exacerbation. Encourage water intake instead of sugary drinks. Consider dietary modifications, such as plain Greek yogurt with fruits and nuts, to improve gut health. Discussed with her primary care provider at [facility] If a repeat test shows antigen or toxin being positive then they can do a prolonged vanco course - tapering it over 6-12 week periods. ? ?  AFIB rate and rhythm controlled- on eliquis - metoprolol   HLD- on crestor    ________________________________________________ Discussed with patient, and family Discussed with Dr.Beamer Follow as needed

## 2024-08-08 DIAGNOSIS — E1159 Type 2 diabetes mellitus with other circulatory complications: Secondary | ICD-10-CM | POA: Diagnosis not present

## 2024-08-08 DIAGNOSIS — B351 Tinea unguium: Secondary | ICD-10-CM | POA: Diagnosis not present

## 2024-08-15 DIAGNOSIS — M6281 Muscle weakness (generalized): Secondary | ICD-10-CM | POA: Diagnosis not present

## 2024-08-15 DIAGNOSIS — I2510 Atherosclerotic heart disease of native coronary artery without angina pectoris: Secondary | ICD-10-CM | POA: Diagnosis not present

## 2024-08-15 DIAGNOSIS — I69398 Other sequelae of cerebral infarction: Secondary | ICD-10-CM | POA: Diagnosis not present

## 2024-08-15 DIAGNOSIS — I130 Hypertensive heart and chronic kidney disease with heart failure and stage 1 through stage 4 chronic kidney disease, or unspecified chronic kidney disease: Secondary | ICD-10-CM | POA: Diagnosis not present

## 2024-08-15 DIAGNOSIS — R278 Other lack of coordination: Secondary | ICD-10-CM | POA: Diagnosis not present

## 2024-08-15 DIAGNOSIS — Z9181 History of falling: Secondary | ICD-10-CM | POA: Diagnosis not present

## 2024-08-22 DIAGNOSIS — M6281 Muscle weakness (generalized): Secondary | ICD-10-CM | POA: Diagnosis not present

## 2024-08-22 DIAGNOSIS — I2510 Atherosclerotic heart disease of native coronary artery without angina pectoris: Secondary | ICD-10-CM | POA: Diagnosis not present

## 2024-08-22 DIAGNOSIS — I130 Hypertensive heart and chronic kidney disease with heart failure and stage 1 through stage 4 chronic kidney disease, or unspecified chronic kidney disease: Secondary | ICD-10-CM | POA: Diagnosis not present

## 2024-08-22 DIAGNOSIS — Z9181 History of falling: Secondary | ICD-10-CM | POA: Diagnosis not present

## 2024-08-22 DIAGNOSIS — I69398 Other sequelae of cerebral infarction: Secondary | ICD-10-CM | POA: Diagnosis not present

## 2024-08-22 DIAGNOSIS — R278 Other lack of coordination: Secondary | ICD-10-CM | POA: Diagnosis not present

## 2024-08-24 DIAGNOSIS — Z9181 History of falling: Secondary | ICD-10-CM | POA: Diagnosis not present

## 2024-08-24 DIAGNOSIS — M6281 Muscle weakness (generalized): Secondary | ICD-10-CM | POA: Diagnosis not present

## 2024-08-24 DIAGNOSIS — I69398 Other sequelae of cerebral infarction: Secondary | ICD-10-CM | POA: Diagnosis not present

## 2024-08-24 DIAGNOSIS — R278 Other lack of coordination: Secondary | ICD-10-CM | POA: Diagnosis not present

## 2024-08-24 DIAGNOSIS — I130 Hypertensive heart and chronic kidney disease with heart failure and stage 1 through stage 4 chronic kidney disease, or unspecified chronic kidney disease: Secondary | ICD-10-CM | POA: Diagnosis not present

## 2024-08-24 DIAGNOSIS — I2510 Atherosclerotic heart disease of native coronary artery without angina pectoris: Secondary | ICD-10-CM | POA: Diagnosis not present

## 2024-08-26 DIAGNOSIS — F4323 Adjustment disorder with mixed anxiety and depressed mood: Secondary | ICD-10-CM | POA: Diagnosis not present

## 2024-08-28 ENCOUNTER — Encounter: Payer: Self-pay | Admitting: Nurse Practitioner

## 2024-08-28 ENCOUNTER — Non-Acute Institutional Stay (SKILLED_NURSING_FACILITY): Payer: Self-pay | Admitting: Nurse Practitioner

## 2024-08-28 DIAGNOSIS — I5032 Chronic diastolic (congestive) heart failure: Secondary | ICD-10-CM

## 2024-08-28 DIAGNOSIS — F03C4 Unspecified dementia, severe, with anxiety: Secondary | ICD-10-CM | POA: Insufficient documentation

## 2024-08-28 DIAGNOSIS — F03B0 Unspecified dementia, moderate, without behavioral disturbance, psychotic disturbance, mood disturbance, and anxiety: Secondary | ICD-10-CM

## 2024-08-28 DIAGNOSIS — I4821 Permanent atrial fibrillation: Secondary | ICD-10-CM | POA: Diagnosis not present

## 2024-08-28 DIAGNOSIS — I10 Essential (primary) hypertension: Secondary | ICD-10-CM | POA: Diagnosis not present

## 2024-08-28 DIAGNOSIS — E785 Hyperlipidemia, unspecified: Secondary | ICD-10-CM

## 2024-08-28 DIAGNOSIS — I69354 Hemiplegia and hemiparesis following cerebral infarction affecting left non-dominant side: Secondary | ICD-10-CM | POA: Diagnosis not present

## 2024-08-28 DIAGNOSIS — A0471 Enterocolitis due to Clostridium difficile, recurrent: Secondary | ICD-10-CM

## 2024-08-28 DIAGNOSIS — I2510 Atherosclerotic heart disease of native coronary artery without angina pectoris: Secondary | ICD-10-CM

## 2024-08-28 DIAGNOSIS — F1320 Sedative, hypnotic or anxiolytic dependence, uncomplicated: Secondary | ICD-10-CM

## 2024-08-28 DIAGNOSIS — F331 Major depressive disorder, recurrent, moderate: Secondary | ICD-10-CM

## 2024-08-28 NOTE — Assessment & Plan Note (Signed)
 Colonized for c diff without active infection.  Continues on probiotic and fiber supplement

## 2024-08-28 NOTE — Assessment & Plan Note (Signed)
 Blood pressure well controlled, goal bp <140/90 Continue current medications and dietary modifications follow metabolic panel

## 2024-08-28 NOTE — Assessment & Plan Note (Signed)
 Stable, continues on metoprolol  and  hydralazine .  No chest pains noted.

## 2024-08-28 NOTE — Assessment & Plan Note (Signed)
 Progressive decline in cognitive and functional status without acute change. Continue supportive care.

## 2024-08-28 NOTE — Assessment & Plan Note (Signed)
 Continues on crestor . Monitor lipids annually.

## 2024-08-28 NOTE — Assessment & Plan Note (Signed)
 Ongoing, unable to tolerate wean. No worsening in anxiety noted. Continue current regimen.

## 2024-08-28 NOTE — Assessment & Plan Note (Signed)
 Euvolemic, continues on lasix twice daily and metoprolol

## 2024-08-28 NOTE — Progress Notes (Signed)
 Location:  Other [place].  Nursing Home Room Number: [facility] [id number] Place of Service:  SNF [id number]) Harlene An, NP  PCP: Abdul Fine, MD  Patient Care Team: Abdul Fine, MD as PCP - General (Family Medicine) Hobart Powell BRAVO, MD (Inactive) as PCP - Cardiology (Cardiology) Inocencio Soyla Lunger, MD as PCP - Electrophysiology (Cardiology)  Extended Emergency Contact Information Primary Emergency Contact: Alkire,Danny Mobile Phone: [phone] Relation: Son Secondary Emergency Contact: Gordon,NORMA  [place] Home Phone: [phone] Relation: Daughter  Goals of care: Advanced Directive information    07/02/2024   10:02 AM  Advanced Directives  Does Patient Have a Medical Advance Directive? Yes  Type of Advance Directive Out of facility DNR (pink MOST or yellow form)  Does patient want to make changes to medical advance directive? No - Patient declined     Chief Complaint  Patient presents with   Medical Management of Chronic Issues    Medical Management of Chronic Issues.     HPI:  Tanya Ho is a 89 y.o. female seen today for medical management of chronic disease.  Tanya Ho with hx of diarrhea, recurrent c diff, CHF, dementia, OA, HTN.  She has follow up with outside derm due to nonhealing area on forearm Staff reports increase in confusion and more weakness.  She has poor appetite but eating more with recent diet change to moist and bite size.  Tanya Ho denies pain at this time.  Continues to have diarrhea but more formed at times on fiber.  Denies worsening anxiety or depression.  No pain with urination.  Staff notes toenail came off- no pain noted.     Past Medical History:  Diagnosis Date   Back pain    Bruises easily    C. difficile diarrhea 02/14/2024   Coronary artery disease    NONOBSTRUCTIVE   Fatigue    Hyperlipidemia    Hypertension    Knee pain    OA (osteoarthritis)    Obesity    Palpitations    SOB (shortness of breath)     Past Surgical History:  Procedure Laterality Date   BREAST BIOPSY     LEFT BREAST   CARDIAC CATHETERIZATION  05/11/2006   OVERALL CARDIAC SIZE AND SILHOUETTE ARE NORMAL. EF ESTIMATED 60%   INTRAMEDULLARY (IM) NAIL INTERTROCHANTERIC Left 03/12/2023   Procedure: INTRAMEDULLARY (IM) NAIL INTERTROCHANTERIC;  Surgeon: Kendal Franky SQUIBB, MD;  Location: [facility] OR;  Service: Orthopedics;  Laterality: Left;   KIDNEY STONES  1982   KNEE ARTHROSCOPY  09/22/2008   VAGINAL HYSTERECTOMY      Allergies  Allergen Reactions   Codeine Other (See Comments)    Unknown reaction   Demerol [Meperidine Hcl] Other (See Comments)    Unknown reaction   Morphine And Codeine Other (See Comments)    Unknown reaction    Outpatient Encounter Medications as of 08/28/2024  Medication Sig   ALPRAZolam  (XANAX ) 1 MG tablet Take 1 tablet (1 mg total) by mouth at bedtime.   apixaban  (ELIQUIS ) 5 MG TABS tablet Take 1 tablet (5 mg total) by mouth 2 (two) times daily.   benzocaine (HURRICAINE) 20 % GEL Use as directed 1 Application in the mouth or throat every 6 (six) hours as needed.   Cholecalciferol (VITAMIN D -3) 25 MCG (1000 UT) CAPS One by mouth daily.   estradiol (ESTRACE) 0.1 MG/GM vaginal cream Place 1 Applicatorful vaginally. At bedtime every Mon, Wed, Fri   famotidine  (PEPCID ) 20 MG tablet Take 20 mg by mouth at bedtime.  furosemide  (LASIX ) 40 MG tablet Take 40 mg by mouth 2 (two) times daily. Can give extra 40 mg tablet as needed for weight gain of 3 lbs overnight or 6 lbs in a week. (Patient taking differently: Can give extra 40 mg tablet as needed for weight gain of 3 lbs overnight or 6 lbs in a week.)   hydrocortisone (ANUSOL-HC) 25 MG suppository Place 25 mg rectally every 12 (twelve) hours as needed for hemorrhoids.   losartan  (COZAAR ) 50 MG tablet Take 25 mg by mouth daily.   metoprolol  tartrate (LOPRESSOR ) 50 MG tablet Take 50 mg by mouth 2 (two) times daily.   nitroGLYCERIN  (NITROSTAT ) 0.4 MG SL tablet Place  1 tablet (0.4 mg total) under the tongue every 5 (five) minutes as needed for chest pain.   potassium chloride  SA (KLOR-CON  M) 20 MEQ tablet Take 20 mEq by mouth 2 (two) times daily.   rosuvastatin  (CRESTOR ) 10 MG tablet Take 1 tablet (10 mg total) by mouth daily.   saccharomyces boulardii (FLORASTOR) 250 MG capsule Take 250 mg by mouth 2 (two) times daily.   sertraline (ZOLOFT) 100 MG tablet Take 100 mg by mouth daily.   traMADol  (ULTRAM ) 50 MG tablet Take 1 tablet (50 mg total) by mouth every 6 (six) hours as needed. Give 2 tablets by mouth every 6 hours as needed for severe pain.   Wheat Dextrin (BENEFIBER) POWD Take 1 each by mouth daily.   Zinc Oxide (TRIPLE PASTE) 12.8 % ointment Apply 1 Application topically. To peri area and skin folds topically twice daily   No facility-administered encounter medications on file as of 08/28/2024.    Review of Systems  Constitutional:  Negative for chills and fever.  HENT:  Negative for tinnitus.   Respiratory:  Negative for cough and shortness of breath.   Cardiovascular:  Negative for chest pain, palpitations and leg swelling.  Gastrointestinal:  Negative for abdominal pain, constipation and diarrhea.  Genitourinary:  Negative for dysuria, frequency and urgency.  Musculoskeletal:  Negative for back pain and myalgias.  Skin: Negative.   Neurological:  Positive for weakness. Negative for dizziness and headaches.  Psychiatric/Behavioral:  Positive for confusion.      Immunization History  Administered Date(s) Administered   INFLUENZA, HIGH DOSE SEASONAL PF 11/23/2022   Influenza-Unspecified 10/10/2023   Moderna Covid-19 Vaccine Bivalent Booster 52yrs & up 12/19/2023   PFIZER(Purple Top)SARS-COV-2 Vaccination 01/07/2020, 01/27/2020, 11/18/2020   PNEUMOCOCCAL CONJUGATE-20 12/14/2023   Pertinent  Health Maintenance Due  Topic Date Due   Influenza Vaccine  07/18/2024   DEXA SCAN  Discontinued      01/10/2024    1:26 PM  Fall Risk  Falls in  the past year? 0  Was there an injury with Fall? 0  Fall Risk Category Calculator 0   Functional Status Survey:    Vitals:   08/28/24 1007 08/28/24 1030  BP: (!) 166/77 125/75  Pulse: 62   Resp: 17   Temp: 97.6 F (36.4 C)   SpO2: 95%   Weight: 181 lb 12.8 oz (82.5 kg)   Height: 5' 6 (1.676 m)    Body mass index is 29.34 kg/m. Physical Exam Constitutional:      General: She is not in acute distress.    Appearance: She is well-developed. She is not diaphoretic.  HENT:     Head: Normocephalic and atraumatic.     Mouth/Throat:     Pharynx: No oropharyngeal exudate.  Eyes:     Conjunctiva/sclera: Conjunctivae normal.  Pupils: Pupils are equal, round, and reactive to light.  Cardiovascular:     Rate and Rhythm: Normal rate and regular rhythm.     Heart sounds: Normal heart sounds.  Pulmonary:     Effort: Pulmonary effort is normal.     Breath sounds: Normal breath sounds.  Abdominal:     General: Bowel sounds are normal.     Palpations: Abdomen is soft.  Musculoskeletal:     Cervical back: Normal range of motion and neck supple.     Right lower leg: No edema.     Left lower leg: No edema.  Skin:    General: Skin is warm and dry.  Neurological:     Mental Status: She is alert.  Psychiatric:        Mood and Affect: Mood normal.     Labs reviewed: Recent Labs    12/11/23 1601 12/12/23 0604 12/12/23 0605 12/13/23 0943 12/20/23 0000 07/07/24 0000 07/10/24 0000 07/31/24 1459  NA 136  --  137 136   < > 140 136* 138  K 3.2*  --  3.2* 3.6   < > 3.8 3.9 4.1  CL 98  --  99 101   < > 100 105 107  CO2 26  --  27 26   < > 34* 23* 25*  GLUCOSE 139*  --  116* 145*  --   --   --   --   BUN 14  --  15 12   < > 19 18 12   CREATININE 0.73  --  0.91 0.69   < > 0.7 0.7 0.4*  CALCIUM  9.1  --  8.8* 8.9   < > 9.0 8.7 8.9  MG  --  2.0  --   --   --   --   --   --    < > = values in this interval not displayed.   Recent Labs    12/11/23 1601 12/20/23 0000  03/06/24 0000 04/18/24 0000 07/31/24 1459  AST 22 15 31 27 18   ALT 10 8 20 16 8   ALKPHOS 74 110 81 74 84  BILITOT 1.0  --   --   --   --   PROT 7.1  --   --   --   --   ALBUMIN 3.4* 3.1* 2.6* 2.8*  --    Recent Labs    12/11/23 1601 12/12/23 0605 12/20/23 0000 04/18/24 0000 07/07/24 0000 07/10/24 0000 07/31/24 1459  WBC 9.4 10.6*   < > 10.5 8.9 9.3 9.4  NEUTROABS 6.7  --    < > 6,983.00 4,948.00 5,896.00  --   HGB 14.2 13.7   < > 12.8 13.4 12.6 12.6  HCT 43.2 40.3   < > 38 42 39 39  MCV 92.9 89.6  --   --   --   --   --   PLT 347 342   < > 310 314 286 311   < > = values in this interval not displayed.   Lab Results  Component Value Date   TSH 4.35 08/27/2023   Lab Results  Component Value Date   HGBA1C 6.5 (H) 12/12/2023   Lab Results  Component Value Date   CHOL 185 12/12/2023   HDL 32 (L) 12/12/2023   LDLCALC 130 (H) 12/12/2023   TRIG 117 12/12/2023   CHOLHDL 5.8 12/12/2023    Significant Diagnostic Results in last 30 days:  No results found.  Assessment/Plan Benzodiazepine dependence (  HCC) Ongoing, unable to tolerate wean. No worsening in anxiety noted. Continue current regimen.   Chronic heart failure with preserved ejection fraction (HFpEF) (HCC) Euvolemic, continues on lasix  twice daily and metoprolol    Coronary artery disease Stable, continues on metoprolol  and  hydralazine .  No chest pains noted.   Hemiparesis of left nondominant side as late effect of cerebral infarction (HCC) Unchanged, continue with supportive care. continues on eliquis , crestor  and antihypertensive regimen  Hypertension Blood pressure well controlled, goal bp <140/90 Continue current medications and dietary modifications follow metabolic panel.   Moderate dementia (HCC) Progressive decline in cognitive and functional status without acute change. Continue supportive care.   Moderate episode of recurrent major depressive disorder (HCC) Stable on current dose of zoloft  100 mg daily, continue at this time.    Permanent atrial fibrillation (HCC) Rate controlled on lopressor  50 mg BID with eliquis  BID for anticoaguation   Recurrent Clostridium difficile diarrhea Colonized for c diff without active infection.  Continues on probiotic and fiber supplement   Hyperlipidemia Continues on crestor . Monitor lipids annually.      Airanna Partin K. Caro BODILY [facility] [phone]

## 2024-08-28 NOTE — Assessment & Plan Note (Signed)
 Rate controlled on lopressor  50 mg BID with eliquis  BID for anticoaguation

## 2024-08-28 NOTE — Assessment & Plan Note (Signed)
 Stable on current dose of zoloft 100 mg daily, continue at this time.

## 2024-08-28 NOTE — Assessment & Plan Note (Signed)
 Unchanged, continue with supportive care. continues on eliquis , crestor  and antihypertensive regimen

## 2024-09-01 DIAGNOSIS — L814 Other melanin hyperpigmentation: Secondary | ICD-10-CM | POA: Diagnosis not present

## 2024-09-01 DIAGNOSIS — L821 Other seborrheic keratosis: Secondary | ICD-10-CM | POA: Diagnosis not present

## 2024-09-01 DIAGNOSIS — C44629 Squamous cell carcinoma of skin of left upper limb, including shoulder: Secondary | ICD-10-CM | POA: Diagnosis not present

## 2024-09-02 DIAGNOSIS — Z9181 History of falling: Secondary | ICD-10-CM | POA: Diagnosis not present

## 2024-09-02 DIAGNOSIS — F4323 Adjustment disorder with mixed anxiety and depressed mood: Secondary | ICD-10-CM | POA: Diagnosis not present

## 2024-09-02 DIAGNOSIS — R278 Other lack of coordination: Secondary | ICD-10-CM | POA: Diagnosis not present

## 2024-09-02 DIAGNOSIS — M6281 Muscle weakness (generalized): Secondary | ICD-10-CM | POA: Diagnosis not present

## 2024-09-02 DIAGNOSIS — I2510 Atherosclerotic heart disease of native coronary artery without angina pectoris: Secondary | ICD-10-CM | POA: Diagnosis not present

## 2024-09-02 DIAGNOSIS — I69398 Other sequelae of cerebral infarction: Secondary | ICD-10-CM | POA: Diagnosis not present

## 2024-09-02 DIAGNOSIS — I130 Hypertensive heart and chronic kidney disease with heart failure and stage 1 through stage 4 chronic kidney disease, or unspecified chronic kidney disease: Secondary | ICD-10-CM | POA: Diagnosis not present

## 2024-09-05 DIAGNOSIS — I69398 Other sequelae of cerebral infarction: Secondary | ICD-10-CM | POA: Diagnosis not present

## 2024-09-05 DIAGNOSIS — M6281 Muscle weakness (generalized): Secondary | ICD-10-CM | POA: Diagnosis not present

## 2024-09-05 DIAGNOSIS — R278 Other lack of coordination: Secondary | ICD-10-CM | POA: Diagnosis not present

## 2024-09-05 DIAGNOSIS — Z9181 History of falling: Secondary | ICD-10-CM | POA: Diagnosis not present

## 2024-09-05 DIAGNOSIS — I2510 Atherosclerotic heart disease of native coronary artery without angina pectoris: Secondary | ICD-10-CM | POA: Diagnosis not present

## 2024-09-05 DIAGNOSIS — I130 Hypertensive heart and chronic kidney disease with heart failure and stage 1 through stage 4 chronic kidney disease, or unspecified chronic kidney disease: Secondary | ICD-10-CM | POA: Diagnosis not present

## 2024-09-10 DIAGNOSIS — F4323 Adjustment disorder with mixed anxiety and depressed mood: Secondary | ICD-10-CM | POA: Diagnosis not present

## 2024-09-17 DIAGNOSIS — F4323 Adjustment disorder with mixed anxiety and depressed mood: Secondary | ICD-10-CM | POA: Diagnosis not present

## 2024-09-23 DIAGNOSIS — F4323 Adjustment disorder with mixed anxiety and depressed mood: Secondary | ICD-10-CM | POA: Diagnosis not present

## 2024-09-29 ENCOUNTER — Non-Acute Institutional Stay (SKILLED_NURSING_FACILITY): Payer: Self-pay | Admitting: Adult Health

## 2024-09-29 ENCOUNTER — Encounter: Payer: Self-pay | Admitting: Adult Health

## 2024-09-29 DIAGNOSIS — I10 Essential (primary) hypertension: Secondary | ICD-10-CM | POA: Diagnosis not present

## 2024-09-29 DIAGNOSIS — I4821 Permanent atrial fibrillation: Secondary | ICD-10-CM

## 2024-09-29 DIAGNOSIS — F03C4 Unspecified dementia, severe, with anxiety: Secondary | ICD-10-CM

## 2024-09-29 NOTE — Progress Notes (Unsigned)
 Location:  Other Nursing Home Room Number: [facility] 419-A Place of Service:  SNF (31) Provider:  Medina-Vargas, Kerby Hockley, DNP, FNP-BC  Patient Care Team: Abdul Fine, MD as PCP - General (Family Medicine) Hobart Powell BRAVO, MD (Inactive) as PCP - Cardiology (Cardiology) Inocencio Soyla Lunger, MD as PCP - Electrophysiology (Cardiology)  Extended Emergency Contact Information Primary Emergency Contact: Curless,Danny Mobile Phone: [phone] Relation: Son Secondary Emergency Contact: Gordon,NORMA  [place] Home Phone: [phone] Relation: Daughter  Code Status:  DNR  Goals of care: Advanced Directive information    09/29/2024   12:23 PM  Advanced Directives  Does Patient Have a Medical Advance Directive? Yes  Type of Advance Directive Out of facility DNR (pink MOST or yellow form)  Does patient want to make changes to medical advance directive? No - Patient declined  Pre-existing out of facility DNR order (yellow form or pink MOST form) Yellow form placed in chart (order not valid for inpatient use)     Chief Complaint  Patient presents with   Hypertension     elevated BP acute visit TL [place]    HPI:  Pt is a 89 y.o. female seen today for medical management of chronic diseases.  ***   Past Medical History:  Diagnosis Date   Back pain    Bruises easily    C. difficile diarrhea 02/14/2024   Coronary artery disease    NONOBSTRUCTIVE   Fatigue    Hyperlipidemia    Hypertension    Knee pain    OA (osteoarthritis)    Obesity    Palpitations    SOB (shortness of breath)    Past Surgical History:  Procedure Laterality Date   BREAST BIOPSY     LEFT BREAST   CARDIAC CATHETERIZATION  05/11/2006   OVERALL CARDIAC SIZE AND SILHOUETTE ARE NORMAL. EF ESTIMATED 60%   INTRAMEDULLARY (IM) NAIL INTERTROCHANTERIC Left 03/12/2023   Procedure: INTRAMEDULLARY (IM) NAIL INTERTROCHANTERIC;  Surgeon: Kendal Franky SQUIBB, MD;  Location: [facility] OR;  Service:  Orthopedics;  Laterality: Left;   KIDNEY STONES  1982   KNEE ARTHROSCOPY  09/22/2008   VAGINAL HYSTERECTOMY      Allergies  Allergen Reactions   Codeine Other (See Comments)    Unknown reaction   Demerol [Meperidine Hcl] Other (See Comments)    Unknown reaction   Morphine And Codeine Other (See Comments)    Unknown reaction    Outpatient Encounter Medications as of 09/29/2024  Medication Sig   ALPRAZolam  (XANAX ) 1 MG tablet Take 1 tablet (1 mg total) by mouth at bedtime.   apixaban  (ELIQUIS ) 5 MG TABS tablet Take 1 tablet (5 mg total) by mouth 2 (two) times daily.   benzocaine (HURRICAINE) 20 % GEL Use as directed 1 Application in the mouth or throat every 6 (six) hours as needed.   Cholecalciferol (VITAMIN D -3) 25 MCG (1000 UT) CAPS One by mouth daily.   Emollient (AQUAPHOR EX) Apply 1 Application topically as directed. Apply to R great toe wound topically every day and evening shift for wound care Clean area with NS or generic wound cleaner and pat dry. Apply aquaphor to nail bed and cover with bandaid. Change twice daily until resolve   estradiol (ESTRACE) 0.1 MG/GM vaginal cream Place 1 Applicatorful vaginally. At bedtime every Mon, Wed, Fri   famotidine  (PEPCID ) 20 MG tablet Take 20 mg by mouth at bedtime.   furosemide  (LASIX ) 40 MG tablet Take 40 mg by mouth 2 (two) times daily. Can give  extra 40 mg tablet as needed for weight gain of 3 lbs overnight or 6 lbs in a week. (Patient taking differently: Can give extra 40 mg tablet as needed for weight gain of 3 lbs overnight or 6 lbs in a week.)   hydrocortisone (ANUSOL-HC) 25 MG suppository Place 25 mg rectally every 12 (twelve) hours as needed for hemorrhoids.   losartan  (COZAAR ) 25 MG tablet Take 25 mg by mouth daily.   metoprolol  tartrate (LOPRESSOR ) 50 MG tablet Take 50 mg by mouth 2 (two) times daily.   nitroGLYCERIN  (NITROSTAT ) 0.4 MG SL tablet Place 1 tablet (0.4 mg total) under the tongue every 5 (five) minutes as needed for  chest pain.   Nutritional Supplements (ENSURE CLEAR) LIQD Take by mouth daily.   Nutritional Supplements (PROSOURCE PLUS PO) Take 8 oz by mouth 2 (two) times daily. two times a day for wound healing Mix with 8 oz drink of choice   potassium chloride  SA (KLOR-CON  M) 20 MEQ tablet Take 20 mEq by mouth 2 (two) times daily.   povidone-iodine  (BETADINE  SWABSTICKS) 10 % swab Apply 1 Application topically as directed. Apply to L heel DTI topically every day shift for wound care   rosuvastatin  (CRESTOR ) 10 MG tablet Take 1 tablet (10 mg total) by mouth daily.   saccharomyces boulardii (FLORASTOR) 250 MG capsule Take 250 mg by mouth 2 (two) times daily.   sertraline (ZOLOFT) 100 MG tablet Take 100 mg by mouth daily.   traMADol  (ULTRAM ) 50 MG tablet Take 1 tablet (50 mg total) by mouth every 6 (six) hours as needed. Give 2 tablets by mouth every 6 hours as needed for severe pain.   Wheat Dextrin (BENEFIBER) POWD Take 1 each by mouth daily.   Zinc Oxide (TRIPLE PASTE) 12.8 % ointment Apply 1 Application topically. To peri area and skin folds topically twice daily   [DISCONTINUED] losartan  (COZAAR ) 50 MG tablet Take 25 mg by mouth daily.   No facility-administered encounter medications on file as of 09/29/2024.    Review of Systems ***    Immunization History  Administered Date(s) Administered   INFLUENZA, HIGH DOSE SEASONAL PF 11/23/2022   Influenza-Unspecified 10/10/2023   Moderna Covid-19 Vaccine Bivalent Booster 88yrs & up 12/19/2023   PFIZER(Purple Top)SARS-COV-2 Vaccination 01/07/2020, 01/27/2020, 11/18/2020   PNEUMOCOCCAL CONJUGATE-20 12/14/2023   Pertinent  Health Maintenance Due  Topic Date Due   Influenza Vaccine  07/18/2024   DEXA SCAN  Discontinued      01/10/2024    1:26 PM  Fall Risk  Falls in the past year? 0  Was there an injury with Fall? 0  Fall Risk Category Calculator 0     Vitals:   09/29/24 1158  BP: (!) 206/88  Pulse: 72  Resp: (!) 22  Temp: (!) 97.5 F (36.4  C)  SpO2: 94%  Weight: 163 lb (73.9 kg)  Height: 5' 6 (1.676 m)   Body mass index is 26.31 kg/m.  Physical Exam     Labs reviewed: Recent Labs    12/11/23 1601 12/12/23 0604 12/12/23 0605 12/13/23 0943 12/20/23 0000 07/07/24 0000 07/10/24 0000 07/31/24 1459  NA 136  --  137 136   < > 140 136* 138  K 3.2*  --  3.2* 3.6   < > 3.8 3.9 4.1  CL 98  --  99 101   < > 100 105 107  CO2 26  --  27 26   < > 34* 23* 25*  GLUCOSE 139*  --  116* 145*  --   --   --   --   BUN 14  --  15 12   < > 19 18 12   CREATININE 0.73  --  0.91 0.69   < > 0.7 0.7 0.4*  CALCIUM  9.1  --  8.8* 8.9   < > 9.0 8.7 8.9  MG  --  2.0  --   --   --   --   --   --    < > = values in this interval not displayed.   Recent Labs    12/11/23 1601 12/20/23 0000 03/06/24 0000 04/18/24 0000 07/31/24 1459  AST 22 15 31 27 18   ALT 10 8 20 16 8   ALKPHOS 74 110 81 74 84  BILITOT 1.0  --   --   --   --   PROT 7.1  --   --   --   --   ALBUMIN 3.4* 3.1* 2.6* 2.8*  --    Recent Labs    12/11/23 1601 12/12/23 0605 12/20/23 0000 04/18/24 0000 07/07/24 0000 07/10/24 0000 07/31/24 1459  WBC 9.4 10.6*   < > 10.5 8.9 9.3 9.4  NEUTROABS 6.7  --    < > 6,983.00 4,948.00 5,896.00  --   HGB 14.2 13.7   < > 12.8 13.4 12.6 12.6  HCT 43.2 40.3   < > 38 42 39 39  MCV 92.9 89.6  --   --   --   --   --   PLT 347 342   < > 310 314 286 311   < > = values in this interval not displayed.   Lab Results  Component Value Date   TSH 4.35 08/27/2023   Lab Results  Component Value Date   HGBA1C 6.5 (H) 12/12/2023   Lab Results  Component Value Date   CHOL 185 12/12/2023   HDL 32 (L) 12/12/2023   LDLCALC 130 (H) 12/12/2023   TRIG 117 12/12/2023   CHOLHDL 5.8 12/12/2023    Significant Diagnostic Results in last 30 days:  No results found.  Assessment/Plan ***   Family/ staff Communication: Discussed plan of care with resident and charge nurse  Labs/tests ordered:     Lucy Boardman Medina-Vargas, DNP, MSN,  FNP-BC [facility] [phone] (Monday-Friday 8:00 a.m. - 5:00 p.m.) [phone] (after hours)

## 2024-10-01 ENCOUNTER — Encounter: Admitting: Dermatology

## 2024-10-01 DIAGNOSIS — F4323 Adjustment disorder with mixed anxiety and depressed mood: Secondary | ICD-10-CM | POA: Diagnosis not present

## 2024-10-08 DIAGNOSIS — E785 Hyperlipidemia, unspecified: Secondary | ICD-10-CM | POA: Diagnosis not present

## 2024-10-08 DIAGNOSIS — F32A Depression, unspecified: Secondary | ICD-10-CM | POA: Diagnosis not present

## 2024-10-08 DIAGNOSIS — I10 Essential (primary) hypertension: Secondary | ICD-10-CM | POA: Diagnosis not present

## 2024-10-08 DIAGNOSIS — L89626 Pressure-induced deep tissue damage of left heel: Secondary | ICD-10-CM | POA: Diagnosis not present

## 2024-10-08 DIAGNOSIS — F419 Anxiety disorder, unspecified: Secondary | ICD-10-CM | POA: Diagnosis not present

## 2024-10-08 DIAGNOSIS — I5032 Chronic diastolic (congestive) heart failure: Secondary | ICD-10-CM | POA: Diagnosis not present

## 2024-10-08 DIAGNOSIS — I4891 Unspecified atrial fibrillation: Secondary | ICD-10-CM | POA: Diagnosis not present

## 2024-10-08 DIAGNOSIS — R634 Abnormal weight loss: Secondary | ICD-10-CM | POA: Diagnosis not present

## 2024-10-08 DIAGNOSIS — F321 Major depressive disorder, single episode, moderate: Secondary | ICD-10-CM | POA: Diagnosis not present

## 2024-10-08 DIAGNOSIS — F01518 Vascular dementia, unspecified severity, with other behavioral disturbance: Secondary | ICD-10-CM | POA: Diagnosis not present

## 2024-10-08 DIAGNOSIS — I679 Cerebrovascular disease, unspecified: Secondary | ICD-10-CM | POA: Diagnosis not present

## 2024-10-08 DIAGNOSIS — E119 Type 2 diabetes mellitus without complications: Secondary | ICD-10-CM | POA: Diagnosis not present

## 2024-10-08 DIAGNOSIS — N1831 Chronic kidney disease, stage 3a: Secondary | ICD-10-CM | POA: Diagnosis not present

## 2024-10-09 DIAGNOSIS — I5032 Chronic diastolic (congestive) heart failure: Secondary | ICD-10-CM | POA: Diagnosis not present

## 2024-10-09 DIAGNOSIS — N1831 Chronic kidney disease, stage 3a: Secondary | ICD-10-CM | POA: Diagnosis not present

## 2024-10-09 DIAGNOSIS — E785 Hyperlipidemia, unspecified: Secondary | ICD-10-CM | POA: Diagnosis not present

## 2024-10-09 DIAGNOSIS — I4891 Unspecified atrial fibrillation: Secondary | ICD-10-CM | POA: Diagnosis not present

## 2024-10-09 DIAGNOSIS — I10 Essential (primary) hypertension: Secondary | ICD-10-CM | POA: Diagnosis not present

## 2024-10-09 DIAGNOSIS — I679 Cerebrovascular disease, unspecified: Secondary | ICD-10-CM | POA: Diagnosis not present

## 2024-10-10 DIAGNOSIS — I679 Cerebrovascular disease, unspecified: Secondary | ICD-10-CM | POA: Diagnosis not present

## 2024-10-10 DIAGNOSIS — Z23 Encounter for immunization: Secondary | ICD-10-CM | POA: Diagnosis not present

## 2024-10-10 DIAGNOSIS — I4891 Unspecified atrial fibrillation: Secondary | ICD-10-CM | POA: Diagnosis not present

## 2024-10-10 DIAGNOSIS — I10 Essential (primary) hypertension: Secondary | ICD-10-CM | POA: Diagnosis not present

## 2024-10-10 DIAGNOSIS — I5032 Chronic diastolic (congestive) heart failure: Secondary | ICD-10-CM | POA: Diagnosis not present

## 2024-10-10 DIAGNOSIS — E785 Hyperlipidemia, unspecified: Secondary | ICD-10-CM | POA: Diagnosis not present

## 2024-10-10 DIAGNOSIS — N1831 Chronic kidney disease, stage 3a: Secondary | ICD-10-CM | POA: Diagnosis not present

## 2024-10-14 DIAGNOSIS — F321 Major depressive disorder, single episode, moderate: Secondary | ICD-10-CM | POA: Diagnosis not present

## 2024-10-14 DIAGNOSIS — I4891 Unspecified atrial fibrillation: Secondary | ICD-10-CM | POA: Diagnosis not present

## 2024-10-14 DIAGNOSIS — I679 Cerebrovascular disease, unspecified: Secondary | ICD-10-CM | POA: Diagnosis not present

## 2024-10-14 DIAGNOSIS — I10 Essential (primary) hypertension: Secondary | ICD-10-CM | POA: Diagnosis not present

## 2024-10-14 DIAGNOSIS — E785 Hyperlipidemia, unspecified: Secondary | ICD-10-CM | POA: Diagnosis not present

## 2024-10-14 DIAGNOSIS — I5032 Chronic diastolic (congestive) heart failure: Secondary | ICD-10-CM | POA: Diagnosis not present

## 2024-10-14 DIAGNOSIS — N1831 Chronic kidney disease, stage 3a: Secondary | ICD-10-CM | POA: Diagnosis not present

## 2024-10-16 ENCOUNTER — Encounter: Payer: Self-pay | Admitting: Internal Medicine

## 2024-10-16 ENCOUNTER — Non-Acute Institutional Stay (SKILLED_NURSING_FACILITY): Payer: Self-pay | Admitting: Internal Medicine

## 2024-10-16 DIAGNOSIS — N1831 Chronic kidney disease, stage 3a: Secondary | ICD-10-CM | POA: Diagnosis not present

## 2024-10-16 DIAGNOSIS — Z515 Encounter for palliative care: Secondary | ICD-10-CM | POA: Diagnosis not present

## 2024-10-16 DIAGNOSIS — I4821 Permanent atrial fibrillation: Secondary | ICD-10-CM | POA: Diagnosis not present

## 2024-10-16 DIAGNOSIS — I10 Essential (primary) hypertension: Secondary | ICD-10-CM | POA: Diagnosis not present

## 2024-10-16 DIAGNOSIS — R600 Localized edema: Secondary | ICD-10-CM

## 2024-10-16 DIAGNOSIS — I5032 Chronic diastolic (congestive) heart failure: Secondary | ICD-10-CM | POA: Diagnosis not present

## 2024-10-16 DIAGNOSIS — E785 Hyperlipidemia, unspecified: Secondary | ICD-10-CM | POA: Diagnosis not present

## 2024-10-16 DIAGNOSIS — I4891 Unspecified atrial fibrillation: Secondary | ICD-10-CM | POA: Diagnosis not present

## 2024-10-16 DIAGNOSIS — I679 Cerebrovascular disease, unspecified: Secondary | ICD-10-CM | POA: Diagnosis not present

## 2024-10-16 NOTE — Progress Notes (Signed)
 [facility] Acute Care Progress Note    Location:  Other [facility] [place].  Nursing Home Room Number: [facility] [id number] Place of Service:  SNF (31)   Laurence Locus, DO   Patient Care Team: Laurence Locus, DO as PCP - General (Internal Medicine) Hobart Powell BRAVO, MD (Inactive) as PCP - Cardiology (Cardiology) Inocencio Soyla Lunger, MD as PCP - Electrophysiology (Cardiology)   Extended Emergency Contact Information Primary Emergency Contact: Digangi,Danny Mobile Phone: [phone] Relation: Son Secondary Emergency Contact: Gordon,NORMA  [place] Home Phone: [phone] Relation: Daughter   Goals of care: Advanced Directive information    09/29/2024   12:23 PM  Advanced Directives  Does Patient Have a Medical Advance Directive? Yes  Type of Advance Directive Out of facility DNR (pink MOST or yellow form)  Does patient want to make changes to medical advance directive? No - Patient declined  Pre-existing out of facility DNR order (yellow form or pink MOST form) Yellow form placed in chart (order not valid for inpatient use)     CODE STATUS: Do Not Resuscitate (DNR). Hospice Care.   Chief Complaint  Patient presents with   Hypertension    Uncontrolled Hypertension.      HPI: Pt is a 89 y.o. female seen today for an acute visit for Uncontrolled Hypertension.  Recorded blood pressures noted to be with systolic greater than 180.  Patient not having any symptoms.  Patient unable to give any history due to prior stroke.  She does have left-sided hemiplegia.  Patient noted to have worsening right lower extremity edema.  There is a blister noted on the left lateral side of her left leg near the popliteal fossa that was new to the CNA that normally takes care of her.  Pictures were taken.    Patient is on Eliquis  5 mg twice daily for history of A-fib.  She is not taking a diuretic on a regular basis.  She does have it as needed.  Last dose of Lasix  was given on Friday,  October 03, 2024.  Patient was recently enrolled in hospice care.  Discussed the case with the hospice case manager Laymon Haver, RN.  Discussed discontinuing all unnecessary medications including Benefiber, Florastor, estradiol, Crestor , vitamin D3, nitroglycerin .  Discussed that lower extremity ultrasound to rule out DVT would not be beneficial in this patient and to continue her on Eliquis  as ordered.  Will diurese her with Lasix  40 mg twice daily for 3 days along with supplemental potassium for 3 days.   Past Medical History:  Diagnosis Date   Back pain    Bruises easily    C. difficile diarrhea 02/14/2024   Coronary artery disease    NONOBSTRUCTIVE   Fatigue    Hyperlipidemia    Hypertension    Knee pain    OA (osteoarthritis)    Obesity    Palpitations    SOB (shortness of breath)    Past Surgical History:  Procedure Laterality Date   BREAST BIOPSY     LEFT BREAST   CARDIAC CATHETERIZATION  05/11/2006   OVERALL CARDIAC SIZE AND SILHOUETTE ARE NORMAL. EF ESTIMATED 60%   INTRAMEDULLARY (IM) NAIL INTERTROCHANTERIC Left 03/12/2023   Procedure: INTRAMEDULLARY (IM) NAIL INTERTROCHANTERIC;  Surgeon: Kendal Franky SQUIBB, MD;  Location: [facility] OR;  Service: Orthopedics;  Laterality: Left;   KIDNEY STONES  1982   KNEE ARTHROSCOPY  09/22/2008   VAGINAL HYSTERECTOMY       Allergies  Allergen Reactions   Codeine Other (  See Comments)    Unknown reaction   Demerol [Meperidine Hcl] Other (See Comments)    Unknown reaction   Morphine And Codeine Other (See Comments)    Unknown reaction     Outpatient Encounter Medications as of 10/16/2024  Medication Sig   ALPRAZolam  (XANAX ) 1 MG tablet Take 1 tablet (1 mg total) by mouth at bedtime.   apixaban  (ELIQUIS ) 5 MG TABS tablet Take 1 tablet (5 mg total) by mouth 2 (two) times daily.   benzocaine (HURRICAINE) 20 % GEL Use as directed 1 Application in the mouth or throat every 6 (six) hours as needed.   Cholecalciferol (VITAMIN D -3) 25 MCG  (1000 UT) CAPS One by mouth daily.   Emollient (AQUAPHOR EX) Apply 1 Application topically as directed. Apply to R great toe wound topically every day and evening shift for wound care Clean area with NS or generic wound cleaner and pat dry. Apply aquaphor to nail bed and cover with bandaid. Change twice daily until resolve   estradiol (ESTRACE) 0.1 MG/GM vaginal cream Place 1 Applicatorful vaginally. At bedtime every Mon, Wed, Fri   famotidine  (PEPCID ) 20 MG tablet Take 20 mg by mouth at bedtime.   furosemide  (LASIX ) 40 MG tablet 40 mg tablet as needed for weight gain of 3 lbs overnight or 6 lbs in a week.   hydrocortisone (ANUSOL-HC) 25 MG suppository Place 25 mg rectally every 12 (twelve) hours as needed for hemorrhoids.   losartan  (COZAAR ) 50 MG tablet Take 50 mg by mouth daily.   metoprolol  tartrate (LOPRESSOR ) 50 MG tablet Take 50 mg by mouth 2 (two) times daily.   nitroGLYCERIN  (NITROSTAT ) 0.4 MG SL tablet Place 1 tablet (0.4 mg total) under the tongue every 5 (five) minutes as needed for chest pain.   Nutritional Supplements (ENSURE CLEAR) LIQD Take by mouth daily.   Nutritional Supplements (PROSOURCE PLUS PO) Take 8 oz by mouth 2 (two) times daily. two times a day for wound healing Mix with 8 oz drink of choice   potassium chloride  SA (KLOR-CON  M) 20 MEQ tablet Give 20meq by mouth as needed for supplement with Lasix  as needed.   povidone-iodine  (BETADINE  SWABSTICKS) 10 % swab Apply 1 Application topically as directed. Apply to L heel DTI topically every day shift for wound care   rosuvastatin  (CRESTOR ) 10 MG tablet Take 1 tablet (10 mg total) by mouth daily.   saccharomyces boulardii (FLORASTOR) 250 MG capsule Take 250 mg by mouth 2 (two) times daily.   sertraline (ZOLOFT) 100 MG tablet Take 150 mg by mouth at bedtime.   traMADol  (ULTRAM ) 50 MG tablet Take 1 tablet (50 mg total) by mouth every 6 (six) hours as needed. Give 2 tablets by mouth every 6 hours as needed for severe pain.   Wheat  Dextrin (BENEFIBER) POWD Take 1 each by mouth daily.   Zinc Oxide (TRIPLE PASTE) 12.8 % ointment Apply 1 Application topically. To peri area and skin folds topically twice daily   No facility-administered encounter medications on file as of 10/16/2024.     Review of Systems  Unable to perform ROS: Dementia     Immunization History  Administered Date(s) Administered   INFLUENZA, HIGH DOSE SEASONAL PF 11/23/2022   Influenza-Unspecified 10/10/2023, 09/30/2024   Moderna Covid-19 Vaccine Bivalent Booster 2yrs & up 12/19/2023   PFIZER(Purple Top)SARS-COV-2 Vaccination 01/07/2020, 01/27/2020, 11/18/2020   PNEUMOCOCCAL CONJUGATE-20 12/14/2023   Pertinent  Health Maintenance Due  Topic Date Due   Influenza Vaccine  Completed   DEXA  SCAN  Discontinued      01/10/2024    1:26 PM  Fall Risk  Falls in the past year? 0  Was there an injury with Fall? 0  Fall Risk Category Calculator 0   Functional Status Survey:     Vitals:   10/16/24 0804 10/16/24 0936  BP: (!) 201/91 (!) 160/72  Pulse: 70   Resp: 18   Temp: 98.3 F (36.8 C)   SpO2: 95%   Weight: 165 lb 12.8 oz (75.2 kg)   Height: 5' 6 (1.676 m)    Body mass index is 26.76 kg/m. Physical Exam Vitals and nursing note reviewed.  Constitutional:      General: She is not in acute distress.    Appearance: She is not toxic-appearing.     Comments: elderly  HENT:     Head: Atraumatic.  Cardiovascular:     Rate and Rhythm: Normal rate. Rhythm irregular.  Pulmonary:     Effort: Pulmonary effort is normal. No respiratory distress.     Breath sounds: Normal breath sounds.  Musculoskeletal:     Left lower leg: Edema present.     Comments: +2 pitting posterior lower leg edema. Blister noted at left lateral aspect of lower leg near popliteal fossa. See picture  Skin:    General: Skin is warm and dry.     Capillary Refill: Capillary refill takes less than 2 seconds.           Labs reviewed: Recent Labs     12/11/23 1601 12/12/23 0604 12/12/23 0605 12/13/23 0943 12/20/23 0000 07/07/24 0000 07/10/24 0000 07/31/24 1459  NA 136  --  137 136   < > 140 136* 138  K 3.2*  --  3.2* 3.6   < > 3.8 3.9 4.1  CL 98  --  99 101   < > 100 105 107  CO2 26  --  27 26   < > 34* 23* 25*  GLUCOSE 139*  --  116* 145*  --   --   --   --   BUN 14  --  15 12   < > 19 18 12   CREATININE 0.73  --  0.91 0.69   < > 0.7 0.7 0.4*  CALCIUM  9.1  --  8.8* 8.9   < > 9.0 8.7 8.9  MG  --  2.0  --   --   --   --   --   --    < > = values in this interval not displayed.   Recent Labs    12/11/23 1601 12/20/23 0000 03/06/24 0000 04/18/24 0000 07/31/24 1459  AST 22 15 31 27 18   ALT 10 8 20 16 8   ALKPHOS 74 110 81 74 84  BILITOT 1.0  --   --   --   --   PROT 7.1  --   --   --   --   ALBUMIN 3.4* 3.1* 2.6* 2.8*  --    Recent Labs    12/11/23 1601 12/12/23 0605 12/20/23 0000 04/18/24 0000 07/07/24 0000 07/10/24 0000 07/31/24 1459  WBC 9.4 10.6*   < > 10.5 8.9 9.3 9.4  NEUTROABS 6.7  --    < > 6,983.00 4,948.00 5,896.00  --   HGB 14.2 13.7   < > 12.8 13.4 12.6 12.6  HCT 43.2 40.3   < > 38 42 39 39  MCV 92.9 89.6  --   --   --   --   --  PLT 347 342   < > 310 314 286 311   < > = values in this interval not displayed.   Lab Results  Component Value Date   TSH 4.35 08/27/2023   Lab Results  Component Value Date   HGBA1C 6.5 (H) 12/12/2023   Lab Results  Component Value Date   CHOL 185 12/12/2023   HDL 32 (L) 12/12/2023   LDLCALC 130 (H) 12/12/2023   TRIG 117 12/12/2023   CHOLHDL 5.8 12/12/2023    Assessment & Plan Edema of left lower leg Patient already on Eliquis  5 mg twice daily.  Discussed with hospice care manager Laymon Haver, RN.  Patient is on comfort care for hospice.  Will forego lower extremity ultrasound.  Continue on Eliquis  5 mg twice daily.  Will diurese her with Lasix  40 mg twice daily.  Give her supplemental potassium while she is being diuresed.  Will hold the losartan  while  she is being diuresed.     Hospice care Discussed with the hospice care manager Laymon Haver.  Patient is now on comfort care according to her.  She has discussed this with the family.  They are in agreement.  Will discontinue all unnecessary medications.Discussed discontinuing all unnecessary medications including Benefiber, Florastor, estradiol, Crestor , vitamin D3, nitroglycerin .     Permanent atrial fibrillation (HCC) Will continue Eliquis  5 mg twice daily.  Given that she does have A-fib and is now bedbound.  She is at high risk for developing DVT.        Camellia Door, DO  [facility] [phone]

## 2024-10-16 NOTE — Assessment & Plan Note (Addendum)
 Discussed with the hospice care manager Laymon Haver.  Patient is now on comfort care according to her.  She has discussed this with the family.  They are in agreement.  Will discontinue all unnecessary medications.Discussed discontinuing all unnecessary medications including Benefiber, Florastor, estradiol, Crestor , vitamin D3, nitroglycerin .

## 2024-10-16 NOTE — Assessment & Plan Note (Addendum)
 Patient already on Eliquis  5 mg twice daily.  Discussed with hospice care manager Laymon Haver, RN.  Patient is on comfort care for hospice.  Will forego lower extremity ultrasound.  Continue on Eliquis  5 mg twice daily.  Will diurese her with Lasix  40 mg twice daily.  Give her supplemental potassium while she is being diuresed.  Will hold the losartan  while she is being diuresed.

## 2024-10-16 NOTE — Assessment & Plan Note (Addendum)
 Will continue Eliquis  5 mg twice daily.  Given that she does have A-fib and is now bedbound.  She is at high risk for developing DVT.

## 2024-10-17 DIAGNOSIS — E785 Hyperlipidemia, unspecified: Secondary | ICD-10-CM | POA: Diagnosis not present

## 2024-10-17 DIAGNOSIS — I5032 Chronic diastolic (congestive) heart failure: Secondary | ICD-10-CM | POA: Diagnosis not present

## 2024-10-17 DIAGNOSIS — I10 Essential (primary) hypertension: Secondary | ICD-10-CM | POA: Diagnosis not present

## 2024-10-17 DIAGNOSIS — I4891 Unspecified atrial fibrillation: Secondary | ICD-10-CM | POA: Diagnosis not present

## 2024-10-17 DIAGNOSIS — I679 Cerebrovascular disease, unspecified: Secondary | ICD-10-CM | POA: Diagnosis not present

## 2024-10-17 DIAGNOSIS — N1831 Chronic kidney disease, stage 3a: Secondary | ICD-10-CM | POA: Diagnosis not present

## 2024-10-18 DIAGNOSIS — E119 Type 2 diabetes mellitus without complications: Secondary | ICD-10-CM | POA: Diagnosis not present

## 2024-10-18 DIAGNOSIS — R634 Abnormal weight loss: Secondary | ICD-10-CM | POA: Diagnosis not present

## 2024-10-18 DIAGNOSIS — F01518 Vascular dementia, unspecified severity, with other behavioral disturbance: Secondary | ICD-10-CM | POA: Diagnosis not present

## 2024-10-18 DIAGNOSIS — F32A Depression, unspecified: Secondary | ICD-10-CM | POA: Diagnosis not present

## 2024-10-18 DIAGNOSIS — I5032 Chronic diastolic (congestive) heart failure: Secondary | ICD-10-CM | POA: Diagnosis not present

## 2024-10-18 DIAGNOSIS — I10 Essential (primary) hypertension: Secondary | ICD-10-CM | POA: Diagnosis not present

## 2024-10-18 DIAGNOSIS — I4891 Unspecified atrial fibrillation: Secondary | ICD-10-CM | POA: Diagnosis not present

## 2024-10-18 DIAGNOSIS — E785 Hyperlipidemia, unspecified: Secondary | ICD-10-CM | POA: Diagnosis not present

## 2024-10-18 DIAGNOSIS — L89626 Pressure-induced deep tissue damage of left heel: Secondary | ICD-10-CM | POA: Diagnosis not present

## 2024-10-18 DIAGNOSIS — F419 Anxiety disorder, unspecified: Secondary | ICD-10-CM | POA: Diagnosis not present

## 2024-10-18 DIAGNOSIS — N1831 Chronic kidney disease, stage 3a: Secondary | ICD-10-CM | POA: Diagnosis not present

## 2024-10-18 DIAGNOSIS — I679 Cerebrovascular disease, unspecified: Secondary | ICD-10-CM | POA: Diagnosis not present

## 2024-10-20 DIAGNOSIS — N1831 Chronic kidney disease, stage 3a: Secondary | ICD-10-CM | POA: Diagnosis not present

## 2024-10-20 DIAGNOSIS — I679 Cerebrovascular disease, unspecified: Secondary | ICD-10-CM | POA: Diagnosis not present

## 2024-10-20 DIAGNOSIS — I4891 Unspecified atrial fibrillation: Secondary | ICD-10-CM | POA: Diagnosis not present

## 2024-10-20 DIAGNOSIS — I10 Essential (primary) hypertension: Secondary | ICD-10-CM | POA: Diagnosis not present

## 2024-10-20 DIAGNOSIS — E785 Hyperlipidemia, unspecified: Secondary | ICD-10-CM | POA: Diagnosis not present

## 2024-10-20 DIAGNOSIS — I5032 Chronic diastolic (congestive) heart failure: Secondary | ICD-10-CM | POA: Diagnosis not present

## 2024-10-21 DIAGNOSIS — I4891 Unspecified atrial fibrillation: Secondary | ICD-10-CM | POA: Diagnosis not present

## 2024-10-21 DIAGNOSIS — E785 Hyperlipidemia, unspecified: Secondary | ICD-10-CM | POA: Diagnosis not present

## 2024-10-21 DIAGNOSIS — I679 Cerebrovascular disease, unspecified: Secondary | ICD-10-CM | POA: Diagnosis not present

## 2024-10-21 DIAGNOSIS — I5032 Chronic diastolic (congestive) heart failure: Secondary | ICD-10-CM | POA: Diagnosis not present

## 2024-10-21 DIAGNOSIS — I10 Essential (primary) hypertension: Secondary | ICD-10-CM | POA: Diagnosis not present

## 2024-10-21 DIAGNOSIS — N1831 Chronic kidney disease, stage 3a: Secondary | ICD-10-CM | POA: Diagnosis not present

## 2024-10-23 ENCOUNTER — Encounter: Payer: Self-pay | Admitting: Nurse Practitioner

## 2024-10-23 ENCOUNTER — Non-Acute Institutional Stay (SKILLED_NURSING_FACILITY): Payer: Self-pay | Admitting: Nurse Practitioner

## 2024-10-23 DIAGNOSIS — R600 Localized edema: Secondary | ICD-10-CM | POA: Diagnosis not present

## 2024-10-23 DIAGNOSIS — F03C4 Unspecified dementia, severe, with anxiety: Secondary | ICD-10-CM | POA: Diagnosis not present

## 2024-10-23 DIAGNOSIS — I10 Essential (primary) hypertension: Secondary | ICD-10-CM

## 2024-10-23 DIAGNOSIS — I69354 Hemiplegia and hemiparesis following cerebral infarction affecting left non-dominant side: Secondary | ICD-10-CM

## 2024-10-23 DIAGNOSIS — I5032 Chronic diastolic (congestive) heart failure: Secondary | ICD-10-CM | POA: Diagnosis not present

## 2024-10-23 DIAGNOSIS — A0471 Enterocolitis due to Clostridium difficile, recurrent: Secondary | ICD-10-CM

## 2024-10-23 DIAGNOSIS — F331 Major depressive disorder, recurrent, moderate: Secondary | ICD-10-CM

## 2024-10-23 DIAGNOSIS — I4821 Permanent atrial fibrillation: Secondary | ICD-10-CM

## 2024-10-23 DIAGNOSIS — N1831 Chronic kidney disease, stage 3a: Secondary | ICD-10-CM

## 2024-10-23 DIAGNOSIS — I4891 Unspecified atrial fibrillation: Secondary | ICD-10-CM | POA: Diagnosis not present

## 2024-10-23 DIAGNOSIS — I679 Cerebrovascular disease, unspecified: Secondary | ICD-10-CM | POA: Diagnosis not present

## 2024-10-23 DIAGNOSIS — E785 Hyperlipidemia, unspecified: Secondary | ICD-10-CM | POA: Diagnosis not present

## 2024-10-23 NOTE — Progress Notes (Signed)
 Location:  Other [facility].  Nursing Home Room Number: [facility] [id number] Place of Service:  SNF [phone]) Harlene An, NP  PCP: Laurence Locus, DO  Patient Care Team: Laurence Locus, DO as PCP - General (Internal Medicine) Hobart Powell BRAVO, MD (Inactive) as PCP - Cardiology (Cardiology) Inocencio Soyla Lunger, MD as PCP - Electrophysiology (Cardiology)  Extended Emergency Contact Information Primary Emergency Contact: Haidar,Danny Mobile Phone: [phone] Relation: Son Secondary Emergency Contact: Gordon,NORMA  [place] Home Phone: [phone] Relation: Daughter  Goals of care: Advanced Directive information    09/29/2024   12:23 PM  Advanced Directives  Does Patient Have a Medical Advance Directive? Yes  Type of Advance Directive Out of facility DNR (pink MOST or yellow form)  Does patient want to make changes to medical advance directive? No - Patient declined  Pre-existing out of facility DNR order (yellow form or pink MOST form) Yellow form placed in chart (order not valid for inpatient use)     Chief Complaint  Patient presents with   Medical Management of Chronic Issues    Medical Management of Chronic Issues.     HPI:  Pt is a 89 y.o. female seen today for medical management of chronic disease. Pt under  hospice care at this time. She has hx of dementia, CVA, chronic diarrhea, anxiety, a fib and CHF.  Benefiber and florastor was recently stopped without changes in diarrhea noted She was given increase in lasix  due to LE edema which has since improved.  Blood pressure has been better controlled on metoprolol  and losartan  since increase in lasix .  She denies pain or anxiety    Past Medical History:  Diagnosis Date   Back pain    Bruises easily    C. difficile diarrhea 02/14/2024   Coronary artery disease    NONOBSTRUCTIVE   Fatigue    Hyperlipidemia    Hypertension    Knee pain    OA (osteoarthritis)    Obesity    Palpitations    SOB  (shortness of breath)    Past Surgical History:  Procedure Laterality Date   BREAST BIOPSY     LEFT BREAST   CARDIAC CATHETERIZATION  05/11/2006   OVERALL CARDIAC SIZE AND SILHOUETTE ARE NORMAL. EF ESTIMATED 60%   INTRAMEDULLARY (IM) NAIL INTERTROCHANTERIC Left 03/12/2023   Procedure: INTRAMEDULLARY (IM) NAIL INTERTROCHANTERIC;  Surgeon: Kendal Franky SQUIBB, MD;  Location: [facility] OR;  Service: Orthopedics;  Laterality: Left;   KIDNEY STONES  1982   KNEE ARTHROSCOPY  09/22/2008   VAGINAL HYSTERECTOMY      Allergies  Allergen Reactions   Codeine Other (See Comments)    Unknown reaction   Demerol [Meperidine Hcl] Other (See Comments)    Unknown reaction   Morphine And Codeine Other (See Comments)    Unknown reaction    Outpatient Encounter Medications as of 10/23/2024  Medication Sig   apixaban  (ELIQUIS ) 5 MG TABS tablet Take 1 tablet (5 mg total) by mouth 2 (two) times daily.   Emollient (AQUAPHOR EX) Apply 1 Application topically as directed. Apply to R great toe wound topically every day and evening shift for wound care Clean area with NS or generic wound cleaner and pat dry. Apply aquaphor to nail bed and cover with bandaid. Change twice daily until resolve   famotidine  (PEPCID ) 20 MG tablet Take 20 mg by mouth at bedtime.   furosemide  (LASIX ) 40 MG tablet 40 mg tablet as needed for weight gain of 3 lbs overnight or 6  lbs in a week.   losartan  (COZAAR ) 50 MG tablet Take 50 mg by mouth daily.   metoprolol  tartrate (LOPRESSOR ) 50 MG tablet Take 50 mg by mouth 2 (two) times daily.   Nutritional Supplements (ENSURE CLEAR) LIQD Take by mouth daily.   Nutritional Supplements (PROSOURCE PLUS PO) Take 8 oz by mouth 2 (two) times daily. two times a day for wound healing Mix with 8 oz drink of choice   potassium chloride  SA (KLOR-CON  M) 20 MEQ tablet Give 20meq by mouth as needed for supplement with Lasix  as needed.   povidone-iodine  (BETADINE  SWABSTICKS) 10 % swab Apply 1 Application topically as  directed. Apply to L heel DTI topically every day shift for wound care   sertraline (ZOLOFT) 100 MG tablet Take 150 mg by mouth at bedtime.   traMADol  (ULTRAM ) 50 MG tablet Take 1 tablet (50 mg total) by mouth every 6 (six) hours as needed. Give 2 tablets by mouth every 6 hours as needed for severe pain.   Zinc Oxide (TRIPLE PASTE) 12.8 % ointment Apply 1 Application topically. To peri area and skin folds topically twice daily   ALPRAZolam  (XANAX ) 1 MG tablet Take 1 tablet (1 mg total) by mouth at bedtime. (Patient not taking: Reported on 10/23/2024)   benzocaine (HURRICAINE) 20 % GEL Use as directed 1 Application in the mouth or throat every 6 (six) hours as needed. (Patient not taking: Reported on 10/23/2024)   Cholecalciferol (VITAMIN D -3) 25 MCG (1000 UT) CAPS One by mouth daily. (Patient not taking: Reported on 10/23/2024)   estradiol (ESTRACE) 0.1 MG/GM vaginal cream Place 1 Applicatorful vaginally. At bedtime every Mon, Wed, Fri (Patient not taking: Reported on 10/23/2024)   hydrocortisone (ANUSOL-HC) 25 MG suppository Place 25 mg rectally every 12 (twelve) hours as needed for hemorrhoids. (Patient not taking: Reported on 10/23/2024)   nitroGLYCERIN  (NITROSTAT ) 0.4 MG SL tablet Place 1 tablet (0.4 mg total) under the tongue every 5 (five) minutes as needed for chest pain. (Patient not taking: Reported on 10/23/2024)   rosuvastatin  (CRESTOR ) 10 MG tablet Take 1 tablet (10 mg total) by mouth daily. (Patient not taking: Reported on 10/23/2024)   saccharomyces boulardii (FLORASTOR) 250 MG capsule Take 250 mg by mouth 2 (two) times daily. (Patient not taking: Reported on 10/23/2024)   Wheat Dextrin (BENEFIBER) POWD Take 1 each by mouth daily. (Patient not taking: Reported on 10/23/2024)   No facility-administered encounter medications on file as of 10/23/2024.    Review of Systems  Unable to perform ROS: Dementia     Immunization History  Administered Date(s) Administered   INFLUENZA, HIGH DOSE  SEASONAL PF 11/23/2022   Influenza-Unspecified 10/10/2023, 09/30/2024   Moderna Covid-19 Vaccine Bivalent Booster 3yrs & up 12/19/2023   PFIZER(Purple Top)SARS-COV-2 Vaccination 01/07/2020, 01/27/2020, 11/18/2020   PNEUMOCOCCAL CONJUGATE-20 12/14/2023   Pertinent  Health Maintenance Due  Topic Date Due   Influenza Vaccine  Completed   DEXA SCAN  Discontinued      01/10/2024    1:26 PM  Fall Risk  Falls in the past year? 0  Was there an injury with Fall? 0  Fall Risk Category Calculator 0   Functional Status Survey:    Vitals:   10/23/24 1142  BP: 106/61  Pulse: 65  Resp: 16  Temp: 98.1 F (36.7 C)  SpO2: 93%  Weight: 164 lb 9.6 oz (74.7 kg)  Height: 5' 6 (1.676 m)   Body mass index is 26.57 kg/m. Physical Exam Constitutional:  General: She is not in acute distress.    Appearance: She is well-developed. She is not diaphoretic.  HENT:     Head: Normocephalic and atraumatic.     Mouth/Throat:     Pharynx: No oropharyngeal exudate.  Eyes:     Conjunctiva/sclera: Conjunctivae normal.     Pupils: Pupils are equal, round, and reactive to light.  Cardiovascular:     Rate and Rhythm: Normal rate and regular rhythm.     Heart sounds: Normal heart sounds.  Pulmonary:     Effort: Pulmonary effort is normal.     Breath sounds: Normal breath sounds.  Abdominal:     General: Bowel sounds are normal.     Palpations: Abdomen is soft.  Musculoskeletal:     Cervical back: Normal range of motion and neck supple.     Right lower leg: No edema.     Left lower leg: No edema.  Skin:    General: Skin is warm and dry.  Neurological:     Mental Status: She is alert. She is disoriented.     Motor: Weakness present.     Gait: Gait abnormal.     Comments: Left sided hemiparesis   Psychiatric:        Mood and Affect: Mood normal.     Labs reviewed: Recent Labs    12/11/23 1601 12/12/23 0604 12/12/23 0605 12/13/23 0943 12/20/23 0000 07/07/24 0000 07/10/24 0000  07/31/24 1459  NA 136  --  137 136   < > 140 136* 138  K 3.2*  --  3.2* 3.6   < > 3.8 3.9 4.1  CL 98  --  99 101   < > 100 105 107  CO2 26  --  27 26   < > 34* 23* 25*  GLUCOSE 139*  --  116* 145*  --   --   --   --   BUN 14  --  15 12   < > 19 18 12   CREATININE 0.73  --  0.91 0.69   < > 0.7 0.7 0.4*  CALCIUM  9.1  --  8.8* 8.9   < > 9.0 8.7 8.9  MG  --  2.0  --   --   --   --   --   --    < > = values in this interval not displayed.   Recent Labs    12/11/23 1601 12/20/23 0000 03/06/24 0000 04/18/24 0000 07/31/24 1459  AST 22 15 31 27 18   ALT 10 8 20 16 8   ALKPHOS 74 110 81 74 84  BILITOT 1.0  --   --   --   --   PROT 7.1  --   --   --   --   ALBUMIN 3.4* 3.1* 2.6* 2.8*  --    Recent Labs    12/11/23 1601 12/12/23 0605 12/20/23 0000 04/18/24 0000 07/07/24 0000 07/10/24 0000 07/31/24 1459  WBC 9.4 10.6*   < > 10.5 8.9 9.3 9.4  NEUTROABS 6.7  --    < > 6,983.00 4,948.00 5,896.00  --   HGB 14.2 13.7   < > 12.8 13.4 12.6 12.6  HCT 43.2 40.3   < > 38 42 39 39  MCV 92.9 89.6  --   --   --   --   --   PLT 347 342   < > 310 314 286 311   < > = values in this interval not displayed.  Lab Results  Component Value Date   TSH 4.35 08/27/2023   Lab Results  Component Value Date   HGBA1C 6.5 (H) 12/12/2023   Lab Results  Component Value Date   CHOL 185 12/12/2023   HDL 32 (L) 12/12/2023   LDLCALC 130 (H) 12/12/2023   TRIG 117 12/12/2023   CHOLHDL 5.8 12/12/2023    Significant Diagnostic Results in last 30 days:  No results found.  Assessment/Plan Permanent atrial fibrillation (HCC) Stable on metoprolol  and continues on eliquis  for anticoagulation   Moderate episode of recurrent major depressive disorder (HCC) Stable on current dose of zoloft 100 mg daily, continue at this time.    Hypertension Blood pressure well controlled on metoprolol  and losartan  Continue current medications and dietary modifications follow metabolic panel.   Hemiparesis of left  nondominant side as late effect of cerebral infarction (HCC) Unchanged, continue with supportive care   Edema of left lower leg Edema has improved with increase in lasix . Continue elevation.  Staff to monitor.   Chronic heart failure with preserved ejection fraction (HFpEF) (HCC) Euvolemic, continues on lasix  twice daily and metoprolol  at this time.  Weight stable. No LE edema.  Stage 3a chronic kidney disease (CKD) (HCC) Chronic and stable Encourage proper hydration Follow metabolic panel Avoid nephrotoxic meds (NSAIDS)   Recurrent Clostridium difficile diarrhea Colonized for c diff without active infection.  Probiotics and benfiber stopped without changes in stools.      [facility] K[facility] Caro BODILY [facility] [phone]

## 2024-10-23 NOTE — Assessment & Plan Note (Signed)
 Unchanged, continue with supportive care

## 2024-10-23 NOTE — Assessment & Plan Note (Signed)
 Stable on metoprolol  and continues on eliquis  for anticoagulation

## 2024-10-23 NOTE — Assessment & Plan Note (Signed)
 Edema has improved with increase in lasix . Continue elevation.  Staff to monitor.

## 2024-10-23 NOTE — Assessment & Plan Note (Signed)
 Blood pressure well controlled on metoprolol  and losartan  Continue current medications and dietary modifications follow metabolic panel.

## 2024-10-23 NOTE — Assessment & Plan Note (Signed)
 Chronic and stable Encourage proper hydration Follow metabolic panel Avoid nephrotoxic meds (NSAIDS)

## 2024-10-23 NOTE — Assessment & Plan Note (Signed)
 Colonized for c diff without active infection.  Probiotics and benfiber stopped without changes in stools.

## 2024-10-23 NOTE — Assessment & Plan Note (Signed)
 Stable on current dose of zoloft 100 mg daily, continue at this time.

## 2024-10-23 NOTE — Assessment & Plan Note (Signed)
 Euvolemic, continues on lasix  twice daily and metoprolol  at this time.  Weight stable. No LE edema.

## 2024-10-24 DIAGNOSIS — I10 Essential (primary) hypertension: Secondary | ICD-10-CM | POA: Diagnosis not present

## 2024-10-24 DIAGNOSIS — I4891 Unspecified atrial fibrillation: Secondary | ICD-10-CM | POA: Diagnosis not present

## 2024-10-24 DIAGNOSIS — E785 Hyperlipidemia, unspecified: Secondary | ICD-10-CM | POA: Diagnosis not present

## 2024-10-24 DIAGNOSIS — N1831 Chronic kidney disease, stage 3a: Secondary | ICD-10-CM | POA: Diagnosis not present

## 2024-10-24 DIAGNOSIS — I679 Cerebrovascular disease, unspecified: Secondary | ICD-10-CM | POA: Diagnosis not present

## 2024-10-24 DIAGNOSIS — I5032 Chronic diastolic (congestive) heart failure: Secondary | ICD-10-CM | POA: Diagnosis not present

## 2024-10-26 DIAGNOSIS — F321 Major depressive disorder, single episode, moderate: Secondary | ICD-10-CM | POA: Diagnosis not present

## 2024-10-27 DIAGNOSIS — I4891 Unspecified atrial fibrillation: Secondary | ICD-10-CM | POA: Diagnosis not present

## 2024-10-27 DIAGNOSIS — E785 Hyperlipidemia, unspecified: Secondary | ICD-10-CM | POA: Diagnosis not present

## 2024-10-27 DIAGNOSIS — I679 Cerebrovascular disease, unspecified: Secondary | ICD-10-CM | POA: Diagnosis not present

## 2024-10-27 DIAGNOSIS — I5032 Chronic diastolic (congestive) heart failure: Secondary | ICD-10-CM | POA: Diagnosis not present

## 2024-10-27 DIAGNOSIS — I10 Essential (primary) hypertension: Secondary | ICD-10-CM | POA: Diagnosis not present

## 2024-10-27 DIAGNOSIS — N1831 Chronic kidney disease, stage 3a: Secondary | ICD-10-CM | POA: Diagnosis not present

## 2024-10-28 DIAGNOSIS — I5032 Chronic diastolic (congestive) heart failure: Secondary | ICD-10-CM | POA: Diagnosis not present

## 2024-10-28 DIAGNOSIS — I679 Cerebrovascular disease, unspecified: Secondary | ICD-10-CM | POA: Diagnosis not present

## 2024-10-28 DIAGNOSIS — N1831 Chronic kidney disease, stage 3a: Secondary | ICD-10-CM | POA: Diagnosis not present

## 2024-10-28 DIAGNOSIS — I4891 Unspecified atrial fibrillation: Secondary | ICD-10-CM | POA: Diagnosis not present

## 2024-10-28 DIAGNOSIS — I10 Essential (primary) hypertension: Secondary | ICD-10-CM | POA: Diagnosis not present

## 2024-10-28 DIAGNOSIS — E785 Hyperlipidemia, unspecified: Secondary | ICD-10-CM | POA: Diagnosis not present

## 2024-10-30 DIAGNOSIS — I4891 Unspecified atrial fibrillation: Secondary | ICD-10-CM | POA: Diagnosis not present

## 2024-10-30 DIAGNOSIS — I679 Cerebrovascular disease, unspecified: Secondary | ICD-10-CM | POA: Diagnosis not present

## 2024-10-30 DIAGNOSIS — I5032 Chronic diastolic (congestive) heart failure: Secondary | ICD-10-CM | POA: Diagnosis not present

## 2024-10-30 DIAGNOSIS — I10 Essential (primary) hypertension: Secondary | ICD-10-CM | POA: Diagnosis not present

## 2024-10-30 DIAGNOSIS — E785 Hyperlipidemia, unspecified: Secondary | ICD-10-CM | POA: Diagnosis not present

## 2024-10-30 DIAGNOSIS — N1831 Chronic kidney disease, stage 3a: Secondary | ICD-10-CM | POA: Diagnosis not present

## 2024-10-30 DIAGNOSIS — F321 Major depressive disorder, single episode, moderate: Secondary | ICD-10-CM | POA: Diagnosis not present

## 2024-10-31 DIAGNOSIS — N1831 Chronic kidney disease, stage 3a: Secondary | ICD-10-CM | POA: Diagnosis not present

## 2024-10-31 DIAGNOSIS — I679 Cerebrovascular disease, unspecified: Secondary | ICD-10-CM | POA: Diagnosis not present

## 2024-10-31 DIAGNOSIS — I5032 Chronic diastolic (congestive) heart failure: Secondary | ICD-10-CM | POA: Diagnosis not present

## 2024-10-31 DIAGNOSIS — I4891 Unspecified atrial fibrillation: Secondary | ICD-10-CM | POA: Diagnosis not present

## 2024-10-31 DIAGNOSIS — I10 Essential (primary) hypertension: Secondary | ICD-10-CM | POA: Diagnosis not present

## 2024-10-31 DIAGNOSIS — E785 Hyperlipidemia, unspecified: Secondary | ICD-10-CM | POA: Diagnosis not present

## 2024-11-02 DIAGNOSIS — E785 Hyperlipidemia, unspecified: Secondary | ICD-10-CM | POA: Diagnosis not present

## 2024-11-02 DIAGNOSIS — I10 Essential (primary) hypertension: Secondary | ICD-10-CM | POA: Diagnosis not present

## 2024-11-02 DIAGNOSIS — I5032 Chronic diastolic (congestive) heart failure: Secondary | ICD-10-CM | POA: Diagnosis not present

## 2024-11-02 DIAGNOSIS — I679 Cerebrovascular disease, unspecified: Secondary | ICD-10-CM | POA: Diagnosis not present

## 2024-11-02 DIAGNOSIS — I4891 Unspecified atrial fibrillation: Secondary | ICD-10-CM | POA: Diagnosis not present

## 2024-11-02 DIAGNOSIS — N1831 Chronic kidney disease, stage 3a: Secondary | ICD-10-CM | POA: Diagnosis not present

## 2024-11-04 DIAGNOSIS — I10 Essential (primary) hypertension: Secondary | ICD-10-CM | POA: Diagnosis not present

## 2024-11-04 DIAGNOSIS — I5032 Chronic diastolic (congestive) heart failure: Secondary | ICD-10-CM | POA: Diagnosis not present

## 2024-11-04 DIAGNOSIS — I679 Cerebrovascular disease, unspecified: Secondary | ICD-10-CM | POA: Diagnosis not present

## 2024-11-04 DIAGNOSIS — N1831 Chronic kidney disease, stage 3a: Secondary | ICD-10-CM | POA: Diagnosis not present

## 2024-11-04 DIAGNOSIS — I4891 Unspecified atrial fibrillation: Secondary | ICD-10-CM | POA: Diagnosis not present

## 2024-11-04 DIAGNOSIS — E785 Hyperlipidemia, unspecified: Secondary | ICD-10-CM | POA: Diagnosis not present

## 2024-11-06 DIAGNOSIS — E785 Hyperlipidemia, unspecified: Secondary | ICD-10-CM | POA: Diagnosis not present

## 2024-11-06 DIAGNOSIS — N1831 Chronic kidney disease, stage 3a: Secondary | ICD-10-CM | POA: Diagnosis not present

## 2024-11-06 DIAGNOSIS — I5032 Chronic diastolic (congestive) heart failure: Secondary | ICD-10-CM | POA: Diagnosis not present

## 2024-11-06 DIAGNOSIS — I10 Essential (primary) hypertension: Secondary | ICD-10-CM | POA: Diagnosis not present

## 2024-11-06 DIAGNOSIS — I4891 Unspecified atrial fibrillation: Secondary | ICD-10-CM | POA: Diagnosis not present

## 2024-11-06 DIAGNOSIS — I679 Cerebrovascular disease, unspecified: Secondary | ICD-10-CM | POA: Diagnosis not present

## 2024-11-07 DIAGNOSIS — N1831 Chronic kidney disease, stage 3a: Secondary | ICD-10-CM | POA: Diagnosis not present

## 2024-11-07 DIAGNOSIS — I679 Cerebrovascular disease, unspecified: Secondary | ICD-10-CM | POA: Diagnosis not present

## 2024-11-07 DIAGNOSIS — E785 Hyperlipidemia, unspecified: Secondary | ICD-10-CM | POA: Diagnosis not present

## 2024-11-07 DIAGNOSIS — I4891 Unspecified atrial fibrillation: Secondary | ICD-10-CM | POA: Diagnosis not present

## 2024-11-07 DIAGNOSIS — I5032 Chronic diastolic (congestive) heart failure: Secondary | ICD-10-CM | POA: Diagnosis not present

## 2024-11-07 DIAGNOSIS — I10 Essential (primary) hypertension: Secondary | ICD-10-CM | POA: Diagnosis not present

## 2024-11-11 DIAGNOSIS — F321 Major depressive disorder, single episode, moderate: Secondary | ICD-10-CM | POA: Diagnosis not present

## 2024-11-11 DIAGNOSIS — I5032 Chronic diastolic (congestive) heart failure: Secondary | ICD-10-CM | POA: Diagnosis not present

## 2024-11-11 DIAGNOSIS — I679 Cerebrovascular disease, unspecified: Secondary | ICD-10-CM | POA: Diagnosis not present

## 2024-11-11 DIAGNOSIS — I10 Essential (primary) hypertension: Secondary | ICD-10-CM | POA: Diagnosis not present

## 2024-11-11 DIAGNOSIS — I4891 Unspecified atrial fibrillation: Secondary | ICD-10-CM | POA: Diagnosis not present

## 2024-11-11 DIAGNOSIS — N1831 Chronic kidney disease, stage 3a: Secondary | ICD-10-CM | POA: Diagnosis not present

## 2024-11-11 DIAGNOSIS — E785 Hyperlipidemia, unspecified: Secondary | ICD-10-CM | POA: Diagnosis not present

## 2024-11-12 DIAGNOSIS — I679 Cerebrovascular disease, unspecified: Secondary | ICD-10-CM | POA: Diagnosis not present

## 2024-11-12 DIAGNOSIS — I10 Essential (primary) hypertension: Secondary | ICD-10-CM | POA: Diagnosis not present

## 2024-11-12 DIAGNOSIS — I4891 Unspecified atrial fibrillation: Secondary | ICD-10-CM | POA: Diagnosis not present

## 2024-11-12 DIAGNOSIS — N1831 Chronic kidney disease, stage 3a: Secondary | ICD-10-CM | POA: Diagnosis not present

## 2024-11-12 DIAGNOSIS — I5032 Chronic diastolic (congestive) heart failure: Secondary | ICD-10-CM | POA: Diagnosis not present

## 2024-11-12 DIAGNOSIS — E785 Hyperlipidemia, unspecified: Secondary | ICD-10-CM | POA: Diagnosis not present

## 2024-11-14 DIAGNOSIS — N1831 Chronic kidney disease, stage 3a: Secondary | ICD-10-CM | POA: Diagnosis not present

## 2024-11-14 DIAGNOSIS — I10 Essential (primary) hypertension: Secondary | ICD-10-CM | POA: Diagnosis not present

## 2024-11-14 DIAGNOSIS — I5032 Chronic diastolic (congestive) heart failure: Secondary | ICD-10-CM | POA: Diagnosis not present

## 2024-11-14 DIAGNOSIS — E785 Hyperlipidemia, unspecified: Secondary | ICD-10-CM | POA: Diagnosis not present

## 2024-11-14 DIAGNOSIS — I4891 Unspecified atrial fibrillation: Secondary | ICD-10-CM | POA: Diagnosis not present

## 2024-11-14 DIAGNOSIS — I679 Cerebrovascular disease, unspecified: Secondary | ICD-10-CM | POA: Diagnosis not present

## 2024-11-16 DIAGNOSIS — I679 Cerebrovascular disease, unspecified: Secondary | ICD-10-CM | POA: Diagnosis not present

## 2024-11-16 DIAGNOSIS — N1831 Chronic kidney disease, stage 3a: Secondary | ICD-10-CM | POA: Diagnosis not present

## 2024-11-16 DIAGNOSIS — I4891 Unspecified atrial fibrillation: Secondary | ICD-10-CM | POA: Diagnosis not present

## 2024-11-16 DIAGNOSIS — I5032 Chronic diastolic (congestive) heart failure: Secondary | ICD-10-CM | POA: Diagnosis not present

## 2024-11-16 DIAGNOSIS — I10 Essential (primary) hypertension: Secondary | ICD-10-CM | POA: Diagnosis not present

## 2024-11-16 DIAGNOSIS — E785 Hyperlipidemia, unspecified: Secondary | ICD-10-CM | POA: Diagnosis not present

## 2024-11-20 ENCOUNTER — Non-Acute Institutional Stay (SKILLED_NURSING_FACILITY): Payer: Self-pay | Admitting: Nurse Practitioner

## 2024-11-20 ENCOUNTER — Encounter: Payer: Self-pay | Admitting: Nurse Practitioner

## 2024-11-20 DIAGNOSIS — F1320 Sedative, hypnotic or anxiolytic dependence, uncomplicated: Secondary | ICD-10-CM

## 2024-11-20 DIAGNOSIS — F331 Major depressive disorder, recurrent, moderate: Secondary | ICD-10-CM

## 2024-11-20 DIAGNOSIS — I4821 Permanent atrial fibrillation: Secondary | ICD-10-CM

## 2024-11-20 DIAGNOSIS — I69354 Hemiplegia and hemiparesis following cerebral infarction affecting left non-dominant side: Secondary | ICD-10-CM

## 2024-11-20 DIAGNOSIS — F03C4 Unspecified dementia, severe, with anxiety: Secondary | ICD-10-CM

## 2024-11-20 DIAGNOSIS — I10 Essential (primary) hypertension: Secondary | ICD-10-CM

## 2024-11-20 DIAGNOSIS — I5032 Chronic diastolic (congestive) heart failure: Secondary | ICD-10-CM

## 2024-11-20 NOTE — Progress Notes (Unsigned)
 Location:  Other [facility].  Nursing Home Room Number: [facility] [id number] Place of Service:  SNF [phone]) Harlene An, NP  PCP: Laurence Locus, DO  Patient Care Team: Laurence Locus, DO as PCP - General (Internal Medicine) Hobart Powell BRAVO, MD (Inactive) as PCP - Cardiology (Cardiology) Inocencio Soyla Lunger, MD as PCP - Electrophysiology (Cardiology)  Extended Emergency Contact Information Primary Emergency Contact: Viscardi,Danny Mobile Phone: [phone] Relation: Son Secondary Emergency Contact: Gordon,NORMA  [place] Home Phone: [phone] Relation: Daughter  Goals of care: Advanced Directive information    09/29/2024   12:23 PM  Advanced Directives  Does Patient Have a Medical Advance Directive? Yes  Type of Advance Directive Out of facility DNR (pink MOST or yellow form)  Does patient want to make changes to medical advance directive? No - Patient declined  Pre-existing out of facility DNR order (yellow form or pink MOST form) Yellow form placed in chart (order not valid for inpatient use)     Chief Complaint  Patient presents with   Medical Management of Chronic Issues    Medical Management of Chronic Issues.     HPI:  Pt is a 89 y.o. female seen today for medical management of chronic disease.    Past Medical History:  Diagnosis Date   Back pain    Bruises easily    C. difficile diarrhea 02/14/2024   Coronary artery disease    NONOBSTRUCTIVE   Fatigue    Hyperlipidemia    Hypertension    Knee pain    OA (osteoarthritis)    Obesity    Palpitations    SOB (shortness of breath)    Past Surgical History:  Procedure Laterality Date   BREAST BIOPSY     LEFT BREAST   CARDIAC CATHETERIZATION  05/11/2006   OVERALL CARDIAC SIZE AND SILHOUETTE ARE NORMAL. EF ESTIMATED 60%   INTRAMEDULLARY (IM) NAIL INTERTROCHANTERIC Left 03/12/2023   Procedure: INTRAMEDULLARY (IM) NAIL INTERTROCHANTERIC;  Surgeon: Kendal Franky SQUIBB, MD;  Location: [facility] OR;   Service: Orthopedics;  Laterality: Left;   KIDNEY STONES  1982   KNEE ARTHROSCOPY  09/22/2008   VAGINAL HYSTERECTOMY      Allergies  Allergen Reactions   Codeine Other (See Comments)    Unknown reaction   Demerol [Meperidine Hcl] Other (See Comments)    Unknown reaction   Morphine And Codeine Other (See Comments)    Unknown reaction    Outpatient Encounter Medications as of 11/20/2024  Medication Sig   ALPRAZolam  (XANAX ) 1 MG tablet Take 1 mg by mouth at bedtime.   apixaban  (ELIQUIS ) 5 MG TABS tablet Take 1 tablet (5 mg total) by mouth 2 (two) times daily.   Emollient (AQUAPHOR EX) Apply 1 Application topically as directed. Apply to R great toe wound topically every day and evening shift for wound care Clean area with NS or generic wound cleaner and pat dry. Apply aquaphor to nail bed and cover with bandaid. Change twice daily until resolve   famotidine  (PEPCID ) 20 MG tablet Take 20 mg by mouth at bedtime.   furosemide  (LASIX ) 40 MG tablet 40 mg tablet as needed for weight gain of 3 lbs overnight or 6 lbs in a week.   losartan  (COZAAR ) 50 MG tablet Take 50 mg by mouth daily.   metoprolol  tartrate (LOPRESSOR ) 50 MG tablet Take 50 mg by mouth 2 (two) times daily.   Nutritional Supplements (ENSURE CLEAR) LIQD Take by mouth daily.   Nutritional Supplements (PROSOURCE PLUS PO) Take 8  oz by mouth 2 (two) times daily. two times a day for wound healing Mix with 8 oz drink of choice   potassium chloride  SA (KLOR-CON  M) 20 MEQ tablet Give 20meq by mouth as needed for supplement with Lasix  as needed.   povidone-iodine  (BETADINE  SWABSTICKS) 10 % swab Apply 1 Application topically as directed. Apply to L heel DTI topically every day shift for wound care   sertraline (ZOLOFT) 100 MG tablet Take 150 mg by mouth at bedtime.   traMADol  (ULTRAM ) 50 MG tablet Take 1 tablet (50 mg total) by mouth every 6 (six) hours as needed. Give 2 tablets by mouth every 6 hours as needed for severe pain.   Zinc Oxide  (TRIPLE PASTE) 12.8 % ointment Apply 1 Application topically. To peri area and skin folds topically twice daily   No facility-administered encounter medications on file as of 11/20/2024.    Review of Systems ***  Immunization History  Administered Date(s) Administered   INFLUENZA, HIGH DOSE SEASONAL PF 11/23/2022   Influenza-Unspecified 10/10/2023, 09/30/2024   Moderna Covid-19 Vaccine Bivalent Booster 37yrs & up 12/19/2023   PFIZER(Purple Top)SARS-COV-2 Vaccination 01/07/2020, 01/27/2020, 11/18/2020   PNEUMOCOCCAL CONJUGATE-20 12/14/2023   Pertinent  Health Maintenance Due  Topic Date Due   Influenza Vaccine  Completed   Bone Density Scan  Discontinued      01/10/2024    1:26 PM  Fall Risk  Falls in the past year? 0  Was there an injury with Fall? 0   Fall Risk Category Calculator 0     Data saved with a previous flowsheet row definition   Functional Status Survey:    Vitals:   11/20/24 1309  BP: (!) 143/82  Pulse: 69  Resp: 16  Temp: 97.8 F (36.6 C)  SpO2: 98%  Weight: 159 lb 6.4 oz (72.3 kg)  Height: 5' 6 (1.676 m)   Body mass index is 25.73 kg/m. Physical Exam***  Labs reviewed: Recent Labs    12/11/23 1601 12/12/23 0604 12/12/23 0605 12/13/23 0943 12/20/23 0000 07/07/24 0000 07/10/24 0000 07/31/24 1459  NA 136  --  137 136   < > 140 136* 138  K 3.2*  --  3.2* 3.6   < > 3.8 3.9 4.1  CL 98  --  99 101   < > 100 105 107  CO2 26  --  27 26   < > 34* 23* 25*  GLUCOSE 139*  --  116* 145*  --   --   --   --   BUN 14  --  15 12   < > 19 18 12   CREATININE 0.73  --  0.91 0.69   < > 0.7 0.7 0.4*  CALCIUM  9.1  --  8.8* 8.9   < > 9.0 8.7 8.9  MG  --  2.0  --   --   --   --   --   --    < > = values in this interval not displayed.   Recent Labs    12/11/23 1601 12/20/23 0000 03/06/24 0000 04/18/24 0000 07/31/24 1459  AST 22 15 31 27 18   ALT 10 8 20 16 8   ALKPHOS 74 110 81 74 84  BILITOT 1.0  --   --   --   --   PROT 7.1  --   --   --   --    ALBUMIN 3.4* 3.1* 2.6* 2.8*  --    Recent Labs    12/11/23  1601 12/12/23 0605 12/20/23 0000 04/18/24 0000 07/07/24 0000 07/10/24 0000 07/31/24 1459  WBC 9.4 10.6*   < > 10.5 8.9 9.3 9.4  NEUTROABS 6.7  --    < > 6,983.00 4,948.00 5,896.00  --   HGB 14.2 13.7   < > 12.8 13.4 12.6 12.6  HCT 43.2 40.3   < > 38 42 39 39  MCV 92.9 89.6  --   --   --   --   --   PLT 347 342   < > 310 314 286 311   < > = values in this interval not displayed.   Lab Results  Component Value Date   TSH 4.35 08/27/2023   Lab Results  Component Value Date   HGBA1C 6.5 (H) 12/12/2023   Lab Results  Component Value Date   CHOL 185 12/12/2023   HDL 32 (L) 12/12/2023   LDLCALC 130 (H) 12/12/2023   TRIG 117 12/12/2023   CHOLHDL 5.8 12/12/2023    Significant Diagnostic Results in last 30 days:  No results found.  Assessment/Plan No problem-specific Assessment & Plan notes found for this encounter.     Einer Meals K. Caro BODILY [facility] [phone]

## 2024-11-26 DIAGNOSIS — F321 Major depressive disorder, single episode, moderate: Secondary | ICD-10-CM | POA: Diagnosis not present

## 2024-11-26 NOTE — Assessment & Plan Note (Signed)
 Stable on metoprolol  and continues on eliquis  for anticoagulation

## 2024-11-26 NOTE — Assessment & Plan Note (Signed)
 Euvolemic, continues on lasix  twice daily and metoprolol  at this time.  Weight stable. No LE edema.

## 2024-11-26 NOTE — Assessment & Plan Note (Signed)
 Stable on current dose of zoloft 100 mg daily, continue at this time.

## 2024-11-26 NOTE — Assessment & Plan Note (Signed)
 Unchanged, continue with supportive care

## 2024-11-26 NOTE — Assessment & Plan Note (Signed)
 Stable at this time, continues with elevation and lasix

## 2024-11-26 NOTE — Assessment & Plan Note (Signed)
 Blood pressure well controlled on metoprolol  and losartan  Continue current medications and dietary modifications follow metabolic panel.

## 2024-11-26 NOTE — Assessment & Plan Note (Signed)
 Ongoing, unable to tolerate wean. No worsening in anxiety noted. Continue current regimen.

## 2024-11-26 NOTE — Assessment & Plan Note (Signed)
 Colonized for c diff without active infection.  Probiotics and benfiber stopped without changes in stools.  Continue supportive care

## 2024-12-05 ENCOUNTER — Encounter: Payer: Self-pay | Admitting: Internal Medicine

## 2024-12-05 ENCOUNTER — Non-Acute Institutional Stay (SKILLED_NURSING_FACILITY): Payer: Self-pay | Admitting: Internal Medicine

## 2024-12-05 DIAGNOSIS — I5032 Chronic diastolic (congestive) heart failure: Secondary | ICD-10-CM | POA: Diagnosis not present

## 2024-12-05 DIAGNOSIS — I4821 Permanent atrial fibrillation: Secondary | ICD-10-CM

## 2024-12-05 DIAGNOSIS — F02B11 Dementia in other diseases classified elsewhere, moderate, with agitation: Secondary | ICD-10-CM

## 2024-12-05 DIAGNOSIS — I10 Essential (primary) hypertension: Secondary | ICD-10-CM | POA: Diagnosis not present

## 2024-12-05 DIAGNOSIS — Z515 Encounter for palliative care: Secondary | ICD-10-CM | POA: Diagnosis not present

## 2024-12-05 DIAGNOSIS — R600 Localized edema: Secondary | ICD-10-CM | POA: Diagnosis not present

## 2024-12-05 MED ORDER — FUROSEMIDE 20 MG PO TABS: 20.0000 mg | ORAL_TABLET | Freq: Every day | ORAL | Status: DC

## 2024-12-05 MED ORDER — HYDROCHLOROTHIAZIDE 25 MG PO TABS: 25.0000 mg | ORAL_TABLET | Freq: Every day | ORAL | Status: AC

## 2024-12-05 NOTE — Assessment & Plan Note (Signed)
 Hospice continues to follow her.  She is on Ultram  50 mg every 6 hours as needed for pain.

## 2024-12-05 NOTE — Assessment & Plan Note (Signed)
 Chronic.  She is on hospice care now.

## 2024-12-05 NOTE — Assessment & Plan Note (Addendum)
 She is on as needed Lasix .  Will add daily Lasix  20 mg for 3 days.  Add HCTZ 25 mg daily.

## 2024-12-05 NOTE — Progress Notes (Signed)
 [facility] Routine Care Progress Note    Location:  [place]  Nursing Home Room Number: Learta Beagle [id number] Place of Service:  SNF (31)   PCP: Laurence Locus, DO   Patient Care Team: Laurence Locus, DO as PCP - General (Internal Medicine) Hobart Powell BRAVO, MD (Inactive) as PCP - Cardiology (Cardiology) Inocencio Soyla Lunger, MD as PCP - Electrophysiology (Cardiology)   Extended Emergency Contact Information Primary Emergency Contact: Wien,Danny Mobile Phone: [phone] Relation: Son Secondary Emergency Contact: Gordon,NORMA  [place] Home Phone: [phone] Relation: Daughter   Goals of care: Advanced Directive information    12/05/2024   11:04 AM  Advanced Directives  Does Patient Have a Medical Advance Directive? Yes  Type of Advance Directive Out of facility DNR (pink MOST or yellow form)  Does patient want to make changes to medical advance directive? No - Patient declined     CODE STATUS: Do Not Resuscitate (DNR)   Chief Complaint  Patient presents with   Hypertension    Uncontrolled Hypertension.    Medical Management of Chronic Issues     HPI: Pt is a 89 y.o. female seen today for an acute visit for Uncontrolled Hypertension and chronic medical management.  Patient is a 89 year old female with a history of A-fib, hypertension, lower extremity edema, coronary disease.  She has had elevated systolic blood pressures as high as 200 mmHg.  Nursing is concerned about her blood pressures.  Patient is currently enrolled in hospice care.   Past Medical History:  Diagnosis Date   Back pain    Bruises easily    C. difficile diarrhea 02/14/2024   Coronary artery disease    NONOBSTRUCTIVE   Fatigue    Hyperlipidemia    Hypertension    Knee pain    OA (osteoarthritis)    Obesity    Palpitations    SOB (shortness of breath)    Past Surgical History:  Procedure Laterality Date   BREAST BIOPSY     LEFT BREAST   CARDIAC  CATHETERIZATION  05/11/2006   OVERALL CARDIAC SIZE AND SILHOUETTE ARE NORMAL. EF ESTIMATED 60%   INTRAMEDULLARY (IM) NAIL INTERTROCHANTERIC Left 03/12/2023   Procedure: INTRAMEDULLARY (IM) NAIL INTERTROCHANTERIC;  Surgeon: Kendal Franky SQUIBB, MD;  Location: [facility] OR;  Service: Orthopedics;  Laterality: Left;   KIDNEY STONES  1982   KNEE ARTHROSCOPY  09/22/2008   VAGINAL HYSTERECTOMY       Allergies[1]   Outpatient Encounter Medications as of 12/05/2024  Medication Sig   ALPRAZolam  (XANAX ) 1 MG tablet Take 1 mg by mouth at bedtime.   apixaban  (ELIQUIS ) 5 MG TABS tablet Take 1 tablet (5 mg total) by mouth 2 (two) times daily.   furosemide  (LASIX ) 20 MG tablet Take 1 tablet (20 mg total) by mouth daily.   furosemide  (LASIX ) 40 MG tablet 40 mg tablet as needed for weight gain of 3 lbs overnight or 6 lbs in a week.   hydrochlorothiazide (HYDRODIURIL) 25 MG tablet Take 1 tablet (25 mg total) by mouth daily.   losartan  (COZAAR ) 50 MG tablet Take 50 mg by mouth daily.   metoprolol  tartrate (LOPRESSOR ) 50 MG tablet Take 50 mg by mouth 2 (two) times daily.   Nutritional Supplements (PROSOURCE PLUS PO) Take 8 oz by mouth 2 (two) times daily. two times a day for wound healing Mix with 8 oz drink of choice   ondansetron  (ZOFRAN ) 4 MG tablet Take 4 mg by mouth every 8 (eight) hours as needed  for nausea or vomiting.   potassium chloride  SA (KLOR-CON  M) 20 MEQ tablet Give 20meq by mouth as needed for supplement with Lasix  as needed.   povidone-iodine  (BETADINE  SWABSTICKS) 10 % swab Apply 1 Application topically as directed. Apply to L heel DTI topically every day shift for wound care   sertraline (ZOLOFT) 100 MG tablet Take 150 mg by mouth at bedtime.   traMADol  (ULTRAM ) 50 MG tablet Take 1 tablet (50 mg total) by mouth every 6 (six) hours as needed. Give 2 tablets by mouth every 6 hours as needed for severe pain.   Zinc Oxide (TRIPLE PASTE) 12.8 % ointment Apply 1 Application topically. To peri area and skin folds  topically twice daily   No facility-administered encounter medications on file as of 12/05/2024.     Review of Systems  Unable to perform ROS: Dementia     Immunization History  Administered Date(s) Administered   INFLUENZA, HIGH DOSE SEASONAL PF 11/23/2022   Influenza-Unspecified 10/10/2023, 09/30/2024   Moderna Covid-19 Vaccine Bivalent Booster 74yrs & up 12/19/2023   PFIZER(Purple Top)SARS-COV-2 Vaccination 01/07/2020, 01/27/2020, 11/18/2020   PNEUMOCOCCAL CONJUGATE-20 12/14/2023   Pertinent  Health Maintenance Due  Topic Date Due   Influenza Vaccine  Completed   Bone Density Scan  Discontinued      01/10/2024    1:26 PM  Fall Risk  Falls in the past year? 0  Was there an injury with Fall? 0   Fall Risk Category Calculator 0     Data saved with a previous flowsheet row definition   Functional Status Survey:     Vitals:   12/05/24 1013  BP: (!) 164/76  Pulse: 70  Resp: 16  Temp: 97.6 F (36.4 C)  SpO2: 94%  Weight: 155 lb (70.3 kg)  Height: 5' 6 (1.676 m)   Body mass index is 25.02 kg/m. Physical Exam Vitals and nursing note reviewed.  Constitutional:      Comments: Elderly female.  No distress.  HENT:     Head: Normocephalic and atraumatic.  Cardiovascular:     Rate and Rhythm: Normal rate and regular rhythm.  Pulmonary:     Effort: Pulmonary effort is normal.     Breath sounds: Normal breath sounds.  Musculoskeletal:     Comments: Has bilateral heel protector boots on.  Trace to +1 bilateral pedal and ankle edema  Skin:    General: Skin is warm and dry.     Capillary Refill: Capillary refill takes less than 2 seconds.      Labs reviewed: Recent Labs    12/11/23 1601 12/12/23 0604 12/12/23 0605 12/13/23 0943 12/20/23 0000 07/07/24 0000 07/10/24 0000 07/31/24 1459  NA 136  --  137 136   < > 140 136* 138  K 3.2*  --  3.2* 3.6   < > 3.8 3.9 4.1  CL 98  --  99 101   < > 100 105 107  CO2 26  --  27 26   < > 34* 23* 25*  GLUCOSE 139*   --  116* 145*  --   --   --   --   BUN 14  --  15 12   < > 19 18 12   CREATININE 0.73  --  0.91 0.69   < > 0.7 0.7 0.4*  CALCIUM  9.1  --  8.8* 8.9   < > 9.0 8.7 8.9  MG  --  2.0  --   --   --   --   --   --    < > =  values in this interval not displayed.   Recent Labs    12/11/23 1601 12/20/23 0000 03/06/24 0000 04/18/24 0000 07/31/24 1459  AST 22 15 31 27 18   ALT 10 8 20 16 8   ALKPHOS 74 110 81 74 84  BILITOT 1.0  --   --   --   --   PROT 7.1  --   --   --   --   ALBUMIN 3.4* 3.1* 2.6* 2.8*  --    Recent Labs    12/11/23 1601 12/12/23 0605 12/20/23 0000 04/18/24 0000 07/07/24 0000 07/10/24 0000 07/31/24 1459  WBC 9.4 10.6*   < > 10.5 8.9 9.3 9.4  NEUTROABS 6.7  --    < > 6,983.00 4,948.00 5,896.00  --   HGB 14.2 13.7   < > 12.8 13.4 12.6 12.6  HCT 43.2 40.3   < > 38 42 39 39  MCV 92.9 89.6  --   --   --   --   --   PLT 347 342   < > 310 314 286 311   < > = values in this interval not displayed.   Lab Results  Component Value Date   TSH 4.35 08/27/2023   Lab Results  Component Value Date   HGBA1C 6.5 (H) 12/12/2023   Lab Results  Component Value Date   CHOL 185 12/12/2023   HDL 32 (L) 12/12/2023   LDLCALC 130 (H) 12/12/2023   TRIG 117 12/12/2023   CHOLHDL 5.8 12/12/2023   Assessment and Plan: Uncontrolled hypertension SBps are elevated as high as 202 mmHg.  Will start HCTZ 25 mg daily.  Also combine this with Lasix  20 mg daily for 3 days due to her persistent lower extremity edema.  Monitor blood pressures.  Continue on Cozaar  50 mg, Lopressor  50 mg twice daily    Permanent atrial fibrillation (HCC) Continue on metoprolol  50 mg twice daily along with Eliquis  5 mg twice daily  Hospice care Hospice continues to follow her.  She is on Ultram  50 mg every 6 hours as needed for pain.  Chronic heart failure with preserved ejection fraction (HFpEF) (HCC) She is on as needed Lasix .  Will add daily Lasix  20 mg for 3 days.  Add HCTZ 25 mg daily.  Moderate  dementia (HCC) Chronic.  She is on hospice care now.  Edema of left lower leg She still has left leg edema.  Will continue Lasix  40 mg daily for 3 days.  Add scheduled HCTZ 25 mg daily.   Meds ordered this encounter  Medications   hydrochlorothiazide (HYDRODIURIL) 25 MG tablet    Sig: Take 1 tablet (25 mg total) by mouth daily.   furosemide  (LASIX ) 20 MG tablet    Sig: Take 1 tablet (20 mg total) by mouth daily.     Camellia Door, DO  [facility] [phone]      [1]  Allergies Allergen Reactions   Codeine Other (See Comments)    Unknown reaction   Demerol [Meperidine Hcl] Other (See Comments)    Unknown reaction   Morphine And Codeine Other (See Comments)    Unknown reaction

## 2024-12-05 NOTE — Assessment & Plan Note (Signed)
 She still has left leg edema.  Will continue Lasix  40 mg daily for 3 days.  Add scheduled HCTZ 25 mg daily.

## 2024-12-05 NOTE — Assessment & Plan Note (Addendum)
 SBps are elevated as high as 202 mmHg.  Will start HCTZ 25 mg daily.  Also combine this with Lasix  20 mg daily for 3 days due to her persistent lower extremity edema.  Monitor blood pressures.  Continue on Cozaar  50 mg, Lopressor  50 mg twice daily

## 2024-12-05 NOTE — Assessment & Plan Note (Signed)
 Continue on metoprolol  50 mg twice daily along with Eliquis  5 mg twice daily

## 2024-12-30 ENCOUNTER — Other Ambulatory Visit: Payer: Self-pay | Admitting: Nurse Practitioner

## 2024-12-30 MED ORDER — MORPHINE SULFATE (CONCENTRATE) 20 MG/ML PO SOLN: 5.0000 mg | Freq: Two times a day (BID) | ORAL | 0 refills | Status: DC | PRN

## 2025-01-06 ENCOUNTER — Non-Acute Institutional Stay (SKILLED_NURSING_FACILITY): Payer: Self-pay | Admitting: Nurse Practitioner

## 2025-01-06 ENCOUNTER — Encounter: Payer: Self-pay | Admitting: Nurse Practitioner

## 2025-01-06 DIAGNOSIS — F02B11 Dementia in other diseases classified elsewhere, moderate, with agitation: Secondary | ICD-10-CM

## 2025-01-06 DIAGNOSIS — N1831 Chronic kidney disease, stage 3a: Secondary | ICD-10-CM

## 2025-01-06 DIAGNOSIS — I5032 Chronic diastolic (congestive) heart failure: Secondary | ICD-10-CM

## 2025-01-06 DIAGNOSIS — I69354 Hemiplegia and hemiparesis following cerebral infarction affecting left non-dominant side: Secondary | ICD-10-CM | POA: Diagnosis not present

## 2025-01-06 DIAGNOSIS — F03C4 Unspecified dementia, severe, with anxiety: Secondary | ICD-10-CM

## 2025-01-06 DIAGNOSIS — F331 Major depressive disorder, recurrent, moderate: Secondary | ICD-10-CM | POA: Diagnosis not present

## 2025-01-06 DIAGNOSIS — I10 Essential (primary) hypertension: Secondary | ICD-10-CM | POA: Diagnosis not present

## 2025-01-06 DIAGNOSIS — A0471 Enterocolitis due to Clostridium difficile, recurrent: Secondary | ICD-10-CM

## 2025-01-06 DIAGNOSIS — Z515 Encounter for palliative care: Secondary | ICD-10-CM

## 2025-01-06 DIAGNOSIS — F1320 Sedative, hypnotic or anxiolytic dependence, uncomplicated: Secondary | ICD-10-CM | POA: Diagnosis not present

## 2025-01-06 DIAGNOSIS — I872 Venous insufficiency (chronic) (peripheral): Secondary | ICD-10-CM

## 2025-01-06 DIAGNOSIS — I4821 Permanent atrial fibrillation: Secondary | ICD-10-CM

## 2025-01-06 MED ORDER — ALPRAZOLAM 1 MG PO TABS: 1.0000 mg | ORAL_TABLET | Freq: Every day | ORAL | 5 refills | Status: AC

## 2025-01-06 MED ORDER — TRAMADOL HCL 50 MG PO TABS: 50.0000 mg | ORAL_TABLET | Freq: Four times a day (QID) | ORAL | 0 refills | Status: AC | PRN

## 2025-01-06 NOTE — Assessment & Plan Note (Signed)
 Ongoing, unable to tolerate wean. No worsening in anxiety noted. Continue current regimen.

## 2025-01-06 NOTE — Assessment & Plan Note (Signed)
 Stable at this time, continues with elevation and lasix

## 2025-01-06 NOTE — Assessment & Plan Note (Addendum)
 Chronic and stable.

## 2025-01-06 NOTE — Assessment & Plan Note (Signed)
 Euvolemic She is on as needed Lasix  and now on HCTZ 25 mg daily.

## 2025-01-06 NOTE — Assessment & Plan Note (Signed)
 Stable on current dose of zoloft 100 mg daily, continue at this time.

## 2025-01-06 NOTE — Assessment & Plan Note (Signed)
 Unchanged, continue with supportive care

## 2025-01-06 NOTE — Progress Notes (Signed)
 " Location:  Other [place].  Nursing Home Room Number: Learta Beagle [id number] Place of Service:  SNF (31) Harlene Caro PIETY  PCP: Laurence Locus, DO  Patient Care Team: Laurence Locus, DO as PCP - General (Internal Medicine) Hobart Powell BRAVO, MD (Inactive) as PCP - Cardiology (Cardiology) Inocencio Soyla Lunger, MD as PCP - Electrophysiology (Cardiology)  Extended Emergency Contact Information Primary Emergency Contact: Goodgame,Danny Mobile Phone: [phone] Relation: Son Secondary Emergency Contact: Gordon,NORMA  [place] Home Phone: [phone] Relation: Daughter  Goals of care: Advanced Directive information    12/05/2024   11:04 AM  Advanced Directives  Does Patient Have a Medical Advance Directive? Yes  Type of Advance Directive Out of facility DNR (pink MOST or yellow form)  Does patient want to make changes to medical advance directive? No - Patient declined     Chief Complaint  Patient presents with   Medical Management of Chronic Issues    Medical Management of Chronic Issues.     HPI:  Pt is a 89 y.o. female seen today for medical management of chronic disease. Pt with hx of dementia, a fib, anxiety, htn, CHF.  She is on hospice services at this time She does not report pain at time of visit Nursing reports a DTI to her heal which occasionally will cause pain- giving PRN tramadol  with good effects.  Pt reports decrease appetite- she continues to lose weight.  Her dementia continues to progress.  She has no reports of increase in anxiety or depression No shortness of breath, chest pains or worsening LE edema.  Nursing has no acute concerns.     Past Medical History:  Diagnosis Date   Back pain    Bruises easily    C. difficile diarrhea 02/14/2024   Coronary artery disease    NONOBSTRUCTIVE   Fatigue    Hyperlipidemia    Hypertension    Knee pain    OA (osteoarthritis)    Obesity    Palpitations    SOB (shortness of breath)    Past  Surgical History:  Procedure Laterality Date   BREAST BIOPSY     LEFT BREAST   CARDIAC CATHETERIZATION  05/11/2006   OVERALL CARDIAC SIZE AND SILHOUETTE ARE NORMAL. EF ESTIMATED 60%   INTRAMEDULLARY (IM) NAIL INTERTROCHANTERIC Left 03/12/2023   Procedure: INTRAMEDULLARY (IM) NAIL INTERTROCHANTERIC;  Surgeon: Kendal Franky SQUIBB, MD;  Location: [facility] OR;  Service: Orthopedics;  Laterality: Left;   KIDNEY STONES  1982   KNEE ARTHROSCOPY  09/22/2008   VAGINAL HYSTERECTOMY      Allergies[1]  Outpatient Encounter Medications as of 01/06/2025  Medication Sig   ALPRAZolam  (XANAX ) 1 MG tablet Take 1 mg by mouth at bedtime.   apixaban  (ELIQUIS ) 5 MG TABS tablet Take 1 tablet (5 mg total) by mouth 2 (two) times daily.   cloNIDine (CATAPRES) 0.2 MG tablet Take 0.2 mg by mouth every 6 (six) hours as needed.   furosemide  (LASIX ) 40 MG tablet 40 mg tablet as needed for weight gain of 3 lbs overnight or 6 lbs in a week.   hydrochlorothiazide  (HYDRODIURIL ) 25 MG tablet Take 1 tablet (25 mg total) by mouth daily.   losartan  (COZAAR ) 50 MG tablet Take 50 mg by mouth daily.   metoprolol  tartrate (LOPRESSOR ) 50 MG tablet Take 50 mg by mouth 2 (two) times daily.   Nutritional Supplements (PROSOURCE PLUS PO) Take 8 oz by mouth 2 (two) times daily. two times a day for wound healing Mix with 8 oz  drink of choice   potassium chloride  SA (KLOR-CON  M) 20 MEQ tablet Give 20meq by mouth as needed for supplement with Lasix  as needed.   povidone-iodine  (BETADINE  SWABSTICKS) 10 % swab Apply 1 Application topically as directed. Apply to L heel DTI topically every day shift for wound care   sertraline (ZOLOFT) 100 MG tablet Take 150 mg by mouth at bedtime.   traMADol  (ULTRAM ) 50 MG tablet Take 1 tablet (50 mg total) by mouth every 6 (six) hours as needed. Give 2 tablets by mouth every 6 hours as needed for severe pain.   Zinc Oxide (TRIPLE PASTE) 12.8 % ointment Apply 1 Application topically. To peri area and skin folds topically  twice daily   furosemide  (LASIX ) 20 MG tablet Take 1 tablet (20 mg total) by mouth daily. (Patient not taking: Reported on 01/06/2025)   morphine  (ROXANOL) 20 MG/ML concentrated solution Take 0.25 mLs (5 mg total) by mouth every 12 (twelve) hours as needed for severe pain (pain score 7-10), shortness of breath, moderate pain (pain score 4-6), breakthrough pain or anxiety. (Patient not taking: Reported on 01/06/2025)   ondansetron  (ZOFRAN ) 4 MG tablet Take 4 mg by mouth every 8 (eight) hours as needed for nausea or vomiting. (Patient not taking: Reported on 01/06/2025)   No facility-administered encounter medications on file as of 01/06/2025.    Review of Systems  Constitutional:  Negative for chills and fever.  HENT:  Negative for tinnitus.   Respiratory:  Negative for cough and shortness of breath.   Cardiovascular:  Negative for chest pain, palpitations and leg swelling.  Gastrointestinal:  Negative for abdominal pain, constipation and diarrhea.  Genitourinary:  Negative for dysuria, frequency and urgency.  Musculoskeletal:  Negative for back pain and myalgias.  Skin: Negative.   Neurological:  Negative for dizziness and headaches.  Psychiatric/Behavioral:  Positive for confusion.      Immunization History  Administered Date(s) Administered   INFLUENZA, HIGH DOSE SEASONAL PF 11/23/2022   Influenza-Unspecified 10/10/2023, 09/30/2024   Moderna Covid-19 Vaccine  Bivalent Booster 36yrs & up 12/19/2023   PFIZER(Purple Top)SARS-COV-2 Vaccination 01/07/2020, 01/27/2020, 11/18/2020   PNEUMOCOCCAL CONJUGATE-20 12/14/2023   Pertinent  Health Maintenance Due  Topic Date Due   Influenza Vaccine  Completed   Bone Density Scan  Discontinued      01/10/2024    1:26 PM  Fall Risk  Falls in the past year? 0  Was there an injury with Fall? 0   Fall Risk Category Calculator 0     Data saved with a previous flowsheet row definition   Functional Status Survey:    Vitals:   01/06/25 1109  BP:  119/88  Pulse: 68  Resp: 20  Temp: (!) 97.3 F (36.3 C)  SpO2: 96%  Weight: 138 lb 3.2 oz (62.7 kg)  Height: 5' 6 (1.676 m)   Body mass index is 22.31 kg/m. Wt Readings from Last 3 Encounters:  01/06/25 138 lb 3.2 oz (62.7 kg)  12/05/24 155 lb (70.3 kg)  11/20/24 159 lb 6.4 oz (72.3 kg)   Physical Exam Constitutional:      General: She is not in acute distress.    Appearance: She is well-developed. She is not diaphoretic.  HENT:     Head: Normocephalic and atraumatic.     Mouth/Throat:     Pharynx: No oropharyngeal exudate.  Eyes:     Conjunctiva/sclera: Conjunctivae normal.     Pupils: Pupils are equal, round, and reactive to light.  Cardiovascular:  Rate and Rhythm: Normal rate and regular rhythm.     Heart sounds: Normal heart sounds.  Pulmonary:     Effort: Pulmonary effort is normal.     Breath sounds: Normal breath sounds.  Abdominal:     General: Bowel sounds are normal.     Palpations: Abdomen is soft.  Musculoskeletal:     Cervical back: Normal range of motion and neck supple.     Right lower leg: No edema.     Left lower leg: No edema.  Skin:    General: Skin is warm and dry.  Neurological:     Mental Status: She is alert.     Motor: Weakness present.     Gait: Gait abnormal.  Psychiatric:        Mood and Affect: Mood normal.     Labs reviewed: Recent Labs    07/07/24 0000 07/10/24 0000 07/31/24 1459  NA 140 136* 138  K 3.8 3.9 4.1  CL 100 105 107  CO2 34* 23* 25*  BUN 19 18 12   CREATININE 0.7 0.7 0.4*  CALCIUM  9.0 8.7 8.9   Recent Labs    03/06/24 0000 04/18/24 0000 07/31/24 1459  AST 31 27 18   ALT 20 16 8   ALKPHOS 81 74 84  ALBUMIN 2.6* 2.8*  --    Recent Labs    04/18/24 0000 07/07/24 0000 07/10/24 0000 07/31/24 1459  WBC 10.5 8.9 9.3 9.4  NEUTROABS 6,983.00 4,948.00 5,896.00  --   HGB 12.8 13.4 12.6 12.6  HCT 38 42 39 39  PLT 310 314 286 311   Lab Results  Component Value Date   TSH 4.35 08/27/2023   Lab  Results  Component Value Date   HGBA1C 6.5 (H) 12/12/2023   Lab Results  Component Value Date   CHOL 185 12/12/2023   HDL 32 (L) 12/12/2023   LDLCALC 130 (H) 12/12/2023   TRIG 117 12/12/2023   CHOLHDL 5.8 12/12/2023    Significant Diagnostic Results in last 30 days:  No results found.  Assessment/Plan Venous insufficiency of both lower extremities Stable at this time, continues with elevation and lasix    Stage 3a chronic kidney disease (CKD) (HCC) Chronic and stable.    Severe dementia with anxiety (HCC) Chronic.  She is on hospice care now. Continue supportive care Expect further weight loss due to progression of dementia.  Continues on ativan 1 mg at bedtime.  Permanent atrial fibrillation (HCC) Well controlled, Continue on metoprolol  50 mg twice daily along with Eliquis  5 mg twice daily  Moderate episode of recurrent major depressive disorder (HCC) Stable on current dose of zoloft 100 mg daily, continue at this time.    Hypertension Blood pressures with improved control at this time.  Will continue current regimen  Hospice care Hospice continues to follow her.  She is on Ultram  50 mg every 6 hours as needed for pain.  Hemiparesis of left nondominant side as late effect of cerebral infarction (HCC) Unchanged, continue with supportive care   Chronic heart failure with preserved ejection fraction (HFpEF) (HCC) Euvolemic She is on as needed Lasix  and now on HCTZ 25 mg daily.  Benzodiazepine dependence (HCC) Ongoing, unable to tolerate wean. No worsening in anxiety noted. Continue current regimen.   Recurrent Clostridium difficile diarrhea Colonized for c diff without active infection.  Continue supportive care     Harlene POUR. Caro BODILY [facility] [phone]       [1]  Allergies Allergen Reactions  Codeine Other (See Comments)    Unknown reaction   Demerol [Meperidine Hcl] Other (See Comments)    Unknown reaction    Morphine  And Codeine Other (See Comments)    Unknown reaction   "

## 2025-01-06 NOTE — Assessment & Plan Note (Signed)
 Well controlled, Continue on metoprolol  50 mg twice daily along with Eliquis  5 mg twice daily

## 2025-01-06 NOTE — Assessment & Plan Note (Signed)
 Blood pressures with improved control at this time.  Will continue current regimen

## 2025-01-06 NOTE — Assessment & Plan Note (Signed)
 Colonized for c diff without active infection.  Continue supportive care

## 2025-01-06 NOTE — Assessment & Plan Note (Signed)
 Hospice continues to follow her.  She is on Ultram  50 mg every 6 hours as needed for pain.

## 2025-01-06 NOTE — Assessment & Plan Note (Signed)
 Chronic.  She is on hospice care now. Continue supportive care Expect further weight loss due to progression of dementia.  Continues on ativan 1 mg at bedtime.
